# Patient Record
Sex: Female | Born: 1950 | Race: White | Hispanic: No | Smoking: Former smoker
Health system: Southern US, Community
[De-identification: ages and names within clinical notes are randomized; demographics above are authoritative.]

## PROBLEM LIST (undated history)

## (undated) DIAGNOSIS — E663 Overweight: Secondary | ICD-10-CM

## (undated) DIAGNOSIS — K3189 Other diseases of stomach and duodenum: Secondary | ICD-10-CM

## (undated) DIAGNOSIS — K219 Gastro-esophageal reflux disease without esophagitis: Secondary | ICD-10-CM

## (undated) DIAGNOSIS — I8390 Asymptomatic varicose veins of unspecified lower extremity: Secondary | ICD-10-CM

## (undated) DIAGNOSIS — R195 Other fecal abnormalities: Secondary | ICD-10-CM

## (undated) DIAGNOSIS — I219 Acute myocardial infarction, unspecified: Secondary | ICD-10-CM

## (undated) DIAGNOSIS — Z8742 Personal history of other diseases of the female genital tract: Secondary | ICD-10-CM

## (undated) DIAGNOSIS — T4145XA Adverse effect of unspecified anesthetic, initial encounter: Secondary | ICD-10-CM

## (undated) DIAGNOSIS — E785 Hyperlipidemia, unspecified: Secondary | ICD-10-CM

## (undated) DIAGNOSIS — IMO0002 Reserved for concepts with insufficient information to code with codable children: Secondary | ICD-10-CM

## (undated) DIAGNOSIS — K2970 Gastritis, unspecified, without bleeding: Secondary | ICD-10-CM

## (undated) DIAGNOSIS — M8580 Other specified disorders of bone density and structure, unspecified site: Secondary | ICD-10-CM

## (undated) DIAGNOSIS — T8859XA Other complications of anesthesia, initial encounter: Secondary | ICD-10-CM

## (undated) DIAGNOSIS — E079 Disorder of thyroid, unspecified: Secondary | ICD-10-CM

## (undated) DIAGNOSIS — H269 Unspecified cataract: Secondary | ICD-10-CM

## (undated) DIAGNOSIS — E119 Type 2 diabetes mellitus without complications: Secondary | ICD-10-CM

## (undated) DIAGNOSIS — E669 Obesity, unspecified: Secondary | ICD-10-CM

## (undated) DIAGNOSIS — M1990 Unspecified osteoarthritis, unspecified site: Secondary | ICD-10-CM

## (undated) DIAGNOSIS — D649 Anemia, unspecified: Secondary | ICD-10-CM

## (undated) DIAGNOSIS — I2510 Atherosclerotic heart disease of native coronary artery without angina pectoris: Secondary | ICD-10-CM

## (undated) HISTORY — DX: Personal history of other diseases of the female genital tract: Z87.42

## (undated) HISTORY — DX: Other specified disorders of bone density and structure, unspecified site: M85.80

## (undated) HISTORY — DX: Other diseases of stomach and duodenum: K31.89

## (undated) HISTORY — DX: Disorder of thyroid, unspecified: E07.9

## (undated) HISTORY — DX: Hyperlipidemia, unspecified: E78.5

## (undated) HISTORY — PX: UPPER GASTROINTESTINAL ENDOSCOPY: SHX188

## (undated) HISTORY — PX: ECTOPIC PREGNANCY SURGERY: SHX613

## (undated) HISTORY — DX: Overweight: E66.3

## (undated) HISTORY — PX: TONSILLECTOMY: SUR1361

## (undated) HISTORY — DX: Reserved for concepts with insufficient information to code with codable children: IMO0002

## (undated) HISTORY — PX: FIBULA FRACTURE SURGERY: SHX947

## (undated) HISTORY — DX: Gastritis, unspecified, without bleeding: K29.70

## (undated) HISTORY — PX: TUBAL LIGATION: SHX77

## (undated) HISTORY — PX: COLONOSCOPY: SHX174

## (undated) HISTORY — DX: Obesity, unspecified: E66.9

## (undated) HISTORY — PX: GANGLION CYST EXCISION: SHX1691

## (undated) HISTORY — PX: DILATION AND CURETTAGE OF UTERUS: SHX78

## (undated) HISTORY — DX: Asymptomatic varicose veins of unspecified lower extremity: I83.90

## (undated) HISTORY — DX: Acute myocardial infarction, unspecified: I21.9

## (undated) HISTORY — DX: Atherosclerotic heart disease of native coronary artery without angina pectoris: I25.10

## (undated) HISTORY — DX: Anemia, unspecified: D64.9

## (undated) HISTORY — DX: Unspecified osteoarthritis, unspecified site: M19.90

## (undated) HISTORY — DX: Gastro-esophageal reflux disease without esophagitis: K21.9

## (undated) HISTORY — DX: Type 2 diabetes mellitus without complications: E11.9

## (undated) HISTORY — DX: Other fecal abnormalities: R19.5

---
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
## 1998-06-13 ENCOUNTER — Other Ambulatory Visit: Admission: RE | Admit: 1998-06-13 | Discharge: 1998-06-13 | Payer: Self-pay | Admitting: Obstetrics & Gynecology

## 2001-06-24 ENCOUNTER — Encounter (HOSPITAL_COMMUNITY): Admission: RE | Admit: 2001-06-24 | Discharge: 2001-09-22 | Payer: Self-pay | Admitting: Cardiology

## 2001-09-23 ENCOUNTER — Encounter (HOSPITAL_COMMUNITY): Admission: RE | Admit: 2001-09-23 | Discharge: 2001-10-06 | Payer: Self-pay | Admitting: Cardiology

## 2001-10-07 ENCOUNTER — Encounter (HOSPITAL_COMMUNITY): Admission: RE | Admit: 2001-10-07 | Discharge: 2002-01-05 | Payer: Self-pay | Admitting: Cardiology

## 2002-02-05 HISTORY — PX: CLOSED REDUCTION PROXIMAL TIBIOFIBULAR JOINT DISLOCATON: SUR235

## 2002-03-11 ENCOUNTER — Inpatient Hospital Stay (HOSPITAL_COMMUNITY): Admission: EM | Admit: 2002-03-11 | Discharge: 2002-03-13 | Payer: Self-pay | Admitting: Emergency Medicine

## 2002-03-11 ENCOUNTER — Encounter: Payer: Self-pay | Admitting: Emergency Medicine

## 2002-03-11 ENCOUNTER — Encounter: Payer: Self-pay | Admitting: Cardiology

## 2002-03-11 ENCOUNTER — Encounter: Payer: Self-pay | Admitting: Orthopedic Surgery

## 2002-03-12 ENCOUNTER — Encounter: Payer: Self-pay | Admitting: Orthopedic Surgery

## 2003-04-03 ENCOUNTER — Emergency Department (HOSPITAL_COMMUNITY): Admission: EM | Admit: 2003-04-03 | Discharge: 2003-04-03 | Payer: Self-pay | Admitting: Emergency Medicine

## 2003-07-09 ENCOUNTER — Other Ambulatory Visit: Admission: RE | Admit: 2003-07-09 | Discharge: 2003-07-09 | Payer: Self-pay | Admitting: Family Medicine

## 2003-08-04 ENCOUNTER — Encounter: Admission: RE | Admit: 2003-08-04 | Discharge: 2003-11-02 | Payer: Self-pay | Admitting: Family Medicine

## 2004-09-15 ENCOUNTER — Encounter: Admission: RE | Admit: 2004-09-15 | Discharge: 2004-12-14 | Payer: Self-pay | Admitting: Family Medicine

## 2004-10-27 ENCOUNTER — Other Ambulatory Visit: Admission: RE | Admit: 2004-10-27 | Discharge: 2004-10-27 | Payer: Self-pay | Admitting: Family Medicine

## 2005-02-16 ENCOUNTER — Ambulatory Visit: Payer: Self-pay | Admitting: Cardiology

## 2005-02-28 ENCOUNTER — Encounter: Admission: RE | Admit: 2005-02-28 | Discharge: 2005-05-29 | Payer: Self-pay | Admitting: Family Medicine

## 2005-03-30 ENCOUNTER — Ambulatory Visit: Payer: Self-pay | Admitting: Cardiology

## 2005-04-20 ENCOUNTER — Ambulatory Visit: Payer: Self-pay

## 2005-06-28 ENCOUNTER — Encounter: Admission: RE | Admit: 2005-06-28 | Discharge: 2005-06-28 | Payer: Self-pay | Admitting: Family Medicine

## 2005-09-21 ENCOUNTER — Ambulatory Visit: Payer: Self-pay | Admitting: Cardiology

## 2005-09-28 ENCOUNTER — Encounter: Admission: RE | Admit: 2005-09-28 | Discharge: 2005-09-28 | Payer: Self-pay | Admitting: Family Medicine

## 2006-02-11 ENCOUNTER — Ambulatory Visit: Payer: Self-pay | Admitting: Cardiology

## 2007-02-21 ENCOUNTER — Ambulatory Visit: Payer: Self-pay | Admitting: Cardiology

## 2007-05-23 LAB — HM PAP SMEAR

## 2007-06-20 ENCOUNTER — Ambulatory Visit: Payer: Self-pay

## 2008-02-03 ENCOUNTER — Ambulatory Visit: Payer: Self-pay | Admitting: Cardiology

## 2008-02-03 LAB — CONVERTED CEMR LAB
ALT: 44 units/L — ABNORMAL HIGH (ref 0–35)
Albumin: 3.7 g/dL (ref 3.5–5.2)
BUN: 11 mg/dL (ref 6–23)
Basophils Absolute: 0 10*3/uL (ref 0.0–0.1)
Basophils Relative: 0.2 % (ref 0.0–3.0)
CO2: 30 meq/L (ref 19–32)
CRP, High Sensitivity: <1 — ABNORMAL LOW (ref 0.00–5.00)
Calcium: 9.6 mg/dL (ref 8.4–10.5)
Creatinine, Ser: 0.7 mg/dL (ref 0.4–1.2)
Eosinophils Absolute: 0.1 10*3/uL (ref 0.0–0.7)
Eosinophils Relative: 0.9 % (ref 0.0–5.0)
GFR calc non Af Amer: 92 mL/min
HCT: 39.4 % (ref 36.0–46.0)
Hemoglobin: 13.7 g/dL (ref 12.0–15.0)
MCHC: 34.9 g/dL (ref 30.0–36.0)
MCV: 93 fL (ref 78.0–100.0)
Neutro Abs: 3.8 10*3/uL (ref 1.4–7.7)
RBC: 4.23 M/uL (ref 3.87–5.11)
Total Bilirubin: 0.8 mg/dL (ref 0.3–1.2)
Total CK: 81 units/L (ref 7–177)

## 2008-04-14 DIAGNOSIS — E785 Hyperlipidemia, unspecified: Secondary | ICD-10-CM | POA: Insufficient documentation

## 2008-04-14 HISTORY — DX: Hyperlipidemia, unspecified: E78.5

## 2008-04-16 ENCOUNTER — Encounter: Payer: Self-pay | Admitting: Cardiology

## 2008-04-16 ENCOUNTER — Ambulatory Visit: Payer: Self-pay | Admitting: Cardiology

## 2008-04-30 ENCOUNTER — Ambulatory Visit: Payer: Self-pay | Admitting: Cardiology

## 2008-04-30 LAB — CONVERTED CEMR LAB
Albumin: 3.6 g/dL (ref 3.5–5.2)
Alkaline Phosphatase: 64 units/L (ref 39–117)
HDL: 35.5 mg/dL — ABNORMAL LOW (ref >39.00)
Total Protein: 6.6 g/dL (ref 6.0–8.3)
Triglycerides: 49 mg/dL (ref 0.0–149.0)
VLDL: 9.8 mg/dL (ref 0.0–40.0)

## 2008-07-23 ENCOUNTER — Encounter: Admission: RE | Admit: 2008-07-23 | Discharge: 2008-07-23 | Payer: Self-pay | Admitting: Family Medicine

## 2009-03-08 ENCOUNTER — Telehealth: Payer: Self-pay | Admitting: Cardiology

## 2009-03-08 ENCOUNTER — Encounter (INDEPENDENT_AMBULATORY_CARE_PROVIDER_SITE_OTHER): Payer: Self-pay | Admitting: *Deleted

## 2009-05-13 ENCOUNTER — Ambulatory Visit: Payer: Self-pay | Admitting: Cardiology

## 2009-05-13 DIAGNOSIS — I2510 Atherosclerotic heart disease of native coronary artery without angina pectoris: Secondary | ICD-10-CM

## 2009-05-13 DIAGNOSIS — E663 Overweight: Secondary | ICD-10-CM | POA: Insufficient documentation

## 2009-05-13 HISTORY — DX: Atherosclerotic heart disease of native coronary artery without angina pectoris: I25.10

## 2009-05-13 HISTORY — DX: Overweight: E66.3

## 2009-06-16 ENCOUNTER — Telehealth (INDEPENDENT_AMBULATORY_CARE_PROVIDER_SITE_OTHER): Payer: Self-pay

## 2009-06-17 ENCOUNTER — Encounter: Payer: Self-pay | Admitting: Cardiology

## 2009-06-20 ENCOUNTER — Ambulatory Visit: Payer: Self-pay

## 2009-06-20 ENCOUNTER — Encounter (HOSPITAL_COMMUNITY): Admission: RE | Admit: 2009-06-20 | Discharge: 2009-06-20 | Payer: Self-pay | Admitting: Cardiology

## 2009-06-20 ENCOUNTER — Ambulatory Visit: Payer: Self-pay | Admitting: Internal Medicine

## 2009-06-28 ENCOUNTER — Telehealth: Payer: Self-pay | Admitting: Cardiology

## 2009-12-09 ENCOUNTER — Ambulatory Visit: Payer: Self-pay | Admitting: Cardiology

## 2009-12-09 DIAGNOSIS — E119 Type 2 diabetes mellitus without complications: Secondary | ICD-10-CM

## 2009-12-09 HISTORY — DX: Type 2 diabetes mellitus without complications: E11.9

## 2009-12-12 ENCOUNTER — Telehealth: Payer: Self-pay | Admitting: Cardiology

## 2009-12-13 ENCOUNTER — Telehealth: Payer: Self-pay | Admitting: Cardiology

## 2009-12-15 ENCOUNTER — Telehealth: Payer: Self-pay | Admitting: Cardiology

## 2009-12-23 ENCOUNTER — Encounter (INDEPENDENT_AMBULATORY_CARE_PROVIDER_SITE_OTHER): Payer: Self-pay | Admitting: *Deleted

## 2010-01-18 ENCOUNTER — Telehealth: Payer: Self-pay | Admitting: Cardiology

## 2010-01-23 ENCOUNTER — Telehealth: Payer: Self-pay | Admitting: Internal Medicine

## 2010-02-05 DIAGNOSIS — K31A0 Gastric intestinal metaplasia, unspecified: Secondary | ICD-10-CM

## 2010-02-05 HISTORY — DX: Gastric intestinal metaplasia, unspecified: K31.A0

## 2010-02-23 ENCOUNTER — Encounter: Payer: Self-pay | Admitting: *Deleted

## 2010-02-24 DIAGNOSIS — D649 Anemia, unspecified: Secondary | ICD-10-CM | POA: Insufficient documentation

## 2010-02-24 DIAGNOSIS — K921 Melena: Secondary | ICD-10-CM | POA: Insufficient documentation

## 2010-02-24 DIAGNOSIS — Z8711 Personal history of peptic ulcer disease: Secondary | ICD-10-CM | POA: Insufficient documentation

## 2010-03-01 ENCOUNTER — Ambulatory Visit
Admission: RE | Admit: 2010-03-01 | Discharge: 2010-03-01 | Payer: Self-pay | Source: Home / Self Care | Attending: Internal Medicine | Admitting: Internal Medicine

## 2010-03-06 ENCOUNTER — Ambulatory Visit
Admission: RE | Admit: 2010-03-06 | Discharge: 2010-03-06 | Payer: Self-pay | Source: Home / Self Care | Attending: Internal Medicine | Admitting: Internal Medicine

## 2010-03-06 ENCOUNTER — Encounter: Payer: Self-pay | Admitting: Internal Medicine

## 2010-03-06 DIAGNOSIS — L709 Acne, unspecified: Secondary | ICD-10-CM | POA: Insufficient documentation

## 2010-03-06 DIAGNOSIS — L708 Other acne: Secondary | ICD-10-CM | POA: Insufficient documentation

## 2010-03-06 DIAGNOSIS — E663 Overweight: Secondary | ICD-10-CM

## 2010-03-06 DIAGNOSIS — K219 Gastro-esophageal reflux disease without esophagitis: Secondary | ICD-10-CM | POA: Insufficient documentation

## 2010-03-06 DIAGNOSIS — I2510 Atherosclerotic heart disease of native coronary artery without angina pectoris: Secondary | ICD-10-CM

## 2010-03-06 DIAGNOSIS — E119 Type 2 diabetes mellitus without complications: Secondary | ICD-10-CM

## 2010-03-06 DIAGNOSIS — E785 Hyperlipidemia, unspecified: Secondary | ICD-10-CM

## 2010-03-06 NOTE — Progress Notes (Signed)
  Subjective:    Patient ID: Morgan [name], female    DOB: [birth year], 60 y.o.   MRN: [id number] Pt comes in today to establish   .Marland Kitchen Last pcp was  Dr Larina Bras who recently left local practice and previously Dr Christ Kick. SHe is under the care of Dr Daleen Squibb for her CAD . She has diabetes well controlled  Elevated lipids.  No ongoing  cv symptoms but gets SBE prophylaxis .  And prn rx for adult acne.    Diabetes: She's been on medicine since 2005 based on the best with metformin and by 8. Has been able to lose weight and maintain control. I believe her last labs were in 6 months ago. She gets regular eye checks has no neuropathy but had some question of a numb toe on exam at one point. Coronary artery disease; MI at about age 40 drug alluding stent  up to date and no limitations or exercise-induced symptoms. Hyperlipidemia  on medications well controlled at goal without side effects lovaza is expensive and she was told she could take over-the-counter fish oil. Healthcare maintenance  she is unsure of her shots she hasn't had a Pap and a couple years has remote history of slightly abnormal cells. 1980 normal since then. HPI    Review of Systems  Constitutional: Negative.   HENT: Negative for hearing loss.   Eyes: Negative for visual disturbance.  Respiratory: Negative for cough, chest tightness, shortness of breath and wheezing.   Cardiovascular: Negative for chest pain, palpitations and leg swelling.  Gastrointestinal: Negative for abdominal pain.       Indigestion that she  Takes  tums qhs   To have endo soon.  Skin: Negative for rash.  Neurological: Negative for syncope and headaches.  Hematological: Negative for adenopathy. Does not bruise/bleed easily.       Objective:   Physical Exam  Constitutional: She is oriented to person, place, and time. She appears well-developed and well-nourished. No distress.  HENT:  Head: Normocephalic and atraumatic.  Left Ear: External ear normal.  Eyes:  Pupils are equal, round, and reactive to light.  Neck: Normal range of motion. Neck supple. Carotid bruit is not present. No mass and no thyromegaly present.  Cardiovascular: Normal rate, regular rhythm, normal heart sounds and intact distal pulses.  PMI is not displaced.  Exam reveals no gallop and no friction rub.   Pulmonary/Chest: Effort normal and breath sounds normal. She has no decreased breath sounds. She has no wheezes. She has no rhonchi. She has no rales.  Abdominal: Soft. Normal appearance and bowel sounds are normal. She exhibits no abdominal bruit and no mass. There is no splenomegaly or hepatomegaly. There is no tenderness.  Lymphadenopathy:    She has no cervical adenopathy.  Neurological: She is alert and oriented to person, place, and time. Coordination normal.  Skin: Skin is warm and dry.  Psychiatric: She has a normal mood and affect. Her behavior is normal.        Assessment & Plan:  Diabetes mellitus uncomplicated.2 Hyperlipidemia Coronary artery disease GE reflux disease Adult acne SBE prophylaxis   I'm unsure why she is on clindamycin before procedures and dental work. Family history of heart disease and diabetes.

## 2010-03-06 NOTE — Assessment & Plan Note (Signed)
Apparently well controlled on her current combination if she switches to over-the-counter fish oil she may need to take the equivalent of 8-12 a day GI side effects cautioned.

## 2010-03-06 NOTE — Assessment & Plan Note (Signed)
Apparently well-controlled on metformin and by age. There've been problems obtaining medication from her insurance and she has been paying out of pocket. Dr. wall and her previous doctors have tried to get this approved no avail. We'll write this prescription and see a prior authorization is helpful. She is apparently doing  well on this combination. She states she is up-to-date on her eye checks. We'll review the records she brought today to check other health maintenance issues.

## 2010-03-06 NOTE — Assessment & Plan Note (Signed)
Discussed symptoms Dr. Dickie La has apparently prescribed omeprazole but she hasn't had yet. She will be getting an endoscopy soon. Weight loss will help this problem.

## 2010-03-06 NOTE — Patient Instructions (Addendum)
Continue lifestyle intervention healthy eating and exercise .  Weight loss continued will help. Schedule for  CPX  With pap  and labs before  With this.   Include  Hg a1c and urine/ microalbumine /creatine  Ratio.   Take the omeprazole  As  Per  Dr Juanda Chance  .  Sometime tums causes rebound acid.  Get a mammogram . May need  A tdap   Pneumonia shot is recommended  for diabetics ad people with heart disease    Or at age 60. .      Will have to review the records she brought an updated her health care maintenance. We will order the Byetta and attempt to go through the process again and getting approval from her insurance. She is doing well and has lost a lot of weight it would be unfortunate adverse to her health and have to change from something that is working well.

## 2010-03-06 NOTE — Assessment & Plan Note (Signed)
Reviewed strategies.

## 2010-03-07 ENCOUNTER — Encounter: Payer: Self-pay | Admitting: Internal Medicine

## 2010-03-07 NOTE — Assessment & Plan Note (Signed)
Summary: WT 189/C R/S/WALL/414.01/UCH  PRC. REQ/SAF  Nuclear Med Background Indications for Stress Test: Evaluation for Ischemia, Stent Patency   History: Echo, Heart Catheterization, Myocardial Infarction, Myocardial Perfusion Study, Stents  History Comments: '03 IWMI>Cath>Stents- RCA x 3; '04 Echo: EF=55-65%; '09 MPS:no ischemia, EF=72%.   Symptoms Comments: No cardiac complaints.   Nuclear Pre-Procedure Cardiac Risk Factors: Family History - CAD, History of Smoking, Hypertension, Lipids, NIDDM, Obesity Caffeine/Decaff Intake: None NPO After: 7:00 PM Lungs: Clear.  O2 Sat 98% on RA IV 0.9% NS with Angio Cath: 22g     IV Site: (R) AC IV Started by: Burna Mortimer Deal RT-N Chest Size (in) 38     Cup Size D     Height (in): 66 Weight (lb): 186 BMI: 30.13  Nuclear Med Study 1 or 2 day study:  1 day     Stress Test Type:  Eugenie Birks Reading MD:  Dietrich Pates, MD     Referring MD:  Valera Castle, MD Resting Radionuclide:  Technetium 9m Tetrofosmin     Resting Radionuclide Dose:  10 mCi  Stress Radionuclide:  Technetium 84m Tetrofosmin     Stress Radionuclide Dose:  33 mCi   Stress Protocol   Lexiscan: 0.4 mg   Stress Test Technologist:  Rea [employer] CMA-N     Nuclear Technologist:  Domenic Polite CNMT  Rest Procedure  Myocardial perfusion imaging was performed at rest 45 minutes following the intravenous administration of Myoview Technetium 65m Tetrofosmin.  Stress Procedure  The patient received IV Lexiscan 0.4 mg over 15-seconds.  Myoview injected at 30-seconds.  There were no significant changes with lexiscan, other than a hypotensive response.  Quantitative spect images were obtained after a 45 minute delay.  QPS Raw Data Images:  Images were motion corrected.  Soft tissue (diaphragm, bowel activity) underlie heart. Stress Images:  Inferoseptal defect (base, mid).  Minimall apical thinning.    Ptjerwise normal perfusion. Rest Images:  Minimal improvement in the inferoseptal  region. Transient Ischemic Dilatation:  1.09  (Normal <1.22)  Lung/Heart Ratio:  .47  (Normal <0.45)  Quantitative Gated Spect Images QGS EDV:  97 ml QGS ESV:  27 ml QGS EF:  73 %   Overall Impression  Exercise Capacity: Lexiscan protocol. BP Response: Normal blood pressure response. Clinical Symptoms: Chest tightness. ECG Impression: No significant ST segment change suggestive of ischemia. Overall Impression Comments: Inferoseptal defect (base, mid) that improves minimally consistent with scar and very mild periinfarct ischemia.  LVEF 73% with normal wall thickening.  Appended Document: WT 189/C R/S/WALL/414.01/UCH  PRC. REQ/SAF Excellent results...no change in treatment.  Appended Document: WT 189/C R/S/WALL/414.01/UCH  PRC. REQ/SAF LMTCB./CY

## 2010-03-07 NOTE — Progress Notes (Signed)
Summary: pt wants to know if meds were approved.   Phone Note Call from Patient Call back at Home Phone [phone]   Caller: Patient Reason for Call: Talk to Nurse Summary of Call: pt wants to know if meds were approved. pt has not receive her meds. Initial call taken by: Roe Coombs,  December 15, 2009 9:14 AM  Follow-up for Phone Call        Pt aware byetta approval is pending per [employer].  She is very appreciative of our efforts. Mylo Red RN     Appended Document: pt wants to know if meds were approved. thanks for working on this so hard.  Reviewed Juanito Doom, MD

## 2010-03-07 NOTE — Progress Notes (Signed)
Summary: re pre auth    Phone Note Call from Patient   Caller: Patient [phone]  ok to leave msg Reason for Call: Talk to Nurse Summary of Call: byatta pre auth ? Initial call taken by: Glynda Jaeger,  December 12, 2009 1:45 PM  Follow-up for Phone Call        Form faxed to [employer].  Awaiting preauthorization. Pt is aware. Mylo Red RN

## 2010-03-07 NOTE — Letter (Signed)
Summary: New Patient letter  [facility]  [address] [place], [place] [address]   Phone: [phone]  Fax: [phone]       12/23/2009 MRN: [id number]  Morgan [name] [address] [place], [place]  [address]  Dear Morgan [name],  Welcome to the Gastroenterology Division at [facility].    You are scheduled to see Dr. Juanda Chance on 03/01/2010 at 9:15AM on the 3rd floor at [facility], [address]  We ask that you try to arrive at our office 15 minutes prior to your appointment time to allow for check-in.  We would like you to complete the enclosed self-administered evaluation form prior to your visit and bring it with you on the day of your appointment.  We will review it with you.  Also, please bring a complete list of all your medications or, if you prefer, bring the medication bottles and we will list them.  Please bring your insurance card so that we may make a copy of it.  If your insurance requires a referral to see a specialist, please bring your referral form from your primary care physician.  Co-payments are due at the time of your visit and may be paid by cash, check or credit card.     Your office visit will consist of a consult with your physician (includes a physical exam), any laboratory testing he/she may order, scheduling of any necessary diagnostic testing (e.g. x-ray, ultrasound, CT-scan), and scheduling of a procedure (e.g. Endoscopy, Colonoscopy) if required.  Please allow enough time on your schedule to allow for any/all of these possibilities.    If you cannot keep your appointment, please call [phone] to cancel or reschedule prior to your appointment date.  This allows [place] the opportunity to schedule an appointment for another patient in need of care.  If you do not cancel or reschedule by 5 p.m. the business day prior to your appointment date, you will be charged a $50.00 late cancellation/no-show fee.    Thank you for  choosing [facility] for your medical needs.  We appreciate the opportunity to care for you.  Please visit [place] at our website  to learn more about our practice.                     Sincerely,                                                             The Gastroenterology Division

## 2010-03-07 NOTE — Assessment & Plan Note (Signed)
Summary: rov  Medications Added VITAMIN D 1000 UNIT  TABS (CHOLECALCIFEROL) 2 by mouth daily MULTIVITAMINS   TABS (MULTIPLE VITAMIN) 1 by mouth daily      Allergies Added:    Primary Provider:  Ace Gins   History of Present Illness: Morgan [name] comes in today for followup of her coronary artery disease and risk factors. She continues to do remarkably well keep her weight off. She is exercising on a regular basis. She is having no angina or ischemic symptoms. Laboratory data from May for primary care reviewed. Lipids are at goal.  She's having a difficult time getting authorization for her Byetta, Lipitor, and Lovasa. We will try today to work with [employer] to accomplish this. They have made a big difference in her numbers. The Byetta also has help her lose significant weight.  The last stress test was in May of this year. EF was 73% with minimal inferior septal scar. Question of mild peri-infarct ischemia. Low risk study.  Clinical Reports Reviewed:  Nuclear Study:  06/20/2007:  Exercise capacity -  Adenosine with low level exercise  Blood Pressure -   Clinical Symptoms - No chest pain or dyspnea  ECG impression - No significant ST segment change suggestive or ischemia  Overall impression -  Probable normal perfusion and mild soft tissue attenuation (breast). No evidence of significant ischemia.  04/20/2005:  Exercise capacity -  Blood Pressure -  Clinical Symptoms -   ECG impression - No diagnostic ST chagnes to suggest ischemia by standard criteria  Overall impression -  normal stress nuclear study.   Current Medications (verified): 1)  Aspirin 81 Mg Tbec (Aspirin) .... Take One Tablet By Mouth Daily 2)  Zetia 10 Mg Tabs (Ezetimibe) .... Take One Tablet By Mouth Daily. 3)  Lipitor 40 Mg Tabs (Atorvastatin Calcium) .... Take One Tablet By Mouth Daily. 4)  Byetta 10 Mcg Pen 10 Mcg/0.59ml Soln (Exenatide) .... Once Daily 5)  Minocin 100 Mg Caps (Minocycline Hcl) ....  As Needed 6)  Metformin Hcl 500 Mg Tabs (Metformin Hcl) .... 2 Tabs Bid 7)  Lovaza 1 Gm Caps (Omega-3-Acid Ethyl Esters) .... 2 Tab Two Times A Day 8)  Vitamin D 1000 Unit  Tabs (Cholecalciferol) .... 2 By Mouth Daily 9)  Multivitamins   Tabs (Multiple Vitamin) .Marland Kitchen.. 1 By Mouth Daily  Allergies (verified): 1)  ! Pcn  Past History:  Past Medical History: Last updated: 05/13/2009 CAD, NATIVE VESSEL (ICD-414.01) HYPERLIPIDEMIA-MIXED (ICD-272.4) OBESITY-MORBID (>100') (ICD-278.01)  Past Surgical History: Last updated: 04/14/2008 D&C Displaced left fibula fracture (distal) with  lateral subluxation of talus -- 03/2002 C-section (2) (1) tube tied  Family History: Last updated: 04/14/2008 Family History of Alcoholism:  Family History of Cancer:  Family History of Coronary Artery Disease:  Family History of Diabetes:  Family History of Hypertension:   Social History: Last updated: 04/14/2008 Full Time Married  Tobacco Use - Former.  Alcohol Use - no Drug Use - no  Risk Factors: Smoking Status: quit (04/14/2008)  Review of Systems       negative history of present illness  Vital Signs:  Patient profile:   60 year old female Height:      66 inches Weight:      193 pounds BMI:     31.26 Pulse rate:   69 / minute Resp:     16 per minute BP sitting:   119 / 68  (right arm)  Vitals Entered By: Marrion Coy, CNA (December 09, 2009  9:43 AM)  Physical Exam  General:  obese.   Head:  normocephalic and atraumatic Eyes:  PERRLA/EOM intact; conjunctiva and lids normal. Neck:  Neck supple, no JVD. No masses, thyromegaly or abnormal cervical nodes. Lungs:  Clear bilaterally to auscultation and percussion. Heart:  regular rate and rhythm, normal S1-S2, no bruits Msk:  Back normal, normal gait. Muscle strength and tone normal. Pulses:  pulses normal in all 4 extremities Extremities:  No clubbing or cyanosis. Neurologic:  Alert and oriented x 3. Skin:  Intact without  lesions or rashes. Psych:  Normal affect.   Impression & Recommendations:  Problem # 1:  CAD, NATIVE VESSEL (ICD-414.01) Assessment Unchanged  Her updated medication list for this problem includes:    Aspirin 81 Mg Tbec (Aspirin) .Marland Kitchen... Take one tablet by mouth daily  Problem # 2:  HYPERLIPIDEMIA-MIXED (ICD-272.4) Assessment: Improved  Her updated medication list for this problem includes:    Zetia 10 Mg Tabs (Ezetimibe) .Marland Kitchen... Take one tablet by mouth daily.    Lipitor 40 Mg Tabs (Atorvastatin calcium) .Marland Kitchen... Take one tablet by mouth daily.    Lovaza 1 Gm Caps (Omega-3-acid ethyl esters) .Marland Kitchen... 2 tab two times a day  Problem # 3:  OVERWEIGHT/OBESITY (ICD-278.02) Assessment: Improved  Problem # 4:  DIABETES MELLITUS, TYPE II (ICD-250.00) Assessment: Improved  Her updated medication list for this problem includes:    Aspirin 81 Mg Tbec (Aspirin) .Marland Kitchen... Take one tablet by mouth daily    Byetta 10 Mcg Pen 10 Mcg/0.75ml Soln (Exenatide) ..... Once daily    Metformin Hcl 500 Mg Tabs (Metformin hcl) .Marland Kitchen... 2 tabs bid  Patient Instructions: 1)  Your physician recommends that you schedule a follow-up appointment in: May 2012 with Dr. Daleen Squibb 2)  Your physician recommends that you continue on your current medications as directed. Please refer to the Current Medication list given to you today.

## 2010-03-07 NOTE — Progress Notes (Signed)
Summary: Nuc. Pre-Procedure  Phone Note Outgoing Call Call back at [facility] Phone [phone]   Call placed by: Irean Hong, RN,  Jun 16, 2009 10:50 AM Summary of Call: Left message with information on Myoview Information Sheet (see scanned document for details).      Nuclear Med Background Indications for Stress Test: Evaluation for Ischemia   History: Echo, Heart Catheterization, Myocardial Infarction, Myocardial Perfusion Study, Stents  History Comments: '03 IWMI> Cath> Stents RCA x3. '04 Echo: EF=55-65%. 5/09 MPS: (-) ischemia, EF=72%.     Nuclear Pre-Procedure Cardiac Risk Factors: Family History - CAD, History of Smoking, Hypertension, Lipids, NIDDM, Obesity Height (in): 66

## 2010-03-07 NOTE — Miscellaneous (Signed)
Summary: update med  Clinical Lists Changes  Medications: Removed medication of FOLIC ACID   POWD (FOLIC ACID) once daily Added new medication of FOLIC ACID 1 MG TABS (FOLIC ACID) Take 1 tablet by mouth once a day

## 2010-03-07 NOTE — Assessment & Plan Note (Signed)
Summary: YEARLY/SL  Medications Added LOVAZA 1 GM CAPS (OMEGA-3-ACID ETHYL ESTERS) 2 tab two times a day FOLTX 2.5-25-2 MG TABS (FA-PYRIDOXINE-CYANCOBALAMIN) 1 tab once daily        Visit Morgan:  1 yr f/u Primary Provider:  Ace Gins  CC:  pt has lost 15 lb since last year due to being on Byetta for DM...edema/left ankle due to previous break..denies any cp or sob.  Morgan of Present Illness: Mrs. Morgan [name], Morgan [name], Morgan [name], Morgan [name], Morgan hypertension.  She has lost from 232 pounds now at 189. She says it is the Byetta.   She is now followed by Dr. Larina Bras. We receive labs which I reviewed with her today. Her total cholesterol 227, triglycerides 55, HDL is up to 52, LDL 64, thyroid profile normal, CBC normal, vitamin D level normal, blood sugar 100 the hemoglobin A1c is 6.0. LFTs are normal.  She's having no symptoms of angina or ischemia. She had her initial infarct back in 2004, she had no symptoms.  She denies orthopnea, PND or palpitations. She does not exercise on a regular basis but is very compliant with her medications.  Current Medications (verified): 1)  Aspirin 81 Mg Tbec (Aspirin) .... Take One Tablet By Mouth Daily 2)  Zetia 10 Mg Tabs (Ezetimibe) .... Take One Tablet By Mouth Daily. 3)  Lipitor 40 Mg Tabs (Atorvastatin Calcium) .... Take One Tablet By Mouth Daily. 4)  Byetta 10 Mcg Pen 10 Mcg/0.56ml Soln (Exenatide) .... Once Daily 5)  Minocin 100 Mg Caps (Minocycline Hcl) .... As Needed 6)  Metformin Hcl 500 Mg Tabs (Metformin Hcl) .... 2 Tabs Bid 7)  Lovaza 1 Gm Caps (Omega-3-Acid Ethyl Esters) .... 2 Tab Two Times A Day 8)  Vitamin D 1000 Unit  Tabs (Cholecalciferol) .Marland Kitchen.. 1 Tab Once Daily  Allergies: 1)  ! Pcn  Past Morgan:  Past Medical Morgan: Last updated: 05/13/2009 CAD, NATIVE VESSEL (ICD-414.01) [name]-MIXED (ICD-272.4) Morgan [name]-MORBID (>100')  (ICD-278.01)  Past Surgical Morgan: Last updated: 04/14/2008 D&C Displaced left fibula fracture (distal) with  lateral subluxation of talus -- 03/2002 C-section (2) (1) tube tied  Family Morgan: Last updated: 04/14/2008 Family Morgan of Alcoholism:  Family Morgan of Cancer:  Family Morgan of Coronary Artery [name]:  Family Morgan of [name]:  Family Morgan of Hypertension:   Social Morgan: Last updated: 04/14/2008 Full Time Married  Tobacco Use - Former.  Alcohol Use - no Drug Use - no  Risk Factors: Smoking Status: quit (04/14/2008)  Review of Systems       negative Morgan of present illness  Vital Signs:  Patient profile:   60 year old female Height:      66 inches Weight:      189 pounds BMI:     30.62 Pulse rate:   63 / minute Pulse rhythm:   irregular BP sitting:   118 / 60  (left arm) Cuff size:   large  Vitals Entered By: Danielle Rankin, CMA (May 13, 2009 9:33 AM)  Physical Exam  General:  Well developed, well nourished, in no acute distress.significant weight loss obvious Head:  normocephalic Morgan atraumatic Eyes:  PERRLA/EOM intact; conjunctiva Morgan lids normal. Mouth:  Teeth, gums Morgan palate normal. Oral mucosa normal. Neck:  Neck supple, no JVD. No masses, thyromegaly or abnormal cervical nodes. Chest Earla Charlie:  no deformities or breast masses noted Lungs:  Clear bilaterally to auscultation Morgan percussion. Heart:  Non-displaced PMI,  chest non-tender; regular rate Morgan rhythm, S1, S2 without murmurs, rubs or gallops. Carotid upstroke normal, no bruit. Normal abdominal aortic size, no bruits. Femorals normal pulses, no bruits. Pedals normal pulses. No edema, no varicosities. Abdomen:  Bowel sounds positive; abdomen soft Morgan non-tender without masses, organomegaly, or hernias noted. No hepatosplenomegaly. Msk:  Back normal, normal gait. Muscle strength Morgan tone normal. Pulses:  pulses normal in all 4 extremities Extremities:  No clubbing or  cyanosis. Neurologic:  Alert Morgan oriented x 3. Skin:  Intact without lesions or rashes. Psych:  Normal affect.   Problems:  Medical Problems Added: 1)  Dx of Overweight/Morgan [name]  (ICD-278.02)  EKG  Procedure date:  05/13/2009  Findings:      normal sinus rhythm, first degree AV block no change.  Impression & Recommendations:  Problem # 1:  CAD, NATIVE VESSEL (ICD-414.01)  She is doing remarkably well. We will obtain a stress Myoview since she had no symptoms prior to her MI. No change in treatment except stop folic acid. Her updated medication list for this problem includes:    Aspirin 81 Mg Tbec (Aspirin) .Marland Kitchen... Take one tablet by mouth daily  Orders: EKG w/ Interpretation (93000) Nuclear Stress Test (Nuc Stress Test)  Problem # 2:  [name]-MIXED (ICD-272.4) Assessment: Improved I reviewed her labs with her. She has excellent values. No change. Her updated medication list for this problem includes:    Zetia 10 Mg Tabs (Ezetimibe) .Marland Kitchen... Take one tablet by mouth daily.    Lipitor 40 Mg Tabs (Atorvastatin calcium) .Marland Kitchen... Take one tablet by mouth daily.    Lovaza 1 Gm Caps (Omega-3-acid ethyl esters) .Marland Kitchen... 2 tab two times a day  Problem # 3:  OVERWEIGHT/Morgan [name] (ICD-278.02) Assessment: Improved She has lost from 232-189. This is remarkable. Her numbers including her blood pressure Morgan hemoglobin A1c reflect this. Maintenance reinforced.  Problem # 4:  OVERWEIGHT/Morgan [name] (ICD-278.02)  Patient Instructions: 1)  Your physician recommends that you schedule a follow-up appointment in: YEAR WITH DR Everlee Quakenbush 2)  Your physician has recommended you make the following change in your medication: STOP FOLIC ACID 3)  Your physician has requested that you have an exercise stress myoview.  For further information please visit [email or web address]  Please follow instruction sheet, as given. Prescriptions: FOLTX 2.5-25-2 MG TABS (FA-PYRIDOXINE-CYANCOBALAMIN) 1 tab once daily  #9i0 x 3    Entered by:   Danielle Rankin, CMA   Authorized by:   Gaylord Shih, MD, [facility]   Signed by:   Danielle Rankin, CMA on 05/13/2009   Method used:   Faxed to ...       [facility] (mail-order)             , [place]         Ph:        Fax: [phone]   RxID:   [id number]

## 2010-03-07 NOTE — Progress Notes (Signed)
Summary: test results   Phone Note Call from Patient Call back at Home Phone [phone]   Caller: Patient Reason for Call: Talk to Nurse, Insurance Question Details for Reason: stress test. o.k. to leave message on voice mail Initial call taken by: Lorne Skeens,  Jun 28, 2009 9:05 AM  Follow-up for Phone Call        I left a detailed message on Dr Gordy Councilman voicemail in regards to her myoview results.   Follow-up by: Julieta Gutting, RN, BSN,  Jun 28, 2009 9:14 AM

## 2010-03-07 NOTE — Progress Notes (Signed)
Summary: refill   Phone Note Refill Request   Refills Requested: Medication #1:  ZETIA 10 MG TABS Take one tablet by mouth daily.   Supply Requested: 3 months  Medication #2:  LIPITOR 40 MG TABS Take one tablet by mouth daily.   Supply Requested: 3 months  Medication #3:  FOLIC ACID   POWD once daily   Supply Requested: 3 months  Medication #4:  LOVAZA 1 GM CAPS 2 tab qd   Supply Requested: 3 months [facility]   Method Requested: Fax to [facility] Initial call taken by: Migdalia Dk,  March 08, 2009 8:45 AM  Follow-up for Phone Call        Rx faxed to pharmacy Follow-up by: Vikki Ports,  March 08, 2009 8:57 AM    Prescriptions: LOVAZA 1 GM CAPS (OMEGA-3-ACID ETHYL ESTERS) 2 tab qd  #180 x 3   Entered by:   Vikki Ports   Authorized by:   Gaylord Shih, MD, [facility]   Signed by:   Vikki Ports on 03/08/2009   Method used:   Faxed to ...       [facility] (mail-order)             , [place]         Ph:        Fax: [phone]   RxID:   [id number] FOLIC ACID   POWD (FOLIC ACID) once daily  #90 x 3   Entered by:   Vikki Ports   Authorized by:   Gaylord Shih, MD, [facility]   Signed by:   Vikki Ports on 03/08/2009   Method used:   Faxed to ...       [facility] (mail-order)             , [place]         Ph:        Fax: [phone]   RxID:   [id number] LIPITOR 40 MG TABS (ATORVASTATIN CALCIUM) Take one tablet by mouth daily.  #90 x 3   Entered by:   Vikki Ports   Authorized by:   Gaylord Shih, MD, [facility]   Signed by:   Vikki Ports on 03/08/2009   Method used:   Faxed to ...       [facility] (mail-order)             , [place]         Ph:        Fax: [phone]   RxID:   [id number] ZETIA 10 MG TABS (EZETIMIBE) Take one tablet by mouth daily.  #90 x 3   Entered by:   Vikki Ports   Authorized by:   Gaylord Shih, MD, [facility]   Signed by:   Vikki Ports on 03/08/2009   Method used:   Faxed to ...       [facility] (mail-order)          , [place]         Ph:        Fax: [phone]   RxID:   [id number]

## 2010-03-07 NOTE — Progress Notes (Signed)
Summary: calling about meds   Phone Note Call from Patient Call back at Home Phone [phone]   Caller: Patient Summary of Call: pt calling regarding her medication Initial call taken by: Judie Grieve,  December 13, 2009 1:37 PM  Follow-up for Phone Call        pt calling back re problem wiht medication-pls call [phone] Glynda Jaeger  December 14, 2009 10:00 AM adv pt will try and track down info regarding her byatta. Claris Gladden, RN, BSN Follow-up by: Claris Gladden RN,  December 14, 2009 10:53 AM  Additional Follow-up for Phone Call Additional follow up Details #1::        spoke with [facility] health care regarding [facility] need medical records, A1c, and information about pt trying other DM meds. Left message for pt to call and I will provide info we have to [place].  Additional Follow-up by: Claris Gladden RN,  December 14, 2009 12:28 PM    Additional Follow-up for Phone Call Additional follow up Details #2::    12/14/09 1314 pt will call her Family Practice (Dr. Ace Gins) to contact us to discuss info needed. Per pt she has been on this med for 3 years and because of insurance change in July this has caused the problem in getting med fulfilled.   12/14/09 1327 spoke w/Angie at Dr. Hayden Rasmussen office and she will fax the info that is needed to get the authorization to [place] today.  Follow-up by: Claris Gladden RN,  December 14, 2009 1:30 PM  Additional Follow-up for Phone Call Additional follow up Details #3:: Details for Additional Follow-up Action Taken: LM on personal machine regarding refaxing forms and speaking with Tedd Sias again on Monday twice. Mylo Red RN

## 2010-03-09 NOTE — Assessment & Plan Note (Addendum)
Summary: BLOOD IN STOOL X 1 WEEK...AS.    History of Present Illness Visit Type: Initial Consult Primary GI MD: [name] MD Primary Provider: Ace Gins Requesting Provider: Ace Gins, MD Chief Complaint: blood in stool in Oct. 2011, GERD, constipation History of Present Illness:   This is a 5 white female with intermittent painless rectal bleeding described as pinkish, slimy, bloody stool for the past several months occurring intermittently without any abdominal pain. There is no family history of colon cancer. She tends to be constipated but does not take any laxatives. There is a history of a duodenal ulcer while she was in college. She has had chronic dyspepsia and indigestion and uses a lot of TUMS. She is followed by Dr. Daleen Squibb for coronary artery disease and has a normal ejection fraction of 73%. She is overweight.   GI Review of Systems    Reports acid reflux and  belching.      Denies abdominal pain, bloating, chest pain, dysphagia with liquids, dysphagia with solids, heartburn, loss of appetite, nausea, vomiting, vomiting blood, weight loss, and  weight gain.      Reports constipation, hemorrhoids, and  rectal bleeding.     Denies anal fissure, black tarry stools, change in bowel habit, diarrhea, diverticulosis, fecal incontinence, heme positive stool, irritable bowel syndrome, jaundice, light color stool, liver problems, and  rectal pain.    Current Medications (verified): 1)  Aspirin 81 Mg Tbec (Aspirin) .... Take One Tablet By Mouth Daily 2)  Zetia 10 Mg Tabs (Ezetimibe) .... Take One Tablet By Mouth Daily. 3)  Lipitor 40 Mg Tabs (Atorvastatin Calcium) .... Take One Tablet By Mouth Daily. 4)  Byetta 10 Mcg Pen 10 Mcg/0.59ml Soln (Exenatide) .... Once Daily 5)  Minocin 100 Mg Caps (Minocycline Hcl) .... As Needed 6)  Metformin Hcl 500 Mg Tabs (Metformin Hcl) .... 2 Tabs Bid 7)  Fish Oil 1000 Mg Caps (Omega-3 Fatty Acids) .... Take 2 Capsules By Mouth Two Times A  Day 8)  Vitamin D 1000 Unit  Tabs (Cholecalciferol) .... 2 By Mouth Daily 9)  Multivitamins   Tabs (Multiple Vitamin) .Marland Kitchen.. 1 By Mouth Daily 10)  Clindamycin Hcl 150 Mg Caps (Clindamycin Hcl) .... Taken Only For Dental Procedure  Allergies (verified): 1)  Pcn  Past History:  Past Medical History: Reviewed history from 05/13/2009 and no changes required. CAD, NATIVE VESSEL (ICD-414.01) HYPERLIPIDEMIA-MIXED (ICD-272.4) OBESITY-MORBID (>100') (ICD-278.01)  Past Surgical History: Reviewed history from 04/14/2008 and no changes required. D&C Displaced left fibula fracture (distal) with  lateral subluxation of talus -- 03/2002 C-section (2) (1) tube tied  Family History: Reviewed history from 02/24/2010 and no changes required. Family History of Alcoholism:  Family History of Cancer:  Family History of Coronary Artery Disease: Mother, Father, Grandparent Family History of Diabetes: Sibling, Grandparent Family History of Hypertension:   Social History: Reviewed history from 02/24/2010 and no changes required. Full Time Counseler Married  Tobacco Use - Former. -stopped over 15 years ago Alcohol Use - no Drug Use - no Patient does not get regular exercise.   Review of Systems  The patient denies allergy/sinus, anemia, anxiety-new, arthritis/joint pain, back pain, blood in urine, breast changes/lumps, change in vision, confusion, cough, coughing up blood, depression-new, fainting, fatigue, fever, headaches-new, hearing problems, heart murmur, heart rhythm changes, itching, menstrual pain, muscle pains/cramps, night sweats, nosebleeds, pregnancy symptoms, shortness of breath, skin rash, sleeping problems, sore throat, swelling of feet/legs, swollen lymph glands, thirst - excessive , urination - excessive , urination  changes/pain, urine leakage, vision changes, and voice change.         Pertinent positive and negative review of systems were noted in the above HPI. All other ROS was  otherwise negative.   Vital Signs:  Patient profile:   60 year old female Height:      64.5 inches Weight:      193.50 pounds BMI:     32.82 Pulse rate:   76 / minute Pulse rhythm:   regular BP sitting:   100 / 66  (left arm) Cuff size:   regular  Vitals Entered By: June McMurray CMA [facility] Dull) (March 01, 2010 9:22 AM)  Physical Exam  General:  Well developed, well nourished, no acute distress. Eyes:  PERRLA, no icterus. Mouth:  No deformity or lesions, dentition normal. Neck:  Supple; no masses or thyromegaly. Lungs:  Clear throughout to auscultation. Heart:  Regular rate and rhythm; no murmurs, rubs,  or bruits. Abdomen:  soft mildly obese abdomen, normoactive bowel sounds. Nontender. Liver edge at costal margin. Lower abdomen unremarkable. No scars. Rectal:  rectal and anoscopic exam reveals normal perianal area. Normal rectal sphincter and anal canal. No evidence of internal or external hemorrhoids. Small amount of Hemoccult negative stool in the ampulla. Extremities:  No clubbing, cyanosis, edema or deformities noted. Skin:  Intact without significant lesions or rashes. Psych:  Alert and cooperative. Normal mood and affect.   Impression & Recommendations:  Problem # 1:  BLOOD IN STOOL (ICD-578.1) Patient has intermittent painless hematochezia without obvious source of bleeding. I cannot reproduce any blood in her stool today and she has no visible hemorrhoids internally or externally. At her age of 17, she will need a colonoscopy to rule out neoplasm.  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 We need to rule out H. pylori. She would like to have the endoscopy at the time of colonoscopy to rule out pathology.  Patient Instructions: 1)  We have sent a prescription to [facility] for Prilosec 20 mg daily. 2)  You should be scheduled for evaluation of rectal bleeding and for screening with a  colonoscopy. 3)  You should also bee scheduled for an upper endoscopy for chronic dyspepsia. 4)  Copy sent to : Dr Fabian Sharp 5)  The medication list was reviewed and reconciled.  All changed / newly prescribed medications were explained.  A complete medication list was provided to the patient / caregiver. Prescriptions: OMEPRAZOLE 20 MG CPDR (OMEPRAZOLE) Take 1 tablet by mouth once a day  #90 x 0   Entered by:   Lamona Curl CMA (AAMA)   Authorized by:   Hart Carwin MD   Signed by:   Lamona Curl CMA (AAMA) on 03/01/2010   Method used:   Electronically to        [facility] (retail)             ,          Ph: [phone]       Fax: [phone]   RxID:   [id number]

## 2010-03-09 NOTE — Progress Notes (Signed)
Summary: work in [employer] from Patient Call back at [employer] [phone]   Caller: Patient Summary of Call: DR WALL recommended dr Fabian Sharp due to current md will close practice in jan 2012. Pt prefers female doc. Pt needs ov before march 2011 due to health issues Initial call taken by: Heron Sabins,  January 23, 2010 12:29 PM  Follow-up for Phone Call        Per Dr. Fabian Sharp- Okay to 03/06/10 at 1:45 and block the 2pm Follow-up by: Romualdo Bolk, CMA Duncan Dull),  February 01, 2010 5:19 PM  Additional Follow-up for Phone Call Additional follow up Details #1::         lmom/pt is aware of ov Additional Follow-up by: Heron Sabins,  February 02, 2010 9:12 AM

## 2010-03-09 NOTE — Progress Notes (Signed)
Summary: referral   Phone Note Call from Patient Call back at Home Phone [phone]   Caller: Patient Reason for Call: Talk to Nurse Summary of Call: pt states she needs a referral for pcp. pt wants a female pcp. Initial call taken by: Roe Coombs,  January 18, 2010 9:55 AM  Follow-up for Phone Call        Left message for pt (Dr Sherrine Maples) to call back. [place]--Dr Felicity Coyer [place]--Dr Panosh [place]--Dr Laury Axon, Dr Tobey Bride, RN, BSN  January 18, 2010 10:12 AM  I spoke with the pt and made her aware of some of our female physicians in Primary Care. The pt said that Dr Daleen Squibb had recommended a physician about a year ago and she wanted to know who he would recommend for her.  The pt does want a female PCP and she does not really care about location.  She would like a physician who will colaborate in her care not dictate. I will forward this message to Debbie RN to review with Dr Daleen Squibb.  Follow-up by: Julieta Gutting, RN, BSN,  January 18, 2010 12:14 PM  Additional Follow-up for Phone Call Additional follow up Details #1::        I recommend Dr Fabian Sharp. Additional Follow-up by: Gaylord Shih, MD, [facility],  January 19, 2010 2:51 PM     Appended Document: referral Information left on secure personal voicemail of Dr. Sherrine Maples. Mylo Red RN

## 2010-03-15 NOTE — Assessment & Plan Note (Signed)
Summary: to be est/okper doc/njr  see note in epic  Allergies: 1)  Pcn   Complete Medication List: 1)  Aspirin 81 Mg Tbec (Aspirin) .... Take one tablet by mouth daily 2)  Zetia 10 Mg Tabs (Ezetimibe) .... Take one tablet by mouth daily. 3)  Lipitor 40 Mg Tabs (Atorvastatin calcium) .... Take one tablet by mouth daily. 4)  Byetta 10 Mcg Pen 10 Mcg/0.65ml Soln (Exenatide) .... Two times a day 5)  Minocin 100 Mg Caps (Minocycline hcl) .... As needed 6)  Metformin Hcl 500 Mg Tabs (Metformin hcl) .... 2 tabs bid 7)  Fish Oil 1000 Mg Caps (Omega-3 fatty acids) .... Take 2 capsules by mouth two times a day 8)  Vitamin D 1000 Unit Tabs (Cholecalciferol) .... 2 by mouth daily 9)  Multivitamins Tabs (Multiple vitamin) .Marland Kitchen.. 1 by mouth daily 10)  Clindamycin Hcl 150 Mg Caps (Clindamycin hcl) .... Taken only for dental procedure take 4 11)  Omeprazole 20 Mg Cpdr (Omeprazole) .... Take 1 tablet by mouth once a day 12)  Onetouch Ultra Blue Strp (Glucose blood) .... Use 1 daily 13)  Onetouch Ultrasoft Lancets Misc (Lancets) .... Use 1 daily  Patient Instructions: 1)   see Epic note schedule checkup  with labs refill medications. Prescriptions: ONETOUCH ULTRASOFT LANCETS  MISC (LANCETS) use 1 daily  #90 x 3   Entered by:   Romualdo Bolk, CMA (AAMA)   Authorized by:   Madelin Headings MD   Signed by:   Romualdo Bolk, CMA (AAMA) on 03/06/2010   Method used:   Electronically to        [facility]* (retail)             ,          Ph: [phone]       Fax: [phone]   RxID:   [id number] ONETOUCH ULTRA BLUE  STRP (GLUCOSE BLOOD) use 1 daily  #90 x 3   Entered by:   Romualdo Bolk, CMA (AAMA)   Authorized by:   Madelin Headings MD   Signed by:   Romualdo Bolk, CMA (AAMA) on 03/06/2010   Method used:   Electronically to        [facility]* (retail)             ,          Ph: [phone]       Fax: [phone]   RxID:   [id number] BYETTA 10 MCG PEN 10  MCG/0.04ML SOLN (EXENATIDE) two times a day  #90 days x 3   Entered by:   Romualdo Bolk, CMA (AAMA)   Authorized by:   Madelin Headings MD   Signed by:   Romualdo Bolk, CMA (AAMA) on 03/06/2010   Method used:   Electronically to        [facility]* (retail)             ,          Ph: [phone]       Fax: [phone]   RxID:   [id number] CLINDAMYCIN HCL 150 MG CAPS (CLINDAMYCIN HCL) taken only for dental procedure take 4  #12 x 0   Entered and Authorized by:   Madelin Headings MD   Signed by:   Madelin Headings MD on 03/06/2010   Method used:   Electronically to        [facility]. [address] [phone]* (retail)  [address]       [place], [place]  [address]       Ph: [phone] or [phone]       Fax: [phone]   RxID:   [id number]  Subjective: Patient ID: Morgan [name], female DOB: 09/29/1950, 60 y.o. MRN: [id number]  Pt comes in today to establish .Marland Kitchen Last pcp was Dr Larina Bras who recently left local practice and previously Dr Christ Kick.  SHe is under the care of Dr Daleen Squibb for her CAD . She has diabetes well controlled Elevated lipids. No ongoing cv symptoms but gets SBE prophylaxis . And prn rx for adult acne. Diabetes: She's been on medicine since 2005 based on the best with metformin and by 8. Has been able to lose weight and maintain control. I believe her last labs were in 6 months ago. She gets regular eye checks has no neuropathy but had some question of a numb toe on exam at one point.  Coronary artery disease; MI at about age 89 drug alluding stent up to date and no limitations or exercise-induced symptoms.  Hyperlipidemia on medications well controlled at goal without side effects lovaza is expensive and she was told she could take over-the-counter fish oil.  Healthcare maintenance she is unsure of her shots she hasn't had a Pap and a couple years has remote history of slightly abnormal cells. 1980 normal since then.  HPI  Review of  Systems Constitutional: Negative. HENT: Negative for hearing loss. Eyes: Negative for visual disturbance. Respiratory: Negative for cough, chest tightness, shortness of breath and wheezing. Cardiovascular: Negative for chest pain, palpitations and leg swelling. Gastrointestinal: Negative for abdominal pain. Indigestion that she Takes tums qhs To have endo soon. Skin: Negative for rash. Neurological: Negative for syncope and headaches. Hematological: Negative for adenopathy. Does not bruise/bleed easily.   Objective: Physical Exam Constitutional: She is oriented to person, place, and time. She appears well-developed and well-nourished. No distress. HENT: Head: Normocephalic and atraumatic. Left Ear: External ear normal. Eyes: Pupils are equal, round, and reactive to light. Neck: Normal range of motion. Neck supple. Carotid bruit is not present. No mass and no thyromegaly present. Cardiovascular: Normal rate, regular rhythm, normal heart sounds and intact distal pulses. PMI is not displaced. Exam reveals no gallop and no friction rub. Pulmonary/Chest: Effort normal and breath sounds normal. She has no decreased breath sounds. She has no wheezes. She has no rhonchi. She has no rales. Abdominal: Soft. Normal appearance and bowel sounds are normal. She exhibits no abdominal bruit and no mass. There is no splenomegaly or hepatomegaly. There is no tenderness. Lymphadenopathy: She has no cervical adenopathy. Neurological: She is alert and oriented to person, place, and time. Coordination normal. Skin: Skin is warm and dry. Psychiatric: She has a normal mood and affect. Her behavior is normal.  Assessment & Plan: Diabetes mellitus uncomplicated.2  Hyperlipidemia  Coronary artery disease  GE reflux disease  Adult acne  SBE prophylaxis I'm unsure why she is on clindamycin before procedures and dental work.  Family history of heart disease and diabetes.   Orders Added: 1)  New Patient Level  IV [99204]   Subjective: Patient ID: Morgan [name], female DOB: 1950/11/12, 60 y.o. MRN: [id number]  Pt comes in today to establish .Marland Kitchen Last pcp was Dr Larina Bras who recently left local practice and previously Dr Christ Kick.  SHe is under the care of Dr Daleen Squibb for her CAD . She has diabetes well  controlled Elevated lipids. No ongoing cv symptoms but gets SBE prophylaxis . And prn rx for adult acne. Diabetes: She's been on medicine since 2005 based on the best with metformin and by 8. Has been able to lose weight and maintain control. I believe her last labs were in 6 months ago. She gets regular eye checks has no neuropathy but had some question of a numb toe on exam at one point.  Coronary artery disease; MI at about age 75 drug alluding stent up to date and no limitations or exercise-induced symptoms.  Hyperlipidemia on medications well controlled at goal without side effects lovaza is expensive and she was told she could take over-the-counter fish oil.  Healthcare maintenance she is unsure of her shots she hasn't had a Pap and a couple years has remote history of slightly abnormal cells. 1980 normal since then.  HPI  Review of Systems Constitutional: Negative. HENT: Negative for hearing loss. Eyes: Negative for visual disturbance. Respiratory: Negative for cough, chest tightness, shortness of breath and wheezing. Cardiovascular: Negative for chest pain, palpitations and leg swelling. Gastrointestinal: Negative for abdominal pain. Indigestion that she Takes tums qhs To have endo soon. Skin: Negative for rash. Neurological: Negative for syncope and headaches. Hematological: Negative for adenopathy. Does not bruise/bleed easily.   Objective: Physical Exam Constitutional: She is oriented to person, place, and time. She appears well-developed and well-nourished. No distress. HENT: Head: Normocephalic and atraumatic. Left Ear: External ear normal. Eyes: Pupils are equal, round, and reactive to  light. Neck: Normal range of motion. Neck supple. Carotid bruit is not present. No mass and no thyromegaly present. Cardiovascular: Normal rate, regular rhythm, normal heart sounds and intact distal pulses. PMI is not displaced. Exam reveals no gallop and no friction rub. Pulmonary/Chest: Effort normal and breath sounds normal. She has no decreased breath sounds. She has no wheezes. She has no rhonchi. She has no rales. Abdominal: Soft. Normal appearance and bowel sounds are normal. She exhibits no abdominal bruit and no mass. There is no splenomegaly or hepatomegaly. There is no tenderness. Lymphadenopathy: She has no cervical adenopathy. Neurological: She is alert and oriented to person, place, and time. Coordination normal. Skin: Skin is warm and dry. Psychiatric: She has a normal mood and affect. Her behavior is normal.  Assessment & Plan: Diabetes mellitus uncomplicated.2  Hyperlipidemia  Coronary artery disease  GE reflux disease  Adult acne  SBE prophylaxis I'm unsure why she is on clindamycin before procedures and dental work.  Family history of heart disease and diabetes. Subjective: Patient ID: Morgan [name], female DOB: 1950-07-01, 60 y.o. MRN: [id number]  Pt comes in today to establish .Marland Kitchen Last pcp was Dr Larina Bras who recently left local practice and previously Dr Christ Kick.  SHe is under the care of Dr Daleen Squibb for her CAD . She has diabetes well controlled Elevated lipids. No ongoing cv symptoms but gets SBE prophylaxis . And prn rx for adult acne. Diabetes: She's been on medicine since 2005 based on the best with metformin and by 8. Has been able to lose weight and maintain control. I believe her last labs were in 6 months ago. She gets regular eye checks has no neuropathy but had some question of a numb toe on exam at one point.  Coronary artery disease; MI at about age 83 drug alluding stent up to date and no limitations or exercise-induced symptoms.  Hyperlipidemia on medications well  controlled at goal without side effects lovaza is expensive and she was told  she could take over-the-counter fish oil.  Healthcare maintenance she is unsure of her shots she hasn't had a Pap and a couple years has remote history of slightly abnormal cells. 1980 normal since then.  HPI  Review of Systems Constitutional: Negative. HENT: Negative for hearing loss. Eyes: Negative for visual disturbance. Respiratory: Negative for cough, chest tightness, shortness of breath and wheezing. Cardiovascular: Negative for chest pain, palpitations and leg swelling. Gastrointestinal: Negative for abdominal pain. Indigestion that she Takes tums qhs To have endo soon. Skin: Negative for rash. Neurological: Negative for syncope and headaches. Hematological: Negative for adenopathy. Does not bruise/bleed easily.   Objective: Physical Exam Constitutional: She is oriented to person, place, and time. She appears well-developed and well-nourished. No distress. HENT: Head: Normocephalic and atraumatic. Left Ear: External ear normal. Eyes: Pupils are equal, round, and reactive to light. Neck: Normal range of motion. Neck supple. Carotid bruit is not present. No mass and no thyromegaly present. Cardiovascular: Normal rate, regular rhythm, normal heart sounds and intact distal pulses. PMI is not displaced. Exam reveals no gallop and no friction rub. Pulmonary/Chest: Effort normal and breath sounds normal. She has no decreased breath sounds. She has no wheezes. She has no rhonchi. She has no rales. Abdominal: Soft. Normal appearance and bowel sounds are normal. She exhibits no abdominal bruit and no mass. There is no splenomegaly or hepatomegaly. There is no tenderness. Lymphadenopathy: She has no cervical adenopathy. Neurological: She is alert and oriented to person, place, and time. Coordination normal. Skin: Skin is warm and dry. Psychiatric: She has a normal mood and affect. Her behavior is normal.  Assessment  & Plan: Diabetes mellitus uncomplicated.2  Hyperlipidemia  Coronary artery disease  GE reflux disease  Adult acne  SBE prophylaxis I'm unsure why she is on clindamycin before procedures and dental work.  Family history of heart disease and diabetes.

## 2010-03-21 ENCOUNTER — Telehealth: Payer: Self-pay | Admitting: Internal Medicine

## 2010-03-21 MED ORDER — EXENATIDE 10 MCG/0.04ML ~~LOC~~ SOPN: 10.0000 ug | PEN_INJECTOR | Freq: Two times a day (BID) | SUBCUTANEOUS | Status: DC

## 2010-03-21 NOTE — Telephone Encounter (Signed)
Please fax refill for Byetta 10 mcg to [facility] @ [phone].    was approved by [facility] from 03-17-2010 through 03-18-2011.

## 2010-03-22 ENCOUNTER — Other Ambulatory Visit: Payer: Self-pay | Admitting: Internal Medicine

## 2010-03-22 DIAGNOSIS — Z1231 Encounter for screening mammogram for malignant neoplasm of breast: Secondary | ICD-10-CM

## 2010-03-24 ENCOUNTER — Ambulatory Visit
Admission: RE | Admit: 2010-03-24 | Discharge: 2010-03-24 | Disposition: A | Discharge status: Home / Self Care | Payer: PRIVATE HEALTH INSURANCE | Source: Ambulatory Visit | Attending: Internal Medicine | Admitting: Internal Medicine

## 2010-03-24 DIAGNOSIS — Z1231 Encounter for screening mammogram for malignant neoplasm of breast: Secondary | ICD-10-CM

## 2010-03-30 ENCOUNTER — Other Ambulatory Visit: Payer: Self-pay | Admitting: Internal Medicine

## 2010-03-30 ENCOUNTER — Other Ambulatory Visit: Payer: PRIVATE HEALTH INSURANCE

## 2010-03-30 ENCOUNTER — Telehealth: Payer: Self-pay | Admitting: *Deleted

## 2010-03-30 ENCOUNTER — Encounter (INDEPENDENT_AMBULATORY_CARE_PROVIDER_SITE_OTHER): Payer: Self-pay | Admitting: *Deleted

## 2010-03-30 DIAGNOSIS — Z0000 Encounter for general adult medical examination without abnormal findings: Secondary | ICD-10-CM

## 2010-03-30 LAB — LIPID PANEL
Cholesterol: 131 mg/dL (ref 0–200)
HDL: 44.3 mg/dL (ref >39.00)
LDL Cholesterol: 76 mg/dL (ref 0–99)
Total CHOL/HDL Ratio: 3
Triglycerides: 53 mg/dL (ref 0.0–149.0)

## 2010-03-30 LAB — URINALYSIS, ROUTINE W REFLEX MICROSCOPIC
Nitrite: NEGATIVE
Specific Gravity, Urine: <=1.005 (ref 1.000–1.030)
Total Protein, Urine: NEGATIVE
Urine Glucose: NEGATIVE
Urobilinogen, UA: 0.2 (ref 0.0–1.0)

## 2010-03-30 LAB — CBC WITH DIFFERENTIAL/PLATELET
Basophils Absolute: 0 10*3/uL (ref 0.0–0.1)
Basophils Relative: 0.3 % (ref 0.0–3.0)
Eosinophils Absolute: 0 10*3/uL (ref 0.0–0.7)
Lymphocytes Relative: 13.8 % (ref 12.0–46.0)
MCHC: 33.9 g/dL (ref 30.0–36.0)
MCV: 94.2 fl (ref 78.0–100.0)
Monocytes Absolute: 0.6 10*3/uL (ref 0.1–1.0)
Neutrophils Relative %: 78.8 % — ABNORMAL HIGH (ref 43.0–77.0)
Platelets: 189 10*3/uL (ref 150.0–400.0)
RBC: 4.42 Mil/uL (ref 3.87–5.11)
RDW: 12.7 % (ref 11.5–14.6)

## 2010-03-30 LAB — MICROALBUMIN / CREATININE URINE RATIO: Creatinine,U: 36.9 mg/dL

## 2010-03-30 LAB — BASIC METABOLIC PANEL
BUN: 12 mg/dL (ref 6–23)
CO2: 30 mEq/L (ref 19–32)
Calcium: 9.4 mg/dL (ref 8.4–10.5)
Chloride: 102 mEq/L (ref 96–112)
Creatinine, Ser: 0.6 mg/dL (ref 0.4–1.2)
Glucose, Bld: 105 mg/dL — ABNORMAL HIGH (ref 70–99)

## 2010-03-30 LAB — HEPATIC FUNCTION PANEL
Alkaline Phosphatase: 77 U/L (ref 39–117)
Bilirubin, Direct: 0.2 mg/dL (ref 0.0–0.3)
Total Bilirubin: 0.8 mg/dL (ref 0.3–1.2)
Total Protein: 6.7 g/dL (ref 6.0–8.3)

## 2010-03-30 NOTE — Telephone Encounter (Signed)
Pharmacist needs clarification for byetta dosage instructions.

## 2010-03-30 NOTE — Telephone Encounter (Signed)
Called [facility] and gave them the directions.

## 2010-03-31 ENCOUNTER — Telehealth: Payer: Self-pay | Admitting: Internal Medicine

## 2010-03-31 MED ORDER — EXENATIDE 10 MCG/0.04ML ~~LOC~~ SOPN: PEN_INJECTOR | SUBCUTANEOUS | Status: DC

## 2010-03-31 NOTE — Telephone Encounter (Signed)
rx resent to pharmacy

## 2010-03-31 NOTE — Telephone Encounter (Signed)
[facility] pharmacy spoke to pt re: byetta dosage and instructions. Pt is upset because she says that she does not take the dosage of med that was sent in. [facility] pharmacy is req that nurse call back to clarify dosage again and instructions.

## 2010-04-03 ENCOUNTER — Encounter: Payer: Self-pay | Admitting: Internal Medicine

## 2010-04-13 NOTE — Letter (Signed)
Summary: Pre Visit Letter Revised  [facility]  [address] [place], [place] [address]   Phone: [phone]  Fax: [phone]        04/03/2010 MRN: [id number] Morgan [name] [address] [place], [place]  [address]  [place]             Procedure Date:  05/15/10   Welcome to the Gastroenterology Division at [facility].    You are scheduled to see a nurse for your pre-procedure visit on 04/28/10 at 1:30 pm on the 3rd floor at [facility], [address]  We ask that you try to arrive at our office 15 minutes prior to your appointment time to allow for check-in.  Please take a minute to review the attached form.  If you answer "Yes" to one or more of the questions on the first page, we ask that you call the person listed at your earliest opportunity.  If you answer "No" to all of the questions, please complete the rest of the form and bring it to your appointment.    Your nurse visit will consist of discussing your medical and surgical history, your immediate family medical history, and your medications.   If you are unable to list all of your medications on the form, please bring the medication bottles to your appointment and we will list them.  We will need to be aware of both prescribed and over the counter drugs.  We will need to know exact dosage information as well.    Please be prepared to read and sign documents such as consent forms, a financial agreement, and acknowledgement forms.  If necessary, and with your consent, a friend or relative is welcome to sit-in on the nurse visit with you.  Please bring your insurance card so that we may make a copy of it.  If your insurance requires a referral to see a specialist, please bring your referral form from your primary care physician.  No co-pay is required for this nurse visit.     If you cannot keep your appointment, please call [phone] to cancel or reschedule prior to your appointment date.   This allows [place] the opportunity to schedule an appointment for another patient in need of care.    Thank you for choosing  Gastroenterology for your medical needs.  We appreciate the opportunity to care for you.  Please visit [place] at our website  to learn more about our practice.  Sincerely, The Gastroenterology Division

## 2010-04-14 ENCOUNTER — Other Ambulatory Visit (HOSPITAL_COMMUNITY)
Admission: RE | Admit: 2010-04-14 | Discharge: 2010-04-14 | Disposition: A | Discharge status: Home / Self Care | Payer: PRIVATE HEALTH INSURANCE | Source: Ambulatory Visit | Attending: Internal Medicine | Admitting: Internal Medicine

## 2010-04-14 ENCOUNTER — Ambulatory Visit (INDEPENDENT_AMBULATORY_CARE_PROVIDER_SITE_OTHER): Payer: PRIVATE HEALTH INSURANCE | Admitting: Internal Medicine

## 2010-04-14 ENCOUNTER — Encounter: Payer: Self-pay | Admitting: Internal Medicine

## 2010-04-14 VITALS — BP 120/80 | HR 72 | Ht 65.5 in | Wt 194.0 lb

## 2010-04-14 DIAGNOSIS — Z124 Encounter for screening for malignant neoplasm of cervix: Secondary | ICD-10-CM

## 2010-04-14 DIAGNOSIS — E785 Hyperlipidemia, unspecified: Secondary | ICD-10-CM

## 2010-04-14 DIAGNOSIS — E119 Type 2 diabetes mellitus without complications: Secondary | ICD-10-CM

## 2010-04-14 DIAGNOSIS — E663 Overweight: Secondary | ICD-10-CM

## 2010-04-14 DIAGNOSIS — L709 Acne, unspecified: Secondary | ICD-10-CM

## 2010-04-14 DIAGNOSIS — Z0000 Encounter for general adult medical examination without abnormal findings: Secondary | ICD-10-CM

## 2010-04-14 DIAGNOSIS — I2510 Atherosclerotic heart disease of native coronary artery without angina pectoris: Secondary | ICD-10-CM

## 2010-04-14 DIAGNOSIS — N841 Polyp of cervix uteri: Secondary | ICD-10-CM

## 2010-04-14 DIAGNOSIS — Z01419 Encounter for gynecological examination (general) (routine) without abnormal findings: Secondary | ICD-10-CM

## 2010-04-14 DIAGNOSIS — K219 Gastro-esophageal reflux disease without esophagitis: Secondary | ICD-10-CM

## 2010-04-14 DIAGNOSIS — Z23 Encounter for immunization: Secondary | ICD-10-CM

## 2010-04-14 NOTE — Assessment & Plan Note (Signed)
Update and give pneumovax and twinrix  Today follow up on 3-4 months.

## 2010-04-14 NOTE — Patient Instructions (Addendum)
Will notify you  of labsPAP when available. We will do a gyne referral .  Plan recheck Hg a1c and rov in 3-4 months   Have  Pharmacy or you  contact us about med refills  You had pneumovax and Twinrix vaccine today.

## 2010-04-14 NOTE — Assessment & Plan Note (Signed)
On pap exam today no bleeding and normal exam  Will await pap and then do GYNE check.    Expectant management.

## 2010-04-14 NOTE — Progress Notes (Signed)
  Subjective:    Patient ID: Morgan [name], female    DOB: 12/06/1950, 60 y.o.   MRN: [id number]  HPI  patient comes in today for preventive visit. Since her last visit here in December she has had no major changes in her health. She has chronic medical diseases that appear to be stable. She is due for a Pap smear her last one was a number of years ago. Denies any new problems. CAD  Followed by Dr. Daleen Squibb no symptoms  Hyperlipidemia: taking Lipitor and now for Fish oil twice a day.  Zetia DM: gets regular eye checks might have an early cataract on the right is in much better control since she is back on the by 8 after prior authorization finally went through. She's been back on this medicine for almost 2 months. GERD: only is taking Prilosec as needed.    Review of Systems Neg vision hearing  Early cataract wears glasses, No cp sob bleeding lumps skin changes or ortho changes No neuro changes .   Sleep about 7 hours. No UTI symptoms although at the time of her labs she may have had some. She has some constipation no bleeding. Due for endo colon soon per dr Juanda Chance.    Objective:   Physical Exam Physical Exam: Vital signs reviewed NFA:OZHY is a well-developed well-nourished alert cooperative  white female who appears her stated age in no acute distress.  HEENT: normocephalic  traumatic , Eyes: PERRL EOM's full, conjunctiva clear, GlassesNares: paten,t no deformity discharge or tenderness., Ears: no deformity EAC's clear TMs with normal landmarks. Mouth: clear OP, no lesions, edema.  Moist mucous membranes. Dentition in adequate repair. NECK: supple without masses, thyromegaly or bruits. CHEST/PULM:  Clear to auscultation and percussion breath sounds equal no wheeze , rales or rhonchi. No chest wall deformities or tenderness. Breast: normal by inspection . No dimpling, discharge, masses, tenderness or discharge . CV: PMI is nondisplaced, S1 S2 no gallops, murmurs, rubs. Peripheral pulses are  full without delay.No JVD .  ABDOMEN: Bowel sounds normal nontender  No guard or rebound, no hepato splenomegal no CVA tenderness.  No hernia. Extremtities:  No clubbing cyanosis or edema, no acute joint swelling or redness no focal atrophy .Marland KitchenPOsitive for varicose veins  No ulcers on feet. NEURO:  Oriented x3, cranial nerves 3-12 appear to be intact, no obvious focal weakness,gait within normal limits no abnormal reflexes or asymmetrical SKIN: No acute rashes normal turgor, color, no bruising or petechiae. PSYCH: Oriented, good eye contact, no obvious depression anxiety, cognition and judgment appear normal. LN: no cervical axillary inguinal adenopathy Pelvic: NL ext GU, labia clear without lesions or rash . Vagina no lesions .Cervix:  Intact  With 3 mm polypoid  Lesion  soft  No masses   UTERUS: Neg CMT Adnexa:  clear no masses . PAP done  Rectal no masses stool negative hemoccult.  Labs reviewed  With patient       Assessment & Plan:  PV  Disc use of vaccine and update  GYNE exam DM controlled  And to follow  LIPIDS  Disc meds  And fish  Oil  Disc lovaza  Costly but has been studied. Crevicl polyp  Finding on exam today disc   Follow up.

## 2010-04-17 ENCOUNTER — Telehealth: Payer: Self-pay | Admitting: Internal Medicine

## 2010-04-17 NOTE — Telephone Encounter (Signed)
Pt was seen on 04-14-2010. Pt has uti now requesting med to be call into [facility] [phone]

## 2010-04-18 MED ORDER — NITROFURANTOIN MONOHYD MACRO 100 MG PO CAPS: 100.0000 mg | ORAL_CAPSULE | Freq: Two times a day (BID) | ORAL | Status: AC

## 2010-04-18 NOTE — Telephone Encounter (Signed)
Please  Document what her symptoms are. If having fever chills then needs to be seen and do  Urine culture If not then can Rx macrobid 100 bid for 7 days and if not better then OV.

## 2010-04-18 NOTE — Telephone Encounter (Addendum)
Left message to call back   Rx sent to pharmacy  

## 2010-04-19 NOTE — Telephone Encounter (Signed)
Pt called back saying that she has is not having a fever or chills. She is going to start the medication today.

## 2010-04-24 ENCOUNTER — Encounter: Payer: Self-pay | Admitting: *Deleted

## 2010-04-28 ENCOUNTER — Ambulatory Visit (AMBULATORY_SURGERY_CENTER): Payer: PRIVATE HEALTH INSURANCE | Admitting: *Deleted

## 2010-04-28 VITALS — Ht 65.5 in | Wt 191.9 lb

## 2010-04-28 DIAGNOSIS — K219 Gastro-esophageal reflux disease without esophagitis: Secondary | ICD-10-CM

## 2010-04-28 DIAGNOSIS — Z1211 Encounter for screening for malignant neoplasm of colon: Secondary | ICD-10-CM

## 2010-04-28 MED ORDER — MOVIPREP 100 G PO SOLR: 1.0000 | Freq: Once | ORAL | Status: AC

## 2010-05-08 ENCOUNTER — Other Ambulatory Visit: Payer: Self-pay | Admitting: Internal Medicine

## 2010-05-11 ENCOUNTER — Encounter: Payer: Self-pay | Admitting: Internal Medicine

## 2010-05-15 ENCOUNTER — Ambulatory Visit (AMBULATORY_SURGERY_CENTER): Payer: PRIVATE HEALTH INSURANCE | Admitting: Internal Medicine

## 2010-05-15 ENCOUNTER — Encounter: Payer: Self-pay | Admitting: Internal Medicine

## 2010-05-15 VITALS — BP 128/73 | HR 61 | Temp 98.0°F | Resp 20 | Ht 66.0 in | Wt 188.0 lb

## 2010-05-15 DIAGNOSIS — K277 Chronic peptic ulcer, site unspecified, without hemorrhage or perforation: Secondary | ICD-10-CM

## 2010-05-15 DIAGNOSIS — K2940 Chronic atrophic gastritis without bleeding: Secondary | ICD-10-CM

## 2010-05-15 DIAGNOSIS — K3189 Other diseases of stomach and duodenum: Secondary | ICD-10-CM

## 2010-05-15 DIAGNOSIS — R1013 Epigastric pain: Secondary | ICD-10-CM

## 2010-05-15 DIAGNOSIS — Z8711 Personal history of peptic ulcer disease: Secondary | ICD-10-CM

## 2010-05-15 DIAGNOSIS — K208 Other esophagitis without bleeding: Secondary | ICD-10-CM

## 2010-05-15 DIAGNOSIS — K2980 Duodenitis without bleeding: Secondary | ICD-10-CM

## 2010-05-15 DIAGNOSIS — K259 Gastric ulcer, unspecified as acute or chronic, without hemorrhage or perforation: Secondary | ICD-10-CM

## 2010-05-15 DIAGNOSIS — D371 Neoplasm of uncertain behavior of stomach: Secondary | ICD-10-CM

## 2010-05-15 DIAGNOSIS — D378 Neoplasm of uncertain behavior of other specified digestive organs: Secondary | ICD-10-CM

## 2010-05-15 DIAGNOSIS — K219 Gastro-esophageal reflux disease without esophagitis: Secondary | ICD-10-CM

## 2010-05-15 DIAGNOSIS — K273 Acute peptic ulcer, site unspecified, without hemorrhage or perforation: Secondary | ICD-10-CM

## 2010-05-15 DIAGNOSIS — K921 Melena: Secondary | ICD-10-CM

## 2010-05-15 DIAGNOSIS — Z1211 Encounter for screening for malignant neoplasm of colon: Secondary | ICD-10-CM

## 2010-05-15 DIAGNOSIS — K625 Hemorrhage of anus and rectum: Secondary | ICD-10-CM

## 2010-05-15 DIAGNOSIS — D375 Neoplasm of uncertain behavior of rectum: Secondary | ICD-10-CM

## 2010-05-15 LAB — GLUCOSE, CAPILLARY: Glucose-Capillary: 159 mg/dL — ABNORMAL HIGH (ref 70–99)

## 2010-05-15 MED ORDER — HYDROCORTISONE 2.5 % EX CREA: TOPICAL_CREAM | Freq: Two times a day (BID) | CUTANEOUS | Status: DC

## 2010-05-15 MED ORDER — SODIUM CHLORIDE 0.9 % IV SOLN: 500.0000 mL | INTRAVENOUS | Status: DC

## 2010-05-15 NOTE — Patient Instructions (Signed)
Please see green/blue papers for discharge instructions.

## 2010-05-16 ENCOUNTER — Telehealth: Payer: Self-pay | Admitting: *Deleted

## 2010-05-16 NOTE — Telephone Encounter (Signed)

## 2010-05-19 ENCOUNTER — Ambulatory Visit (INDEPENDENT_AMBULATORY_CARE_PROVIDER_SITE_OTHER): Payer: PRIVATE HEALTH INSURANCE | Admitting: Family Medicine

## 2010-05-19 ENCOUNTER — Other Ambulatory Visit: Payer: Self-pay | Admitting: Obstetrics and Gynecology

## 2010-05-19 ENCOUNTER — Ambulatory Visit: Payer: PRIVATE HEALTH INSURANCE | Admitting: Family Medicine

## 2010-05-19 DIAGNOSIS — Z0000 Encounter for general adult medical examination without abnormal findings: Secondary | ICD-10-CM

## 2010-05-19 DIAGNOSIS — Z23 Encounter for immunization: Secondary | ICD-10-CM

## 2010-05-22 ENCOUNTER — Encounter: Payer: Self-pay | Admitting: Internal Medicine

## 2010-05-22 ENCOUNTER — Telehealth: Payer: Self-pay | Admitting: *Deleted

## 2010-05-22 NOTE — Telephone Encounter (Signed)
Message copied by Jesse Fall on Mon May 22, 2010  2:47 PM ------      Message from: [name]      Created: Mon May 22, 2010  1:27 PM       Please schedule for repeat EGD in 6 weeks DB

## 2010-05-22 NOTE — Telephone Encounter (Signed)
Left a message for patient to call back. 

## 2010-05-23 ENCOUNTER — Telehealth: Payer: Self-pay | Admitting: *Deleted

## 2010-05-23 ENCOUNTER — Encounter: Payer: Self-pay | Admitting: *Deleted

## 2010-05-23 DIAGNOSIS — K219 Gastro-esophageal reflux disease without esophagitis: Secondary | ICD-10-CM

## 2010-05-23 NOTE — Telephone Encounter (Signed)
Message copied by Jesse Fall on Tue May 23, 2010  9:45 AM ------      Message from: [name]      Created: Mon May 22, 2010  1:27 PM       Please schedule for repeat EGD in 6 weeks DB

## 2010-05-23 NOTE — Telephone Encounter (Signed)
Spoke with patient to schedule her for a repeat EGD with biopsy. Schedule patient on 06/30/10 at 11:00 AM with 10:00 AM arrival. Pre visit on 06/23/10 at 10:00 AM. Patient would like for Dr. Juanda Chance to call her on her cell ([phone]) to discuss her biopsy results from 05/15/10.

## 2010-05-23 NOTE — Telephone Encounter (Signed)
Message copied by Jesse Fall on Tue May 23, 2010  2:03 PM ------      Message from: [place], [place]      Created: Mon May 22, 2010  1:27 PM       Please schedule for repeat EGD in 6 weeks DB

## 2010-05-23 NOTE — Telephone Encounter (Signed)
Patient returned our call and wants to have a message left at her number with results/appt. Called and left patient a message that we need to schedule her for repeat EGD in 6 weeks and read what her letter with results says.

## 2010-05-23 NOTE — Telephone Encounter (Signed)
Message copied by Jesse Fall on Tue May 23, 2010  2:15 PM ------      Message from: [place], [place]      Created: Mon May 22, 2010  1:27 PM       Please schedule for repeat EGD in 6 weeks DB

## 2010-05-23 NOTE — Telephone Encounter (Signed)
Patient left me a voice mail that she would like to schedule her EGD on 06-26-10. MD is not available on that day. Left message for patient.

## 2010-05-23 NOTE — Telephone Encounter (Signed)
Left message for patient to call.

## 2010-05-24 NOTE — Telephone Encounter (Signed)
I have spoken to the pt about " atypical cells" in gastric ulcer. No reason to worry but we need to follow it till the ulcer heals completely. She will keep her appointment for EGD

## 2010-06-20 NOTE — Assessment & Plan Note (Signed)
[facility]                            CARDIOLOGY OFFICE NOTE   NAME:[name], Morgan [name]                    MRN:          [id number]  DATE:04/16/2008                            DOB:          [birth year]    [name] comes in today for followup.  She has been doing remarkably  well.  She has lost an additional 15 pounds or so and is down to 204!  She says that is not her it is the Byetta.   She had some upper arm aching back in December.  She was concerned it  might have been the statin.  We checked laboratory data and it is  including a CK, which was normal.  Her SGPT was slightly increased at  44.  SGOT was normal.  The rest of her blood work was unremarkable  including a low C-reactive protein.  I have shared all that information  with her today.   She is currently having no angina or ischemic symptoms.   Her problem list, please see the note from February 21, 2007, or EMR.   She has lost her primary care physician.  She is very anxious about  this.  She asked for suggestions within [place].  I suggested Dr. Berniece Andreas if her new physician with [facility] does not work out.   Her meds are in the EMR.  There have been no significant changes except  she is no longer of course on Avandamet.   PHYSICAL EXAMINATION:  Today, blood pressure is 124/72, her weight is  204, her pulse is 62 and regular.  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  HEENT is normal.  Her face is much  thinner.  Neck is supple.  Carotid upstrokes were equal bilaterally  without bruits, no JVD.  Thyroid is not enlarged.  Trachea is midline.  Lungs were clear to auscultation and percussion.  Heart reveals a  regular rate and rhythm.  No gallop.  Abdominal exam is soft.  Good  bowel sounds.  No midline bruit.  Extremities without cyanosis,  clubbing, or edema.  Pulses are intact.  Neuro exam is intact.  Skin is  unremarkable.   ASSESSMENT AND PLAN:  Ms.  Morgan [name] is doing well.  I am really pleased with  her weight loss.  We will check her fasting lipids and LFTs.  In  addition, we have renewed all her medications since she is waiting to  establish with a new primary care physician.   We will plan on seeing her back in a year.  At that time, she will need  objective assessment of her coronary artery disease.     Thomas C. Daleen Squibb, MD, [facility]  Electronically Signed    TCW/MedQ  DD: 04/16/2008  DT: 04/16/2008  Job #: [id number]

## 2010-06-20 NOTE — Assessment & Plan Note (Signed)
[facility]                            CARDIOLOGY OFFICE NOTE   NAME:Morgan [name], Morgan [name]                    MRN:          [id number]  DATE:02/21/2007                            DOB:          [birth year]    Mrs. Morgan [name] returns today for further management of the following issues.  1. Coronary disease status post inferior wall in 1993.  She had 3 drug-      eluting stents to a totally occluded proximal right coronary artery      in [place].  It was felt she had a ruptured plaque.  She has done      remarkably well with no angina or ischemic symptoms.  Her last      stress Myoview was April 20, 2005, EF 65% normal wall motion no      ischemia.  2. Hyperlipidemia.  Being followed by Dr. Smith Mince.  Her HDL is still      in the mid 40s at 45, LDL 76, total cholesterol 914, triglycerides      normal.  She is on a combination of Zetia and Lipitor.  3. Glucose intolerance.  Her last hemoglobin A1c was 6.3%.  She is on      metformin and Byetta now, so I guess we could say she is diabetic.  4. Obesity.  She has loss from 232 to 219 and looks remarkably good.   MEDICATIONS:  1. Folic acid daily.  2. Aspirin 81 mg a day.  3. Metformin unknown dose b.i.d.  4. Byetta 10 units daily.  5. Zetia 10 mg a day.  6. Lipitor 40 mg a day.   She denies orthopnea, PND or peripheral edema.  She has had no tachy  palpitations, syncope or presyncope.   EXAM:  Blood pressure 138/79, her pulse is 79 and regular.  Her weight  is 219.  HEENT:  Normocephalic, atraumatic.  PERRL.  Extraocular movements  intact.  Sclera clear.  Face symmetry is normal.  Dentition  satisfactory.  She wears glasses.  Neck is supple.  Carotid upstrokes were equal bilaterally without  bruits, no JVD.  Thyroid is not enlarged.  Trachea is midline.  LUNGS:  Clear.  HEART:  Reveals a nondisplaced PMI.  She has normal S1-S2 without  murmur, rub or gallop.  ABDOMEN:  Soft, good bowel sounds.  No  midline bruit.  No hepatomegaly.  EXTREMITIES:  No cyanosis, clubbing, or edema.  Pulses are intact.  NEURO:  Exam is intact.   I am delighted with how Morgan [name] is doing.  I have really complemented her  on her weight loss.  She looks good.   Continue with current medications and will set her up for an adenosine  rest stress Myoview.  Assuming this is negative for ischemia, we will  see her back in a year.     Thomas C. Daleen Squibb, MD, [facility]  Electronically Signed    TCW/MedQ  DD: 02/21/2007  DT: 02/21/2007  Job #: [id number]   cc:   Talmadge Coventry, M.D.

## 2010-06-23 ENCOUNTER — Ambulatory Visit (AMBULATORY_SURGERY_CENTER): Payer: PRIVATE HEALTH INSURANCE | Admitting: *Deleted

## 2010-06-23 DIAGNOSIS — K251 Acute gastric ulcer with perforation: Secondary | ICD-10-CM

## 2010-06-23 NOTE — Discharge Summary (Signed)
NAME:  Morgan [name], Morgan [name]                       ACCOUNT NO.:  [id number]   MEDICAL RECORD NO.:  [id number]                   PATIENT TYPE:  INP   LOCATION:  5022                                 FACILITY:  [facility]   PHYSICIAN:  Claude Manges. Cleophas Dunker, M.D.            DATE OF BIRTH:  1950-08-29   DATE OF ADMISSION:  03/11/2002  DATE OF DISCHARGE:  03/13/2002                                 DISCHARGE SUMMARY   ADMISSION DIAGNOSES:  1. Left distal fibula fracture with talotibial subluxation.  2. Coronary artery disease with history of myocardial infarction.  3. Hypercholesterolemia.   DISCHARGE DIAGNOSES:  1. Left distal fibula fracture status post ORIF of fracture.  2. Coronary artery disease with history of myocardial infarction.  3. Hypercholesterolemia.   PROCEDURE:  On 03/12/02, the patient underwent an open reduction internal  fixation and repair of the deltoid ligament of her left ankle fracture and  lateral subluxation of the talus by Claude Manges. Cleophas Dunker, M.D., assisted by  Legrand Pitts. Duffy, P.A.   CURRENT MEDICAL PROBLEMS:  None.   CONSULTATIONS:  1. [facility] consult on 03/11/02.  2. Case management, physical therapy and [facility] consult     03/13/02.   HISTORY OF PRESENT ILLNESS:  This 60 year old female [place]  and she slipped on some ice and injured her left ankle.  She was brought to  the [facility] and was found to have a distal left fibula fracture with  talotibial subluxation.  She is being admitted for surgical fixation of the  fracture and cardiology clearance preoperatively.   HOSPITAL COURSE:  The patient was admitted initially on Dr. Sherene Sires  service, but with her cardiac history, it was felt she needed preop cardiac  clearance.  [place] [facility] was consulted and they did a 2D  echocardiogram which showed normal left ventricular systolic function and  trivial mitral regurgitation.  A Cardiolite was also done and she was  cleared for surgery on 03/12/02.  That evening, she underwent open reduction  internal fixation of that fracture and repair of the deltoid ligament which  she tolerated well.  On postop day #1, T-max was 100.1, vitals were stable.  Leg was neurovascularly intact.  She was switched to p.o. pain medications,  started on PT, and felt she was  doing well enough that she could be  discharged home that day.  She was discharged home on 03/13/02.   DISCHARGE INSTRUCTIONS:   DIET:  She can resume her regular prehospitalization diet.   MEDICATIONS:  She can resume her prehospitalization medications which  included:  1. Plavix 75 mg p.o. q.a.m.  2. Wellbutrin one tablet p.o. b.i.d.  3. Foltx one tablet p.o. q.a.m.  4. Aspirin one tablet p.o. daily.  5. Lipitor 40 mg one tablet p.o. q.p.m.  6. Minocin 100 mg one tablet p.o. p.r.n.  Additional medications include:  1. Lopressor 12.5 mg one tablet  p.o. b.i.d. #60 with no refill.  2. Vicodin 5/325 mg 1-2 p.o. q.4h. p.r.n. pain, #50 with no refill.   ACTIVITY:  She is to be out of bed and touchdown weightbearing on the left  leg with the use of walker and crutches.  She is to keep the left leg in  splint, elevated above her heart as much as possible.  She is to arrange for  home health physical therapy.   WOUND CARE:  She needs to keep her left leg splint clean and dry and cannot  take a shower.  She is to notify Dr. Cleophas Dunker of temperature greater than  or equal to 101.5 degrees, chills, pain unrelieved by pain medications,  decreased sensation in her foot or foul smelling drainage from the wound or  also if she has any abdominal bloating, discomfort or inability to urinate.   FOLLOWUP:  She needs to follow up with Dr. Cleophas Dunker in our office the week  after discharge and needs to call [phone] for that appointment.  She needs  to follow up with Dr. Daleen Squibb in his office in approximately one to two weeks.   LABORATORY DATA:  Echocardiogram done on  03/11/02 showed overall left  ventricular systolic function normal with left ventricular ejection fraction  estimated at 55-65%.  Noninvasive cardiology exercise stress test done on  03/12/02 showed normal sinus rhythm at 76 normal BP and heart rate response.  No ST changes.  Chest x-ray done on 03/11/02 showed a normal chest x-ray.  A  2D was taken of the left ankle on the fourth showed acute distal left fibula  shaft fracture with a lateral talotibial subluxation and findings compatible  with remote cortical avulsion fracture of the medial malleolus, posterior  malleolus and possibly medial talus.  Post reduction films taken that day  after reduction showed no significant change in position or alignment of the  fracture fragments related to an oblique fracture of the distal fibula.  There is a transverse fracture of the tip of the medial malleolus and  widening of the ankle morbus.   Myocardial perfusion scan done on 03/12/02 showed ejection fraction of 51%  with an end diastolic volume of 121 mils and end systolic volume of 50 mils.  This is probably artifactually low due to the abdominal artifact.  They  believe the ejection fraction is probably closer to 60%.  The study was  limited due to radiotracer uptake from the abdomen, and there were no  obvious perfusion defects.   On 2/4, white count 11.9.  On 2/6, hemoglobin 11.3, hematocrit 32.5.  All  other laboratory studies were within normal limits.     Legrand Pitts Duffy, P.A.                      Claude Manges. Cleophas Dunker, M.D.    KED/MEDQ  D:  04/28/2002  T:  04/29/2002  Job:  [id number]   cc:   Talmadge Coventry, M.D.  [address], [address]  [place]  [place] [address]  Fax: [phone]   Jesse Sans. Wall, M.D. [facility]

## 2010-06-23 NOTE — Op Note (Signed)
NAME:  Morgan [name], Morgan [name]                       ACCOUNT NO.:  [id number]   MEDICAL RECORD NO.:  [id number]                   PATIENT TYPE:  INP   LOCATION:  5022                                 FACILITY:  [facility]   PHYSICIAN:  Claude Manges. Cleophas Dunker, M.D.            DATE OF BIRTH:  [birth year]   DATE OF PROCEDURE:  03/12/2002  DATE OF DISCHARGE:                                 OPERATIVE REPORT   PREOPERATIVE DIAGNOSIS:  Displaced left fibula fracture (distal) with  lateral subluxation of talus.   POSTOPERATIVE DIAGNOSIS:  Displaced left fibula fracture (distal) with  lateral subluxation of talus.   PROCEDURE:  1. Open reduction, internal fixation of distal fibula with diastasis screw.  2. Repair of deltoid ligament.   SURGEON:  Claude Manges. Cleophas Dunker, M.D.   ASSISTANT:  Legrand Pitts. Duffy, P.A.   ANESTHESIA:  General orotracheal.   COMPLICATIONS:  None.   PROCEDURE IN DETAIL:  With the patient comfortable on the operating table  and under general orotracheal anesthesia, the left lower extremity was  placed in a thigh tourniquet.  The left lower extremity was then prepped  with Betadine scrub and then Duraprep from the tips of the toes to the  knees.  Sterile draping was performed.  With the extremity still elevated,  it was Esmarch exsanguinated with a proximal tourniquet to 350 mmHg.   A longitudinal incision was made along the distal fibula and via sharp  dissection carried down to subcutaneous tissue.  Via blunt dissection, the  soft tissue was elevated off the distal fibula.  There was gross un-clotted  blood both from the joint as well as from the fracture site.  Retractors  were then inserted.  The fracture was identified.  It was a long oblique  fracture beginning distally and anteriorly and extending proximally and  posteriorly.  Under direct visualization, the fracture was reduced and  maintained with bone clamps.   Image identification was then performed, and it appeared that  there was  still some widening of the ankle mortise medially with a small chip of bone  probably attached to the deltoid ligament.  I felt that it was probably  invaginated.  Accordingly, a second oblique incision was made over the  medial malleolus and via sharp dissection carried down to subcutaneous  tissue.  Via blunt dissection, the soft tissue was elevated off the distal  fibula.  There was complete avulsion of the deltoid ligament.  There was  invagination of a fragment of bone attached to the deltoid ligament into the  joint.  This was removed.  The medial gutter was further explored without  any evidence of any further material.  The posterior tibial tendon was  intact.  The joint was then irrigated.  One further piece of bone was  removed.  The deltoid ligament was then reapproximated with 0 Vicryl.  The  wound was irrigated with saline solution, and 2-0 Vicryl was  used for the  subcutaneous tissue.  The skin was closed with skin clips.   We had good reduction of the ankle mortise at that point and excellent  position of the distal fibula fracture.  A 7-holed titanium DePuy distal  fibula plate was applied.  It was bent distally to conform to the shape of  the fibula.  The two distal screw holes were drilled with a 2.9 drill bit  for the 3.5 screws, measured and filled with the self-tapping cancellous  screws with excellent position.  The third was positioned just above the  ankle joint; and because there was some opening with diastasis of the distal  tibia-fibula joint, a diastasis screw was inserted measuring 50 mm.  With  the ankle at maximum dorsiflexion, the diastasis screw was inserted with  excellent position.   The proximal three holes were appropriately drilled, measured and filled  with self-tapping cortical screws before insertion of the diastasis screw.  The bone clamps were removed.  We had excellent position of the fracture  without any change, i.e., anatomic.   The wound was then irrigated with  saline solution.  The fascia was closed with 0 Vicryl.  The subcutaneous  with 2-0 Vicryl.  Skin closed with skin clips.  Next, 0.25% Marcaine with  epinephrine was injected into the wound edges.  A sterile bulky dressing was  applied.  Tourniquet was deflated with immediate capillary refill to the  toes.  A posterior splint was applied.   The patient tolerated the procedure without complications.                                               Claude Manges. Cleophas Dunker, M.D.    PWW/MEDQ  D:  03/12/2002  T:  03/13/2002  Job:  [id number]

## 2010-06-23 NOTE — Consult Note (Signed)
NAME:  Morgan [name], Morgan [name]                       ACCOUNT NO.:  [id number]   MEDICAL RECORD NO.:  [id number]                   PATIENT TYPE:  EMS   LOCATION:  MINO                                 FACILITY:  [facility]   PHYSICIAN:  Charlton Haws, M.D. [facility]              DATE OF BIRTH:  12/20/1950   DATE OF CONSULTATION:  03/11/2002  DATE OF DISCHARGE:                                   CONSULTATION   REASON FOR CONSULTATION:  The patient is a pleasant 60 year old patient of  Dr. Juanito Doom; her primary care doctor is Dr. Smith Mince.  She was seen in the  ER after falling and fracturing her left ankle; apparently, this needs  surgery.   The patient has had a recent myocardial infarction in April 2003.  She was  traveling in [place] at the time and was having a myocardial infarction  and was taken to the catheterization laboratory emergently.  From what she  describes, she either had a complicated angioplasty and stent of the right  coronary artery or circumflex.  It sounds like there was significant  evidence for dissection and suboptimal result, since she had three  overlapping stents placed.   She has not had a follow-up stress test since this time.  Fortunately, she  is not smoking.   She has not had significant chest pain.   She is still taking Plavix.  She was supposed to follow up with Dr. Daleen Squibb and  have a stress test but apparently hurt her ankle back in October or November  as well and did not think she could have a stress test at that time.   She tells me that during her catheterization she did not have blockages in  her other arteries.  She has not had significant congestive heart failure  and I doubt that she has decreased LV function.   PAST MEDICAL HISTORY:  Otherwise remarkable for previous smoking, previous  ganglion cyst, and D&C.   ALLERGIES:  PENICILLIN.   MEDICATIONS:  1. Lipitor 40 daily.  2. Plavix 75 daily.  3. Minocin.  4. Wellbutrin.   PHYSICAL EXAMINATION:   GENERAL:  She has a cast on her left foot and ankle.  She looks well.  VITAL SIGNS:  She is afebrile, pulse 71, blood pressure 111/54, saturations  97%.  LUNGS:  Clear.  CARDIOVASCULAR:  Carotids are normal.  There is an S1, S2 with normal heart  sounds.  ABDOMEN:  Benign.  EXTREMITIES:  Lower extremities reveal intact pulses on the right.  Left  lower extremity is not examined due to the swelling and cast.   LABORATORY DATA:  EKG shows sinus rhythm with previous inferior wall MI.  Labs are pending.   IMPRESSION:  Unfortunately, I do not think it is prudent to clear the  patient for surgery immediately.  We need to try to get her records from  [facility] in [place], particularly since she  has a lot of metal in  her coronary artery with multiple stents.  I think it is prudent to do an  adenosine Cardiolite study in the morning.  If this is not high-risk she can  proceed to have ankle surgery later in the afternoon tomorrow.   We will continue her current medications.                                               Charlton Haws, M.D. [facility]    PN/MEDQ  D:  03/11/2002  T:  03/11/2002  Job:  [id number]

## 2010-06-23 NOTE — Assessment & Plan Note (Signed)
[facility]                            CARDIOLOGY OFFICE NOTE   NAME:Morgan [name], Morgan [name]                    MRN:          [id number]  DATE:02/11/2006                            DOB:          Apr 08, 1950    Morgan [name] returns today for further management of coronary artery  disease.   PROBLEM LIST:  1. Coronary artery disease status post inferior wall infarct 1993.      She had three drug-eluting stents placed to a totally-occluded      proximal right coronary artery.  Etiology is felt to be a ruptured      plaque.  She is having no angina or ischemia.  Her last stress      Myoview April 20, 2005, showed an EF of 65%, normal LV function,      normal wall motion, and no ischemia.  2. Hyperlipidemia.  On a combination of Lipitor and Zetia her lipids      are remarkably good.  Blood drawn September 21, 2005, showed a total      cholesterol of 126, triglycerides of 52, HDL of 44, LDL of 72, LFTs      were normal.  3. Obesity.   She has been unsuccessful in losing weight.  She was placed on Byetta 10  units a day by Dr. Smith Mince in hopes of decreasing her weight.  She  also wants to consider bariatric surgery, which I am not sure she meets  criteria for.  She knows a Dr. Marcos Eke over at [facility] that  has an excellent reputation.   Her medications are:  1. Folic acid.  2. Aspirin 81 mg a day.  3. Metformin unknown dose b.i.d.  4. Byetta 10 units a day.  5. Zetia 10 mg a day.  6. Lipitor 40 mg a day.   She denies any orthopnea, PND, peripheral edema, presyncope, syncope,  tachypalpitations.  She has had no melena, hematochezia, difficulty  swallowing, but has had some reflux symptoms.   EXAMINATION TODAY:  VITAL SIGNS:  Blood pressure is 125/79, her pulse is  67 in sinus rhythm.  She has a first-degree AV block which is stable.  HEENT:  Normocephalic, atraumatic, PERRLA, extraocular movements intact.  She wears glasses.  Facial symmetry is  normal.  Dentition satisfactory.  NECK:  Carotid upstrokes are equal bilaterally without bruits.  There is  no JVD.  Thyroid is not enlarged.  Neck is supple.  LUNGS:  Clear.  HEART:  Reveals a regular rate and rhythm.  No murmur, rub or gallop.  ABDOMEN:  Soft with good bowel sounds, no midline bruit.  There is no  hepatomegaly, there is no tenderness.  EXTREMITIES:  Without cyanosis, clubbing or edema.  Pulses are brisk.  MUSCULOSKELETAL:  Intact.  SKIN:  Shows no ecchymoses or other major lesions.   ASSESSMENT AND PLAN:  Morgan [name] is doing well at the present time.  I  have made no changes in her program.  I have encouraged her to try to  walk 3 hours a week per the JAMA study which showed significant  reduction in  morbidity and mortality from cardiovascular disease, even  patients who are overweight.  Will continue with her current medications  and her lipids are at goal.   If she is unsuccessful with her weight reduction on Byetta, she would  like me help her be referred for potential bariatric surgery.  I am  happy to do so.  I will see her back in a year.     Thomas C. Daleen Squibb, MD, [facility]  Electronically Signed    TCW/MedQ  DD: 02/11/2006  DT: 02/11/2006  Job #: [id number]   cc:   Talmadge Coventry, M.D.

## 2010-06-30 ENCOUNTER — Encounter: Payer: Self-pay | Admitting: Internal Medicine

## 2010-06-30 ENCOUNTER — Ambulatory Visit (AMBULATORY_SURGERY_CENTER): Payer: PRIVATE HEALTH INSURANCE | Admitting: Internal Medicine

## 2010-06-30 VITALS — BP 114/64 | HR 67 | Temp 98.2°F | Resp 18 | Ht 66.0 in | Wt 193.0 lb

## 2010-06-30 DIAGNOSIS — K449 Diaphragmatic hernia without obstruction or gangrene: Secondary | ICD-10-CM

## 2010-06-30 DIAGNOSIS — K277 Chronic peptic ulcer, site unspecified, without hemorrhage or perforation: Secondary | ICD-10-CM

## 2010-06-30 DIAGNOSIS — K921 Melena: Secondary | ICD-10-CM

## 2010-06-30 DIAGNOSIS — K259 Gastric ulcer, unspecified as acute or chronic, without hemorrhage or perforation: Secondary | ICD-10-CM

## 2010-06-30 DIAGNOSIS — K2940 Chronic atrophic gastritis without bleeding: Secondary | ICD-10-CM

## 2010-06-30 DIAGNOSIS — D378 Neoplasm of uncertain behavior of other specified digestive organs: Secondary | ICD-10-CM

## 2010-06-30 DIAGNOSIS — D375 Neoplasm of uncertain behavior of rectum: Secondary | ICD-10-CM

## 2010-06-30 DIAGNOSIS — D371 Neoplasm of uncertain behavior of stomach: Secondary | ICD-10-CM

## 2010-06-30 LAB — GLUCOSE, CAPILLARY: Glucose-Capillary: 113 mg/dL — ABNORMAL HIGH (ref 70–99)

## 2010-06-30 MED ORDER — SODIUM CHLORIDE 0.9 % IV SOLN: 500.0000 mL | INTRAVENOUS | Status: DC

## 2010-06-30 NOTE — Patient Instructions (Signed)
PLEASE FOLLOW DISCHARGE INSTRUCTIONS GIVEN TODAY. CONTINUE CURRENT MEDICATIONS. BIOPSIES TAKEN TODAY, YOU WILL RECEIVE RESULT LETTER IN YOUR MAIL IN ABOUT 1-2 WEEKS. CALL us WITH ANY QUESTIONS OR CONCERNS. WE WILL DO A FOLLOW UP CALL ON Tuesday MORNING.

## 2010-07-04 ENCOUNTER — Telehealth: Payer: Self-pay | Admitting: *Deleted

## 2010-07-04 NOTE — Telephone Encounter (Signed)

## 2010-07-08 ENCOUNTER — Encounter: Payer: Self-pay | Admitting: Internal Medicine

## 2010-07-14 ENCOUNTER — Other Ambulatory Visit: Payer: Self-pay | Admitting: Internal Medicine

## 2010-07-14 ENCOUNTER — Other Ambulatory Visit (INDEPENDENT_AMBULATORY_CARE_PROVIDER_SITE_OTHER): Payer: PRIVATE HEALTH INSURANCE

## 2010-07-14 DIAGNOSIS — E119 Type 2 diabetes mellitus without complications: Secondary | ICD-10-CM

## 2010-07-26 ENCOUNTER — Ambulatory Visit: Payer: PRIVATE HEALTH INSURANCE | Admitting: Internal Medicine

## 2010-07-28 ENCOUNTER — Ambulatory Visit: Payer: PRIVATE HEALTH INSURANCE | Admitting: Internal Medicine

## 2010-08-17 ENCOUNTER — Encounter: Payer: Self-pay | Admitting: Internal Medicine

## 2010-08-17 ENCOUNTER — Ambulatory Visit (INDEPENDENT_AMBULATORY_CARE_PROVIDER_SITE_OTHER): Payer: PRIVATE HEALTH INSURANCE | Admitting: Internal Medicine

## 2010-08-17 VITALS — BP 126/64 | HR 80 | Ht 66.0 in | Wt 194.0 lb

## 2010-08-17 DIAGNOSIS — Q409 Congenital malformation of upper alimentary tract, unspecified: Secondary | ICD-10-CM

## 2010-08-17 DIAGNOSIS — K257 Chronic gastric ulcer without hemorrhage or perforation: Secondary | ICD-10-CM

## 2010-08-17 DIAGNOSIS — Q403 Congenital malformation of stomach, unspecified: Secondary | ICD-10-CM

## 2010-08-17 NOTE — Patient Instructions (Addendum)
You have been scheduled for a CT scan of the abdomen and pelvis at [facility] ([address]---this is in the same building as [facility]).   You are scheduled on  09/08/10 at 9:00 am. You should arrive 15 minutes prior to your appointment time for registration. Please follow the written instructions below on the day of your exam:  1) Do not eat or drink anything after 5:00 am (4 hours prior to your test) 2) You have been given 2 bottles of oral contrast to drink. The solution may taste better if refrigerated, but do NOT add ice or any other liquid to this solution. Shake well before drinking.  Drink 1 bottle of contrast @ 7:00 am (2 hours prior to your exam)  Drink 1 bottle of contrast @ 8:00 am (1 hour prior to your exam)  You may take any medications as prescribed with a small amount of water except for the following: Metformin, Glucophage, Glucovance, Avandamet, Riomet, Fortamet, Actoplus Met, Janumet, Glumetza or Metaglip. The above medications must be held the day of the exam and 48 hours after the exam.  The purpose of you drinking the oral contrast is to aid in the visualization of your intestinal tract. The contrast solution may cause some diarrhea. Before your exam is started, you will be given a small amount of fluid to drink. Depending on your individual set of symptoms, you may also receive an intravenous injection of x-ray contrast/dye.  If you have any questions regarding your exam or if you need to reschedule, you may call the CT department at [phone] between the hours of 8:00 am and 5:00 pm, Monday-Friday.  ________________________________________________________________________ Your physician has requested that you go to the basement for the following lab work before leaving today: BUN, Creatinine Dr Fabian Sharp

## 2010-08-17 NOTE — Progress Notes (Signed)
Morgan [name] 1950/10/14 MRN [id number]        History of Present Illness:  This is a 60 year old, white female with the gastric/antral  ulcer found on upper endoscopy in April 2012. Biopsies showed glandular dysplasia associated with intestinal metaplasia and chronic inflammation. 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 At that point the gastric ulcer was almost healed. She is asymptomatic except for occasional dyspepsia. She denies weight loss rectal bleeding night sweats or abdominal pain. She has been on omeprazole 20 mg daily.. she had a normal colonoscopy in April 2012   Past Medical History  Diagnosis Date  . DIABETES MELLITUS, TYPE II 12/09/2009  . HYPERLIPIDEMIA-MIXED 04/14/2008  . OVERWEIGHT/OBESITY 05/13/2009  . CAD, NATIVE VESSEL 05/13/2009  . Occult blood in stools   . Anemia     as a child   . Hx of abnormal cervical Pap smear     cryo  but normal since then   . Ulcer     Duodenal ulcer by x-ray 40 years ago  . Osteopenia   . Obesity    Past Surgical History  Procedure Date  . Tubal ligation     One Tube  . Dilation and curettage of uterus   . Fibula fracture surgery   . Closed reduction proximal tibiofibular joint dislocaton 2004    left  . Cesarean section     x2  . Tonsillectomy   . Ganglion cyst excision   . Colonoscopy   . Upper gastrointestinal endoscopy     reports that she quit smoking about 16 years ago. She has never used smokeless tobacco. She reports that she does not drink alcohol or use illicit drugs. family history includes Alcohol abuse in her brother, father, and mother; Aneurysm in her father and paternal grandmother; Diabetes in her brothers and unspecified family member; Early death in her mother; Emphysema in her mother; Heart attack in her father and mother; Heart disease in her father and mother; Hypertension in her father; and Lung cancer in her  father. Allergies  Allergen Reactions  . Penicillins     REACTION: as a child        Review of Systems: Denies dysphagia, odynophagia or abdominal pain weight loss  The remainder of the 10  point ROS is negative except as outlined in H&P   Physical Exam: General appearance  Well developed in no distress Eyes- non icteric HEENT nontraumatic, normocephalic Mouth no lesions, tongue papillated, no cheilosis Neck supple without adenopathy, thyroid not enlarged, no carotid bruits, no JVD Lungs Clear to auscultation bilaterally Cor normal S1 normal S2, regular rhythm , no murmur,  quiet precordium Abdomen nontender soft normoactive bowel sounds Rectal: Not done Extremities no pedal edema Skin no lesions Neurological alert and oriented x3 Psychological normal mood and  affect  Assessment and Plan:  Small gastric antral ulcer, appears to be healing endoscopically. Biopsies are suggestive of intestinal metaplasia and a atypia. There is no high-grade dysplasia or carcinoma. It is not clear at this point whether this is a progression toward high-grade dysplasia or just a reflexion of chronic inflammation. I have discussed the findings with Dr. Colonel Bald, the pathologist. We will plan to repeat the upper endoscopies and biopsies in November 2012. In the meantime we will obtain CT scan of the abdomen and pelvis to look for enlargement lymph nodes in the mesentery or abnormal thickening of gastric wall which would not be appreciated on upper endoscopy.  She will remain on omeprazole 20 mg daily. She is comfortable with that decision. We will also obtain B12 levels because of the possibility of atrophic gastritis and B12 malabsorption   08/17/2010 [name]

## 2010-08-24 ENCOUNTER — Encounter: Payer: Self-pay | Admitting: Internal Medicine

## 2010-08-25 ENCOUNTER — Encounter: Payer: Self-pay | Admitting: Internal Medicine

## 2010-08-25 ENCOUNTER — Ambulatory Visit (INDEPENDENT_AMBULATORY_CARE_PROVIDER_SITE_OTHER): Payer: PRIVATE HEALTH INSURANCE | Admitting: Internal Medicine

## 2010-08-25 VITALS — BP 100/68 | HR 72 | Wt 192.0 lb

## 2010-08-25 DIAGNOSIS — K2940 Chronic atrophic gastritis without bleeding: Secondary | ICD-10-CM | POA: Insufficient documentation

## 2010-08-25 DIAGNOSIS — E663 Overweight: Secondary | ICD-10-CM

## 2010-08-25 DIAGNOSIS — Q403 Congenital malformation of stomach, unspecified: Secondary | ICD-10-CM | POA: Insufficient documentation

## 2010-08-25 DIAGNOSIS — Q409 Congenital malformation of upper alimentary tract, unspecified: Secondary | ICD-10-CM

## 2010-08-25 DIAGNOSIS — I2510 Atherosclerotic heart disease of native coronary artery without angina pectoris: Secondary | ICD-10-CM

## 2010-08-25 DIAGNOSIS — E119 Type 2 diabetes mellitus without complications: Secondary | ICD-10-CM

## 2010-08-25 LAB — BASIC METABOLIC PANEL
CO2: 29 mEq/L (ref 19–32)
Calcium: 9.6 mg/dL (ref 8.4–10.5)
Sodium: 137 mEq/L (ref 135–145)

## 2010-08-25 NOTE — Progress Notes (Signed)
  Subjective:    Patient ID: Morgan [name], female    DOB: [birth year], 60 y.o.   MRN: [id number]  HPI Patient comes in today for followup of multiple medical problems.  DM : No significant change no known side effects of medicines doing well him back on no unusual infections or numbness. Byetta and her metformin. GI colon:  good but egd showed ulcer and bx with atypia  FU shows atypia with healed ulcer   .    Need ct scan to r/o other issues. She is a bit nervous about this and asks information. Because of her atrophic gastritis she is to get a B12 level here. No abd pain. Nausea vomiting at this time.  Coronary artery disease: Felt to be stable no change in medication. Lipids: Has been on LOvaza  but some insurance issues to switch to over-the-counter fish oil. Unsure whether she should stay on this or not. Gets burping with either of these unknown what her triglycerides were at the beginning when placed on this by her previous PCP.  Cervical area felt to be tag and cauterized  Saw dr Vincente Poli  Review of Systems Negative chest pain shortness of breath vomiting significant weight change fever or chills.    Objective:   Physical Exam WDWN in nad   Labs reviewed  Record reviewed    Lab Results  Component Value Date   HGBA1C 6.5 07/14/2010    Counseled.   Assessment & Plan:  DM  Stable  Agree with med management and not aware of any linkage with gastric atypia.  LIPids  : disc risk benefit and for now no change    If tg are good. Data is conflicting  GI Atrophic gastritis     And atypica by hx.   Check b12 today  And bmp pre ct scan.  Obesity  Continue weight loss. Attempts   Total visit > 50% spent counseling and coordinating care

## 2010-08-25 NOTE — Patient Instructions (Signed)
Will notify you  of labs when available. Stay on the byetta    Dont see a  Reason to stop the byetta. For now stay on the fish oil  As tolerated.  No change in meds.   Plan follow depending on results .

## 2010-08-30 ENCOUNTER — Encounter: Payer: Self-pay | Admitting: *Deleted

## 2010-09-08 ENCOUNTER — Ambulatory Visit (INDEPENDENT_AMBULATORY_CARE_PROVIDER_SITE_OTHER)
Admission: RE | Admit: 2010-09-08 | Discharge: 2010-09-08 | Disposition: A | Discharge status: Home / Self Care | Payer: PRIVATE HEALTH INSURANCE | Source: Ambulatory Visit | Attending: Internal Medicine | Admitting: Internal Medicine

## 2010-09-08 DIAGNOSIS — Q409 Congenital malformation of upper alimentary tract, unspecified: Secondary | ICD-10-CM

## 2010-09-08 DIAGNOSIS — Q403 Congenital malformation of stomach, unspecified: Secondary | ICD-10-CM

## 2010-09-08 MED ORDER — IOHEXOL 300 MG/ML  SOLN
100.0000 mL | Freq: Once | INTRAMUSCULAR | Status: AC | PRN
  Administered 2010-09-08: 100 mL via INTRAVENOUS

## 2010-09-11 ENCOUNTER — Telehealth: Payer: Self-pay | Admitting: *Deleted

## 2010-09-11 NOTE — Telephone Encounter (Signed)
Patient notified of results and Dr. Brodie's recommendation. 

## 2010-09-11 NOTE — Telephone Encounter (Signed)
Message copied by Daphine Deutscher on Mon Sep 11, 2010 10:25 AM ------      Message from: Hart Carwin      Created: Fri Sep 08, 2010  5:03 PM      Regarding: CT scan of the abdomen       Rene Kocher, please call pt with normal CT scan results. No cancer. We have her for repeat EGD in November 2012 to look for the healing ulcer.

## 2010-09-26 ENCOUNTER — Other Ambulatory Visit: Payer: Self-pay | Admitting: Internal Medicine

## 2010-09-27 ENCOUNTER — Telehealth: Payer: Self-pay | Admitting: *Deleted

## 2010-09-27 MED ORDER — ATORVASTATIN CALCIUM 40 MG PO TABS: 40.0000 mg | ORAL_TABLET | Freq: Every day | ORAL | Status: DC

## 2010-09-27 MED ORDER — METFORMIN HCL ER 500 MG PO TB24: 1000.0000 mg | ORAL_TABLET | Freq: Two times a day (BID) | ORAL | Status: DC

## 2010-09-27 MED ORDER — EZETIMIBE 10 MG PO TABS: 10.0000 mg | ORAL_TABLET | Freq: Every day | ORAL | Status: DC

## 2010-09-27 NOTE — Telephone Encounter (Signed)
rx called in

## 2010-10-27 ENCOUNTER — Ambulatory Visit (INDEPENDENT_AMBULATORY_CARE_PROVIDER_SITE_OTHER): Payer: PRIVATE HEALTH INSURANCE | Admitting: Internal Medicine

## 2010-10-27 ENCOUNTER — Telehealth: Payer: Self-pay | Admitting: *Deleted

## 2010-10-27 DIAGNOSIS — Z23 Encounter for immunization: Secondary | ICD-10-CM

## 2010-10-27 MED ORDER — LIFESCAN UNISTIK II LANCETS MISC: Status: DC

## 2010-10-27 MED ORDER — INSULIN PEN NEEDLE 31G X 5 MM MISC: Status: DC

## 2010-10-27 NOTE — Telephone Encounter (Signed)
rx sent to pharmacy

## 2010-10-27 NOTE — Telephone Encounter (Signed)
Refill on lifescan lancets and pen needles.

## 2010-11-27 ENCOUNTER — Telehealth: Payer: Self-pay | Admitting: Internal Medicine

## 2010-11-27 DIAGNOSIS — E119 Type 2 diabetes mellitus without complications: Secondary | ICD-10-CM

## 2010-11-27 NOTE — Telephone Encounter (Signed)
Pt called and said that [facility] has a problem with pts req for One Touch Ultra test strips, req to resend script. Pt also needs a script for Econazole Nitrate Cream 1 % 30 grams - 3 tubes for 3 months to [facility]. This cream used to be prescribed by Dr Larina Bras.

## 2010-11-29 MED ORDER — ECONAZOLE NITRATE 1 % EX CREA: TOPICAL_CREAM | Freq: Every day | CUTANEOUS | Status: DC

## 2010-11-29 MED ORDER — GLUCOSE BLOOD VI STRP: ORAL_STRIP | Status: DC

## 2010-11-29 NOTE — Telephone Encounter (Signed)
Pls advise on cream.

## 2010-11-29 NOTE — Telephone Encounter (Signed)
rx sent in to pharmacy for cream.

## 2010-11-29 NOTE — Telephone Encounter (Signed)
Ok to do

## 2010-12-04 ENCOUNTER — Other Ambulatory Visit: Payer: Self-pay | Admitting: Internal Medicine

## 2010-12-06 ENCOUNTER — Other Ambulatory Visit: Payer: Self-pay | Admitting: *Deleted

## 2010-12-06 ENCOUNTER — Other Ambulatory Visit: Payer: Self-pay | Admitting: Internal Medicine

## 2010-12-06 ENCOUNTER — Encounter: Payer: Self-pay | Admitting: Internal Medicine

## 2010-12-06 NOTE — Telephone Encounter (Signed)
Econazole cream is not covered by pt's ins. Pt would like to have something that is similar called into pharmacy.

## 2010-12-06 NOTE — Telephone Encounter (Signed)
most of the antifungals are not prescription  lamisil, lotrimin and monistat. Are OTC and dont need a  Scrip.  Would try one of these first ( i assume its for tinea  Pedis )

## 2010-12-06 NOTE — Telephone Encounter (Signed)
See previous note

## 2010-12-06 NOTE — Telephone Encounter (Signed)
Left message on machine about this. 

## 2010-12-06 NOTE — Telephone Encounter (Signed)
Pt needs endoscopy, not office visit. Morgan [name] states she will call patient back.

## 2011-01-19 ENCOUNTER — Ambulatory Visit: Payer: PRIVATE HEALTH INSURANCE | Admitting: Internal Medicine

## 2011-01-25 ENCOUNTER — Telehealth: Payer: Self-pay | Admitting: *Deleted

## 2011-01-25 ENCOUNTER — Ambulatory Visit (AMBULATORY_SURGERY_CENTER): Payer: PRIVATE HEALTH INSURANCE | Admitting: *Deleted

## 2011-01-25 VITALS — Ht 66.0 in | Wt 195.7 lb

## 2011-01-25 DIAGNOSIS — K257 Chronic gastric ulcer without hemorrhage or perforation: Secondary | ICD-10-CM

## 2011-01-25 NOTE — Telephone Encounter (Signed)
Dr. Juanda Chance, This pt had her PV this morning; her procedure is scheduled for 02-02-11 at 3:00.  She is having a repeat EGD to check her ulcer.  I gave her the prep instructions at her visit.  She firmly stated, "I cannot go all day without eating.  I have done this four times in the last year and I don't remember not eating.  I don't think I'll be able to do it"  I offered to change her appointment date to a morning appointment, but pt states that she is unable to change.  She states, "Could you please ask Dr. Juanda Chance if it is at all possible to have some food very early that morning."  Please advise  Thank you, Baxter Hire

## 2011-01-25 NOTE — Telephone Encounter (Signed)
Yes, she can have full liquids till 10.00 am, solid food till 8.00am. NPO after 10,00 am.

## 2011-01-25 NOTE — Telephone Encounter (Signed)
LMOM re: Dr. Regino Schultze instructions

## 2011-01-25 NOTE — Progress Notes (Signed)
Pt's appointment at 3:00 p.m. On 02-02-11.  She states, "There is no way I can go without eating all day like that.  I would have never have scheduled my appointment then if I knew that." Pt offered another appt time, but she states she has to be seen on the 01-23-11 d/t her schedule/   Writer called pt back and asked if she could come on 02-01-11 at 8:00.  Pt states that she will check her schedule and see if that is possible.  Pt called back and stated she cannot make the 02-01-11 appt.  She states, "I absolutely cannot go all day without eating.  Can you please, please ask Dr. Juanda Chance if I can eat something very early that morning?  I don't know if I can to the procedure if I cannot eat."  Note sent to Dr. Juanda Chance, explaining situation.

## 2011-01-26 ENCOUNTER — Ambulatory Visit (INDEPENDENT_AMBULATORY_CARE_PROVIDER_SITE_OTHER): Payer: PRIVATE HEALTH INSURANCE | Admitting: Cardiology

## 2011-01-26 ENCOUNTER — Encounter: Payer: Self-pay | Admitting: Cardiology

## 2011-01-26 VITALS — BP 116/68 | HR 65 | Ht 66.0 in | Wt 196.0 lb

## 2011-01-26 DIAGNOSIS — I2510 Atherosclerotic heart disease of native coronary artery without angina pectoris: Secondary | ICD-10-CM

## 2011-01-26 DIAGNOSIS — E785 Hyperlipidemia, unspecified: Secondary | ICD-10-CM

## 2011-01-26 DIAGNOSIS — K219 Gastro-esophageal reflux disease without esophagitis: Secondary | ICD-10-CM

## 2011-01-26 DIAGNOSIS — E119 Type 2 diabetes mellitus without complications: Secondary | ICD-10-CM

## 2011-01-26 DIAGNOSIS — E663 Overweight: Secondary | ICD-10-CM

## 2011-01-26 MED ORDER — OMEPRAZOLE 20 MG PO CPDR: 20.0000 mg | DELAYED_RELEASE_CAPSULE | Freq: Every day | ORAL | Status: DC

## 2011-01-26 NOTE — Assessment & Plan Note (Signed)
Stable. No change in treatment. See me back in the office in one year

## 2011-01-26 NOTE — Patient Instructions (Signed)
Your physician recommends that you schedule a follow-up appointment in: 1 year  

## 2011-01-26 NOTE — Progress Notes (Signed)
HPI Morgan [name] comes in today for evaluation and management of her coronary disease and history of myocardial infarction. She's doing remarkably well and has kept her weight down. Her last hemoglobin A1c was well below 7%. Lipids are at goal and her HDL is increased to 44. There is having no angina or chest discomfort. In long hours and no health and is very tired.  Medications reviewed and she is compliant. Has repeat endoscopy for some atypical gastric cells next Friday. She is out of her omeprazole  Past Medical History  Diagnosis Date  . DIABETES MELLITUS, TYPE II 12/09/2009  . HYPERLIPIDEMIA-MIXED 04/14/2008  . OVERWEIGHT/OBESITY 05/13/2009  . CAD, NATIVE VESSEL 05/13/2009  . Occult blood in stools   . Anemia     as a child   . Hx of abnormal cervical Pap smear     cryo  but normal since then   . Ulcer     Duodenal ulcer by x-ray 40 years ago  . Osteopenia   . Obesity   . Myocardial infarction     2003    Current Outpatient Prescriptions  Medication Sig Dispense Refill  . aspirin 81 MG tablet Take 81 mg by mouth daily.        Marland Kitchen atorvastatin (LIPITOR) 40 MG tablet Take 1 tablet (40 mg total) by mouth daily.  90 tablet  3  . cholecalciferol (VITAMIN D) 1000 UNIT tablet Take 5,000 Units by mouth daily.       . clindamycin (CLEOCIN) 150 MG capsule Take by mouth. 4 tabs prior to dental work       . econazole nitrate 1 % cream Apply topically daily. Please give pt 90 day supply.  30 g  0  . exenatide (BYETTA 10 MCG PEN) 10 MCG/0.04ML SOLN Use 0.57ml twice a day as directed.  3 pen  3  . ezetimibe (ZETIA) 10 MG tablet Take 1 tablet (10 mg total) by mouth daily.  90 tablet  3  . fish oil-omega-3 fatty acids 1000 MG capsule Take 2 g by mouth daily.       Marland Kitchen glucose blood (ONE TOUCH ULTRA TEST) test strip Test once daily.  100 each  3  . Insulin Pen Needle (B-D UF III MINI PEN NEEDLES) 31G X 5 MM MISC Use as directed  300 each  3  . LifeScan Unistik II Lancets MISC Check bid  600 each  3  .  metFORMIN (GLUCOPHAGE-XR) 500 MG 24 hr tablet Take 2 tablets (1,000 mg total) by mouth 2 (two) times daily with a meal.  360 tablet  3  . minocycline (MINOCIN,DYNACIN) 100 MG capsule Take 100 mg by mouth. Take as needed       . MULTIPLE VITAMINS PO Take by mouth.        Marland Kitchen omeprazole (PRILOSEC) 20 MG capsule Take 1 capsule (20 mg total) by mouth daily.  10 capsule  0    Allergies  Allergen Reactions  . Penicillins     REACTION: as a child    Family History  Problem Relation Age of Onset  . Emphysema Mother     Smoker  . Heart attack Mother     due to head injury  . Alcohol abuse Mother   . Heart disease Mother   . Early death Mother   . Lung cancer Father   . Heart attack Father   . Hypertension Father   . Alcohol abuse Father   . Heart disease Father   .  Aneurysm Father   . Alcohol abuse Brother   . Diabetes Brother   . Diabetes Brother   . Aneurysm Paternal Grandmother   . Diabetes      grandfather  . Colon cancer Neg Hx   . Esophageal cancer Neg Hx   . Stomach cancer Neg Hx     History   Social History  . Marital Status: Married    Spouse Name: N/A    Number of Children: N/A  . Years of Education: phd    Occupational History  . Mental Health Counselor     [employer]   Social History Main Topics  . Smoking status: Former Smoker    Quit date: 02/05/1994  . Smokeless tobacco: Never Used  . Alcohol Use: No  . Drug Use: No  . Sexually Active: Not on file   Other Topics Concern  . 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 Self employed. PHD education fom [place]. No firearms NO reg exercise Pets- PoodlesSleep 7 hours     ROS ALL NEGATIVE EXCEPT THOSE NOTED IN HPI  PE  General Appearance: well developed, well nourished in no acute distress HEENT: symmetrical face, PERRLA, good dentition  Neck: no JVD, thyromegaly, or adenopathy, trachea midline Chest: symmetric  without deformity Cardiac: PMI non-displaced, RRR, normal S1, S2, no gallop or murmur Lung: clear to ausculation and percussion Vascular: all pulses full without bruits  Abdominal: nondistended, nontender, good bowel sounds, no HSM, no bruits Extremities: no cyanosis, clubbing or edema, no sign of DVT, no varicosities  Skin: normal color, no rashes Neuro: alert and oriented x 3, non-focal Pysch: normal affect  EKG Normal sinus rhythm, first degree AV block, minimal Q waves inferiorly. BMET    Component Value Date/Time   NA 137 08/25/2010 0925   K 4.8 08/25/2010 0925   CL 99 08/25/2010 0925   CO2 29 08/25/2010 0925   GLUCOSE 128* 08/25/2010 0925   BUN 10 08/25/2010 0925   CREATININE 0.7 08/25/2010 0925   CALCIUM 9.6 08/25/2010 0925   GFRNONAA 92 02/03/2008 1000   GFRAA 111 02/03/2008 1000    Lipid Panel     Component Value Date/Time   CHOL 131 03/30/2010 0850   TRIG 53.0 03/30/2010 0850   HDL 44.30 03/30/2010 0850   CHOLHDL 3 03/30/2010 0850   VLDL 10.6 03/30/2010 0850   LDLCALC 76 03/30/2010 0850    CBC    Component Value Date/Time   WBC 9.0 03/30/2010 0850   RBC 4.42 03/30/2010 0850   HGB 14.1 03/30/2010 0850   HCT 41.6 03/30/2010 0850   PLT 189.0 03/30/2010 0850   MCV 94.2 03/30/2010 0850   MCHC 33.9 03/30/2010 0850   RDW 12.7 03/30/2010 0850   LYMPHSABS 1.2 03/30/2010 0850   MONOABS 0.6 03/30/2010 0850   EOSABS 0.0 03/30/2010 0850   BASOSABS 0.0 03/30/2010 0850

## 2011-02-02 ENCOUNTER — Ambulatory Visit (AMBULATORY_SURGERY_CENTER): Payer: PRIVATE HEALTH INSURANCE | Admitting: Internal Medicine

## 2011-02-02 ENCOUNTER — Encounter: Payer: Self-pay | Admitting: Internal Medicine

## 2011-02-02 VITALS — BP 118/47 | HR 66 | Temp 97.1°F | Resp 18 | Ht 66.0 in | Wt 195.0 lb

## 2011-02-02 DIAGNOSIS — K257 Chronic gastric ulcer without hemorrhage or perforation: Secondary | ICD-10-CM

## 2011-02-02 DIAGNOSIS — K2940 Chronic atrophic gastritis without bleeding: Secondary | ICD-10-CM

## 2011-02-02 DIAGNOSIS — K219 Gastro-esophageal reflux disease without esophagitis: Secondary | ICD-10-CM

## 2011-02-02 DIAGNOSIS — K2960 Other gastritis without bleeding: Secondary | ICD-10-CM

## 2011-02-02 DIAGNOSIS — Q403 Congenital malformation of stomach, unspecified: Secondary | ICD-10-CM

## 2011-02-02 DIAGNOSIS — Q409 Congenital malformation of upper alimentary tract, unspecified: Secondary | ICD-10-CM

## 2011-02-02 LAB — GLUCOSE, CAPILLARY
Glucose-Capillary: 107 mg/dL — ABNORMAL HIGH (ref 70–99)
Glucose-Capillary: 92 mg/dL (ref 70–99)

## 2011-02-02 MED ORDER — SODIUM CHLORIDE 0.9 % IV SOLN: 500.0000 mL | INTRAVENOUS | Status: DC

## 2011-02-02 MED ORDER — OMEPRAZOLE 20 MG PO CPDR: 20.0000 mg | DELAYED_RELEASE_CAPSULE | Freq: Every day | ORAL | Status: DC

## 2011-02-02 NOTE — Patient Instructions (Signed)
Prilosec prescription sent for 3 months to [facility] with 4 refills. Please refer to blue and green discharge instruction sheets.

## 2011-02-02 NOTE — Op Note (Signed)
[facility] [address] [place], [place]  [address]  ENDOSCOPY PROCEDURE REPORT  PATIENT:  Morgan [name], Morgan [name]  MR#:  [id number] BIRTHDATE:  Jul 17, 1950, 60 yrs. old  GENDER:  female  ENDOSCOPIST:  Hedwig Morton. Juanda Chance, MD Referred by:  Neta Mends. Panosh, M.D.  PROCEDURE DATE:  02/02/2011 PROCEDURE:  EGD with biopsy, 43239 ASA CLASS:  Class II INDICATIONS:  cellular atypia in a gastric ulcer 05/4010,,also intestinal metaplasia, pt ran out of Prelosec,having some dyspepsia  MEDICATIONS:   These medications were titrated to patient response per physician's verbal order, Versed 4 mg, Fentanyl 25 mcg TOPICAL ANESTHETIC:  Cetacaine Spray  DESCRIPTION OF PROCEDURE:   After the risks benefits and alternatives of the procedure were thoroughly explained, informed consent was obtained.  The LB [id number]-H180 [id number] endoscope was introduced through the mouth and advanced to the second portion of the duodenum, without limitations.  The instrument was slowly withdrawn as the mucosa was fully examined. <<PROCEDUREIMAGES>>  Esophagitis was found in the distal esophagus (see image1 and image2). 5 mm long linear erosion at the g-e junction  Mild gastritis was found (see image3, image4, image5, and image11). coffee groung specks of old blood throughout the stomach, mixed with bile  A healed ulcer was noted pyloric channel slightly irregular mucosa of the pylorus (see image9, image8, image6, and image7). friable mucosa anmd slight exudate c/w residual ulcer, no sign of an infiltrating tumor    Retroflexed views revealed no abnormalities.    The scope was then withdrawn from the patient and the procedure completed.  COMPLICATIONS:  None  ENDOSCOPIC IMPRESSION: 1) Esophagitis in the distal esophagus 2) Mild gastritis 3) Ulcer, healed in the pyloric channel RECOMMENDATIONS: 1) Await biopsy results prilosec 20 mg po qd, do not run out of it  REPEAT EXAM:  In 0 year(s) for.  no recall if  biopsies OK  ______________________________ Hedwig Morton. Juanda Chance, MD  CC:  n. eSIGNED:   Hedwig Morton. Brodie at 02/02/2011 03:41 PM  Madelaine Etienne, [phone]

## 2011-02-05 ENCOUNTER — Telehealth: Payer: Self-pay | Admitting: *Deleted

## 2011-02-05 NOTE — Telephone Encounter (Signed)
No answer, voice mail message left for the patient.

## 2011-02-07 ENCOUNTER — Other Ambulatory Visit: Payer: Self-pay | Admitting: *Deleted

## 2011-02-07 LAB — HM MAMMOGRAPHY

## 2011-02-08 ENCOUNTER — Encounter: Payer: Self-pay | Admitting: Internal Medicine

## 2011-02-10 ENCOUNTER — Other Ambulatory Visit: Payer: Self-pay | Admitting: Internal Medicine

## 2011-03-21 ENCOUNTER — Encounter: Payer: Self-pay | Admitting: *Deleted

## 2011-03-28 ENCOUNTER — Ambulatory Visit (INDEPENDENT_AMBULATORY_CARE_PROVIDER_SITE_OTHER): Payer: PRIVATE HEALTH INSURANCE | Admitting: Internal Medicine

## 2011-03-28 ENCOUNTER — Encounter: Payer: Self-pay | Admitting: Internal Medicine

## 2011-03-28 VITALS — BP 130/70 | HR 64 | Ht 65.75 in | Wt 193.0 lb

## 2011-03-28 DIAGNOSIS — K257 Chronic gastric ulcer without hemorrhage or perforation: Secondary | ICD-10-CM

## 2011-03-28 NOTE — Patient Instructions (Addendum)
Your omeprazole was already sent to [facility] for #90 with 4 refills at your Endoscopy. You will be due for a recall colonoscopy in December 2013. We will send you a reminder in the mail when it gets closer to that time. We will recheck your B12 levels at the next appointment. CC: Dr Fabian Sharp

## 2011-03-28 NOTE — Progress Notes (Signed)
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 An endoscopy in April 2012 showed a gastric ulcer with cellular atypia and intestinal metaplasia. She remains asymptomatic on Prilosec 20 mg daily. There is a history of a duodenal ulcer when she was in college. A colonoscopy in April 2012 was normal. A CT scan of the abdomen in July 2012 showed no acute findings. She is due for a recall upper endoscopy in December 2013.   Past Medical History  Diagnosis Date  . DIABETES MELLITUS, TYPE II 12/09/2009  . HYPERLIPIDEMIA-MIXED 04/14/2008  . OVERWEIGHT/OBESITY 05/13/2009  . CAD, NATIVE VESSEL 05/13/2009  . Occult blood in stools   . Anemia     as a child   . Hx of abnormal cervical Pap smear     cryo  but normal since then   . Ulcer     Duodenal ulcer by x-ray 40 years ago  . Osteopenia   . Obesity   . Myocardial infarction     2003  . Gastritis   . Gastric ulcer   . Intestinal metaplasia of gastric mucosa 2012   Past Surgical History  Procedure Date  . Tubal ligation     One Tube  . Dilation and curettage of uterus   . Fibula fracture surgery   . Closed reduction proximal tibiofibular joint dislocaton 2004    left  . Cesarean section     x2  . Tonsillectomy   . Ganglion cyst excision   . Colonoscopy   . Upper gastrointestinal endoscopy   . Ectopic pregnancy surgery     reports that she quit smoking about 17 years ago. She has never used smokeless tobacco. She reports that she does not drink alcohol or use illicit drugs. family history includes Alcohol abuse in her brother, father, and mother; Aneurysm in her father and paternal grandmother; Diabetes in her brothers and unspecified family member; Early death in her mother; Emphysema in her mother; Heart attack in her father and mother; Heart  disease in her father and mother; Hypertension in her father; and Lung cancer in her father.  There is no history of Colon cancer, and Esophageal cancer, and Stomach cancer, . Allergies  Allergen Reactions  . Penicillins     REACTION: as a child        Review of Systems: Denies abdominal pain. Indigestion. Dysphagia or odynophagia  The remainder of the 10 point ROS is negative except as outlined in H&P   Physical Exam: General appearance  Well developed, in no distress. Eyes- non icteric. HEENT nontraumatic, normocephalic. Mouth no lesions, tongue papillated, no cheilosis. Neck supple without adenopathy, thyroid not enlarged, no carotid bruits, no JVD. Lungs Clear to auscultation bilaterally. Cor normal S1, normal S2, regular rhythm, no murmur,  quiet precordium. Abdomen: Soft, nontender abdomen with normal active bowel sounds. Liver edge 2 cm below right costal margin.  Rectal: Not done Extremities no pedal edema. Skin no lesions. Neurological alert and oriented x 3. Psychological normal mood and affect.  Assessment and Plan:   Problem #1 Healed gastric ulcer. Patient remains asymptomatic. The biopsies showeed intestinal metaplasia and goblet cells. This lesion has to be followed endoscopically and she is scheduled for a repeat endoscopy for December 2013. She will remain on Prilosec 20 mg a day. We will repeat biopsies at  the next endoscopy.  Problem #2 Colorectal screening. A recall colonoscopy will be due in April 2022.  03/28/2011 Morgan [name]

## 2011-04-13 ENCOUNTER — Ambulatory Visit (INDEPENDENT_AMBULATORY_CARE_PROVIDER_SITE_OTHER): Payer: PRIVATE HEALTH INSURANCE | Admitting: Internal Medicine

## 2011-04-13 ENCOUNTER — Other Ambulatory Visit: Payer: Self-pay | Admitting: Internal Medicine

## 2011-04-13 ENCOUNTER — Encounter: Payer: Self-pay | Admitting: Internal Medicine

## 2011-04-13 VITALS — BP 130/80 | HR 66 | Wt 193.0 lb

## 2011-04-13 DIAGNOSIS — I2510 Atherosclerotic heart disease of native coronary artery without angina pectoris: Secondary | ICD-10-CM

## 2011-04-13 DIAGNOSIS — E663 Overweight: Secondary | ICD-10-CM

## 2011-04-13 DIAGNOSIS — E119 Type 2 diabetes mellitus without complications: Secondary | ICD-10-CM

## 2011-04-13 DIAGNOSIS — E785 Hyperlipidemia, unspecified: Secondary | ICD-10-CM

## 2011-04-13 MED ORDER — EXENATIDE 10 MCG/0.04ML ~~LOC~~ SOPN: PEN_INJECTOR | SUBCUTANEOUS | Status: DC

## 2011-04-13 NOTE — Patient Instructions (Signed)
Plan for labs this month then plan follow up or 6 months . Get eye exam.  Will refill meds  Ask Dr  Daleen Squibb  About beta blocker.

## 2011-04-13 NOTE — Progress Notes (Signed)
Subjective:    Patient ID: Morgan [name], female    DOB: 1950/05/08, 61 y.o.   MRN: [id number]  HPI Patient comes in today for follow up of  multiple medical problems.  DM:  95 and 175   Up and down.   Some lows after dinner  About a few months  Shaky . And drinks OJ.  byetta bid  Good compliance  No unusual infections  Eye changes  Or numbness   Or  Due for eye check.  CAD doing well.  Got something from insurance about being on b blocker  GERD: omeprezole    controls problem Sleep "not enough" Varicose veins left more than right no ulcerations  Review of Systems ROS:  GEN/ HEENT: No fever, significant weight changes sweats headaches vision problems hearing changes, CV/ PULM; No chest pain shortness of breath cough, syncope,edema  change in exercise tolerance. GI /GU: No adominal pain, vomiting, change in bowel habits.No significant GU symptoms. SKIN/HEME: ,no acute skin rashes suspicious lesions or bleeding. No lymphadenopathy, nodules, masses.  NEURO/ PSYCH:  No neurologic signs such as weakness numbness. No depression anxiety. IMM/ Allergy: No unusual infections.  Allergy .   REST of 12 system review negative except as per HPI  Past history family history social history reviewed in the electronic medical record.  Outpatient Prescriptions Prior to Visit  Medication Sig Dispense Refill  . aspirin 81 MG tablet Take 81 mg by mouth daily.        Marland Kitchen atorvastatin (LIPITOR) 40 MG tablet Take 1 tablet (40 mg total) by mouth daily.  90 tablet  3  . cholecalciferol (VITAMIN D) 1000 UNIT tablet Take 5,000 Units by mouth daily.       . clindamycin (CLEOCIN) 150 MG capsule Take by mouth. 4 tabs prior to dental work       . ezetimibe (ZETIA) 10 MG tablet Take 1 tablet (10 mg total) by mouth daily.  90 tablet  3  . fish oil-omega-3 fatty acids 1000 MG capsule Take 2 g by mouth daily.       Marland Kitchen glucose blood (ONE TOUCH ULTRA TEST) test strip Test once daily.  100 each  3  . Insulin Pen Needle  (B-D UF III MINI PEN NEEDLES) 31G X 5 MM MISC Use as directed  300 each  3  . LifeScan Unistik II Lancets MISC Check bid  600 each  3  . metFORMIN (GLUCOPHAGE-XR) 500 MG 24 hr tablet Take 2 tablets (1,000 mg total) by mouth 2 (two) times daily with a meal.  360 tablet  3  . minocycline (MINOCIN,DYNACIN) 100 MG capsule Take 100 mg by mouth. Take as needed       . MULTIPLE VITAMINS PO Take by mouth.        Marland Kitchen omeprazole (PRILOSEC) 20 MG capsule Take 1 capsule (20 mg total) by mouth daily.  90 capsule  4  . exenatide (BYETTA 10 MCG PEN) 10 MCG/0.04ML SOLN Inject bid Pt needs to schedule a follow up before the next refill  3 mL  0   Facility-Administered Medications Prior to Visit  Medication Dose Route Frequency Provider Last Rate Last Dose  . 0.9 %  sodium chloride infusion  500 mL Intravenous Continuous Hart Carwin, MD            Objective:   Physical Exam Physical Exam: Vital signs reviewed WUJ:WJXB is a well-developed well-nourished alert cooperative  white female who appears her stated age in no  acute distress.  HEENT: normocephalic atraumatic , Eyes: PERRL EOM's full, conjunctiva clear, Nares: paten,t no deformity discharge or tenderness., Ears: no deformity EAC's clear TMs with normal landmarks. Mouth: clear OP, no lesions, edema.  Moist mucous membranes. Dentition in adequate repair. NECK: supple without masses, thyromegaly or bruits. CHEST/PULM:  Clear to auscultation and percussion breath sounds equal no wheeze , rales or rhonchi. No chest wall deformities or tenderness. CV: PMI is nondisplaced, S1 S2 no gallops, murmurs, rubs. Peripheral pulses are full without delay.No JVD .  ABDOMEN: Bowel sounds normal nontender  No guard or rebound, no hepato splenomegal no CVA tenderness.   Extremtities:  No clubbing cyanosis or edema, no acute joint swelling or redness no focal atrophy NEURO:  Oriented x3, cranial nerves 3-12 appear to be intact, no obvious focal weakness,gait within  normal limits no abnormal reflexes SKIN: No acute rashes normal turgor, color, no bruising or petechiae. Small poss keratosis right leg  PSYCH: Oriented, good eye contact, no obvious depression anxiety, cognition and judgment appear normal. LN: no cervicall adenopathy        Assessment & Plan:  DM due for labs     Get eye check  No obv complications .  No se of meds noted CAD stable  Per yearly check per Dr Daleen Squibb.  Varicose veins  Disc options  Vascular practices   LIPIDS  No se of current meds   GERD hx of atrophic gastritis  Stable on prilosec

## 2011-04-26 ENCOUNTER — Other Ambulatory Visit (INDEPENDENT_AMBULATORY_CARE_PROVIDER_SITE_OTHER): Payer: PRIVATE HEALTH INSURANCE

## 2011-04-26 DIAGNOSIS — E119 Type 2 diabetes mellitus without complications: Secondary | ICD-10-CM

## 2011-04-26 DIAGNOSIS — E785 Hyperlipidemia, unspecified: Secondary | ICD-10-CM

## 2011-04-26 LAB — CBC WITH DIFFERENTIAL/PLATELET
Basophils Absolute: 0 10*3/uL (ref 0.0–0.1)
HCT: 40.2 % (ref 36.0–46.0)
Hemoglobin: 13.5 g/dL (ref 12.0–15.0)
Lymphs Abs: 1.5 10*3/uL (ref 0.7–4.0)
MCHC: 33.6 g/dL (ref 30.0–36.0)
MCV: 92.8 fl (ref 78.0–100.0)
Monocytes Absolute: 0.3 10*3/uL (ref 0.1–1.0)
Monocytes Relative: 6.5 % (ref 3.0–12.0)
Neutro Abs: 3.1 10*3/uL (ref 1.4–7.7)
RDW: 13 % (ref 11.5–14.6)

## 2011-04-26 LAB — BASIC METABOLIC PANEL
Chloride: 96 mEq/L (ref 96–112)
Potassium: 4.1 mEq/L (ref 3.5–5.1)

## 2011-04-26 LAB — HEPATIC FUNCTION PANEL
ALT: 26 U/L (ref 0–35)
AST: 23 U/L (ref 0–37)
Bilirubin, Direct: 0.1 mg/dL (ref 0.0–0.3)
Total Bilirubin: 0.7 mg/dL (ref 0.3–1.2)

## 2011-04-26 LAB — URINALYSIS
Bilirubin Urine: NEGATIVE
Hgb urine dipstick: NEGATIVE
Total Protein, Urine: NEGATIVE
Urine Glucose: NEGATIVE

## 2011-04-26 LAB — LIPID PANEL
LDL Cholesterol: 63 mg/dL (ref 0–99)
VLDL: 11.2 mg/dL (ref 0.0–40.0)

## 2011-04-26 LAB — HEMOGLOBIN A1C: Hgb A1c MFr Bld: 6.3 % (ref 4.6–6.5)

## 2011-04-26 LAB — MICROALBUMIN / CREATININE URINE RATIO: Microalb, Ur: 0.2 mg/dL (ref 0.0–1.9)

## 2011-05-11 NOTE — Progress Notes (Signed)
Quick Note:  Left a message for pt to return call. ______ 

## 2011-05-11 NOTE — Progress Notes (Signed)
Quick Note:  Spoke with pt ______ 

## 2011-05-12 ENCOUNTER — Other Ambulatory Visit: Payer: Self-pay | Admitting: Internal Medicine

## 2011-05-16 ENCOUNTER — Other Ambulatory Visit: Payer: Self-pay

## 2011-05-16 MED ORDER — EXENATIDE 10 MCG/0.04ML ~~LOC~~ SOPN: PEN_INJECTOR | SUBCUTANEOUS | Status: DC

## 2011-05-16 NOTE — Telephone Encounter (Signed)
Rx sent to [facility]

## 2011-05-22 ENCOUNTER — Telehealth: Payer: Self-pay | Admitting: Internal Medicine

## 2011-05-22 NOTE — Telephone Encounter (Signed)
Patient called stating that she need a refill on her Byetta and it has to be approved through [facility] for [employer] insurance. Please assist. Please call patient when done. [phone].

## 2011-05-28 NOTE — Telephone Encounter (Signed)
Sent to [facility] on 05/16/11.

## 2011-05-30 ENCOUNTER — Telehealth: Payer: Self-pay | Admitting: Internal Medicine

## 2011-05-30 NOTE — Telephone Encounter (Signed)
Pt called to check on status of getting Byetta Pen. Byetta pen has to be approved through [facility] for [employer] insurance to [place]. Pt said that normally the nurse has to complete a special form, that should be in her chart, in order to insurance to approve the pen. Please assist. Pt has less than a month supply left so she is req this to be done asap for 3 month supply.Please call patient when done. [phone].

## 2011-05-30 NOTE — Telephone Encounter (Signed)
Spoke with [place] and a PA will be started.    Called to make pt aware.

## 2011-05-31 NOTE — Telephone Encounter (Signed)
Morgan [name] -  The Byetta has been approved.

## 2011-06-02 ENCOUNTER — Other Ambulatory Visit: Payer: Self-pay | Admitting: Internal Medicine

## 2011-06-04 NOTE — Telephone Encounter (Signed)
Left a detailed message to make pt aware.

## 2011-08-09 ENCOUNTER — Other Ambulatory Visit: Payer: Self-pay | Admitting: Internal Medicine

## 2011-08-22 ENCOUNTER — Encounter: Payer: Self-pay | Admitting: Internal Medicine

## 2011-10-13 ENCOUNTER — Other Ambulatory Visit: Payer: Self-pay | Admitting: Internal Medicine

## 2011-10-15 ENCOUNTER — Other Ambulatory Visit: Payer: Self-pay | Admitting: Internal Medicine

## 2011-10-16 ENCOUNTER — Other Ambulatory Visit: Payer: Self-pay | Admitting: Family Medicine

## 2011-10-18 ENCOUNTER — Other Ambulatory Visit: Payer: Self-pay | Admitting: Cardiology

## 2011-10-18 MED ORDER — ATORVASTATIN CALCIUM 40 MG PO TABS: 40.0000 mg | ORAL_TABLET | Freq: Every day | ORAL | Status: DC

## 2011-10-18 NOTE — Telephone Encounter (Signed)
Pt needs refill of lipitor 40mg  90 days no refills, will be getting new insurance in January will change pharm in Jan, [place] mail order

## 2011-10-19 ENCOUNTER — Ambulatory Visit: Payer: PRIVATE HEALTH INSURANCE | Admitting: Internal Medicine

## 2011-11-30 ENCOUNTER — Encounter: Payer: Self-pay | Admitting: Internal Medicine

## 2011-11-30 ENCOUNTER — Ambulatory Visit (INDEPENDENT_AMBULATORY_CARE_PROVIDER_SITE_OTHER): Payer: PRIVATE HEALTH INSURANCE | Admitting: Internal Medicine

## 2011-11-30 VITALS — BP 130/74 | HR 73 | Temp 98.4°F | Wt 195.0 lb

## 2011-11-30 DIAGNOSIS — E119 Type 2 diabetes mellitus without complications: Secondary | ICD-10-CM

## 2011-11-30 DIAGNOSIS — B353 Tinea pedis: Secondary | ICD-10-CM

## 2011-11-30 DIAGNOSIS — E785 Hyperlipidemia, unspecified: Secondary | ICD-10-CM

## 2011-11-30 DIAGNOSIS — I2510 Atherosclerotic heart disease of native coronary artery without angina pectoris: Secondary | ICD-10-CM

## 2011-11-30 LAB — LIPID PANEL
Cholesterol: 147 mg/dL (ref 0–200)
Triglycerides: 62 mg/dL (ref 0.0–149.0)
VLDL: 12.4 mg/dL (ref 0.0–40.0)

## 2011-11-30 LAB — BASIC METABOLIC PANEL
BUN: 10 mg/dL (ref 6–23)
Chloride: 100 mEq/L (ref 96–112)
Creatinine, Ser: 0.7 mg/dL (ref 0.4–1.2)
Glucose, Bld: 121 mg/dL — ABNORMAL HIGH (ref 70–99)
Potassium: 5.5 mEq/L — ABNORMAL HIGH (ref 3.5–5.1)

## 2011-11-30 MED ORDER — ECONAZOLE NITRATE 1 % EX CREA: TOPICAL_CREAM | Freq: Two times a day (BID) | CUTANEOUS | Status: DC

## 2011-11-30 NOTE — Progress Notes (Signed)
  Subjective:    Patient ID: Morgan [name], female    DOB: 1951-01-15, 61 y.o.   MRN: [id number]  HPI Patient comes in today for follow up of  multiple medical problems.  Since her last visit 6 months ago unfortunately her brother died of sudden cardiac death. She's dealing with this appropriately. But it has been stressful she has taken [place]'s wort occasionally but it is really making a difference. Diabetes blood sugar seems to be pretty steady running 130 140 and low 85  Rare low feeling usually after  Eating and taking her medication byetta and metformin.  Has recurrent foot dermatitis off and on over the years has used various over-the-counter products but he, sole topical has worked in the past. She uses it twice a day until calms down. Asks to recheck her thyroid test. Hyperlipidemia on Lipitor. Coronary artery disease noncritical on aspirin family history of heart disease. Review of Systems No changes in fever, chest pain shortness of breath change in bowel habits bleeding has numbness and 1 great toe but no ulcers or progression. Past history family history social history reviewed in the electronic medical record.    Objective:   Physical Exam  BP 130/74  Pulse 73  Temp 98.4 F (36.9 C) (Oral)  Wt 195 lb (88.451 kg)  SpO2 97% Well-developed well-nourished in no acute distress normocephalic eyes clear neck supple without bruit heard or masses chest he PA BS equal cardiac S1-S2 gallops murmurs abdomen soft without megaly guarding or rebound no bruits are heard peripheral pulses are present normal capillary refill examination of sole of left foot shows dermatitis and cracking in between the middle toes. No ulcers or unusual calluses otherwise. Mood appears stable good eye contact. Wt Readings from Last 3 Encounters:  11/30/11 195 lb (88.451 kg)  04/13/11 193 lb (87.544 kg)  03/28/11 193 lb (87.544 kg)    Lab Results  Component Value Date   WBC 5.0 04/26/2011   HGB 13.5  04/26/2011   HCT 40.2 04/26/2011   PLT 197.0 04/26/2011   GLUCOSE 127* 04/26/2011   CHOL 124 04/26/2011   TRIG 56.0 04/26/2011   HDL 49.40 04/26/2011   LDLCALC 63 04/26/2011   ALT 26 04/26/2011   AST 23 04/26/2011   NA 133* 04/26/2011   K 4.1 04/26/2011   CL 96 04/26/2011   CREATININE 0.6 04/26/2011   BUN 10 04/26/2011   CO2 27 04/26/2011   TSH 2.44 04/26/2011   HGBA1C 6.3 04/26/2011   MICROALBUR 0.2 04/26/2011       Assessment & Plan:  DM apparently good control few side effects of medicine continue at this time and check her A1c lipids tsh   Flu vaccine elsewhere FEET looks like recurrent athletes feet with peeling an cracking has used other  Medication  Recent loss of  Sib SCD    Hyperlipidemia  On meds no se Weight; Obesity  basically the same  Labs to day and fu 6 months cpx or if indicated by labs on review

## 2011-11-30 NOTE — Patient Instructions (Addendum)
Will notify you  of labs when available. Get a flu vaccine. For now avoid other supplements unless check out med interactions. Wellness visit with labs and a1c in 6 months or as needed. shingles vaccine if wishes.

## 2011-12-05 ENCOUNTER — Other Ambulatory Visit: Payer: Self-pay | Admitting: Family Medicine

## 2011-12-05 DIAGNOSIS — E875 Hyperkalemia: Secondary | ICD-10-CM

## 2011-12-17 ENCOUNTER — Other Ambulatory Visit (INDEPENDENT_AMBULATORY_CARE_PROVIDER_SITE_OTHER): Payer: PRIVATE HEALTH INSURANCE

## 2011-12-17 DIAGNOSIS — E875 Hyperkalemia: Secondary | ICD-10-CM

## 2011-12-17 LAB — BASIC METABOLIC PANEL
CO2: 25 mEq/L (ref 19–32)
Glucose, Bld: 115 mg/dL — ABNORMAL HIGH (ref 70–99)
Potassium: 4 mEq/L (ref 3.5–5.1)
Sodium: 136 mEq/L (ref 135–145)

## 2011-12-19 ENCOUNTER — Encounter: Payer: Self-pay | Admitting: Internal Medicine

## 2011-12-20 ENCOUNTER — Other Ambulatory Visit: Payer: Self-pay | Admitting: Family Medicine

## 2011-12-20 MED ORDER — INSULIN PEN NEEDLE 31G X 5 MM MISC: Status: DC

## 2011-12-28 ENCOUNTER — Other Ambulatory Visit: Payer: Self-pay | Admitting: Internal Medicine

## 2011-12-28 ENCOUNTER — Other Ambulatory Visit: Payer: Self-pay | Admitting: Dermatology

## 2011-12-28 DIAGNOSIS — E119 Type 2 diabetes mellitus without complications: Secondary | ICD-10-CM

## 2011-12-28 MED ORDER — GLUCOSE BLOOD VI STRP: ORAL_STRIP | Status: DC

## 2012-01-07 ENCOUNTER — Other Ambulatory Visit: Payer: Self-pay | Admitting: Family Medicine

## 2012-01-07 DIAGNOSIS — E119 Type 2 diabetes mellitus without complications: Secondary | ICD-10-CM

## 2012-01-17 ENCOUNTER — Encounter: Payer: Self-pay | Admitting: Cardiology

## 2012-01-17 ENCOUNTER — Ambulatory Visit (INDEPENDENT_AMBULATORY_CARE_PROVIDER_SITE_OTHER): Payer: PRIVATE HEALTH INSURANCE | Admitting: Cardiology

## 2012-01-17 VITALS — BP 122/80 | HR 68 | Ht 66.0 in | Wt 197.0 lb

## 2012-01-17 DIAGNOSIS — I440 Atrioventricular block, first degree: Secondary | ICD-10-CM | POA: Insufficient documentation

## 2012-01-17 DIAGNOSIS — I2510 Atherosclerotic heart disease of native coronary artery without angina pectoris: Secondary | ICD-10-CM

## 2012-01-17 NOTE — Patient Instructions (Addendum)
Your physician recommends that you continue on your current medications as directed. Please refer to the Current Medication list given to you today.  Your physician wants you to follow-up in: 1 year with Dr. Daleen Squibb. You will receive a reminder letter in the mail two months in advance. If you don't receive a letter, please call our office to schedule the follow-up appointment.  Continue to exercise regularly

## 2012-01-17 NOTE — Assessment & Plan Note (Signed)
No change. Asymptomatic. Repeat EKG in a year.

## 2012-01-17 NOTE — Assessment & Plan Note (Signed)
Stable. Continue secondary preventative therapy. I've encouraged her to exercise 3 hours a week.

## 2012-01-17 NOTE — Progress Notes (Signed)
HPI Morgan [name] returns today for evaluation and management of her history of coronary artery disease and myocardial infarction. She had PCI at that time the circumflex. She's had good recovery of LV function. This happened about 10 years ago.  Her weight is up about 8 pounds from last year. She is not exercising because of being too busy. She denies any angina or ischemic symptoms. She denies orthopnea, PND or edema. Her last hemoglobin A1c was 6.7% and her lipids were at goal. Her LDL has increased some since last year. This probably reflects her weight.  Past Medical History  Diagnosis Date  . DIABETES MELLITUS, TYPE II 12/09/2009  . HYPERLIPIDEMIA-MIXED 04/14/2008  . OVERWEIGHT/OBESITY 05/13/2009  . CAD, NATIVE VESSEL 05/13/2009  . Occult blood in stools   . Anemia     as a child   . Hx of abnormal cervical Pap smear     cryo  but normal since then   . Ulcer     Duodenal ulcer by x-ray 40 years ago  . Osteopenia   . Obesity   . Myocardial infarction     2003  . Gastritis   . Gastric ulcer   . Intestinal metaplasia of gastric mucosa 2012    Current Outpatient Prescriptions  Medication Sig Dispense Refill  . aspirin 81 MG tablet Take 81 mg by mouth daily.        Marland Kitchen atorvastatin (LIPITOR) 40 MG tablet Take 1 tablet (40 mg total) by mouth daily.  90 tablet  1  . cholecalciferol (VITAMIN D) 1000 UNIT tablet Take 5,000 Units by mouth daily.       . clindamycin (CLEOCIN) 150 MG capsule Take by mouth. 4 tabs prior to dental work       . econazole nitrate 1 % cream Apply topically 2 (two) times daily. To affected area  30 g  2  . exenatide (BYETTA 10 MCG PEN) 10 MCG/0.04ML SOLN Inject bid  9 mL  3  . fish oil-omega-3 fatty acids 1000 MG capsule Take 2 g by mouth daily.       Marland Kitchen glucose blood (ONE TOUCH ULTRA TEST) test strip Test once daily.  100 each  11  . Insulin Pen Needle (B-D UF III MINI PEN NEEDLES) 31G X 5 MM MISC Use as directed  300 each  3  . Lancets (ONETOUCH ULTRASOFT)  lancets USE 1 DAILY  100 each  2  . metFORMIN (GLUCOPHAGE-XR) 500 MG 24 hr tablet TAKE 2 TABLETS TWICE A DAY WITH A MEAL  360 tablet  5  . minocycline (MINOCIN,DYNACIN) 100 MG capsule Take 100 mg by mouth. Take as needed       . MULTIPLE VITAMINS PO Take by mouth.        Marland Kitchen omeprazole (PRILOSEC) 20 MG capsule Take 1 capsule (20 mg total) by mouth daily.  90 capsule  4  . ZETIA 10 MG tablet TAKE 1 TABLET DAILY  90 tablet  1    Allergies  Allergen Reactions  . Penicillins     REACTION: as a child    Family History  Problem Relation Age of Onset  . Emphysema Mother     Smoker  . Heart attack Mother     due to head injury  . Alcohol abuse Mother   . Heart disease Mother   . Early death Mother   . Lung cancer Father   . Heart attack Father   . Hypertension Father   . Alcohol abuse  Father   . Heart disease Father   . Aneurysm Father   . Alcohol abuse Brother   . Diabetes Brother   . Diabetes Brother   . Aneurysm Paternal Grandmother   . Diabetes      grandfather  . Colon cancer Neg Hx   . Esophageal cancer Neg Hx   . Stomach cancer Neg Hx   . Sudden death Brother     scd    History   Social History  . Marital Status: Married    Spouse Name: N/A    Number of Children: 2  . Years of Education: phd    Occupational History  . 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 Smoking status: Former Smoker    Quit date: 02/05/1994  . Smokeless tobacco: Never Used  . Alcohol Use: No  . Drug Use: No  . Sexually Active: Not on file   Other Topics Concern  . 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 Self employed. PHD education fom [place]. No firearms NO reg exercise Pets- Poodles 2 Sleep 7 hours     ROS ALL NEGATIVE EXCEPT THOSE NOTED IN HPI  PE  General Appearance: well developed, well nourished  in no acute distress, overweight HEENT: symmetrical face, PERRLA, good dentition  Neck: no JVD, thyromegaly, or adenopathy, trachea midline Chest: symmetric without deformity Cardiac: PMI non-displaced, RRR, normal S1, S2, no gallop or murmur Lung: clear to ausculation and percussion Vascular: all pulses full without bruits  Abdominal: nondistended, nontender, good bowel sounds, no HSM, no bruits Extremities: no cyanosis, clubbing or edema, no sign of DVT, no varicosities  Skin: normal color, no rashes Neuro: alert and oriented x 3, non-focal Pysch: normal affect  EKG Normal sinus rhythm first-degree AV block, no acute changes. BMET    Component Value Date/Time   NA 136 12/17/2011 0801   K 4.0 12/17/2011 0801   CL 103 12/17/2011 0801   CO2 25 12/17/2011 0801   GLUCOSE 115* 12/17/2011 0801   BUN 12 12/17/2011 0801   CREATININE 0.6 12/17/2011 0801   CALCIUM 8.9 12/17/2011 0801   GFRNONAA 92 02/03/2008 1000   GFRAA 111 02/03/2008 1000    Lipid Panel     Component Value Date/Time   CHOL 147 11/30/2011 0856   TRIG 62.0 11/30/2011 0856   HDL 46.10 11/30/2011 0856   CHOLHDL 3 11/30/2011 0856   VLDL 12.4 11/30/2011 0856   LDLCALC 89 11/30/2011 0856    CBC    Component Value Date/Time   WBC 5.0 04/26/2011 0752   RBC 4.33 04/26/2011 0752   HGB 13.5 04/26/2011 0752   HCT 40.2 04/26/2011 0752   PLT 197.0 04/26/2011 0752   MCV 92.8 04/26/2011 0752   MCHC 33.6 04/26/2011 0752   RDW 13.0 04/26/2011 0752   LYMPHSABS 1.5 04/26/2011 0752   MONOABS 0.3 04/26/2011 0752   EOSABS 0.0 04/26/2011 0752   BASOSABS 0.0 04/26/2011 0752

## 2012-03-04 ENCOUNTER — Other Ambulatory Visit: Payer: Self-pay | Admitting: Internal Medicine

## 2012-03-05 ENCOUNTER — Encounter: Payer: Self-pay | Admitting: Internal Medicine

## 2012-03-05 NOTE — Progress Notes (Signed)
I have spoken to Dr Juanda Chance regarding patient's recall for endoscopy. Initially, after endoscopy biopsies came back from 02/02/11 endoscopy, a letter was sent to patient for 3-5 year recall. However, at her office visit 03/28/11, the office note indicated that she should be due for recall endoscopy in 01/2012. This was written in error and after Dr Regino Schultze more recent review, patient should actually be due for recall endoscopy 01/2014. Recall will be updated to read 01/2014.

## 2012-03-22 ENCOUNTER — Other Ambulatory Visit: Payer: Self-pay

## 2012-05-12 ENCOUNTER — Other Ambulatory Visit: Payer: Self-pay | Admitting: *Deleted

## 2012-05-12 MED ORDER — ATORVASTATIN CALCIUM 40 MG PO TABS: 40.0000 mg | ORAL_TABLET | Freq: Every day | ORAL | Status: DC

## 2012-05-24 ENCOUNTER — Other Ambulatory Visit: Payer: Self-pay | Admitting: Internal Medicine

## 2012-05-27 ENCOUNTER — Telehealth: Payer: Self-pay | Admitting: Family Medicine

## 2012-05-27 NOTE — Telephone Encounter (Signed)
This patient should have returned this month for a physical with [facility].  I do not see where one is scheduled.  Please contact the patient and make 30 minute appt for CPE.  I have spoke to her.  Will refill her Byetta.  Thanks!!

## 2012-05-27 NOTE — Telephone Encounter (Signed)
I have not spoke to her.

## 2012-05-27 NOTE — Telephone Encounter (Signed)
lmom for pt to call back

## 2012-05-27 NOTE — Telephone Encounter (Signed)
Pt is sch for cpx on 5-16

## 2012-06-20 ENCOUNTER — Ambulatory Visit (INDEPENDENT_AMBULATORY_CARE_PROVIDER_SITE_OTHER): Payer: BC Managed Care – PPO | Admitting: Internal Medicine

## 2012-06-20 ENCOUNTER — Encounter: Payer: Self-pay | Admitting: Internal Medicine

## 2012-06-20 VITALS — BP 136/70 | HR 59 | Temp 98.3°F | Ht 65.5 in | Wt 193.0 lb

## 2012-06-20 DIAGNOSIS — Z79899 Other long term (current) drug therapy: Secondary | ICD-10-CM | POA: Insufficient documentation

## 2012-06-20 DIAGNOSIS — E785 Hyperlipidemia, unspecified: Secondary | ICD-10-CM

## 2012-06-20 DIAGNOSIS — Z0000 Encounter for general adult medical examination without abnormal findings: Secondary | ICD-10-CM

## 2012-06-20 DIAGNOSIS — I2510 Atherosclerotic heart disease of native coronary artery without angina pectoris: Secondary | ICD-10-CM

## 2012-06-20 DIAGNOSIS — E119 Type 2 diabetes mellitus without complications: Secondary | ICD-10-CM

## 2012-06-20 LAB — CBC WITH DIFFERENTIAL/PLATELET
Eosinophils Absolute: 0 10*3/uL (ref 0.0–0.7)
Eosinophils Relative: 0.4 % (ref 0.0–5.0)
Lymphocytes Relative: 28.5 % (ref 12.0–46.0)
MCHC: 34.7 g/dL (ref 30.0–36.0)
MCV: 88.7 fl (ref 78.0–100.0)
Monocytes Absolute: 0.3 10*3/uL (ref 0.1–1.0)
Neutrophils Relative %: 64.4 % (ref 43.0–77.0)
Platelets: 193 10*3/uL (ref 150.0–400.0)
WBC: 5.3 10*3/uL (ref 4.5–10.5)

## 2012-06-20 LAB — HEPATIC FUNCTION PANEL
ALT: 25 U/L (ref 0–35)
Albumin: 4.1 g/dL (ref 3.5–5.2)
Alkaline Phosphatase: 69 U/L (ref 39–117)
Bilirubin, Direct: 0.1 mg/dL (ref 0.0–0.3)
Total Protein: 6.9 g/dL (ref 6.0–8.3)

## 2012-06-20 LAB — BASIC METABOLIC PANEL
BUN: 11 mg/dL (ref 6–23)
Calcium: 9.7 mg/dL (ref 8.4–10.5)
Chloride: 101 mEq/L (ref 96–112)
Creatinine, Ser: 0.7 mg/dL (ref 0.4–1.2)

## 2012-06-20 LAB — TSH: TSH: 2.31 u[IU]/mL (ref 0.35–5.50)

## 2012-06-20 LAB — MICROALBUMIN / CREATININE URINE RATIO
Creatinine,U: 35.6 mg/dL
Microalb Creat Ratio: 1.7 mg/g (ref 0.0–30.0)

## 2012-06-20 LAB — LIPID PANEL
Total CHOL/HDL Ratio: 3
Triglycerides: 66 mg/dL (ref 0.0–149.0)

## 2012-06-20 MED ORDER — GLUCOSE BLOOD VI STRP: ORAL_STRIP | Status: DC

## 2012-06-20 NOTE — Progress Notes (Signed)
Chief Complaint  Patient presents with  . Annual Exam    HPI: Patient comes in today for Preventive Health Care visit  Since her last visit her health has been generally well however Husband died 2 weeks ago  and is still a bit reeling from that although he is back to work. That is been helpful.  Sugars up some 150 or so   170  This am 124  Some   On maximum dose metformin Byetta  ocass  Forgetting . But usually takes on time. ocass low bg after dinner.  Every 3 - 4 months.  Or less. Asks if she could have more strips to be able to check her readings twice a day on some days  Hands and feet cold     Numbness big toes. When this happens otherwise no neuropathy symptoms Sleep  About the same 6-7 hours .  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 No current active chest pain shortness of breath syncope. ROS:  GEN/ HEENT: No fever, significant weight changes sweats headaches vision problems hearing changes, CV/ PULM; No chest pain shortness of breath cough, syncope,edema  change in exercise tolerance. GI /GU: No adominal pain, vomiting, change in bowel habits. No blood in the stool. No significant GU symptoms. SKIN/HEME: ,no acute skin rashes suspicious lesions or bleeding. No lymphadenopathy, nodules, masses.  NEURO/ PSYCH:  No neurologic signs such as weakness numbness. No depression anxiety. IMM/ Allergy: No unusual infections.  Allergy .   REST of 12 system review negative except as per HPI   Past Medical History  Diagnosis Date  . DIABETES MELLITUS, TYPE II 12/09/2009  . HYPERLIPIDEMIA-MIXED 04/14/2008  . OVERWEIGHT/OBESITY 05/13/2009  . CAD, NATIVE VESSEL 05/13/2009  . Occult blood in stools   . Anemia     as a child   . Hx of abnormal cervical Pap smear     cryo  but normal since then   . Ulcer     Duodenal ulcer by x-ray 40 years ago  . Osteopenia   . Obesity   . Myocardial infarction      2003  . Gastritis   . Gastric ulcer   . Intestinal metaplasia of gastric mucosa 2012     Past Surgical History  Procedure Laterality Date  . Tubal ligation      One Tube  . Dilation and curettage of uterus    . Fibula fracture surgery    . 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 Cesarean section      x2  . Tonsillectomy    . Ganglion cyst excision    . Colonoscopy    . Upper gastrointestinal endoscopy    . Ectopic pregnancy surgery      Family History  Problem Relation Age of Onset  . Emphysema Mother     Smoker  . Heart attack Mother     due to head injury  . Alcohol abuse Mother   . Heart disease Mother   . Early death Mother   . Lung cancer Father   . Heart attack Father   . Hypertension Father   . Alcohol abuse Father   . Heart disease Father   . Aneurysm Father   . Alcohol abuse Brother   . Diabetes Brother   . Diabetes Brother   . Aneurysm Paternal Grandmother   . Diabetes  grandfather  . Colon cancer Neg Hx   . Esophageal cancer Neg Hx   . Stomach cancer Neg Hx   . Sudden death Brother     scd    History   Social History  . Marital Status: Married    Spouse Name: N/A    Number of Children: 2  . Years of Education: phd    Occupational History  . 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 Smoking status: Former Smoker    Quit date: 02/05/1994  . Smokeless tobacco: Never Used  . Alcohol Use: No  . Drug Use: No  . Sexually Active: None   Other Topics Concern  . 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    No firearms    NO reg exercise    Pets- Poodles 2    Sleep 7 hours     Outpatient Encounter Prescriptions as of 06/20/2012  Medication  Sig Dispense Refill  . aspirin 81 MG tablet Take 81 mg by mouth daily.        Marland Kitchen atorvastatin (LIPITOR) 40 MG tablet Take 1 tablet (40 mg total) by mouth daily.  90 tablet  3  . BYETTA 10 MCG PEN 10 MCG/0.04ML SOLN INJECT UNDER THE SKIN TWICE DAILY  2.4 mL  1  . clindamycin (CLEOCIN) 150 MG capsule Take by mouth. 4 tabs prior to dental work       . econazole nitrate 1 % cream Apply topically 2 (two) times daily. To affected area  30 g  2  . glucose blood (ONE TOUCH ULTRA TEST) test strip Test twice daily.  100 each  11  . Insulin Pen Needle (B-D UF III MINI PEN NEEDLES) 31G X 5 MM MISC Use as directed  300 each  3  . Lancets (ONETOUCH ULTRASOFT) lancets USE 1 DAILY  100 each  2  . metFORMIN (GLUCOPHAGE-XR) 500 MG 24 hr tablet TAKE 2 TABLETS TWICE A DAY WITH A MEAL  360 tablet  5  . minocycline (MINOCIN,DYNACIN) 100 MG capsule Take 100 mg by mouth. Take as needed       . omeprazole (PRILOSEC) 20 MG capsule TAKE 1 CAPSULE BY MOUTH EVERY DAY  90 capsule  1  . ZETIA 10 MG tablet TAKE 1 TABLET DAILY  90 tablet  1  . [DISCONTINUED] glucose blood (ONE TOUCH ULTRA TEST) test strip Test once daily.  100 each  11  . cholecalciferol (VITAMIN D) 1000 UNIT tablet Take 5,000 Units by mouth daily.       . fish oil-omega-3 fatty acids 1000 MG capsule Take 2 g by mouth daily.       . MULTIPLE VITAMINS PO Take by mouth.        . [DISCONTINUED] omeprazole (PRILOSEC) 20 MG capsule Take 1 capsule (20 mg total) by mouth daily.  90 capsule  4   No facility-administered encounter medications on file as of 06/20/2012.    EXAM:  BP 136/70  Pulse 59  Temp(Src) 98.3 F (36.8 C) (Oral)  Ht 5' 5.5" (1.664 m)  Wt 193 lb (87.544 kg)  BMI 31.62 kg/m2  SpO2 97%  Body mass index is 31.62 kg/(m^2).  Physical Exam: Vital signs reviewed ZOX:WRUE is a well-developed well-nourished alert cooperative   female who appears her stated age in no acute distress.  HEENT: normocephalic atraumatic , Eyes: PERRL EOM's full,  conjunctiva clear, Nares: paten,t no deformity discharge or tenderness., Ears: no deformity EAC's clear TMs with normal landmarks. Mouth: clear OP, no lesions, edema.  Moist mucous membranes. Small hemangioma type papule on the tip of the time she states been there a while Dentition in adequate repair. NECK: supple without masses, thyromegaly no JVD CHEST/PULM:  Clear to auscultation and percussion breath sounds equal no wheeze , rales or rhonchi. No chest wall deformities or tenderness. Breast: normal by inspection . No dimpling, discharge, masses, tenderness or discharge .  CV: PMI is nondisplaced, S1 S2 no gallops, murmurs, rubs. Peripheral pulses are present without delay.No JVD .  ABDOMEN: Bowel sounds normal nontender  No guard or rebound, no hepato splenomegal no CVA tenderness.  No hernia. Extremtities:  No clubbing cyanosis or edema, no acute joint swelling or redness no focal atrophy pulses are palpable NEURO:  Oriented x3, cranial nerves 3-12 appear to be intact, no obvious focal weakness,gait within normal limits no abnormal reflexes or asymmetrical SKIN: No acute rashes normal turgor, color, no bruising or petechiae. PSYCH: Oriented, good eye contact, no obvious depression anxiety, cognition and judgment appear normal. LN: no cervical axillary inguinal adenopathy Diabetes foot exam done Lab Results  Component Value Date   WBC 5.3 06/20/2012   HGB 13.5 06/20/2012   HCT 38.8 06/20/2012   PLT 193.0 06/20/2012   GLUCOSE 124* 06/20/2012   CHOL 146 06/20/2012   TRIG 66.0 06/20/2012   HDL 45.00 06/20/2012   LDLCALC 88 06/20/2012   ALT 25 06/20/2012   AST 20 06/20/2012   NA 137 06/20/2012   K 4.4 06/20/2012   CL 101 06/20/2012   CREATININE 0.7 06/20/2012   BUN 11 06/20/2012   CO2 28 06/20/2012   TSH 2.31 06/20/2012   HGBA1C 6.9* 06/20/2012   MICROALBUR 0.6 06/20/2012   Wt Readings from Last 3 Encounters:  06/20/12 193 lb (87.544 kg)  01/17/12 197 lb (89.359 kg)  11/30/11 195 lb (88.451 kg)     ASSESSMENT AND PLAN:  Discussed the following assessment and plan:  Visit for preventive health examination - reviewed hcp declined zostavax at this time.  - Plan: HM MAMMOGRAPHY, glucose blood (ONE TOUCH ULTRA TEST) test strip, Basic metabolic panel, CBC with Differential, Hemoglobin A1c, Hepatic function panel, Lipid panel, TSH, Microalbumin / creatinine urine ratio, Hepatitis C antibody  DIABETES MELLITUS, TYPE II - Plan: HM MAMMOGRAPHY, glucose blood (ONE TOUCH ULTRA TEST) test strip, Basic metabolic panel, CBC with Differential, Hemoglobin A1c, Hepatic function panel, Lipid panel, TSH, Microalbumin / creatinine urine ratio, Hepatitis C antibody  HYPERLIPIDEMIA-MIXED - Plan: HM MAMMOGRAPHY, glucose blood (ONE TOUCH ULTRA TEST) test strip, Basic metabolic panel, CBC with Differential, Hemoglobin A1c, Hepatic function panel, Lipid panel, TSH, Microalbumin / creatinine urine ratio, Hepatitis C antibody  CAD, NATIVE VESSEL - Plan: HM MAMMOGRAPHY, glucose blood (ONE TOUCH ULTRA TEST) test strip, Basic metabolic panel, CBC with Differential, Hemoglobin A1c, Hepatic function panel, Lipid panel, TSH, Microalbumin / creatinine urine ratio, Hepatitis C antibody Ok to incr  bg monitoring to Strip    Increase to  Bid  .   Ok to refill meds in the interim while transitioning to  Another cardiologist  Patient Care Team: Madelin Headings, MD as PCP - General (Internal Medicine) Gaylord Shih, MD (Cardiology) Hart Carwin, MD (Gastroenterology) Amy Y [place], MD as Consulting Physician (Dermatology) Jeani Hawking, MD as Attending Physician (Obstetrics and Gynecology) Valeria Batman, MD (Orthopedic Surgery) Patient Instructions  Continue lifestyle intervention healthy eating and exercise . I agree checking  bs twice a days some days.  Will notify you  of labs when available. Consider shingles vaccine  At some point.  Get your eye exam and mammogram when due.  Preventive Care for Adults,  Female A healthy lifestyle and preventive care can promote health and wellness. Preventive health guidelines for women include the following key practices.  A routine yearly physical is a good way to check with your caregiver about your health and preventive screening. It is a chance to share any concerns and updates on your health, and to receive a thorough exam.  Visit your dentist for a routine exam and preventive care every 6 months. Brush your teeth twice a day and floss once a day. Good oral hygiene prevents tooth decay and gum disease.  The frequency of eye exams is based on your age, health, family medical history, use of contact lenses, and other factors. Follow your caregiver's recommendations for frequency of eye exams.  Eat a healthy diet. Foods like vegetables, fruits, whole grains, low-fat dairy products, and lean protein foods contain the nutrients you need without too many calories. Decrease your intake of foods high in solid fats, added sugars, and salt. Eat the right amount of calories for you.Get information about a proper diet from your caregiver, if necessary.  Regular physical exercise is one of the most important things you can do for your health. Most adults should get at least 150 minutes of moderate-intensity exercise (any activity that increases your heart rate and causes you to sweat) each week. In addition, most adults need muscle-strengthening exercises on 2 or more days a week.  Maintain a healthy weight. The body mass index (BMI) is a screening tool to identify possible weight problems. It provides an estimate of body fat based on height and weight. Your caregiver can help determine your BMI, and can help you achieve or maintain a healthy weight.For adults 20 years and older:  A BMI below 18.5 is considered underweight.  A BMI of 18.5 to 24.9 is normal.  A BMI of 25 to 29.9 is considered overweight.  A BMI of 30 and above is considered obese.  Maintain normal  blood lipids and cholesterol levels by exercising and minimizing your intake of saturated fat. Eat a balanced diet with plenty of fruit and vegetables. Blood tests for lipids and cholesterol should begin at age 30 and be repeated every 5 years. If your lipid or cholesterol levels are high, you are over 50, or you are at high risk for heart disease, you may need your cholesterol levels checked more frequently.Ongoing high lipid and cholesterol levels should be treated with medicines if diet and exercise are not effective.  If you smoke, find out from your caregiver how to quit. If you do not use tobacco, do not start.  If you are pregnant, do not drink alcohol. If you are breastfeeding, be very cautious about drinking alcohol. If you are not pregnant and choose to drink alcohol, do not exceed 1 drink per day. One drink is considered to be 12 ounces (355 mL) of beer, 5 ounces (148 mL) of wine, or 1.5 ounces (44  mL) of liquor.  Avoid use of street drugs. Do not share needles with anyone. Ask for help if you need support or instructions about stopping the use of drugs.  High blood pressure causes heart disease and increases the risk of stroke. Your blood pressure should be checked at least every 1 to 2 years. Ongoing high blood pressure should be treated with medicines if weight loss and exercise are not effective.  If you are 62 to 61 years old, ask your caregiver if you should take aspirin to prevent strokes.  Diabetes screening involves taking a blood sample to check your fasting blood sugar level. This should be done once every 3 years, after age 38, if you are within normal weight and without risk factors for diabetes. Testing should be considered at a younger age or be carried out more frequently if you are overweight and have at least 1 risk factor for diabetes.  Breast cancer screening is essential preventive care for women. You should practice "breast self-awareness." This means understanding the  normal appearance and feel of your breasts and may include breast self-examination. Any changes detected, no matter how small, should be reported to a caregiver. Women in their 41s and 30s should have a clinical breast exam (CBE) by a caregiver as part of a regular health exam every 1 to 3 years. After age 84, women should have a CBE every year. Starting at age 66, women should consider having a mammography (breast X-ray test) every year. Women who have a family history of breast cancer should talk to their caregiver about genetic screening. Women at a high risk of breast cancer should talk to their caregivers about having magnetic resonance imaging (MRI) and a mammography every year.  The Pap test is a screening test for cervical cancer. A Pap test can show cell changes on the cervix that might become cervical cancer if left untreated. A Pap test is a procedure in which cells are obtained and examined from the lower end of the uterus (cervix).  Women should have a Pap test starting at age 64.  Between ages 48 and 28, Pap tests should be repeated every 2 years.  Beginning at age 62, you should have a Pap test every 3 years as long as the past 3 Pap tests have been normal.  Some women have medical problems that increase the chance of getting cervical cancer. Talk to your caregiver about these problems. It is especially important to talk to your caregiver if a new problem develops soon after your last Pap test. In these cases, your caregiver may recommend more frequent screening and Pap tests.  The above recommendations are the same for women who have or have not gotten the vaccine for human papillomavirus (HPV).  If you had a hysterectomy for a problem that was not cancer or a condition that could lead to cancer, then you no longer need Pap tests. Even if you no longer need a Pap test, a regular exam is a good idea to make sure no other problems are starting.  If you are between ages 54 and 51, and  you have had normal Pap tests going back 10 years, you no longer need Pap tests. Even if you no longer need a Pap test, a regular exam is a good idea to make sure no other problems are starting.  If you have had past treatment for cervical cancer or a condition that could lead to cancer, you need Pap tests and screening for  cancer for at least 20 years after your treatment.  If Pap tests have been discontinued, risk factors (such as a new sexual partner) need to be reassessed to determine if screening should be resumed.  The HPV test is an additional test that may be used for cervical cancer screening. The HPV test looks for the virus that can cause the cell changes on the cervix. The cells collected during the Pap test can be tested for HPV. The HPV test could be used to screen women aged 2 years and older, and should be used in women of any age who have unclear Pap test results. After the age of 81, women should have HPV testing at the same frequency as a Pap test.  Colorectal cancer can be detected and often prevented. Most routine colorectal cancer screening begins at the age of 41 and continues through age 99. However, your caregiver may recommend screening at an earlier age if you have risk factors for colon cancer. On a yearly basis, your caregiver may provide home test kits to check for hidden blood in the stool. Use of a small camera at the end of a tube, to directly examine the colon (sigmoidoscopy or colonoscopy), can detect the earliest forms of colorectal cancer. Talk to your caregiver about this at age 57, when routine screening begins. Direct examination of the colon should be repeated every 5 to 10 years through age 60, unless early forms of pre-cancerous polyps or small growths are found.  Hepatitis C blood testing is recommended for all people born from [birth year] through 1965 and any individual with known risks for hepatitis C.  Practice safe sex. Use condoms and avoid high-risk sexual  practices to reduce the spread of sexually transmitted infections (STIs). STIs include gonorrhea, chlamydia, syphilis, trichomonas, herpes, HPV, and human immunodeficiency virus (HIV). Herpes, HIV, and HPV are viral illnesses that have no cure. They can result in disability, cancer, and death. Sexually active women aged 16 and younger should be checked for chlamydia. Older women with new or multiple partners should also be tested for chlamydia. Testing for other STIs is recommended if you are sexually active and at increased risk.  Osteoporosis is a disease in which the bones lose minerals and strength with aging. This can result in serious bone fractures. The risk of osteoporosis can be identified using a bone density scan. Women ages 4 and over and women at risk for fractures or osteoporosis should discuss screening with their caregivers. Ask your caregiver whether you should take a calcium supplement or vitamin D to reduce the rate of osteoporosis.  Menopause can be associated with physical symptoms and risks. Hormone replacement therapy is available to decrease symptoms and risks. You should talk to your caregiver about whether hormone replacement therapy is right for you.  Use sunscreen with sun protection factor (SPF) of 30 or more. Apply sunscreen liberally and repeatedly throughout the day. You should seek shade when your shadow is shorter than you. Protect yourself by wearing long sleeves, pants, a wide-brimmed hat, and sunglasses year round, whenever you are outdoors.  Once a month, do a whole body skin exam, using a mirror to look at the skin on your back. Notify your caregiver of new moles, moles that have irregular borders, moles that are larger than a pencil eraser, or moles that have changed in shape or color.  Stay current with required immunizations.  Influenza. You need a dose every fall (or winter). The composition of the flu vaccine changes  each year, so being vaccinated once is not  enough.  Pneumococcal polysaccharide. You need 1 to 2 doses if you smoke cigarettes or if you have certain chronic medical conditions. You need 1 dose at age 52 (or older) if you have never been vaccinated.  Tetanus, diphtheria, pertussis (Tdap, Td). Get 1 dose of Tdap vaccine if you are younger than age 7, are over 46 and have contact with an infant, are a Research scientist (physical sciences), are pregnant, or simply want to be protected from whooping cough. After that, you need a Td booster dose every 10 years. Consult your caregiver if you have not had at least 3 tetanus and diphtheria-containing shots sometime in your life or have a deep or dirty wound.  HPV. You need this vaccine if you are a woman age 54 or younger. The vaccine is given in 3 doses over 6 months.  Measles, mumps, rubella (MMR). You need at least 1 dose of MMR if you were born in 1957 or later. You may also need a second dose.  Meningococcal. If you are age 54 to 91 and a first-year college student living in a residence hall, or have one of several medical conditions, you need to get vaccinated against meningococcal disease. You may also need additional booster doses.  Zoster (shingles). If you are age 60 or older, you should get this vaccine.  Varicella (chickenpox). If you have never had chickenpox or you were vaccinated but received only 1 dose, talk to your caregiver to find out if you need this vaccine.  Hepatitis A. You need this vaccine if you have a specific risk factor for hepatitis A virus infection or you simply wish to be protected from this disease. The vaccine is usually given as 2 doses, 6 to 18 months apart.  Hepatitis B. You need this vaccine if you have a specific risk factor for hepatitis B virus infection or you simply wish to be protected from this disease. The vaccine is given in 3 doses, usually over 6 months. Preventive Services / Frequency Ages 7 to 45  Blood pressure check.** / Every 1 to 2 years.  Lipid and  cholesterol check.** / Every 5 years beginning at age 32.  Clinical breast exam.** / Every 3 years for women in their 3s and 30s.  Pap test.** / Every 2 years from ages 30 through 79. Every 3 years starting at age 72 through age 11 or 60 with a history of 3 consecutive normal Pap tests.  HPV screening.** / Every 3 years from ages 62 through ages 60 to 88 with a history of 3 consecutive normal Pap tests.  Hepatitis C blood test.** / For any individual with known risks for hepatitis C.  Skin self-exam. / Monthly.  Influenza immunization.** / Every year.  Pneumococcal polysaccharide immunization.** / 1 to 2 doses if you smoke cigarettes or if you have certain chronic medical conditions.  Tetanus, diphtheria, pertussis (Tdap, Td) immunization. / A one-time dose of Tdap vaccine. After that, you need a Td booster dose every 10 years.  HPV immunization. / 3 doses over 6 months, if you are 87 and younger.  Measles, mumps, rubella (MMR) immunization. / You need at least 1 dose of MMR if you were born in 1957 or later. You may also need a second dose.  Meningococcal immunization. / 1 dose if you are age 48 to 31 and a first-year college student living in a residence hall, or have one of several medical conditions, you need  to get vaccinated against meningococcal disease. You may also need additional booster doses.  Varicella immunization.** / Consult your caregiver.  Hepatitis A immunization.** / Consult your caregiver. 2 doses, 6 to 18 months apart.  Hepatitis B immunization.** / Consult your caregiver. 3 doses usually over 6 months. Ages 59 to 66  Blood pressure check.** / Every 1 to 2 years.  Lipid and cholesterol check.** / Every 5 years beginning at age 40.  Clinical breast exam.** / Every year after age 62.  Mammogram.** / Every year beginning at age 53 and continuing for as long as you are in good health. Consult with your caregiver.  Pap test.** / Every 3 years starting at age  30 through age 51 or 20 with a history of 3 consecutive normal Pap tests.  HPV screening.** / Every 3 years from ages 54 through ages 35 to 58 with a history of 3 consecutive normal Pap tests.  Fecal occult blood test (FOBT) of stool. / Every year beginning at age 40 and continuing until age 18. You may not need to do this test if you get a colonoscopy every 10 years.  Flexible sigmoidoscopy or colonoscopy.** / Every 5 years for a flexible sigmoidoscopy or every 10 years for a colonoscopy beginning at age 51 and continuing until age 10.  Hepatitis C blood test.** / For all people born from [birth year] through 1965 and any individual with known risks for hepatitis C.  Skin self-exam. / Monthly.  Influenza immunization.** / Every year.  Pneumococcal polysaccharide immunization.** / 1 to 2 doses if you smoke cigarettes or if you have certain chronic medical conditions.  Tetanus, diphtheria, pertussis (Tdap, Td) immunization.** / A one-time dose of Tdap vaccine. After that, you need a Td booster dose every 10 years.  Measles, mumps, rubella (MMR) immunization. / You need at least 1 dose of MMR if you were born in 1957 or later. You may also need a second dose.  Varicella immunization.** / Consult your caregiver.  Meningococcal immunization.** / Consult your caregiver.  Hepatitis A immunization.** / Consult your caregiver. 2 doses, 6 to 18 months apart.  Hepatitis B immunization.** / Consult your caregiver. 3 doses, usually over 6 months. Ages 75 and over  Blood pressure check.** / Every 1 to 2 years.  Lipid and cholesterol check.** / Every 5 years beginning at age 42.  Clinical breast exam.** / Every year after age 11.  Mammogram.** / Every year beginning at age 48 and continuing for as long as you are in good health. Consult with your caregiver.  Pap test.** / Every 3 years starting at age 28 through age 29 or 78 with a 3 consecutive normal Pap tests. Testing can be stopped between 65 and  70 with 3 consecutive normal Pap tests and no abnormal Pap or HPV tests in the past 10 years.  HPV screening.** / Every 3 years from ages 15 through ages 47 or 89 with a history of 3 consecutive normal Pap tests. Testing can be stopped between 65 and 70 with 3 consecutive normal Pap tests and no abnormal Pap or HPV tests in the past 10 years.  Fecal occult blood test (FOBT) of stool. / Every year beginning at age 54 and continuing until age 5. You may not need to do this test if you get a colonoscopy every 10 years.  Flexible sigmoidoscopy or colonoscopy.** / Every 5 years for a flexible sigmoidoscopy or every 10 years for a colonoscopy beginning at age 70 and  continuing until age 67.  Hepatitis C blood test.** / For all people born from [birth year] through 1964 and any individual with known risks for hepatitis C.  Osteoporosis screening.** / A one-time screening for women ages 32 and over and women at risk for fractures or osteoporosis.  Skin self-exam. / Monthly.  Influenza immunization.** / Every year.  Pneumococcal polysaccharide immunization.** / 1 dose at age 24 (or older) if you have never been vaccinated.  Tetanus, diphtheria, pertussis (Tdap, Td) immunization. / A one-time dose of Tdap vaccine if you are over 65 and have contact with an infant, are a Research scientist (physical sciences), or simply want to be protected from whooping cough. After that, you need a Td booster dose every 10 years.  Varicella immunization.** / Consult your caregiver.  Meningococcal immunization.** / Consult your caregiver.  Hepatitis A immunization.** / Consult your caregiver. 2 doses, 6 to 18 months apart.  Hepatitis B immunization.** / Check with your caregiver. 3 doses, usually over 6 months. ** Family history and personal history of risk and conditions may change your caregiver's recommendations. Document Released: 03/20/2001 Document Revised: 04/16/2011 Document Reviewed: 06/19/2010 [facility] Patient Information 2013  [place], [place].     Neta Mends. Panosh M.D.

## 2012-06-20 NOTE — Patient Instructions (Signed)
Continue lifestyle intervention healthy eating and exercise . I agree checking  bs twice a days some days.  Will notify you  of labs when available. Consider shingles vaccine  At some point.  Get your eye exam and mammogram when due.  Preventive Care for Adults, Female A healthy lifestyle and preventive care can promote health and wellness. Preventive health guidelines for women include the following key practices.  A routine yearly physical is a good way to check with your caregiver about your health and preventive screening. It is a chance to share any concerns and updates on your health, and to receive a thorough exam.  Visit your dentist for a routine exam and preventive care every 6 months. Brush your teeth twice a day and floss once a day. Good oral hygiene prevents tooth decay and gum disease.  The frequency of eye exams is based on your age, health, family medical history, use of contact lenses, and other factors. Follow your caregiver's recommendations for frequency of eye exams.  Eat a healthy diet. Foods like vegetables, fruits, whole grains, low-fat dairy products, and lean protein foods contain the nutrients you need without too many calories. Decrease your intake of foods high in solid fats, added sugars, and salt. Eat the right amount of calories for you.Get information about a proper diet from your caregiver, if necessary.  Regular physical exercise is one of the most important things you can do for your health. Most adults should get at least 150 minutes of moderate-intensity exercise (any activity that increases your heart rate and causes you to sweat) each week. In addition, most adults need muscle-strengthening exercises on 2 or more days a week.  Maintain a healthy weight. The body mass index (BMI) is a screening tool to identify possible weight problems. It provides an estimate of body fat based on height and weight. Your caregiver can help determine your BMI, and can help  you achieve or maintain a healthy weight.For adults 20 years and older:  A BMI below 18.5 is considered underweight.  A BMI of 18.5 to 24.9 is normal.  A BMI of 25 to 29.9 is considered overweight.  A BMI of 30 and above is considered obese.  Maintain normal blood lipids and cholesterol levels by exercising and minimizing your intake of saturated fat. Eat a balanced diet with plenty of fruit and vegetables. Blood tests for lipids and cholesterol should begin at age 89 and be repeated every 5 years. If your lipid or cholesterol levels are high, you are over 50, or you are at high risk for heart disease, you may need your cholesterol levels checked more frequently.Ongoing high lipid and cholesterol levels should be treated with medicines if diet and exercise are not effective.  If you smoke, find out from your caregiver how to quit. If you do not use tobacco, do not start.  If you are pregnant, do not drink alcohol. If you are breastfeeding, be very cautious about drinking alcohol. If you are not pregnant and choose to drink alcohol, do not exceed 1 drink per day. One drink is considered to be 12 ounces (355 mL) of beer, 5 ounces (148 mL) of wine, or 1.5 ounces (44 mL) of liquor.  Avoid use of street drugs. Do not share needles with anyone. Ask for help if you need support or instructions about stopping the use of drugs.  High blood pressure causes heart disease and increases the risk of stroke. Your blood pressure should be checked at least  every 1 to 2 years. Ongoing high blood pressure should be treated with medicines if weight loss and exercise are not effective.  If you are 17 to 61 years old, ask your caregiver if you should take aspirin to prevent strokes.  Diabetes screening involves taking a blood sample to check your fasting blood sugar level. This should be done once every 3 years, after age 42, if you are within normal weight and without risk factors for diabetes. Testing should be  considered at a younger age or be carried out more frequently if you are overweight and have at least 1 risk factor for diabetes.  Breast cancer screening is essential preventive care for women. You should practice "breast self-awareness." This means understanding the normal appearance and feel of your breasts and may include breast self-examination. Any changes detected, no matter how small, should be reported to a caregiver. Women in their 29s and 30s should have a clinical breast exam (CBE) by a caregiver as part of a regular health exam every 1 to 3 years. After age 44, women should have a CBE every year. Starting at age 70, women should consider having a mammography (breast X-ray test) every year. Women who have a family history of breast cancer should talk to their caregiver about genetic screening. Women at a high risk of breast cancer should talk to their caregivers about having magnetic resonance imaging (MRI) and a mammography every year.  The Pap test is a screening test for cervical cancer. A Pap test can show cell changes on the cervix that might become cervical cancer if left untreated. A Pap test is a procedure in which cells are obtained and examined from the lower end of the uterus (cervix).  Women should have a Pap test starting at age 95.  Between ages 25 and 73, Pap tests should be repeated every 2 years.  Beginning at age 52, you should have a Pap test every 3 years as long as the past 3 Pap tests have been normal.  Some women have medical problems that increase the chance of getting cervical cancer. Talk to your caregiver about these problems. It is especially important to talk to your caregiver if a new problem develops soon after your last Pap test. In these cases, your caregiver may recommend more frequent screening and Pap tests.  The above recommendations are the same for women who have or have not gotten the vaccine for human papillomavirus (HPV).  If you had a  hysterectomy for a problem that was not cancer or a condition that could lead to cancer, then you no longer need Pap tests. Even if you no longer need a Pap test, a regular exam is a good idea to make sure no other problems are starting.  If you are between ages 35 and 59, and you have had normal Pap tests going back 10 years, you no longer need Pap tests. Even if you no longer need a Pap test, a regular exam is a good idea to make sure no other problems are starting.  If you have had past treatment for cervical cancer or a condition that could lead to cancer, you need Pap tests and screening for cancer for at least 20 years after your treatment.  If Pap tests have been discontinued, risk factors (such as a new sexual partner) need to be reassessed to determine if screening should be resumed.  The HPV test is an additional test that may be used for cervical cancer screening.  The HPV test looks for the virus that can cause the cell changes on the cervix. The cells collected during the Pap test can be tested for HPV. The HPV test could be used to screen women aged 23 years and older, and should be used in women of any age who have unclear Pap test results. After the age of 7, women should have HPV testing at the same frequency as a Pap test.  Colorectal cancer can be detected and often prevented. Most routine colorectal cancer screening begins at the age of 75 and continues through age 77. However, your caregiver may recommend screening at an earlier age if you have risk factors for colon cancer. On a yearly basis, your caregiver may provide home test kits to check for hidden blood in the stool. Use of a small camera at the end of a tube, to directly examine the colon (sigmoidoscopy or colonoscopy), can detect the earliest forms of colorectal cancer. Talk to your caregiver about this at age 30, when routine screening begins. Direct examination of the colon should be repeated every 5 to 10 years through age  64, unless early forms of pre-cancerous polyps or small growths are found.  Hepatitis C blood testing is recommended for all people born from [birth year] through 1965 and any individual with known risks for hepatitis C.  Practice safe sex. Use condoms and avoid high-risk sexual practices to reduce the spread of sexually transmitted infections (STIs). STIs include gonorrhea, chlamydia, syphilis, trichomonas, herpes, HPV, and human immunodeficiency virus (HIV). Herpes, HIV, and HPV are viral illnesses that have no cure. They can result in disability, cancer, and death. Sexually active women aged 31 and younger should be checked for chlamydia. Older women with new or multiple partners should also be tested for chlamydia. Testing for other STIs is recommended if you are sexually active and at increased risk.  Osteoporosis is a disease in which the bones lose minerals and strength with aging. This can result in serious bone fractures. The risk of osteoporosis can be identified using a bone density scan. Women ages 19 and over and women at risk for fractures or osteoporosis should discuss screening with their caregivers. Ask your caregiver whether you should take a calcium supplement or vitamin D to reduce the rate of osteoporosis.  Menopause can be associated with physical symptoms and risks. Hormone replacement therapy is available to decrease symptoms and risks. You should talk to your caregiver about whether hormone replacement therapy is right for you.  Use sunscreen with sun protection factor (SPF) of 30 or more. Apply sunscreen liberally and repeatedly throughout the day. You should seek shade when your shadow is shorter than you. Protect yourself by wearing long sleeves, pants, a wide-brimmed hat, and sunglasses year round, whenever you are outdoors.  Once a month, do a whole body skin exam, using a mirror to look at the skin on your back. Notify your caregiver of new moles, moles that have irregular borders,  moles that are larger than a pencil eraser, or moles that have changed in shape or color.  Stay current with required immunizations.  Influenza. You need a dose every fall (or winter). The composition of the flu vaccine changes each year, so being vaccinated once is not enough.  Pneumococcal polysaccharide. You need 1 to 2 doses if you smoke cigarettes or if you have certain chronic medical conditions. You need 1 dose at age 2 (or older) if you have never been vaccinated.  Tetanus, diphtheria, pertussis (Tdap,  Td). Get 1 dose of Tdap vaccine if you are younger than age 9, are over 38 and have contact with an infant, are a Research scientist (physical sciences), are pregnant, or simply want to be protected from whooping cough. After that, you need a Td booster dose every 10 years. Consult your caregiver if you have not had at least 3 tetanus and diphtheria-containing shots sometime in your life or have a deep or dirty wound.  HPV. You need this vaccine if you are a woman age 64 or younger. The vaccine is given in 3 doses over 6 months.  Measles, mumps, rubella (MMR). You need at least 1 dose of MMR if you were born in 1957 or later. You may also need a second dose.  Meningococcal. If you are age 42 to 54 and a first-year college student living in a residence hall, or have one of several medical conditions, you need to get vaccinated against meningococcal disease. You may also need additional booster doses.  Zoster (shingles). If you are age 30 or older, you should get this vaccine.  Varicella (chickenpox). If you have never had chickenpox or you were vaccinated but received only 1 dose, talk to your caregiver to find out if you need this vaccine.  Hepatitis A. You need this vaccine if you have a specific risk factor for hepatitis A virus infection or you simply wish to be protected from this disease. The vaccine is usually given as 2 doses, 6 to 18 months apart.  Hepatitis B. You need this vaccine if you have a  specific risk factor for hepatitis B virus infection or you simply wish to be protected from this disease. The vaccine is given in 3 doses, usually over 6 months. Preventive Services / Frequency Ages 27 to 40  Blood pressure check.** / Every 1 to 2 years.  Lipid and cholesterol check.** / Every 5 years beginning at age 38.  Clinical breast exam.** / Every 3 years for women in their 23s and 30s.  Pap test.** / Every 2 years from ages 59 through 71. Every 3 years starting at age 54 through age 25 or 43 with a history of 3 consecutive normal Pap tests.  HPV screening.** / Every 3 years from ages 29 through ages 81 to 43 with a history of 3 consecutive normal Pap tests.  Hepatitis C blood test.** / For any individual with known risks for hepatitis C.  Skin self-exam. / Monthly.  Influenza immunization.** / Every year.  Pneumococcal polysaccharide immunization.** / 1 to 2 doses if you smoke cigarettes or if you have certain chronic medical conditions.  Tetanus, diphtheria, pertussis (Tdap, Td) immunization. / A one-time dose of Tdap vaccine. After that, you need a Td booster dose every 10 years.  HPV immunization. / 3 doses over 6 months, if you are 63 and younger.  Measles, mumps, rubella (MMR) immunization. / You need at least 1 dose of MMR if you were born in 1957 or later. You may also need a second dose.  Meningococcal immunization. / 1 dose if you are age 68 to 38 and a first-year college student living in a residence hall, or have one of several medical conditions, you need to get vaccinated against meningococcal disease. You may also need additional booster doses.  Varicella immunization.** / Consult your caregiver.  Hepatitis A immunization.** / Consult your caregiver. 2 doses, 6 to 18 months apart.  Hepatitis B immunization.** / Consult your caregiver. 3 doses usually over 6 months. Ages 79  to 64  Blood pressure check.** / Every 1 to 2 years.  Lipid and cholesterol  check.** / Every 5 years beginning at age 75.  Clinical breast exam.** / Every year after age 10.  Mammogram.** / Every year beginning at age 4 and continuing for as long as you are in good health. Consult with your caregiver.  Pap test.** / Every 3 years starting at age 31 through age 64 or 70 with a history of 3 consecutive normal Pap tests.  HPV screening.** / Every 3 years from ages 48 through ages 91 to 67 with a history of 3 consecutive normal Pap tests.  Fecal occult blood test (FOBT) of stool. / Every year beginning at age 97 and continuing until age 69. You may not need to do this test if you get a colonoscopy every 10 years.  Flexible sigmoidoscopy or colonoscopy.** / Every 5 years for a flexible sigmoidoscopy or every 10 years for a colonoscopy beginning at age 6 and continuing until age 10.  Hepatitis C blood test.** / For all people born from [birth year] through 1964 and any individual with known risks for hepatitis C.  Skin self-exam. / Monthly.  Influenza immunization.** / Every year.  Pneumococcal polysaccharide immunization.** / 1 to 2 doses if you smoke cigarettes or if you have certain chronic medical conditions.  Tetanus, diphtheria, pertussis (Tdap, Td) immunization.** / A one-time dose of Tdap vaccine. After that, you need a Td booster dose every 10 years.  Measles, mumps, rubella (MMR) immunization. / You need at least 1 dose of MMR if you were born in 1957 or later. You may also need a second dose.  Varicella immunization.** / Consult your caregiver.  Meningococcal immunization.** / Consult your caregiver.  Hepatitis A immunization.** / Consult your caregiver. 2 doses, 6 to 18 months apart.  Hepatitis B immunization.** / Consult your caregiver. 3 doses, usually over 6 months. Ages 69 and over  Blood pressure check.** / Every 1 to 2 years.  Lipid and cholesterol check.** / Every 5 years beginning at age 66.  Clinical breast exam.** / Every year after age  60.  Mammogram.** / Every year beginning at age 88 and continuing for as long as you are in good health. Consult with your caregiver.  Pap test.** / Every 3 years starting at age 45 through age 17 or 35 with a 3 consecutive normal Pap tests. Testing can be stopped between 65 and 70 with 3 consecutive normal Pap tests and no abnormal Pap or HPV tests in the past 10 years.  HPV screening.** / Every 3 years from ages 28 through ages 46 or 38 with a history of 3 consecutive normal Pap tests. Testing can be stopped between 65 and 70 with 3 consecutive normal Pap tests and no abnormal Pap or HPV tests in the past 10 years.  Fecal occult blood test (FOBT) of stool. / Every year beginning at age 83 and continuing until age 40. You may not need to do this test if you get a colonoscopy every 10 years.  Flexible sigmoidoscopy or colonoscopy.** / Every 5 years for a flexible sigmoidoscopy or every 10 years for a colonoscopy beginning at age 39 and continuing until age 73.  Hepatitis C blood test.** / For all people born from [birth year] through 1964 and any individual with known risks for hepatitis C.  Osteoporosis screening.** / A one-time screening for women ages 49 and over and women at risk for fractures or osteoporosis.  Skin self-exam. /  Monthly.  Influenza immunization.** / Every year.  Pneumococcal polysaccharide immunization.** / 1 dose at age 38 (or older) if you have never been vaccinated.  Tetanus, diphtheria, pertussis (Tdap, Td) immunization. / A one-time dose of Tdap vaccine if you are over 65 and have contact with an infant, are a Research scientist (physical sciences), or simply want to be protected from whooping cough. After that, you need a Td booster dose every 10 years.  Varicella immunization.** / Consult your caregiver.  Meningococcal immunization.** / Consult your caregiver.  Hepatitis A immunization.** / Consult your caregiver. 2 doses, 6 to 18 months apart.  Hepatitis B immunization.** / Check with  your caregiver. 3 doses, usually over 6 months. ** Family history and personal history of risk and conditions may change your caregiver's recommendations. Document Released: 03/20/2001 Document Revised: 04/16/2011 Document Reviewed: 06/19/2010 [facility] Patient Information 2013 [place], [place].

## 2012-06-21 LAB — HEPATITIS C ANTIBODY: HCV Ab: NEGATIVE

## 2012-06-27 NOTE — Progress Notes (Signed)
Quick Note:  Left a message for pt to return call. ______ 

## 2012-07-02 NOTE — Progress Notes (Signed)
Quick Note:  Left a message for return call. ______ 

## 2012-07-27 ENCOUNTER — Other Ambulatory Visit: Payer: Self-pay | Admitting: Internal Medicine

## 2012-07-31 ENCOUNTER — Telehealth: Payer: Self-pay | Admitting: Internal Medicine

## 2012-07-31 NOTE — Telephone Encounter (Signed)
Pharm requested refill for pt's BYETTA 10 MCG PEN 10 MCG/0.04ML  Pharm: [facility], [address]

## 2012-08-01 MED ORDER — EXENATIDE 10 MCG/0.04ML ~~LOC~~ SOPN: PEN_INJECTOR | SUBCUTANEOUS | Status: DC

## 2012-08-14 ENCOUNTER — Other Ambulatory Visit: Payer: Self-pay

## 2012-09-29 ENCOUNTER — Other Ambulatory Visit: Payer: Self-pay | Admitting: Internal Medicine

## 2012-11-03 ENCOUNTER — Other Ambulatory Visit: Payer: Self-pay | Admitting: Internal Medicine

## 2012-11-05 ENCOUNTER — Telehealth: Payer: Self-pay | Admitting: Internal Medicine

## 2012-11-06 MED ORDER — OMEPRAZOLE 20 MG PO CPDR: DELAYED_RELEASE_CAPSULE | ORAL | Status: DC

## 2012-11-06 NOTE — Addendum Note (Signed)
Addended by: Richardson Chiquito on: 11/06/2012 08:55 AM   Modules accepted: Orders

## 2012-11-06 NOTE — Telephone Encounter (Signed)
Patient actually does not need an appointment at this time. This was an error on my part. I will advise patient of this and go ahead and send several refills of omeprazole to her pharmacy.

## 2012-11-06 NOTE — Telephone Encounter (Signed)
rx sent. Patient must keep appointment for further refills.

## 2012-12-11 ENCOUNTER — Other Ambulatory Visit: Payer: Self-pay

## 2012-12-22 ENCOUNTER — Other Ambulatory Visit: Payer: Self-pay | Admitting: Internal Medicine

## 2012-12-30 ENCOUNTER — Ambulatory Visit: Payer: BC Managed Care – PPO | Admitting: Internal Medicine

## 2012-12-30 ENCOUNTER — Telehealth: Payer: Self-pay | Admitting: Internal Medicine

## 2012-12-30 NOTE — Telephone Encounter (Signed)
Pt needs fup on 12/22. Pt would like to go to [facility] for a a1c a week prior and then see dr Fabian Sharp on Mon 10/22. She is off work that day. 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 Is it ok to schedule? Those appts are on hold

## 2012-12-31 ENCOUNTER — Other Ambulatory Visit: Payer: Self-pay | Admitting: Family Medicine

## 2012-12-31 DIAGNOSIS — E119 Type 2 diabetes mellitus without complications: Secondary | ICD-10-CM

## 2012-12-31 NOTE — Telephone Encounter (Signed)
Ok to do this 

## 2012-12-31 NOTE — Telephone Encounter (Signed)
Done

## 2012-12-31 NOTE — Telephone Encounter (Signed)
appt made and can you put the a1c order in for [facility]?

## 2013-01-16 ENCOUNTER — Encounter: Payer: BC Managed Care – PPO | Admitting: Cardiology

## 2013-01-23 ENCOUNTER — Other Ambulatory Visit (INDEPENDENT_AMBULATORY_CARE_PROVIDER_SITE_OTHER): Payer: BC Managed Care – PPO

## 2013-01-23 DIAGNOSIS — E119 Type 2 diabetes mellitus without complications: Secondary | ICD-10-CM

## 2013-01-26 ENCOUNTER — Encounter: Payer: Self-pay | Admitting: Internal Medicine

## 2013-01-26 ENCOUNTER — Ambulatory Visit (INDEPENDENT_AMBULATORY_CARE_PROVIDER_SITE_OTHER): Payer: BC Managed Care – PPO | Admitting: Internal Medicine

## 2013-01-26 VITALS — BP 140/80 | HR 70 | Temp 99.0°F | Wt 196.0 lb

## 2013-01-26 DIAGNOSIS — E119 Type 2 diabetes mellitus without complications: Secondary | ICD-10-CM

## 2013-01-26 DIAGNOSIS — R030 Elevated blood-pressure reading, without diagnosis of hypertension: Secondary | ICD-10-CM

## 2013-01-26 MED ORDER — ONETOUCH ULTRASOFT LANCETS MISC: Status: DC

## 2013-01-26 NOTE — Progress Notes (Signed)
Chief Complaint  Patient presents with  . Follow-up    HPI: FU DM   multiple medical issues.  Since last visit has not exercised much could do better on the dietary front but is been a rough time as her husband passed away 6 months ago.   No low blood sugarsNo lows   Monitoring needs new lancets  VisionNeuro: No new reported symptoms.  Declines flu shot at this time like to wait on second pneumonia vaccine a possible.  Thinks that her blood pressures supple little bit because of stress and situational..   Has an appointment with her new cardiologist within the next month. ROS: See pertinent positives and negatives per HPI. No current chest pain shortness of breath new symptoms.  Past Medical History  Diagnosis Date  . DIABETES MELLITUS, TYPE II 12/09/2009  . HYPERLIPIDEMIA-MIXED 04/14/2008  . OVERWEIGHT/OBESITY 05/13/2009  . CAD, NATIVE VESSEL 05/13/2009  . Occult blood in stools   . Anemia     as a child   . Hx of abnormal cervical Pap smear     cryo  but normal since then   . Ulcer     Duodenal ulcer by x-ray 40 years ago  . Osteopenia   . Obesity   . Myocardial infarction     2003  . Gastritis   . Gastric ulcer   . Intestinal metaplasia of gastric mucosa 2012    Family History  Problem Relation Age of Onset  . Emphysema Mother     Smoker  . Heart attack Mother     due to head injury  . Alcohol abuse Mother   . Heart disease Mother   . Early death Mother   . Lung cancer Father   . Heart attack Father   . Hypertension Father   . Alcohol abuse Father   . Heart disease Father   . Aneurysm Father   . Alcohol abuse Brother   . Diabetes Brother   . Diabetes Brother   . Aneurysm Paternal Grandmother   . Diabetes      grandfather  . Colon cancer Neg Hx   . Esophageal cancer Neg Hx   . Stomach cancer Neg Hx   . Sudden death Brother     scd    History   Social History  . Marital Status: Married    Spouse Name: N/A    Number of Children: 2  . Years of  Education: phd    Occupational History  . 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 Smoking status: Former Smoker    Quit date: 02/05/1994  . Smokeless tobacco: Never Used  . Alcohol Use: No  . Drug Use: No  . Sexual Activity: None   Other Topics Concern  . 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    No firearms    NO reg exercise    Pets- Poodles 2    Sleep 7 hours     Outpatient Encounter Prescriptions as of 01/26/2013  Medication Sig  . aspirin 81 MG tablet Take 81 mg  by mouth daily.    Marland Kitchen atorvastatin (LIPITOR) 40 MG tablet Take 1 tablet (40 mg total) by mouth daily.  . B-D UF III MINI PEN NEEDLES 31G X 5 MM MISC USE AS DIRECTED  . BYETTA 10 MCG PEN 10 MCG/0.04ML SOPN injection INJECT UNDER THE SKIN TWICE DAILY  . clindamycin (CLEOCIN) 150 MG capsule Take by mouth. 4 tabs prior to dental work   . glucose blood (ONE TOUCH ULTRA TEST) test strip Test twice daily.  . Lancets (ONETOUCH ULTRASOFT) lancets USE 1 DAILY  . metFORMIN (GLUCOPHAGE-XR) 500 MG 24 hr tablet TAKE 2 TABLETS BY MOUTH TWICE DAILY WITH MEALS  . omeprazole (PRILOSEC) 20 MG capsule TAKE 1 CAPSULE BY MOUTH EVERY DAY  . ZETIA 10 MG tablet TAKE 1 TABLET BY MOUTH EVERY DAY  . [DISCONTINUED] Lancets (ONETOUCH ULTRASOFT) lancets USE 1 DAILY  . [DISCONTINUED] cholecalciferol (VITAMIN D) 1000 UNIT tablet Take 5,000 Units by mouth daily.   . [DISCONTINUED] econazole nitrate 1 % cream Apply topically 2 (two) times daily. To affected area  . [DISCONTINUED] fish oil-omega-3 fatty acids 1000 MG capsule Take 2 g by mouth daily.   . [DISCONTINUED] minocycline (MINOCIN,DYNACIN) 100 MG capsule Take 100 mg by mouth. Take as needed   . [DISCONTINUED] MULTIPLE  VITAMINS PO Take by mouth.      EXAM:  BP 140/80  Pulse 70  Temp(Src) 99 F (37.2 C) (Oral)  Wt 196 lb (88.905 kg)  SpO2 98%  Body mass index is 32.11 kg/(m^2).  GENERAL: vitals reviewed and listed above, alert, oriented, appears well hydrated and in no acute distress Blood pressure repeated both arms 140/86 right 142/70 left. HEENT: atraumatic, conjunctiva  clear, no obvious abnormalities on inspection of external nose and ears  MS: moves all extremities without noticeable focal  abnormality  PSYCH: pleasant and cooperative,  Lab Results  Component Value Date   WBC 5.3 06/20/2012   HGB 13.5 06/20/2012   HCT 38.8 06/20/2012   PLT 193.0 06/20/2012   GLUCOSE 124* 06/20/2012   CHOL 146 06/20/2012   TRIG 66.0 06/20/2012   HDL 45.00 06/20/2012   LDLCALC 88 06/20/2012   ALT 25 06/20/2012   AST 20 06/20/2012   NA 137 06/20/2012   K 4.4 06/20/2012   CL 101 06/20/2012   CREATININE 0.7 06/20/2012   BUN 11 06/20/2012   CO2 28 06/20/2012   TSH 2.31 06/20/2012   HGBA1C 7.2* 01/23/2013   MICROALBUR 0.6 06/20/2012    ASSESSMENT AND PLAN:  Discussed the following assessment and plan:  DIABETES MELLITUS, TYPE II - recheck inabout 4 months   Elevated blood pressure reading - confirm monitor  lsi treat if elevated  Slight deterioration of A1 C. patient to wear would like to try intensify lifestyle exercise etc. We did review some of the other option out on medications that don't cause weight gain.  flu vaccine declined today Consider Prevnar in the future. Would have her check blood pressure in an appropriate setting and document more data points. Briefly discussed ACE inhibitors as an option if she does have hypertension established.  She prefers to not add new medications unless a compelling indication. Counseled.  -Patient advised to return or notify health care team  if symptoms worsen or persist or new concerns arise.      Patient Instructions  Intensify lifestyle interventions. To  control blood sugars  And also.  Make sure  Blood pressure readings are below 140/90 . Hg a1c in April and then  ROV .    How to Take Your Blood Pressure  These instructions are only for electronic home blood pressure machines. You will need:   An automatic or semi-automatic blood pressure machine.  Fresh batteries for the blood pressure machine. HOW DO I USE THESE TOOLS TO CHECK MY BLOOD PRESSURE?   There are 2 numbers that make up your blood pressure. For example: 120/80.  The first number (120 in our example) is called the "systolic pressure." It is a measure of the pressure in your blood vessels when your heart is pumping blood.  The second number (80 in our example) is called the "diastolic pressure." It is a measure of the pressure in your blood vessels when your heart is resting between beats.  Before you buy a home blood pressure machine, check the size of your arm so you can buy the right size cuff. Here is how to check the size of your arm:  Use a tape measure that shows both inches and centimeters.  Wrap the tape measure around the middle upper part of your arm. You may need someone to help you measure right.  Write down your arm measurement in both inches and centimeters.  To measure your blood pressure right, it is important to have the right size cuff.  If your arm is up to 13 inches (37 to 34 centimeters), get an adult cuff size.  If your arm is 13 to 17 inches (35 to 44 centimeters), get a large adult cuff size.  If your arm is 17 to 20 inches (45 to 52 centimeters), get an adult thigh cuff.  Try to rest or relax for at least 30 minutes before you check your blood pressure.  Do not smoke.  Do not have any drinks with caffeine, such as:  Pop.  Coffee.  Tea.  Check your blood pressure in a quiet room.  Sit down and stretch out your arm on a table. Keep your arm at about the level of your heart. Let your arm relax. GETTING BLOOD PRESSURE READINGS  Make  sure you remove any tight-fighting clothing from your arm. Wrap the cuff around your upper arm. Wrap it just above the bend, and above where you felt the pulse. You should be able to slip a finger between the cuff and your arm. If you cannot slip a finger in the cuff, it is too tight and should be removed and rewrapped.  Some units requires you to manually pump up the arm cuff.  Automatic units inflate the cuff when you press a button.  Cuff deflation is automatic in both models.  After the cuff is inflated, the unit measures your blood pressure and pulse. The readings are displayed on a monitor. Hold still and breathe normally while the cuff is inflated.  Getting a reading takes less than a minute.  Some models store readings in a memory. Some provide a printout of readings.  Get readings at different times of the day. You should wait at least 5 minutes between readings. Take readings with you to your next doctor's visit. Document Released: 01/05/2008 Document Revised: 04/16/2011 Document Reviewed: 01/05/2008 [facility] Patient Information 2014 [place], [place].    Neta Mends. Panosh M.D.   Pre visit review using our clinic review tool, if applicable. No additional management support is needed unless otherwise documented below in the visit note.

## 2013-01-26 NOTE — Patient Instructions (Addendum)
Intensify lifestyle interventions. To control blood sugars  And also.  Make sure  Blood pressure readings are below 140/90 . Hg a1c in April and then ROV .    How to Take Your Blood Pressure  These instructions are only for electronic home blood pressure machines. You will need:   An automatic or semi-automatic blood pressure machine.  Fresh batteries for the blood pressure machine. HOW DO I USE THESE TOOLS TO CHECK MY BLOOD PRESSURE?   There are 2 numbers that make up your blood pressure. For example: 120/80.  The first number (120 in our example) is called the "systolic pressure." It is a measure of the pressure in your blood vessels when your heart is pumping blood.  The second number (80 in our example) is called the "diastolic pressure." It is a measure of the pressure in your blood vessels when your heart is resting between beats.  Before you buy a home blood pressure machine, check the size of your arm so you can buy the right size cuff. Here is how to check the size of your arm:  Use a tape measure that shows both inches and centimeters.  Wrap the tape measure around the middle upper part of your arm. You may need someone to help you measure right.  Write down your arm measurement in both inches and centimeters.  To measure your blood pressure right, it is important to have the right size cuff.  If your arm is up to 13 inches (37 to 34 centimeters), get an adult cuff size.  If your arm is 13 to 17 inches (35 to 44 centimeters), get a large adult cuff size.  If your arm is 17 to 20 inches (45 to 52 centimeters), get an adult thigh cuff.  Try to rest or relax for at least 30 minutes before you check your blood pressure.  Do not smoke.  Do not have any drinks with caffeine, such as:  Pop.  Coffee.  Tea.  Check your blood pressure in a quiet room.  Sit down and stretch out your arm on a table. Keep your arm at about the level of your heart. Let your arm  relax. GETTING BLOOD PRESSURE READINGS  Make sure you remove any tight-fighting clothing from your arm. Wrap the cuff around your upper arm. Wrap it just above the bend, and above where you felt the pulse. You should be able to slip a finger between the cuff and your arm. If you cannot slip a finger in the cuff, it is too tight and should be removed and rewrapped.  Some units requires you to manually pump up the arm cuff.  Automatic units inflate the cuff when you press a button.  Cuff deflation is automatic in both models.  After the cuff is inflated, the unit measures your blood pressure and pulse. The readings are displayed on a monitor. Hold still and breathe normally while the cuff is inflated.  Getting a reading takes less than a minute.  Some models store readings in a memory. Some provide a printout of readings.  Get readings at different times of the day. You should wait at least 5 minutes between readings. Take readings with you to your next doctor's visit. Document Released: 01/05/2008 Document Revised: 04/16/2011 Document Reviewed: 01/05/2008 [facility] Patient Information 2014 [place], [place].

## 2013-01-31 ENCOUNTER — Other Ambulatory Visit: Payer: Self-pay | Admitting: Internal Medicine

## 2013-02-20 ENCOUNTER — Other Ambulatory Visit: Payer: Self-pay | Admitting: *Deleted

## 2013-02-20 ENCOUNTER — Encounter: Payer: Self-pay | Admitting: Cardiology

## 2013-02-20 ENCOUNTER — Ambulatory Visit (INDEPENDENT_AMBULATORY_CARE_PROVIDER_SITE_OTHER): Payer: BC Managed Care – PPO | Admitting: Cardiology

## 2013-02-20 VITALS — BP 134/78 | HR 66 | Ht 66.0 in | Wt 190.0 lb

## 2013-02-20 DIAGNOSIS — I2510 Atherosclerotic heart disease of native coronary artery without angina pectoris: Secondary | ICD-10-CM

## 2013-02-20 MED ORDER — ATORVASTATIN CALCIUM 80 MG PO TABS: 80.0000 mg | ORAL_TABLET | Freq: Every day | ORAL | Status: DC

## 2013-02-20 NOTE — Patient Instructions (Signed)
Your physician wants you to follow-up in: ONE YEAR WITH DR CRENSHAW You will receive a reminder letter in the mail two months in advance. If you don't receive a letter, please call our office to schedule the follow-up appointment.   INCREASE LIPITOR TO 80 MG ONCE DAILY  Your physician recommends that you return for lab work in: 4 WEEKS= DO NOT EAT PRIOR TO LAB WORK 

## 2013-02-20 NOTE — Assessment & Plan Note (Signed)
Change Lipitor to 80 mg daily. Check lipids and liver in 6 weeks. 

## 2013-02-20 NOTE — Progress Notes (Signed)
HPI: FU CAD. Previous pt of Dr Verl Blalock. Previous PCI of her RCA following myocardial infarction in 2003. Echocardiogram in 2004 showed normal LV function. Nuclear study in May of 2011 showed an ejection fraction of 73%. 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 Patient last seen in December of 2013. Since then, the patient denies any dyspnea on exertion, orthopnea, PND, pedal edema, palpitations, syncope or chest pain.    Current Outpatient Prescriptions  Medication Sig Dispense Refill  . aspirin 81 MG tablet Take 81 mg by mouth daily.        Marland Kitchen atorvastatin (LIPITOR) 40 MG tablet Take 1 tablet (40 mg total) by mouth daily.  90 tablet  3  . B-D UF III MINI PEN NEEDLES 31G X 5 MM MISC USE AS DIRECTED  300 each  3  . BYETTA 10 MCG PEN 10 MCG/0.04ML SOPN injection INJECT 10MCG UNDER THE SKIN TWICE DAILY  2.4 mL  5  . clindamycin (CLEOCIN) 150 MG capsule Take by mouth. 4 tabs prior to dental work       . glucose blood (ONE TOUCH ULTRA TEST) test strip Test twice daily.  100 each  11  . Lancets (ONETOUCH ULTRASOFT) lancets USE 1 DAILY  100 each  11  . metFORMIN (GLUCOPHAGE-XR) 500 MG 24 hr tablet TAKE 2 TABLETS BY MOUTH TWICE DAILY WITH MEALS  360 tablet  1  . Multiple Vitamins-Minerals (MULTIVITAMIN PO) Take by mouth daily.      Marland Kitchen omeprazole (PRILOSEC) 20 MG capsule TAKE 1 CAPSULE BY MOUTH EVERY DAY  90 capsule  2  . ZETIA 10 MG tablet TAKE 1 TABLET BY MOUTH EVERY DAY  90 tablet  2   No current facility-administered medications for this visit.     Past Medical History  Diagnosis Date  . DIABETES MELLITUS, TYPE II 12/09/2009  . HYPERLIPIDEMIA-MIXED 04/14/2008  . OVERWEIGHT/OBESITY 05/13/2009  . CAD, NATIVE VESSEL 05/13/2009  . Occult blood in stools   . Anemia     as a child   . Hx of abnormal cervical Pap smear     cryo  but normal since then   . Ulcer     Duodenal ulcer by x-ray 40 years ago  . Osteopenia   . Obesity   . Myocardial infarction     2003  .  Gastritis   . Gastric ulcer   . Intestinal metaplasia of gastric mucosa 2012    Past Surgical History  Procedure Laterality Date  . Tubal ligation      One Tube  . Dilation and curettage of uterus    . Fibula fracture surgery    . 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 Cesarean section      x2  . Tonsillectomy    . Ganglion cyst excision    . Colonoscopy    . Upper gastrointestinal endoscopy    . Ectopic pregnancy surgery      History   Social History  . Marital Status: Married    Spouse Name: N/A    Number of Children: 2  . Years of Education: phd    Occupational History  . 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 Smoking status: Former Smoker    Quit date: 02/05/1994  . Smokeless tobacco: Never Used  . Alcohol Use: No  . Drug Use: No  .  Sexual Activity: Not on file   Other Topics Concern  . 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    No firearms    NO reg exercise    Pets- Poodles 2    Sleep 7 hours     ROS: no fevers or chills, productive cough, hemoptysis, dysphasia, odynophagia, melena, hematochezia, dysuria, hematuria, rash, seizure activity, orthopnea, PND, pedal edema, claudication. Remaining systems are negative.  Physical Exam: Well-developed well-nourished in no acute distress.  Skin is warm and dry.  HEENT is normal.  Neck is supple.  Chest is clear to auscultation with normal expansion.  Cardiovascular exam is regular rate and rhythm.  Abdominal exam nontender or distended. No masses palpated. Extremities show no edema. neuro grossly intact  ECG sinus rhythm at a rate of 66. First degree AV block. Prior inferior infarct.

## 2013-02-20 NOTE — Assessment & Plan Note (Signed)
Continue aspirin and statin. 

## 2013-02-23 ENCOUNTER — Encounter: Payer: Self-pay | Admitting: Cardiology

## 2013-03-20 ENCOUNTER — Other Ambulatory Visit (INDEPENDENT_AMBULATORY_CARE_PROVIDER_SITE_OTHER): Payer: BC Managed Care – PPO

## 2013-03-20 ENCOUNTER — Encounter: Payer: Self-pay | Admitting: Internal Medicine

## 2013-03-20 DIAGNOSIS — I2510 Atherosclerotic heart disease of native coronary artery without angina pectoris: Secondary | ICD-10-CM

## 2013-03-20 LAB — LIPID PANEL
Cholesterol: 132 mg/dL (ref 0–200)
HDL: 42.6 mg/dL (ref >39.00)
LDL Cholesterol: 77 mg/dL (ref 0–99)
Total CHOL/HDL Ratio: 3
Triglycerides: 64 mg/dL (ref 0.0–149.0)
VLDL: 12.8 mg/dL (ref 0.0–40.0)

## 2013-03-20 LAB — HEPATIC FUNCTION PANEL
ALT: 27 U/L (ref 0–35)
AST: 24 U/L (ref 0–37)
Albumin: 4 g/dL (ref 3.5–5.2)
Alkaline Phosphatase: 66 U/L (ref 39–117)
Bilirubin, Direct: 0.1 mg/dL (ref 0.0–0.3)
Total Bilirubin: 0.8 mg/dL (ref 0.3–1.2)
Total Protein: 6.7 g/dL (ref 6.0–8.3)

## 2013-03-23 ENCOUNTER — Other Ambulatory Visit: Payer: Self-pay | Admitting: Family Medicine

## 2013-03-23 ENCOUNTER — Encounter: Payer: Self-pay | Admitting: *Deleted

## 2013-03-23 MED ORDER — ONETOUCH DELICA LANCETS 33G MISC: Status: DC

## 2013-04-06 ENCOUNTER — Other Ambulatory Visit: Payer: Self-pay | Admitting: Internal Medicine

## 2013-05-15 ENCOUNTER — Other Ambulatory Visit: Payer: BC Managed Care – PPO

## 2013-05-15 ENCOUNTER — Other Ambulatory Visit (INDEPENDENT_AMBULATORY_CARE_PROVIDER_SITE_OTHER): Payer: BC Managed Care – PPO

## 2013-05-15 DIAGNOSIS — IMO0001 Reserved for inherently not codable concepts without codable children: Secondary | ICD-10-CM

## 2013-05-15 DIAGNOSIS — E1165 Type 2 diabetes mellitus with hyperglycemia: Principal | ICD-10-CM

## 2013-05-15 LAB — HEMOGLOBIN A1C: HEMOGLOBIN A1C: 7.3 % — AB (ref 4.6–6.5)

## 2013-05-22 ENCOUNTER — Ambulatory Visit: Payer: BC Managed Care – PPO | Admitting: Internal Medicine

## 2013-05-23 ENCOUNTER — Other Ambulatory Visit: Payer: Self-pay | Admitting: Internal Medicine

## 2013-05-29 ENCOUNTER — Ambulatory Visit (INDEPENDENT_AMBULATORY_CARE_PROVIDER_SITE_OTHER): Payer: BC Managed Care – PPO | Admitting: Internal Medicine

## 2013-05-29 ENCOUNTER — Encounter: Payer: Self-pay | Admitting: Internal Medicine

## 2013-05-29 VITALS — BP 118/76 | HR 67 | Temp 98.7°F | Wt 192.0 lb

## 2013-05-29 DIAGNOSIS — IMO0001 Reserved for inherently not codable concepts without codable children: Secondary | ICD-10-CM

## 2013-05-29 DIAGNOSIS — E1165 Type 2 diabetes mellitus with hyperglycemia: Principal | ICD-10-CM

## 2013-05-29 DIAGNOSIS — E785 Hyperlipidemia, unspecified: Secondary | ICD-10-CM

## 2013-05-29 DIAGNOSIS — I2510 Atherosclerotic heart disease of native coronary artery without angina pectoris: Secondary | ICD-10-CM

## 2013-05-29 MED ORDER — CANAGLIFLOZIN 100 MG PO TABS: 1.0000 | ORAL_TABLET | Freq: Every day | ORAL | Status: DC

## 2013-05-29 NOTE — Progress Notes (Signed)
Chief Complaint  Patient presents with  . Follow-up    4 month f/u  . Diabetes    HPI: Fu dm and ht : etc  BP has been better   DM: sugars taking meds  No lows   Could be better with lsi  No vision change or change in vascular status  CAD LIPIDS incr to 80dipitor per Dr.  Stanford Breed Snores ? If has sleep apnea. ? fam hx. For osa but father snored  Sleeps "like a dead person" no choking gasping wakening s ROS: See pertinent positives and negatives per HPI. Had left face pain saw dentist was rx for sinsusits and poss got better gets left ache at times when chews   Past Medical History  Diagnosis Date  . DIABETES MELLITUS, TYPE II 12/09/2009  . HYPERLIPIDEMIA-MIXED 04/14/2008  . OVERWEIGHT/OBESITY 05/13/2009  . CAD, NATIVE VESSEL 05/13/2009  . Occult blood in stools   . Anemia     as a child   . Hx of abnormal cervical Pap smear     cryo  but normal since then   . Ulcer     Duodenal ulcer by x-ray 40 years ago  . Osteopenia   . Obesity   . Myocardial infarction     2003  . Gastritis   . Gastric ulcer   . Intestinal metaplasia of gastric mucosa 2012    Family History  Problem Relation Age of Onset  . Emphysema Mother     Smoker  . Heart attack Mother     due to head injury  . Alcohol abuse Mother   . Heart disease Mother   . Early death Mother   . Lung cancer Father   . Heart attack Father   . Hypertension Father   . Alcohol abuse Father   . Heart disease Father   . Aneurysm Father   . Alcohol abuse Brother   . Diabetes Brother   . Diabetes Brother   . Aneurysm Paternal Grandmother   . Diabetes      grandfather  . Colon cancer Neg Hx   . Esophageal cancer Neg Hx   . Stomach cancer Neg Hx   . Sudden death Brother     scd    History   Social History  . Marital Status: Married    Spouse Name: N/A    Number of Children: 2  . Years of Education: phd    Occupational History  . 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 Smoking status: Former Smoker    Quit date: 02/05/1994  . Smokeless tobacco: Never Used  . Alcohol Use: No  . Drug Use: No  . Sexual Activity: None   Other Topics Concern  . 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    No firearms    NO reg exercise    Pets- Poodles 2    Sleep 7 hours     Outpatient Encounter Prescriptions as of 05/29/2013  Medication Sig  . aspirin 81 MG tablet Take 81 mg by mouth daily.    Marland Kitchen  atorvastatin (LIPITOR) 80 MG tablet Take 1 tablet (80 mg total) by mouth daily.  . B-D UF III MINI PEN NEEDLES 31G X 5 MM MISC USE AS DIRECTED  . BYETTA 10 MCG PEN 10 MCG/0.04ML SOPN injection INJECT 10 MCG UNDER THE SKIN TWICE DAILY  . clindamycin (CLEOCIN) 150 MG capsule Take by mouth. 4 tabs prior to dental work   . glucose blood (ONE TOUCH ULTRA TEST) test strip Test twice daily.  . Lancets (ONETOUCH ULTRASOFT) lancets USE 1 DAILY  . metFORMIN (GLUCOPHAGE-XR) 500 MG 24 hr tablet TAKE 2 TABLETS BY MOUTH TWICE DAILY WITH MEALS  . Multiple Vitamins-Minerals (MULTIVITAMIN PO) Take by mouth daily.  Marland Kitchen omeprazole (PRILOSEC) 20 MG capsule TAKE 1 CAPSULE BY MOUTH EVERY DAY  . ONETOUCH DELICA LANCETS 99B MISC Use as directed  . ZETIA 10 MG tablet TAKE 1 TABLET BY MOUTH EVERY DAY  . Canagliflozin (INVOKANA) 100 MG TABS Take 1 tablet (100 mg total) by mouth daily.    EXAM:  BP 118/76  Pulse 67  Temp(Src) 98.7 F (37.1 C) (Oral)  Wt 192 lb (87.091 kg)  SpO2 98%  Body mass index is 31 kg/(m^2).  GENERAL: vitals reviewed and listed above, alert, oriented, appears well hydrated and in no acute distress HEENT: atraumatic, conjunctiva  clear, no obvious abnormalities on inspection of external nose and earsC MS: moves all extremities without noticeable  focal  abnormality PSYCH: pleasant and cooperative, no obvious depression or anxiety Lab Results  Component Value Date   WBC 5.3 06/20/2012   HGB 13.5 06/20/2012   HCT 38.8 06/20/2012   PLT 193.0 06/20/2012   GLUCOSE 124* 06/20/2012   CHOL 132 03/20/2013   TRIG 64.0 03/20/2013   HDL 42.60 03/20/2013   LDLCALC 77 03/20/2013   ALT 27 03/20/2013   AST 24 03/20/2013   NA 137 06/20/2012   K 4.4 06/20/2012   CL 101 06/20/2012   CREATININE 0.7 06/20/2012   BUN 11 06/20/2012   CO2 28 06/20/2012   TSH 2.31 06/20/2012   HGBA1C 7.3* 05/15/2013   MICROALBUR 0.6 06/20/2012    ASSESSMENT AND PLAN:  Discussed the following assessment and plan:  Type II or unspecified type diabetes mellitus without mention of complication, uncontrolled - still not at optimum goal  disc risk benefit of other med classes she wants to avoid weigh tgaining meds   Coronary atherosclerosis of native coronary artery  Other and unspecified hyperlipidemia BP Readings from Last 3 Encounters:  05/29/13 118/76  02/20/13 134/78  01/26/13 140/80  disc ddp4 and flozin sglt2 classes  As add ons.  Samples and rx given but she should also look  Into se of meds / Total visit 1mins > 50% spent counseling and coordinating care   -Patient advised to return or notify health care team  if symptoms worsen ,persist or new concerns arise.  Patient Instructions  Diabetes is stable but control goal could be better.   considier  Invokanna. 100 mg per day   . There is a higher dose of 300 mg also.  Intensify lifestyle interventions. As discussed. Let us know if you wish to get sleep consult.  ROV with hg a1c pre visit in another 4 months.   Standley Brooking. Masaru Chamberlin M.D.

## 2013-05-29 NOTE — Patient Instructions (Addendum)
Diabetes is stable but control goal could be better.   considier  Invokanna. 100 mg per day   . There is a higher dose of 300 mg also.  Intensify lifestyle interventions. As discussed. Let us know if you wish to get sleep consult.  ROV with hg a1c pre visit in another 4 months.

## 2013-05-29 NOTE — Progress Notes (Signed)
Pre visit review using our clinic review tool, if applicable. No additional management support is needed unless otherwise documented below in the visit note. 

## 2013-06-19 ENCOUNTER — Telehealth: Payer: Self-pay | Admitting: Internal Medicine

## 2013-06-19 NOTE — Telephone Encounter (Signed)
Misty will ask md can she prescribe abx without pt going to Saturday clinic

## 2013-06-19 NOTE — Telephone Encounter (Signed)
Left a message for the pt to return my call.  

## 2013-06-19 NOTE — Telephone Encounter (Signed)
Tried once again before leaving the office for the weekend.

## 2013-06-19 NOTE — Telephone Encounter (Signed)
Pt was put on invokana and one of side effect is UTI. Pt has uti and would like abx call into [facility]

## 2013-06-19 NOTE — Telephone Encounter (Signed)
lmom for pt to call back

## 2013-06-19 NOTE — Telephone Encounter (Signed)
Please schedule pt on [facility] schedule.  Thanks!

## 2013-06-22 NOTE — Telephone Encounter (Signed)
Noted  We can disc  At her visits sometimes can do self tx if meets criteria.  Original message was lacking clinical data and hx  enough to prescribe over the phone  unfortunate phone tag.

## 2013-06-22 NOTE — Telephone Encounter (Signed)
Patient was seen at [facility] on Sunday.  Was dx with a UTI.  Given Macrobid. Continues to take Invokana.  Patient notified to call the office if not getting better after completing the antibiotics.

## 2013-07-03 ENCOUNTER — Other Ambulatory Visit: Payer: Self-pay | Admitting: *Deleted

## 2013-07-03 DIAGNOSIS — I83893 Varicose veins of bilateral lower extremities with other complications: Secondary | ICD-10-CM

## 2013-07-06 ENCOUNTER — Other Ambulatory Visit: Payer: Self-pay | Admitting: Internal Medicine

## 2013-07-07 NOTE — Telephone Encounter (Signed)
Sent to the pharmacy by e-scribe. 

## 2013-08-06 ENCOUNTER — Other Ambulatory Visit: Payer: Self-pay | Admitting: Internal Medicine

## 2013-08-10 ENCOUNTER — Other Ambulatory Visit: Payer: Self-pay | Admitting: Internal Medicine

## 2013-08-13 ENCOUNTER — Other Ambulatory Visit: Payer: Self-pay | Admitting: Internal Medicine

## 2013-08-28 ENCOUNTER — Encounter: Payer: Self-pay | Admitting: Surgery

## 2013-08-31 ENCOUNTER — Ambulatory Visit (INDEPENDENT_AMBULATORY_CARE_PROVIDER_SITE_OTHER): Payer: BC Managed Care – PPO | Admitting: Surgery

## 2013-08-31 ENCOUNTER — Ambulatory Visit (HOSPITAL_COMMUNITY)
Admission: RE | Admit: 2013-08-31 | Discharge: 2013-08-31 | Disposition: A | Discharge status: Home / Self Care | Payer: BC Managed Care – PPO | Source: Ambulatory Visit | Attending: Surgery | Admitting: Surgery

## 2013-08-31 ENCOUNTER — Encounter: Payer: Self-pay | Admitting: Surgery

## 2013-08-31 VITALS — BP 115/55 | HR 64 | Ht 66.0 in | Wt 184.0 lb

## 2013-08-31 DIAGNOSIS — R0989 Other specified symptoms and signs involving the circulatory and respiratory systems: Secondary | ICD-10-CM

## 2013-08-31 DIAGNOSIS — Z8249 Family history of ischemic heart disease and other diseases of the circulatory system: Secondary | ICD-10-CM

## 2013-08-31 DIAGNOSIS — I83893 Varicose veins of bilateral lower extremities with other complications: Secondary | ICD-10-CM

## 2013-08-31 NOTE — Addendum Note (Signed)
Addended by: Mena Goes on: 08/31/2013 04:27 PM   Modules accepted: Orders

## 2013-08-31 NOTE — Progress Notes (Signed)
Patient name: Morgan [name] MRN: [id number] DOB: 1950-03-11 Sex: female   Referred by: Dr. Martinique  Reason for referral:  Chief Complaint  Patient presents with  . New Evaluation    bilateral vv's     HISTORY OF PRESENT ILLNESS: This is a very pleasant 63 year old female who is referred today for evaluation of varicose veins.  The patient states that she has had problems with her veins from a long time.  She complains that date 8.  Particularly the right leg.  This is worse after prolonged standing.  She also complains of left leg swelling particularly around her ankle.  She denies nonhealing wounds.  The patient is a diabetic.  Her most recent hemoglobin A1c is 7.4.  She had a heart attack at age 32.  Her hypercholesterolemia is managed with a statin.  She is a former smoker.  The patient has a family history of abdominal aortic aneurysm.  Past Medical History  Diagnosis Date  . DIABETES MELLITUS, TYPE II 12/09/2009  . HYPERLIPIDEMIA-MIXED 04/14/2008  . OVERWEIGHT/OBESITY 05/13/2009  . CAD, NATIVE VESSEL 05/13/2009  . Occult blood in stools   . Anemia     as a child   . Hx of abnormal cervical Pap smear     cryo  but normal since then   . Ulcer     Duodenal ulcer by x-ray 40 years ago  . Osteopenia   . Obesity   . Myocardial infarction     2003  . Gastritis   . Gastric ulcer   . Intestinal metaplasia of gastric mucosa 2012    Past Surgical History  Procedure Laterality Date  . Tubal ligation      One Tube  . Dilation and curettage of uterus    . Fibula fracture surgery    . 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 Cesarean section      x2  . Tonsillectomy    . Ganglion cyst excision    . Colonoscopy    . Upper gastrointestinal endoscopy    . Ectopic pregnancy surgery      History   Social History  . Marital Status: Married    Spouse Name: N/A    Number of Children: 2  . Years of Education: phd    Occupational History    . 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 Smoking status: Former Smoker    Quit date: 02/05/1994  . Smokeless tobacco: Never Used  . Alcohol Use: No  . Drug Use: No  . Sexual Activity: Not on file   Other Topics Concern  . 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    No firearms    NO reg exercise    Pets- Poodles 2    Sleep 7 hours     Family History  Problem Relation Age of Onset  . Emphysema Mother     Smoker  . Heart attack Mother     due to head injury  . Alcohol  abuse Mother   . Heart disease Mother   . Early death Mother   . Varicose Veins Mother   . AAA (abdominal aortic aneurysm) Mother   . Lung cancer Father   . Heart attack Father   . Hypertension Father   . Alcohol abuse Father   . Heart disease Father     before age 27  . Aneurysm Father   . Hyperlipidemia Father   . Alcohol abuse Brother   . Diabetes Brother   . Heart disease Brother   . Heart attack Brother   . Diabetes Brother   . Aneurysm Paternal Grandmother   . Diabetes      grandfather  . Colon cancer Neg Hx   . Esophageal cancer Neg Hx   . Stomach cancer Neg Hx   . Sudden death Brother     scd    Allergies as of 08/31/2013 - Review Complete 08/31/2013  Allergen Reaction Noted  . Penicillins  02/24/2010    Current Outpatient Prescriptions on File Prior to Visit  Medication Sig Dispense Refill  . aspirin 81 MG tablet Take 81 mg by mouth daily.        Marland Kitchen atorvastatin (LIPITOR) 80 MG tablet Take 1 tablet (80 mg total) by mouth daily.  90 tablet  3  . B-D UF III MINI PEN NEEDLES 31G X 5 MM MISC USE AS DIRECTED  300 each  3  . BYETTA 10 MCG PEN 10 MCG/0.04ML SOPN injection INJECT 10 UNITS SUBCUTANEOUS TWICE DAILY  2.4  mL  3  . Canagliflozin (INVOKANA) 100 MG TABS Take 1 tablet (100 mg total) by mouth daily.  30 tablet  5  . clindamycin (CLEOCIN) 150 MG capsule Take by mouth. 4 tabs prior to dental work       . glucose blood (ONE TOUCH ULTRA TEST) test strip Test twice daily.  100 each  11  . Lancets (ONETOUCH ULTRASOFT) lancets USE 1 DAILY  100 each  11  . metFORMIN (GLUCOPHAGE-XR) 500 MG 24 hr tablet TAKE 2 TABLETS BY MOUTH TWICE DAILY WITH MEALS  360 tablet  1  . Multiple Vitamins-Minerals (MULTIVITAMIN PO) Take by mouth daily.      Marland Kitchen omeprazole (PRILOSEC) 20 MG capsule TAKE 1 CAPSULE BY MOUTH EVERY DAY  90 capsule  0  . ONETOUCH DELICA LANCETS 42H MISC Use as directed  100 each  10  . ZETIA 10 MG tablet TAKE 1 TABLET BY MOUTH EVERY DAY  90 tablet  1   No current facility-administered medications on file prior to visit.     REVIEW OF SYSTEMS: Cardiovascular: No chest pain, chest pressure, palpitations, orthopnea, or dyspnea on exertion. No claudication or rest pain,  positive for leg swelling and verrucous vein Pulmonary: No productive cough, asthma or wheezing. Neurologic: No weakness, paresthesias, aphasia, or amaurosis. No dizziness. Hematologic: No bleeding problems or clotting disorders. Musculoskeletal: No joint pain or joint swelling. Gastrointestinal: No blood in stool or hematemesis Genitourinary: No dysuria or hematuria. Psychiatric:: No history of major depression. Integumentary: No rashes or ulcers. Constitutional: No fever or chills.  PHYSICAL EXAMINATION: General: The patient appears their stated age.  Vital signs are BP 115/55  Pulse 64  Ht 5\' 6"  (1.676 m)  Wt 184 lb (83.462 kg)  BMI 29.71 kg/m2  SpO2 100% HEENT:  No gross abnormalities Pulmonary: Respirations are non-labored Abdomen: Soft and non-tender, prominent aortic pulse Musculoskeletal: There are no major deformities.   Neurologic: No focal weakness or paresthesias are detected,  Skin: There are no ulcer or rashes  noted. Psychiatric: The patient has normal affect. Cardiovascular: There is a regular rate and rhythm without significant murmur appreciated.  Faintly palpable pedal pulses prominent varicosity of the posterior side of the right leg leading into the anterior medial lower leg.  Multiple reticular veins also present.  There is also a reticular vein complex and the left distal medial thigh.  Diagnostic Studies: Venous ultrasound images were performed and reviewed by myself.  She has reflux within the right saphenofemoral junction as well as the lower draining on the right.  There are also 2 areas of reflux in the short saphenous vein on the right.  Maximum diameter is 0.81 cm of the saphenofemoral junction.  On the left, there is reflux at the saphenofemoral junction with maximum diameter of 0.81 cm.  There is reflux throughout the left short saphenous vein was diameter measurements of 0.37 cm.  She has deep vein reflux bilaterally.   Assessment:  Venous insufficiency Plan: The patient has a very prominent varicosity on her right posterior leg which extends around the medial side to the lower leg which is very bothersome to her.  This appears to be fed by a refluxing segment of the right great saphenous vein.  She also has swelling in the left leg and reflux within the left short saphenous vein.  The patient has been encouraged to keep her legs elevated when possible.  I have also given her a prescription for 20-30 mm compression girdle type compression stockings.  The patient will followup in 3 months to discuss her treatment options, should she not get benefit from a compression stockings.  With the patient's family history of abdominal aortic aneurysmal disease and prominent aortic pulse on examination.  I will get an ultrasound of her abdomen she returns.     Eldridge Abrahams, M.D. [facility] Office: [phone] Pager:  [phone]

## 2013-09-25 ENCOUNTER — Encounter: Payer: Self-pay | Admitting: Internal Medicine

## 2013-09-25 ENCOUNTER — Ambulatory Visit (INDEPENDENT_AMBULATORY_CARE_PROVIDER_SITE_OTHER): Payer: BC Managed Care – PPO | Admitting: Internal Medicine

## 2013-09-25 VITALS — BP 120/80 | Temp 97.6°F | Ht 66.0 in | Wt 186.0 lb

## 2013-09-25 DIAGNOSIS — R300 Dysuria: Secondary | ICD-10-CM

## 2013-09-25 DIAGNOSIS — N390 Urinary tract infection, site not specified: Secondary | ICD-10-CM

## 2013-09-25 DIAGNOSIS — E119 Type 2 diabetes mellitus without complications: Secondary | ICD-10-CM

## 2013-09-25 LAB — POCT URINALYSIS DIP (MANUAL ENTRY)
BILIRUBIN UA: NEGATIVE
BILIRUBIN UA: NEGATIVE
Glucose, UA: 250
Nitrite, UA: NEGATIVE
Protein Ur, POC: NEGATIVE
Spec Grav, UA: <=1.005
Urobilinogen, UA: 0.2
pH, UA: 5.5

## 2013-09-25 LAB — BASIC METABOLIC PANEL
BUN: 10 mg/dL (ref 6–23)
CALCIUM: 9 mg/dL (ref 8.4–10.5)
CO2: 27 mEq/L (ref 19–32)
Chloride: 98 mEq/L (ref 96–112)
Creatinine, Ser: 0.7 mg/dL (ref 0.4–1.2)
GFR: 98.01 mL/min (ref >60.00)
Glucose, Bld: 124 mg/dL — ABNORMAL HIGH (ref 70–99)
Potassium: 4.2 mEq/L (ref 3.5–5.1)
Sodium: 133 mEq/L — ABNORMAL LOW (ref 135–145)

## 2013-09-25 LAB — HEMOGLOBIN A1C: Hgb A1c MFr Bld: 7 % — ABNORMAL HIGH (ref 4.6–6.5)

## 2013-09-25 MED ORDER — CIPROFLOXACIN HCL 500 MG PO TABS: 500.0000 mg | ORAL_TABLET | Freq: Two times a day (BID) | ORAL | Status: DC

## 2013-09-25 NOTE — Progress Notes (Signed)
Pre visit review using our clinic review tool, if applicable. No additional management support is needed unless otherwise documented below in the visit note.  Chief Complaint  Patient presents with  . Follow-up    HPI: Morgan [name] Follow ing dm Since last visit  Has seen dr Trula Slade for varicose vein issues.    DM: bg is better    Getting   Readings   Less than 140 most days.  120. Or less.  No low   uti  Sx  Ongoing.  2 rounds of cipro  X 2  cipro should work. Seen urgent care had culture not available to this provider  Now back.last rx in June.  Cloudy   Urine and  Dysuria on low fluids  For last days ROS: See pertinent positives and negatives per HPI.no cp sob fevers   Feels better about losing weight Declines flu vaccine skeptical about safety  Has tinea pedis lotrimin not curing it. Plans going to podiatry  Past Medical History  Diagnosis Date  . DIABETES MELLITUS, TYPE II 12/09/2009  . HYPERLIPIDEMIA-MIXED 04/14/2008  . OVERWEIGHT/OBESITY 05/13/2009  . CAD, NATIVE VESSEL 05/13/2009  . Occult blood in stools   . Anemia     as a child   . Hx of abnormal cervical Pap smear     cryo  but normal since then   . Ulcer     Duodenal ulcer by x-ray 40 years ago  . Osteopenia   . Obesity   . Myocardial infarction     2003  . Gastritis   . Gastric ulcer   . Intestinal metaplasia of gastric mucosa 2012    Family History  Problem Relation Age of Onset  . Emphysema Mother     Smoker  . Heart attack Mother     due to head injury  . Alcohol abuse Mother   . Heart disease Mother   . Early death Mother   . Varicose Veins Mother   . AAA (abdominal aortic aneurysm) Mother   . Lung cancer Father   . Heart attack Father   . Hypertension Father   . Alcohol abuse Father   . Heart disease Father     before age 40  . Aneurysm Father   . Hyperlipidemia Father   . Alcohol abuse Brother   . Diabetes Brother   . Heart disease Brother   . Heart attack Brother   . Diabetes  Brother   . Aneurysm Paternal Grandmother   . Diabetes      grandfather  . Colon cancer Neg Hx   . Esophageal cancer Neg Hx   . Stomach cancer Neg Hx   . Sudden death Brother     scd    History   Social History  . Marital Status: Married    Spouse Name: N/A    Number of Children: 2  . Years of Education: phd    Occupational History  . 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 Smoking status: Former Smoker    Quit date: 02/05/1994  . Smokeless tobacco: Never Used  . Alcohol Use: No  . Drug Use: No  . Sexual Activity: None   Other Topics Concern  . 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    No firearms    NO reg exercise    Pets- Poodles 2    Sleep 7 hours     Outpatient Encounter Prescriptions as of 09/25/2013  Medication Sig  . aspirin 81 MG tablet Take 81 mg by mouth daily.    Marland Kitchen atorvastatin (LIPITOR) 80 MG tablet Take 1 tablet (80 mg total) by mouth daily.  . B-D UF III MINI PEN NEEDLES 31G X 5 MM MISC USE AS DIRECTED  . BYETTA 10 MCG PEN 10 MCG/0.04ML SOPN injection INJECT 10 UNITS SUBCUTANEOUS TWICE DAILY  . Canagliflozin (INVOKANA) 100 MG TABS Take 1 tablet (100 mg total) by mouth daily.  . clindamycin (CLEOCIN) 150 MG capsule Take by mouth. 4 tabs prior to dental work   . glucose blood (ONE TOUCH ULTRA TEST) test strip Test twice daily.  . Lancets (ONETOUCH ULTRASOFT) lancets USE 1 DAILY  . metFORMIN (GLUCOPHAGE-XR) 500 MG 24 hr tablet TAKE 2 TABLETS BY MOUTH TWICE DAILY WITH MEALS  . Multiple Vitamins-Minerals (MULTIVITAMIN PO) Take by mouth daily.  Marland Kitchen omeprazole (PRILOSEC) 20 MG capsule TAKE 1 CAPSULE BY MOUTH EVERY DAY  . ONETOUCH DELICA LANCETS 62B MISC Use as directed  . ZETIA 10 MG tablet  TAKE 1 TABLET BY MOUTH EVERY DAY  . ciprofloxacin (CIPRO) 500 MG tablet Take 1 tablet (500 mg total) by mouth 2 (two) times daily.    EXAM:  BP 120/80  Temp(Src) 97.6 F (36.4 C) (Oral)  Ht 5\' 6"  (1.676 m)  Wt 186 lb (84.369 kg)  BMI 30.04 kg/m2  Body mass index is 30.04 kg/(m^2).  GENERAL: vitals reviewed and listed above, alert, oriented, appears well hydrated and in no acute distress cap refill  MS: moves all extremities without noticeable focal  abnormality PSYCH: pleasant and cooperative, no obvious depression or anxiety feet  Painted no obv deformity    ASSESSMENT AND PLAN:  Discussed the following assessment and plan:  Type II or unspecified type diabetes mellitus without mention of complication, not stated as uncontrolled - reporeted better  labs today some se of invokanna waned about reports of dka stay hydrated  - Plan: Hemoglobin A1c, POCT urinalysis dipstick, Urine culture, Basic metabolic panel  Dysuria - cloudy urine rx for uti since on invokanna  coirpo x 2 at [facility]  appanerly had cx done  - Plan: Hemoglobin A1c, POCT urinalysis dipstick, Urine culture, Basic metabolic panel  Recurrent UTI - by report x 2 since on invokanna   cluody urine  no pyelo. Would not rx bacteriuria just symptpomatic  Although seems to have some sx on further questioning.  [place] cranberry zinc prep from [place]  that helped a lot  Try Lamisil for her feet can see podiatry if needed  Declined flu vaccine   Concern about government intervention  problems   Wt Readings from Last 3 Encounters:  09/25/13 186 lb (84.369 kg)  08/31/13 184 lb (83.462 kg)  05/29/13 192 lb (87.091 kg)    -Patient advised to return or notify health care team  if symptoms worsen ,persist or new concerns arise.  Patient Instructions  Will notify you  of labs when available. Ok to do cranberry  Pills.  Continue healthy lifestyle.  Stay hydrated .  Consider  Antibiotic if needed.     Standley Brooking. Willisha Sligar  M.D.  Lab Results  Component Value Date   WBC 5.3 06/20/2012   HGB 13.5 06/20/2012   HCT 38.8 06/20/2012   PLT 193.0 06/20/2012   GLUCOSE 124* 09/25/2013   CHOL 132 03/20/2013   TRIG 64.0 03/20/2013   HDL 42.60 03/20/2013   LDLCALC 77 03/20/2013   ALT 27 03/20/2013   AST 24 03/20/2013   NA 133* 09/25/2013   K 4.2 09/25/2013   CL 98 09/25/2013   CREATININE 0.7 09/25/2013   BUN 10 09/25/2013   CO2 27 09/25/2013   TSH 2.31 06/20/2012   HGBA1C 7.0* 09/25/2013   MICROALBUR 0.6 06/20/2012

## 2013-09-25 NOTE — Patient Instructions (Signed)
Will notify you  of labs when available. Ok to do cranberry  Pills.  Continue healthy lifestyle.  Stay hydrated .  Consider  Antibiotic if needed.

## 2013-09-27 LAB — HM DIABETES EYE EXAM

## 2013-09-28 LAB — URINE CULTURE: Colony Count: >=100000

## 2013-09-29 ENCOUNTER — Encounter: Payer: Self-pay | Admitting: Internal Medicine

## 2013-10-01 ENCOUNTER — Telehealth: Payer: Self-pay | Admitting: Internal Medicine

## 2013-10-01 NOTE — Telephone Encounter (Signed)
Pt returned your call. Pt states if its about her labs results, she has read everything on my chart.  Pt aware of uti.and is taking rx. Unless you have any other new news, pt does not need a cb.

## 2013-10-02 ENCOUNTER — Other Ambulatory Visit: Payer: Self-pay | Admitting: Family Medicine

## 2013-10-02 DIAGNOSIS — E119 Type 2 diabetes mellitus without complications: Secondary | ICD-10-CM

## 2013-10-02 DIAGNOSIS — R739 Hyperglycemia, unspecified: Secondary | ICD-10-CM

## 2013-10-02 NOTE — Telephone Encounter (Signed)
lmovm to cb and sch appts

## 2013-10-02 NOTE — Telephone Encounter (Signed)
hgba1c and bmp in 4-6 months and OV  Please make both appointments.  I have placed the lab orders. Thanks!

## 2013-10-05 ENCOUNTER — Other Ambulatory Visit: Payer: Self-pay | Admitting: Internal Medicine

## 2013-10-06 ENCOUNTER — Other Ambulatory Visit: Payer: Self-pay | Admitting: Internal Medicine

## 2013-10-06 NOTE — Telephone Encounter (Signed)
Sent to the pharmacy by e-scribe. 

## 2013-10-06 NOTE — Telephone Encounter (Signed)
lmovm ot cb and resc.

## 2013-10-19 ENCOUNTER — Encounter: Payer: Self-pay | Admitting: Internal Medicine

## 2013-11-07 ENCOUNTER — Other Ambulatory Visit: Payer: Self-pay | Admitting: Internal Medicine

## 2013-11-09 ENCOUNTER — Other Ambulatory Visit: Payer: Self-pay | Admitting: Internal Medicine

## 2013-11-09 NOTE — Telephone Encounter (Signed)
Patient has upcoming appt in Feb.  Will fill until then.   Sent by e-scribe.

## 2013-11-09 NOTE — Telephone Encounter (Signed)
Sent to the pharmacy by e-scribe. 

## 2013-11-17 ENCOUNTER — Ambulatory Visit (INDEPENDENT_AMBULATORY_CARE_PROVIDER_SITE_OTHER): Payer: BC Managed Care – PPO | Admitting: Internal Medicine

## 2013-11-17 ENCOUNTER — Encounter: Payer: Self-pay | Admitting: Internal Medicine

## 2013-11-17 VITALS — BP 146/80 | Temp 98.1°F | Ht 66.0 in | Wt 183.0 lb

## 2013-11-17 DIAGNOSIS — N6459 Other signs and symptoms in breast: Secondary | ICD-10-CM | POA: Insufficient documentation

## 2013-11-17 DIAGNOSIS — N649 Disorder of breast, unspecified: Secondary | ICD-10-CM

## 2013-11-17 DIAGNOSIS — N6452 Nipple discharge: Secondary | ICD-10-CM

## 2013-11-17 NOTE — Progress Notes (Signed)
Pre visit review using our clinic review tool, if applicable. No additional management support is needed unless otherwise documented below in the visit note.   Chief Complaint  Patient presents with  . Left Breast/Nipple Sensitivity/Discomfort    Needs to discuss getting the flu vaccine.    HPI: Patient Morgan [name]  comes in today for SDA for  new problem evaluation. Onset sensitivity  Left breast oct 10th 3 days ago left  And went away?  Then noted awareness not really pain  And checked   For dc expressed  Pressed very hard  and   worried and scared.  Feels like  infection with not pain but sensitivity and  awareness . Clear . No blood  No hx of same no masses noted  Due for mammo  Has somewhat dense breasts  . Neg sig fam hx breast disease ROS: See pertinent positives and negatives per HPI.no fever adenopathy no estrogens new med  Past Medical History  Diagnosis Date  . DIABETES MELLITUS, TYPE II 12/09/2009  . HYPERLIPIDEMIA-MIXED 04/14/2008  . OVERWEIGHT/OBESITY 05/13/2009  . CAD, NATIVE VESSEL 05/13/2009  . Occult blood in stools   . Anemia     as a child   . Hx of abnormal cervical Pap smear     cryo  but normal since then   . Ulcer     Duodenal ulcer by x-ray 40 years ago  . Osteopenia   . Obesity   . Myocardial infarction     2003  . Gastritis   . Gastric ulcer   . Intestinal metaplasia of gastric mucosa 2012    Family History  Problem Relation Age of Onset  . Emphysema Mother     Smoker  . Heart attack Mother     due to head injury  . Alcohol abuse Mother   . Heart disease Mother   . Early death Mother   . Varicose Veins Mother   . AAA (abdominal aortic aneurysm) Mother   . Lung cancer Father   . Heart attack Father   . Hypertension Father   . Alcohol abuse Father   . Heart disease Father     before age 58  . Aneurysm Father   . Hyperlipidemia Father   . Alcohol abuse Brother   . Diabetes Brother   . Heart disease Brother   . Heart attack Brother    . Diabetes Brother   . Aneurysm Paternal Grandmother   . Diabetes      grandfather  . Colon cancer Neg Hx   . Esophageal cancer Neg Hx   . Stomach cancer Neg Hx   . Sudden death Brother     scd    History   Social History  . Marital Status: Married    Spouse Name: N/A    Number of Children: 2  . Years of Education: phd    Occupational History  . 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 Smoking status: Former Smoker    Quit date: 02/05/1994  . Smokeless tobacco: Never Used  . Alcohol Use: No  . Drug Use: No  . Sexual Activity: Not on file   Other Topics Concern  . 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    No firearms    NO reg exercise    Pets- Poodles 2    Sleep 7 hours     Outpatient Encounter Prescriptions as of 11/17/2013  Medication Sig  . aspirin 81 MG tablet Take 81 mg by mouth daily.    Marland Kitchen atorvastatin (LIPITOR) 80 MG tablet Take 1 tablet (80 mg total) by mouth daily.  . B-D UF III MINI PEN NEEDLES 31G X 5 MM MISC USE AS DIRECTED  . BYETTA 10 MCG PEN 10 MCG/0.04ML SOPN injection INJECT 10 UNITS SUBCUTANEOUSLY TWICE DAILY  . Canagliflozin (INVOKANA) 100 MG TABS Take 1 tablet (100 mg total) by mouth daily.  . clindamycin (CLEOCIN) 150 MG capsule Take by mouth. 4 tabs prior to dental work   . Lancets (ONETOUCH ULTRASOFT) lancets USE 1 DAILY  . metFORMIN (GLUCOPHAGE-XR) 500 MG 24 hr tablet TAKE 2 TABLETS BY MOUTH TWICE DAILY WITH MEALS  . Multiple Vitamins-Minerals (MULTIVITAMIN PO) Take by mouth daily.  Marland Kitchen omeprazole (PRILOSEC) 20 MG capsule TAKE 1 CAPSULE BY MOUTH EVERY DAY  . ONE TOUCH ULTRA TEST test strip USE AS DIRECTED TO TEST TWICE DAILY  . ONETOUCH DELICA LANCETS 06C MISC Use as  directed  . ZETIA 10 MG tablet TAKE 1 TABLET BY MOUTH EVERY DAY  . [DISCONTINUED] ciprofloxacin (CIPRO) 500 MG tablet Take 1 tablet (500 mg total) by mouth 2 (two) times daily.    EXAM:  BP 146/80  Temp(Src) 98.1 F (36.7 C) (Oral)  Ht 5\' 6"  (1.676 m)  Wt 183 lb (83.008 kg)  BMI 29.55 kg/m2  Body mass index is 29.55 kg/(m^2).  GENERAL: vitals reviewed and listed above, alert, oriented, appears well hydrated and in no acute distress HEENT: atraumatic, conjunctiva  clear, no obvious abnormalities on inspection of external nose and ears NECK: no obvious masses on inspection palpation  Breast: normal by inspection . No dimpling,  masses, tenderness  Lumpiness  Laterally  Left nipple nl   With moderate pressure and millin tiny amount of milky substance no blood  Axilla no nodules noted.  MS: moves all extremities without noticeable focal  abnormality PSYCH: pleasant and cooperative,   ASSESSMENT AND PLAN:  Discussed the following assessment and plan:  Breast discharge left - pt disc ondeep squeeze on exam deep pressure pinpoint milky laterally. no masses noted. no spontaneous no blood - Plan: MM Digital Diagnostic Bilat, US BREAST LTD UNI LEFT INC AXILLA  Breast symptom left - left sensitivity plan dgmammo and [place] - Plan: MM Digital Diagnostic Bilat, US BREAST LTD UNI LEFT INC AXILLA If continuing problem consider getting surgery to examine. -Patient advised to return or notify health care team  if symptoms worsen ,persist or new concerns arise.  Patient Instructions  We will order diagnostic mammogram   At the breast center and ultrasound    Sometimes medication can do this   If continuing sx   And negative imaging studies contact us  And we may do a surgery consult for exam.       Standley Brooking. Panosh M.D.

## 2013-11-17 NOTE — Patient Instructions (Signed)
We will order diagnostic mammogram   At the breast center and ultrasound    Sometimes medication can do this   If continuing sx   And negative imaging studies contact us  And we may do a surgery consult for exam.

## 2013-11-19 ENCOUNTER — Other Ambulatory Visit: Payer: Self-pay | Admitting: Internal Medicine

## 2013-11-20 ENCOUNTER — Other Ambulatory Visit: Payer: Self-pay

## 2013-11-24 ENCOUNTER — Other Ambulatory Visit: Payer: Self-pay | Admitting: Internal Medicine

## 2013-11-24 DIAGNOSIS — N6452 Nipple discharge: Secondary | ICD-10-CM

## 2013-11-24 DIAGNOSIS — N6459 Other signs and symptoms in breast: Secondary | ICD-10-CM

## 2013-12-02 ENCOUNTER — Other Ambulatory Visit: Payer: Self-pay | Admitting: Internal Medicine

## 2013-12-03 NOTE — Telephone Encounter (Signed)
When should the pt return?

## 2013-12-03 NOTE — Telephone Encounter (Signed)
Ok to refill  For 6 months  ROV jan or therabouts  Bmp and a1c pre visit

## 2013-12-04 NOTE — Telephone Encounter (Signed)
Sent to the pharmacy by e-scribe.  Pt has upcoming appts in Feb 2016.

## 2013-12-07 ENCOUNTER — Encounter: Payer: Self-pay | Admitting: Internal Medicine

## 2013-12-11 ENCOUNTER — Ambulatory Visit
Admission: RE | Admit: 2013-12-11 | Discharge: 2013-12-11 | Disposition: A | Discharge status: Home / Self Care | Payer: BC Managed Care – PPO | Source: Ambulatory Visit | Attending: Internal Medicine | Admitting: Internal Medicine

## 2013-12-11 ENCOUNTER — Ambulatory Visit (INDEPENDENT_AMBULATORY_CARE_PROVIDER_SITE_OTHER): Payer: BC Managed Care – PPO | Admitting: Internal Medicine

## 2013-12-11 ENCOUNTER — Encounter: Payer: Self-pay | Admitting: Internal Medicine

## 2013-12-11 VITALS — BP 118/68 | HR 68 | Ht 65.25 in | Wt 181.5 lb

## 2013-12-11 DIAGNOSIS — N6452 Nipple discharge: Secondary | ICD-10-CM

## 2013-12-11 DIAGNOSIS — N6459 Other signs and symptoms in breast: Secondary | ICD-10-CM

## 2013-12-11 DIAGNOSIS — K259 Gastric ulcer, unspecified as acute or chronic, without hemorrhage or perforation: Secondary | ICD-10-CM

## 2013-12-11 MED ORDER — OMEPRAZOLE 20 MG PO CPDR: DELAYED_RELEASE_CAPSULE | ORAL | Status: DC

## 2013-12-11 NOTE — Progress Notes (Signed)
Morgan [name] 1950/09/24 [id number]  Note: This dictation was prepared with Dragon digital system. Any transcriptional errors that result from this procedure are unintentional.   History of Present Illness: This is a 63 year old white female with history of gastric ulcer and gastric atypia and metaplasia on antral biopsies. She is due for repeat upper endoscopy which has already been scheduled for  December 2015   but she wanted to discuss it today .She denies any abdominal pain ,nausea, vomiting, weight loss. In April 2012 she was found to have a gastric ulcer which showed several atypia and intestinal metaplasia. 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 CT scan of the abdomen in August 2012 revealed  no acute findings. She is on Prilosec 20 mg daily and denies heartburn dysphagia bloating or change in bowel habits    Past Medical History  Diagnosis Date  . DIABETES MELLITUS, TYPE II 12/09/2009  . HYPERLIPIDEMIA-MIXED 04/14/2008  . OVERWEIGHT/OBESITY 05/13/2009  . CAD, NATIVE VESSEL 05/13/2009  . Occult blood in stools   . Anemia     as a child   . Hx of abnormal cervical Pap smear     cryo  but normal since then   . Ulcer     Duodenal ulcer by x-ray 40 years ago  . Osteopenia   . Obesity   . Myocardial infarction     2003  . Gastritis   . Gastric ulcer   . Intestinal metaplasia of gastric mucosa 2012    Past Surgical History  Procedure Laterality Date  . Tubal ligation      One Tube  . Dilation and curettage of uterus    . Fibula fracture surgery    . 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 Cesarean section      x2  . Tonsillectomy    . Ganglion cyst excision    . Colonoscopy    . Upper gastrointestinal endoscopy    . Ectopic pregnancy surgery      Allergies  Allergen Reactions  . Penicillins     REACTION:  as a child    Family history and social history have been reviewed.  Review of Systems: denies constipation diarrhea  The remainder of the 10 point ROS is negative except as outlined in the H&P  Physical Exam: General Appearance Well developed, in no distress Eyes  Non icteric  HEENT  Non traumatic, normocephalic  Mouth No lesion, tongue papillated, no cheilosis Neck Supple without adenopathy, thyroid not enlarged, no carotid bruits, no JVD Lungs Clear to auscultation bilaterally COR Normal S1, normal S2, regular rhythm, no murmur, quiet precordium Abdomen soft abdomen with normoactive bowel sounds. Liver edge at costal margin. No distention Rectal not done Extremities  No pedal edema Skin No lesions Neurological Alert and oriented x 3 Psychological Normal mood and affect  Assessment and Plan:   63 year old white female with history of gastric ulcer with the intestinal metaplasia , increased  risk for gastric cancer. She will have a recall upper endoscopy next month. She has already been scheduled for it. In the meantime we will refill omeprazole 20 mg daily  Colorectal screening. She is up-to-date on colonoscopy    Delfin Edis 12/11/2013

## 2013-12-11 NOTE — Patient Instructions (Addendum)
We have sent the following medications to your pharmacy for you to pick up at your convenience: Omeprazole  You have been scheduled for an endoscopy. Please follow written instructions given to you at your visit today. If you use inhalers (even only as needed), please bring them with you on the day of your procedure. Your physician has requested that you go to www.[email or web address] and enter the access code given to you at your visit today. This web site gives a general overview about your procedure. However, you should still follow specific instructions given to you by our office regarding your preparation for the procedure.  CC Dr Regis Bill

## 2013-12-12 ENCOUNTER — Encounter: Payer: Self-pay | Admitting: Internal Medicine

## 2013-12-21 ENCOUNTER — Encounter: Payer: Self-pay | Admitting: Vascular Surgery

## 2013-12-22 ENCOUNTER — Ambulatory Visit (HOSPITAL_COMMUNITY)
Admission: RE | Admit: 2013-12-22 | Discharge: 2013-12-22 | Disposition: A | Discharge status: Home / Self Care | Payer: BC Managed Care – PPO | Source: Ambulatory Visit | Attending: Vascular Surgery | Admitting: Vascular Surgery

## 2013-12-22 ENCOUNTER — Encounter: Payer: Self-pay | Admitting: Vascular Surgery

## 2013-12-22 ENCOUNTER — Encounter: Payer: Self-pay | Admitting: Internal Medicine

## 2013-12-22 ENCOUNTER — Ambulatory Visit (INDEPENDENT_AMBULATORY_CARE_PROVIDER_SITE_OTHER): Payer: BC Managed Care – PPO | Admitting: Vascular Surgery

## 2013-12-22 VITALS — BP 132/67 | HR 68 | Resp 16 | Ht 65.5 in | Wt 178.0 lb

## 2013-12-22 DIAGNOSIS — R0989 Other specified symptoms and signs involving the circulatory and respiratory systems: Secondary | ICD-10-CM | POA: Diagnosis present

## 2013-12-22 DIAGNOSIS — Z8249 Family history of ischemic heart disease and other diseases of the circulatory system: Secondary | ICD-10-CM | POA: Diagnosis not present

## 2013-12-22 DIAGNOSIS — I83891 Varicose veins of right lower extremities with other complications: Secondary | ICD-10-CM

## 2013-12-22 NOTE — Progress Notes (Signed)
Subjective:     Patient ID: Morgan [name], female   DOB: 21-Aug-1950, 63 y.o.   MRN: [id number]  HPI this 63 year old female  Returns for continued follow-up regarding her painful varicosities right worse than left. She also had a pulsatile aorta noted by Dr. Trula Slade 3 months ago and had a duplex scan performed today. Duplex scan was reviewed by me and reveals no evidence of abdominal aortic aneurysm. She continues to have painful varicosities. Her symptoms consist of aching burning and throbbing discomfort in that are not relieved by elastic compression stockings-long-leg 20-30 millimeter gradient. She is unable to elevate her legs at work and ibuprofen has been unsuccessful. This is beginning to affect her daily living and her ability to work.she has no history of DVT or thrombophlebitis stasis ulcers or bleeding. She does have swelling as the day progresses however.  Past Medical History  Diagnosis Date  . DIABETES MELLITUS, TYPE II 12/09/2009  . HYPERLIPIDEMIA-MIXED 04/14/2008  . OVERWEIGHT/OBESITY 05/13/2009  . CAD, NATIVE VESSEL 05/13/2009  . Occult blood in stools   . Anemia     as a child   . Hx of abnormal cervical Pap smear     cryo  but normal since then   . Ulcer     Duodenal ulcer by x-ray 40 years ago  . Osteopenia   . Obesity   . Myocardial infarction     2003  . Gastritis   . Gastric ulcer   . Intestinal metaplasia of gastric mucosa 2012    History  Substance Use Topics  . Smoking status: Former Smoker    Quit date: 02/05/1994  . Smokeless tobacco: Never Used  . Alcohol Use: No    Family History  Problem Relation Age of Onset  . Emphysema Mother     Smoker  . Heart attack Mother     due to head injury  . Alcohol abuse Mother   . Heart disease Mother   . Early death Mother   . Varicose Veins Mother   . AAA (abdominal aortic aneurysm) Mother   . Lung cancer Father   . Heart attack Father   . Hypertension Father   . Alcohol abuse Father   . Heart disease  Father     before age 2  . Aneurysm Father   . Hyperlipidemia Father   . Alcohol abuse Brother   . Diabetes Brother   . Heart disease Brother   . Heart attack Brother   . Diabetes Brother   . Aneurysm Paternal Grandmother   . Diabetes      grandfather  . Colon cancer Neg Hx   . Esophageal cancer Neg Hx   . Stomach cancer Neg Hx   . Sudden death Brother     scd    Allergies  Allergen Reactions  . Penicillins     REACTION: as a child    Current outpatient prescriptions: aspirin 81 MG tablet, Take 81 mg by mouth daily.  , Disp: , Rfl: ;  atorvastatin (LIPITOR) 80 MG tablet, Take 1 tablet (80 mg total) by mouth daily., Disp: 90 tablet, Rfl: 3;  B-D UF III MINI PEN NEEDLES 31G X 5 MM MISC, USE AS DIRECTED, Disp: 300 each, Rfl: 3;  BYETTA 10 MCG PEN 10 MCG/0.04ML SOPN injection, INJECT 10 UNITS SUBCUTANEOUSLY TWICE DAILY, Disp: 2.4 mL, Rfl: 1 clindamycin (CLEOCIN) 150 MG capsule, Take by mouth. 4 tabs prior to dental work , Disp: , Rfl: ;  INVOKANA 100 MG TABS  tablet, TAKE 1 TABLET BY MOUTH EVERY DAY, Disp: 30 tablet, Rfl: 5;  metFORMIN (GLUCOPHAGE-XR) 500 MG 24 hr tablet, TAKE 2 TABLETS BY MOUTH TWICE DAILY WITH MEALS, Disp: 360 tablet, Rfl: 1;  Multiple Vitamins-Minerals (MULTIVITAMIN PO), Take by mouth daily., Disp: , Rfl:  omeprazole (PRILOSEC) 20 MG capsule, TAKE ONE CAPSULE BY MOUTH EVERY DAY., Disp: 90 capsule, Rfl: 3;  ONE TOUCH ULTRA TEST test strip, USE AS DIRECTED TO TEST TWICE DAILY, Disp: 100 each, Rfl: 6;  ONETOUCH DELICA LANCETS 32N MISC, Use as directed, Disp: 100 each, Rfl: 10;  ZETIA 10 MG tablet, TAKE 1 TABLET BY MOUTH EVERY DAY, Disp: 90 tablet, Rfl: 1  BP 132/67 mmHg  Pulse 68  Resp 16  Ht 5' 5.5" (1.664 m)  Wt 178 lb (80.74 kg)  BMI 29.16 kg/m2  Body mass index is 29.16 kg/(m^2).           Review of Systems Denies chest [name], dyspnea on exertion, PND, orthopnea, hemoptysis.     Objective:   Physical Exam BP 132/67 mmHg  Pulse 68  Resp 16  Ht 5'  5.5" (1.664 m)  Wt 178 lb (80.74 kg)  BMI 29.16 kg/m2  Gen. Well-developed well-nourished female no apparent stress alert and oriented 3 Lungs no rhonchi or wheezing Right leg with 3+ femoral and dorsalis pedis pulse palpable. 1+ chronic edema. Large bulging varicosities beginning in the upper to mid third of the anterior thigh on the right extending medially down to the knee also into the right knee reveal and lateral calf with extensive network posteriorly in the thigh area as well. No hyperpigmentation or ulceration is noted.  I reviewed the duplex scan of the right leg which was performed in July 2015 and this reveals gross reflux in the right great saphenous vein supplying these painful bulging varicosities with no DVT and some deep vein reflux is noted.     Assessment:     Painful varicosities right leg which are resistant to conservative measures including long-leg elastic compression stockings, ibuprofen, and elevation. These are affecting her daily living and her ability to work. No evidence of abdominal aortic aneurysm on duplex scanning     Plan:     Patient needs #1 laser ablation right great saphenous vein plus greater than 20 stab phlebectomy of painful varicosities followed by 2 courses of sclerotherapy for the smaller painful varicosities in the distal right leg. Will proceed with recertification to perform this in the near future

## 2013-12-27 ENCOUNTER — Encounter: Payer: Self-pay | Admitting: Internal Medicine

## 2013-12-29 ENCOUNTER — Other Ambulatory Visit: Payer: Self-pay | Admitting: Family Medicine

## 2013-12-29 ENCOUNTER — Other Ambulatory Visit: Payer: Self-pay | Admitting: *Deleted

## 2013-12-29 DIAGNOSIS — I83891 Varicose veins of right lower extremities with other complications: Secondary | ICD-10-CM

## 2013-12-29 MED ORDER — SULFAMETHOXAZOLE-TRIMETHOPRIM 800-160 MG PO TABS: 1.0000 | ORAL_TABLET | Freq: Two times a day (BID) | ORAL | Status: DC

## 2014-01-08 ENCOUNTER — Encounter: Payer: Self-pay | Admitting: Internal Medicine

## 2014-01-08 ENCOUNTER — Ambulatory Visit (AMBULATORY_SURGERY_CENTER): Payer: BC Managed Care – PPO | Admitting: Internal Medicine

## 2014-01-08 VITALS — BP 127/66 | HR 61 | Temp 97.7°F | Resp 14 | Ht 65.25 in | Wt 181.0 lb

## 2014-01-08 DIAGNOSIS — K259 Gastric ulcer, unspecified as acute or chronic, without hemorrhage or perforation: Secondary | ICD-10-CM

## 2014-01-08 DIAGNOSIS — K208 Other esophagitis: Secondary | ICD-10-CM

## 2014-01-08 DIAGNOSIS — K2970 Gastritis, unspecified, without bleeding: Secondary | ICD-10-CM

## 2014-01-08 DIAGNOSIS — K2990 Gastroduodenitis, unspecified, without bleeding: Secondary | ICD-10-CM

## 2014-01-08 DIAGNOSIS — K2950 Unspecified chronic gastritis without bleeding: Secondary | ICD-10-CM

## 2014-01-08 LAB — GLUCOSE, CAPILLARY
Glucose-Capillary: 132 mg/dL — ABNORMAL HIGH (ref 70–99)
Glucose-Capillary: 131 mg/dL — ABNORMAL HIGH (ref 70–99)

## 2014-01-08 MED ORDER — SODIUM CHLORIDE 0.9 % IV SOLN: 500.0000 mL | INTRAVENOUS | Status: DC

## 2014-01-08 NOTE — Progress Notes (Signed)
Called to room to assist during endoscopic procedure.  Patient ID and intended procedure confirmed with present staff. Received instructions for my participation in the procedure from the performing physician.  

## 2014-01-08 NOTE — Progress Notes (Signed)
A/ox3 pleased with MAC, report to Celia RN 

## 2014-01-08 NOTE — Patient Instructions (Signed)
Discharge instructions given. Biopsies taken. Resume previous medications. YOU HAD AN ENDOSCOPIC PROCEDURE TODAY AT THE [facility]: Refer to the procedure report that was given to you for any specific questions about what was found during the examination.  If the procedure report does not answer your questions, please call your gastroenterologist to clarify.  If you requested that your care partner not be given the details of your procedure findings, then the procedure report has been included in a sealed envelope for you to review at your convenience later.  YOU SHOULD EXPECT: Some feelings of bloating in the abdomen. Passage of more gas than usual.  Walking can help get rid of the air that was put into your GI tract during the procedure and reduce the bloating. If you had a lower endoscopy (such as a colonoscopy or flexible sigmoidoscopy) you may notice spotting of blood in your stool or on the toilet paper. If you underwent a bowel prep for your procedure, then you may not have a normal bowel movement for a few days.  DIET: Your first meal following the procedure should be a light meal and then it is ok to progress to your normal diet.  A half-sandwich or bowl of soup is an example of a good first meal.  Heavy or fried foods are harder to digest and may make you feel nauseous or bloated.  Likewise meals heavy in dairy and vegetables can cause extra gas to form and this can also increase the bloating.  Drink plenty of fluids but you should avoid alcoholic beverages for 24 hours.  ACTIVITY: Your care partner should take you home directly after the procedure.  You should plan to take it easy, moving slowly for the rest of the day.  You can resume normal activity the day after the procedure however you should NOT DRIVE or use heavy machinery for 24 hours (because of the sedation medicines used during the test).    SYMPTOMS TO REPORT IMMEDIATELY: A gastroenterologist can be reached at any  hour.  During normal business hours, 8:30 AM to 5:00 PM Monday through Friday, call [phone].  After hours and on weekends, please call the GI answering service at [phone] who will take a message and have the physician on call contact you.   Following upper endoscopy (EGD)  Vomiting of blood or coffee ground material  New chest pain or pain under the shoulder blades  Painful or persistently difficult swallowing  New shortness of breath  Fever of 100F or higher  Black, tarry-looking stools  FOLLOW UP: If any biopsies were taken you will be contacted by phone or by letter within the next 1-3 weeks.  Call your gastroenterologist if you have not heard about the biopsies in 3 weeks.  Our staff will call the home number listed on your records the next business day following your procedure to check on you and address any questions or concerns that you may have at that time regarding the information given to you following your procedure. This is a courtesy call and so if there is no answer at the home number and we have not heard from you through the emergency physician on call, we will assume that you have returned to your regular daily activities without incident.  SIGNATURES/CONFIDENTIALITY: You and/or your care partner have signed paperwork which will be entered into your electronic medical record.  These signatures attest to the fact that that the information above on your After Visit Summary has   has been reviewed and is understood.  Full responsibility of the confidentiality of this discharge information lies with you and/or your care-partner. 

## 2014-01-11 ENCOUNTER — Telehealth: Payer: Self-pay

## 2014-01-11 NOTE — Telephone Encounter (Signed)
  Follow up Call-  Call back number 01/08/2014  Post procedure Call Back phone  # [phone]  Permission to leave phone message Yes     Patient questions:  Do you have a fever, pain , or abdominal swelling? No. Pain Score  0 *  Have you tolerated food without any problems? Yes.    Have you been able to return to your normal activities? Yes.    Do you have any questions about your discharge instructions: Diet   No. Medications  No. Follow up visit  No.  Do you have questions or concerns about your Care? No.  Actions: * If pain score is 4 or above: No action needed, pain <4.

## 2014-01-12 ENCOUNTER — Encounter: Payer: Self-pay | Admitting: Internal Medicine

## 2014-01-13 ENCOUNTER — Other Ambulatory Visit: Payer: Self-pay | Admitting: Internal Medicine

## 2014-01-13 NOTE — Telephone Encounter (Signed)
Sent to the pharmacy by e-scribe.  Pt has future follow up scheduled for Feb.

## 2014-01-13 NOTE — Op Note (Signed)
[facility]  [address] [place], [id number]   ENDOSCOPY PROCEDURE REPORT  PATIENT: Morgan, [name]  MR#: [id number] BIRTHDATE: July 25, 1950 , 55  yrs. old GENDER: female ENDOSCOPIST: Lafayette Dragon, MD REFERRED BY:  Burnis Medin, M.D. PROCEDURE DATE:  01/08/2014 PROCEDURE:  EGD w/ biopsy ASA CLASS:     Class II INDICATIONS:  EGD 05/2010 gastric ulcer with atypia, EGD 01/2011 healed Gastric ulser- biopsies showed metaplasia, this is a follow up EGD. MEDICATIONS: Monitored anesthesia care and Propofol 120 mg IV TOPICAL ANESTHETIC: none  DESCRIPTION OF PROCEDURE: After the risks benefits and alternatives of the procedure were thoroughly explained, informed consent was obtained.  The LB [id number] endoscope was introduced through the mouth and advanced to the second portion of the duodenum , Without limitations.  The instrument was slowly withdrawn as the mucosa was fully examined.      ESOPHAGUS: The mucosa of the esophagus appeared normal. Z-line was irregular.  Multiple biopsies were performed using cold forceps. Sample obtained to rule out Barrett's esophagus.  Retroflexed views revealed no abnormalities.     The scope was then withdrawn from the patient and the procedure completed. STOMACH: mucosa was  mildly erythematous in gastric antrum- biopsies obtained to r/o H.pylori,  DUODENUM:: normal duodenal bulb ans descending duodenum.  COMPLICATIONS: There were no immediate complications.  ENDOSCOPIC IMPRESSION: The mucosa of the esophagus appeared normal; multiple biopsies were performed Irregular z-line Bx's Minimal antral gastritis -Bx's  RECOMMENDATIONS: 1.  Await pathology results 2.  Continue PPI  REPEAT EXAM: for EGD pending biopsy results.  eSigned:  Lafayette Dragon, MD 01/11/2014 4:56 PM    CC: Photodocumentation not available today.

## 2014-01-22 ENCOUNTER — Encounter: Payer: Self-pay | Admitting: Vascular Surgery

## 2014-01-25 ENCOUNTER — Encounter: Payer: Self-pay | Admitting: Vascular Surgery

## 2014-01-25 ENCOUNTER — Ambulatory Visit (INDEPENDENT_AMBULATORY_CARE_PROVIDER_SITE_OTHER): Payer: BC Managed Care – PPO | Admitting: Vascular Surgery

## 2014-01-25 VITALS — BP 129/87 | HR 82 | Resp 16 | Ht 66.0 in | Wt 178.0 lb

## 2014-01-25 DIAGNOSIS — I83891 Varicose veins of right lower extremities with other complications: Secondary | ICD-10-CM

## 2014-01-25 DIAGNOSIS — I83892 Varicose veins of left lower extremities with other complications: Secondary | ICD-10-CM

## 2014-01-25 NOTE — Progress Notes (Deleted)
Subjective:     Patient ID: Morgan [name], female   DOB: May 27, 1950, 63 y.o.   MRN: [id number]  HPI this 63 year old female returns 1 week post laser ablation left great saphenous vein for gross reflux with painful  varicosities and swelling. She has had less discomfort on the left leg and she had on the right. She has a had a moderate amount bruising in the thigh area. She's had no distal edema noted. She has worn her elastic compression stockings and taken ibuprofen as instructed..  Past Medical History  Diagnosis Date  . DIABETES MELLITUS, TYPE II 12/09/2009  . HYPERLIPIDEMIA-MIXED 04/14/2008  . OVERWEIGHT/OBESITY 05/13/2009  . CAD, NATIVE VESSEL 05/13/2009  . Occult blood in stools   . Anemia     as a child   . Hx of abnormal cervical Pap smear     cryo  but normal since then   . Ulcer     Duodenal ulcer by x-ray 40 years ago  . Osteopenia   . Obesity   . Myocardial infarction     2003  . Gastritis   . Gastric ulcer   . Intestinal metaplasia of gastric mucosa 2012  . GERD (gastroesophageal reflux disease)     History  Substance Use Topics  . Smoking status: Former Smoker    Quit date: 02/05/1994  . Smokeless tobacco: Never Used  . Alcohol Use: No    Family History  Problem Relation Age of Onset  . Emphysema Mother     Smoker  . Heart attack Mother     due to head injury  . Alcohol abuse Mother   . Heart disease Mother   . Early death Mother   . Varicose Veins Mother   . AAA (abdominal aortic aneurysm) Mother   . Lung cancer Father   . Heart attack Father   . Hypertension Father   . Alcohol abuse Father   . Heart disease Father     before age 19  . Aneurysm Father   . Hyperlipidemia Father   . Alcohol abuse Brother   . Diabetes Brother   . Heart disease Brother   . Heart attack Brother   . Diabetes Brother   . Aneurysm Paternal Grandmother   . Diabetes      grandfather  . Colon cancer Neg Hx   . Esophageal cancer Neg Hx   . Stomach cancer Neg Hx   .  Sudden death Brother     scd    Allergies  Allergen Reactions  . Penicillins     REACTION: as a child    Current outpatient prescriptions: aspirin 81 MG tablet, Take 81 mg by mouth daily.  , Disp: , Rfl: ;  atorvastatin (LIPITOR) 80 MG tablet, Take 1 tablet (80 mg total) by mouth daily., Disp: 90 tablet, Rfl: 3;  B-D UF III MINI PEN NEEDLES 31G X 5 MM MISC, USE AS DIRECTED, Disp: 300 each, Rfl: 3;  BYETTA 10 MCG PEN 10 MCG/0.04ML SOPN injection, INJECT 10 UNITS SUBCUTANEOUSLY TWICE DAILY, Disp: 2.4 mL, Rfl: 5 clindamycin (CLEOCIN) 150 MG capsule, Take by mouth. 4 tabs prior to dental work , Disp: , Rfl: ;  INVOKANA 100 MG TABS tablet, TAKE 1 TABLET BY MOUTH EVERY DAY, Disp: 30 tablet, Rfl: 5;  LORazepam (ATIVAN) 1 MG tablet, , Disp: , Rfl: 0;  metFORMIN (GLUCOPHAGE-XR) 500 MG 24 hr tablet, TAKE 2 TABLETS BY MOUTH TWICE DAILY WITH MEALS, Disp: 360 tablet, Rfl: 1;  Multiple Vitamins-Minerals (MULTIVITAMIN  PO), Take by mouth daily., Disp: , Rfl:  omeprazole (PRILOSEC) 20 MG capsule, TAKE ONE CAPSULE BY MOUTH EVERY DAY., Disp: 90 capsule, Rfl: 3;  ONE TOUCH ULTRA TEST test strip, USE AS DIRECTED TO TEST TWICE DAILY, Disp: 100 each, Rfl: 6;  ONETOUCH DELICA LANCETS 46T MISC, Use as directed, Disp: 100 each, Rfl: 10;  ZETIA 10 MG tablet, TAKE 1 TABLET BY MOUTH EVERY DAY, Disp: 90 tablet, Rfl: 1  There were no vitals taken for this visit.  There is no weight on file to calculate BMI.         Review of Systems denies chest [name], dyspnea on exertion, hemoptysis, PND, orthopnea, claudication.     Objective:   Physical Exam There were no vitals taken for this visit.  Gen. well-developed well-nourished female in no apparent distress alert and oriented 3 Lungs no rhonchi or wheezing Left leg with moderate ecchymosis in mid to distal thigh over great saphenous vein. Mild tenderness to palpation. 1+ distal edema. 3 posterior cells pedis pulse palpable.  Today I ordered a venous duplex exam the  left leg which I reviewed and interpreted. There is no DVT. The left great saphenous vein is closed from the distal thigh to near the saphenofemoral junction.     Assessment:     Successful well-tolerated laser ablation bilateral great saphenous veins with multiple stab phlebectomy on the right    Plan:     Return to see me on when necessary basis

## 2014-01-25 NOTE — Addendum Note (Signed)
Addended by: Tinnie Gens D on: 01/25/2014 04:36 PM   Modules accepted: Level of Service

## 2014-01-25 NOTE — Progress Notes (Signed)
Subjective:     Patient ID: Morgan [name], female   DOB: September 12, 1950, 63 y.o.   MRN: [id number]  HPI this 63 year old female had laser ablation of the right great saphenous vein plus approximately 35 stab phlebectomy of painful varicosities all performed under local tumescent anesthesia. She tolerated the procedure well. A total of 800 J of energy was utilized.   Review of Systems     Objective:   Physical Exam BP 129/87 mmHg  Pulse 82  Resp 16  Ht 5\' 6"  (1.676 m)  Wt 178 lb (80.74 kg)  BMI 28.74 kg/m2       Assessment:     Well-tolerated laser ablation right great saphenous vein plus greater than 20 stab phlebectomy of painful varicosities performed under local tumescent anesthesia    Plan:     Return in one week for venous duplex exam to confirm closure right great saphenous vein from mid thigh to near the saphenofemoral junction

## 2014-01-25 NOTE — Progress Notes (Signed)
     Laser Ablation Procedure    Date: 01/25/2014   Morgan [name] DOB:Aug 31, 1950  Consent signed: Yes    Surgeon:  Dr.  Kellie Simmering  Procedure: Laser Ablation: right Greater Saphenous Vein  3:00-4:50 pm  BP 129/87 mmHg  Pulse 82  Resp 16  Ht 5\' 6"  (1.676 m)  Wt 178 lb (80.74 kg)  BMI 28.74 kg/m2  Tumescent Anesthesia: 500 cc 0.9% NaCl with 50 cc Lidocaine HCL with 1% Epi and 15 cc 8.4% NaHCO3  Local Anesthesia: 16 cc Lidocaine HCL and NaHCO3 (ratio 2:1)  Pulsed mode: 15 watts, 527ms delay, 1.0 duration Total energy: 853.27, total pulses: 57, total time: :57    Stab Phlebectomy: >20 Sites: Thigh and Calf  Patient tolerated procedure well  Notes:   Description of Procedure:  After marking the course of the secondary varicosities, the patient was placed on the operating table in the supine position, and the right leg was prepped and draped in sterile fashion.   Local anesthetic was administered and under ultrasound guidance the saphenous vein was accessed with a micro needle and guide wire; then the mirco puncture sheath was placed.  A guide wire was inserted saphenofemoral junction , followed by a 5 french sheath.  The position of the sheath and then the laser fiber below the junction was confirmed using the ultrasound.  Tumescent anesthesia was administered along the course of the saphenous vein using ultrasound guidance. The patient was placed in Trendelenburg position and protective laser glasses were placed on patient and staff, and the laser was fired at 15 watts continuous mode advancing 1-33mm/second for a total of 853.27 joules.   For stab phlebectomies, local anesthetic was administered at the previously marked varicosities, and tumescent anesthesia was administered around the vessels.  Greater than 20 stab wounds were made using the tip of an 11 blade. And using the vein hook, the phlebectomies were performed using a hemostat to avulse the varicosities.  Adequate  hemostasis was achieved.     Steri strips were applied to the stab wounds and ABD pads and thigh high compression stockings were applied.  Ace wrap bandages were applied over the phlebectomy sites and at the top of the saphenofemoral junction. Blood loss was less than 15 cc.  The patient ambulated out of the operating room having tolerated the procedure well.

## 2014-01-26 ENCOUNTER — Telehealth: Payer: Self-pay | Admitting: *Deleted

## 2014-01-26 NOTE — Telephone Encounter (Signed)
Pt doing well. No problems. No bleeding. Following all instructions.

## 2014-01-28 ENCOUNTER — Encounter: Payer: Self-pay | Admitting: Vascular Surgery

## 2014-01-30 ENCOUNTER — Other Ambulatory Visit: Payer: Self-pay | Admitting: Internal Medicine

## 2014-02-01 ENCOUNTER — Ambulatory Visit (INDEPENDENT_AMBULATORY_CARE_PROVIDER_SITE_OTHER): Payer: BC Managed Care – PPO | Admitting: Vascular Surgery

## 2014-02-01 ENCOUNTER — Encounter (HOSPITAL_COMMUNITY): Payer: BC Managed Care – PPO

## 2014-02-01 ENCOUNTER — Encounter: Payer: Self-pay | Admitting: Vascular Surgery

## 2014-02-01 ENCOUNTER — Ambulatory Visit: Payer: BC Managed Care – PPO | Admitting: Vascular Surgery

## 2014-02-01 ENCOUNTER — Ambulatory Visit (HOSPITAL_COMMUNITY)
Admission: RE | Admit: 2014-02-01 | Discharge: 2014-02-01 | Disposition: A | Discharge status: Home / Self Care | Payer: BC Managed Care – PPO | Source: Ambulatory Visit | Attending: Vascular Surgery | Admitting: Vascular Surgery

## 2014-02-01 VITALS — BP 145/66 | HR 62 | Resp 16 | Ht 66.0 in | Wt 180.0 lb

## 2014-02-01 DIAGNOSIS — I83891 Varicose veins of right lower extremities with other complications: Secondary | ICD-10-CM | POA: Insufficient documentation

## 2014-02-01 NOTE — Telephone Encounter (Signed)
Sent to the pharmacy by e-scribe. 

## 2014-02-01 NOTE — Progress Notes (Signed)
Subjective:     Patient ID: Morgan [name], female   DOB: 11-12-1950, 63 y.o.   MRN: [id number]  HPI this 63 year old female had laser ablation right great saphenous vein plus greater than 20 stab phlebectomy of painful varicosities 1 week ago. She returns for follow-up. She has had some mild discomfort in the proximal thigh near the inguinal crease where the ablation was performed. She has had no distal edema. She's had no [name] in her stab phlebectomy sites. She has no other complaints. She denies chest [name] dyspnea on exertion or hemoptysis.     Review of Systems     Objective:   Physical Exam BP 145/66 mmHg  Pulse 62  Resp 16  Ht 5\' 6"  (1.676 m)  Wt 180 lb (81.647 kg)  BMI 29.07 kg/m2  Gen. well-developed well-nourished female in no apparent distress alert and oriented 3 Right leg with moderate ecchymosis in mid to proximal thigh anteriorly. No hematoma palpable. Stab phlebectomy sites all healing satisfactorily with no evidence of infection. 2+ to Salas pedis pulse palpable.  Today I ordered a venous duplex exam of the right leg which I reviewed and interpreted. There is no DVT. There is total closure of the great saphenous vein up to about 2 cm from the saphenofemoral junction.     Assessment:     Successful laser ablation right great saphenous vein plus multiple stab phlebectomy of painful varicosities    Plan:     Patient to return for 2 courses of sclerotherapy which will conclude her treatment regimen

## 2014-02-23 NOTE — Progress Notes (Signed)
HPI: FU CAD. Previous PCI of her RCA following myocardial infarction in 2003. Echocardiogram in 2004 showed normal LV function. Nuclear study in May of 2011 showed an ejection fraction of 73%. 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 Abd ultrasound 11/15 showed no aneurysm. Since last seen, the patient denies any dyspnea on exertion, orthopnea, PND, pedal edema, palpitations, syncope or chest pain.  Current Outpatient Prescriptions  Medication Sig Dispense Refill  . aspirin 81 MG tablet Take 81 mg by mouth daily.      Marland Kitchen atorvastatin (LIPITOR) 80 MG tablet Take 1 tablet (80 mg total) by mouth daily. 90 tablet 3  . B-D UF III MINI PEN NEEDLES 31G X 5 MM MISC USE AS DIRECTED 300 each 1  . BYETTA 10 MCG PEN 10 MCG/0.04ML SOPN injection INJECT 10 UNITS SUBCUTANEOUSLY TWICE DAILY 2.4 mL 5  . clindamycin (CLEOCIN) 150 MG capsule Take by mouth. 4 tabs prior to dental work     . INVOKANA 100 MG TABS tablet TAKE 1 TABLET BY MOUTH EVERY DAY 30 tablet 5  . LORazepam (ATIVAN) 1 MG tablet   0  . metFORMIN (GLUCOPHAGE-XR) 500 MG 24 hr tablet TAKE 2 TABLETS BY MOUTH TWICE DAILY WITH MEALS 360 tablet 1  . Multiple Vitamins-Minerals (MULTIVITAMIN PO) Take by mouth daily.    Marland Kitchen omeprazole (PRILOSEC) 20 MG capsule TAKE ONE CAPSULE BY MOUTH EVERY DAY. 90 capsule 3  . ONE TOUCH ULTRA TEST test strip USE AS DIRECTED TO TEST TWICE DAILY 100 each 6  . ONETOUCH DELICA LANCETS 89Q MISC Use as directed 100 each 10  . ZETIA 10 MG tablet TAKE 1 TABLET BY MOUTH EVERY DAY 90 tablet 1   No current facility-administered medications for this visit.     Past Medical History  Diagnosis Date  . DIABETES MELLITUS, TYPE II 12/09/2009  . HYPERLIPIDEMIA-MIXED 04/14/2008  . OVERWEIGHT/OBESITY 05/13/2009  . CAD, NATIVE VESSEL 05/13/2009  . Occult blood in stools   . Anemia     as a child   . Hx of abnormal cervical Pap smear     cryo  but normal since then   . Ulcer     Duodenal ulcer by x-ray  40 years ago  . Osteopenia   . Obesity   . Myocardial infarction     2003  . Gastritis   . Gastric ulcer   . Intestinal metaplasia of gastric mucosa 2012  . GERD (gastroesophageal reflux disease)     Past Surgical History  Procedure Laterality Date  . Tubal ligation      One Tube  . Dilation and curettage of uterus    . Fibula fracture surgery    . 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 Cesarean section      x2  . Tonsillectomy    . Ganglion cyst excision    . Colonoscopy    . Upper gastrointestinal endoscopy    . Ectopic pregnancy surgery      History   Social History  . Marital Status: Married    Spouse Name: N/A    Number of Children: 2  . Years of Education: phd    Occupational History  . 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 Smoking status: Former Smoker    Quit date: 02/05/1994  . Smokeless tobacco: Never Used  . Alcohol Use:  No  . Drug Use: No  . Sexual Activity: Not on file   Other Topics Concern  . 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 PHD education fom [employer].    No firearms    NO reg exercise    Pets- Poodles 2    Sleep 7 hours     ROS: no fevers or chills, productive cough, hemoptysis, dysphasia, odynophagia, melena, hematochezia, dysuria, hematuria, rash, seizure activity, orthopnea, PND, pedal edema, claudication. Remaining systems are negative.  Physical Exam: Well-developed well-nourished in no acute distress.  Skin is warm and dry.  HEENT is normal.  Neck is supple.  Chest is clear to auscultation with normal expansion.  Cardiovascular exam is regular rate and rhythm.  Abdominal exam nontender or distended. No masses palpated. Extremities show no  edema. neuro grossly intact  ECG     This encounter was created in error - please disregard.

## 2014-02-24 ENCOUNTER — Telehealth: Payer: Self-pay | Admitting: Internal Medicine

## 2014-02-24 MED ORDER — LIRAGLUTIDE 18 MG/3ML ~~LOC~~ SOPN: 1.2000 mg | PEN_INJECTOR | Freq: Every day | SUBCUTANEOUS | Status: DC

## 2014-02-24 NOTE — Telephone Encounter (Signed)
Pt came by the office today.  While here I notified her that a prescription for Victoza has been sent to the pharmacy.  Sent in 1 pen.  She will call back for more if tolerating.

## 2014-02-24 NOTE — Telephone Encounter (Signed)
PA for Byetta was denied.  Patient must try and fail Victoza first.

## 2014-02-26 ENCOUNTER — Encounter: Payer: BC Managed Care – PPO | Admitting: Cardiology

## 2014-03-09 ENCOUNTER — Encounter: Payer: Self-pay | Admitting: Vascular Surgery

## 2014-03-10 ENCOUNTER — Ambulatory Visit (INDEPENDENT_AMBULATORY_CARE_PROVIDER_SITE_OTHER): Payer: BLUE CROSS/BLUE SHIELD | Admitting: *Deleted

## 2014-03-10 DIAGNOSIS — I83891 Varicose veins of right lower extremities with other complications: Secondary | ICD-10-CM

## 2014-03-10 DIAGNOSIS — I83899 Varicose veins of unspecified lower extremities with other complications: Secondary | ICD-10-CM

## 2014-03-10 NOTE — Progress Notes (Signed)
X=.3% Sotradecol administered with a 27g butterfly.  Patient received a total of 18cc. Treated all ares of concern. She was very nervous but it went well. Will follow prn. Photos: No.  Compression stockings applied: Yes.

## 2014-03-11 ENCOUNTER — Encounter: Payer: Self-pay | Admitting: Vascular Surgery

## 2014-03-19 ENCOUNTER — Other Ambulatory Visit (INDEPENDENT_AMBULATORY_CARE_PROVIDER_SITE_OTHER): Payer: BLUE CROSS/BLUE SHIELD

## 2014-03-19 DIAGNOSIS — R7309 Other abnormal glucose: Secondary | ICD-10-CM

## 2014-03-19 DIAGNOSIS — R739 Hyperglycemia, unspecified: Secondary | ICD-10-CM

## 2014-03-19 DIAGNOSIS — E119 Type 2 diabetes mellitus without complications: Secondary | ICD-10-CM | POA: Diagnosis not present

## 2014-03-19 LAB — HEMOGLOBIN A1C: Hgb A1c MFr Bld: 7.2 % — ABNORMAL HIGH (ref 4.6–6.5)

## 2014-03-19 LAB — BASIC METABOLIC PANEL
BUN: 10 mg/dL (ref 6–23)
CALCIUM: 9.6 mg/dL (ref 8.4–10.5)
CO2: 27 mEq/L (ref 19–32)
Chloride: 101 mEq/L (ref 96–112)
Creatinine, Ser: 0.72 mg/dL (ref 0.40–1.20)
GFR: 86.96 mL/min (ref >60.00)
Glucose, Bld: 116 mg/dL — ABNORMAL HIGH (ref 70–99)
Potassium: 4.6 mEq/L (ref 3.5–5.1)
Sodium: 138 mEq/L (ref 135–145)

## 2014-03-23 ENCOUNTER — Other Ambulatory Visit: Payer: Self-pay | Admitting: Internal Medicine

## 2014-03-24 ENCOUNTER — Telehealth: Payer: Self-pay | Admitting: Internal Medicine

## 2014-03-24 MED ORDER — LIRAGLUTIDE 18 MG/3ML ~~LOC~~ SOPN: 1.2000 mg | PEN_INJECTOR | Freq: Every day | SUBCUTANEOUS | Status: DC

## 2014-03-24 NOTE — Telephone Encounter (Signed)
Need directions for Victoza

## 2014-03-24 NOTE — Telephone Encounter (Signed)
Sent to the pharmacy by e-scribe.  Pt has upcoming appt on 03/26/14

## 2014-03-24 NOTE — Telephone Encounter (Signed)
Ok to refill the liraglutide  ( It is on the medication list  med module  Refill for 6 monhts  1.2  dont know  why the  Med list did not uptdate ( if need to do ticket to IT?)  S(e phone notes about her insurance denial of byetta  On 1 70 )

## 2014-03-24 NOTE — Telephone Encounter (Signed)
Pharm called to refill pt's rx Victoza  2 pens/ 18 ml per 45ml inj pen 30 day  (do not see on pt's med list, but pharm states dr Regis Bill filled this on Feb 24, 2014)

## 2014-03-24 NOTE — Telephone Encounter (Signed)
Sent to the pharmacy by e-scribe. 

## 2014-03-26 ENCOUNTER — Ambulatory Visit (INDEPENDENT_AMBULATORY_CARE_PROVIDER_SITE_OTHER): Payer: BLUE CROSS/BLUE SHIELD | Admitting: Internal Medicine

## 2014-03-26 ENCOUNTER — Encounter: Payer: Self-pay | Admitting: Internal Medicine

## 2014-03-26 VITALS — BP 132/70 | Temp 97.6°F | Ht 66.0 in | Wt 184.6 lb

## 2014-03-26 DIAGNOSIS — R030 Elevated blood-pressure reading, without diagnosis of hypertension: Secondary | ICD-10-CM

## 2014-03-26 DIAGNOSIS — E119 Type 2 diabetes mellitus without complications: Secondary | ICD-10-CM

## 2014-03-26 DIAGNOSIS — I2510 Atherosclerotic heart disease of native coronary artery without angina pectoris: Secondary | ICD-10-CM

## 2014-03-26 DIAGNOSIS — Z79899 Other long term (current) drug therapy: Secondary | ICD-10-CM

## 2014-03-26 NOTE — Patient Instructions (Signed)
Continue the victoza .Marland Kitchen  And glad feel better .  Plan : Wellness visit  friday am 8 -815 ok cpx labs ahead with cpx and hga1c and microalbumin ratio urine   Diabetes and Exercise Exercising regularly is important. It is not just about losing weight. It has many health benefits, such as:  Improving your overall fitness, flexibility, and endurance.  Increasing your bone density.  Helping with weight control.  Decreasing your body fat.  Increasing your muscle strength.  Reducing stress and tension.  Improving your overall health. People with diabetes who exercise gain additional benefits because exercise:  Reduces appetite.  Improves the body's use of blood sugar (glucose).  Helps lower or control blood glucose.  Decreases blood pressure.  Helps control blood lipids (such as cholesterol and triglycerides).  Improves the body's use of the hormone insulin by:  Increasing the body's insulin sensitivity.  Reducing the body's insulin needs.  Decreases the risk for heart disease because exercising:  Lowers cholesterol and triglycerides levels.  Increases the levels of good cholesterol (such as high-density lipoproteins [HDL]) in the body.  Lowers blood glucose levels. YOUR ACTIVITY PLAN  Choose an activity that you enjoy and set realistic goals. Your health care provider or diabetes educator can help you make an activity plan that works for you. Exercise regularly as directed by your health care provider. This includes:  Performing resistance training twice a week such as push-ups, sit-ups, lifting weights, or using resistance bands.  Performing 150 minutes of cardio exercises each week such as walking, running, or playing sports.  Staying active and spending no more than 90 minutes at one time being inactive. Even short bursts of exercise are good for you. Three 10-minute sessions spread throughout the day are just as beneficial as a single 30-minute session. Some  exercise ideas include:  Taking the dog for a walk.  Taking the stairs instead of the elevator.  Dancing to your favorite song.  Doing an exercise video.  Doing your favorite exercise with a friend. RECOMMENDATIONS FOR EXERCISING WITH TYPE 1 OR TYPE 2 DIABETES   Check your blood glucose before exercising. If blood glucose levels are greater than 240 mg/dL, check for urine ketones. Do not exercise if ketones are present.  Avoid injecting insulin into areas of the body that are going to be exercised. For example, avoid injecting insulin into:  The arms when playing tennis.  The legs when jogging.  Keep a record of:  Food intake before and after you exercise.  Expected peak times of insulin action.  Blood glucose levels before and after you exercise.  The type and amount of exercise you have done.  Review your records with your health care provider. Your health care provider will help you to develop guidelines for adjusting food intake and insulin amounts before and after exercising.  If you take insulin or oral hypoglycemic agents, watch for signs and symptoms of hypoglycemia. They include:  Dizziness.  Shaking.  Sweating.  Chills.  Confusion.  Drink plenty of water while you exercise to prevent dehydration or heat stroke. Body water is lost during exercise and must be replaced.  Talk to your health care provider before starting an exercise program to make sure it is safe for you. Remember, almost any type of activity is better than none. Document Released: 04/14/2003 Document Revised: 06/08/2013 Document Reviewed: 07/01/2012 [facility] Patient Information 2015 [place], [place]. This information is not intended to replace advice given to you by your health care provider.  Make sure you discuss any questions you have with your health care provider.

## 2014-03-26 NOTE — Progress Notes (Signed)
Chief Complaint  Patient presents with  . Follow-up    HPI: Morgan [name]  comes in today for follow up of  multiple medical problems.  But mostly DM after  Transition  victoza  From byetta  and now on  1.2   Readings are" so much better  " About 3 weeks  Around 100 to 130 fasting  Previous 170 and above.     No lows .  Feels so much better since changing and uti rx with bactrim No sig se  Does get dry mouth ROS: See pertinent positives and negatives per HPI.  Had vein rx vv Dr Kellie Simmering doing well .   No cp sob sees dr Stanford Breed  Soon and gyne ( overdue) hasnt taken up exercise as planned yet   Has plans  No neuropathy sx vision change   Past Medical History  Diagnosis Date  . DIABETES MELLITUS, TYPE II 12/09/2009  . HYPERLIPIDEMIA-MIXED 04/14/2008  . OVERWEIGHT/OBESITY 05/13/2009  . CAD, NATIVE VESSEL 05/13/2009  . Occult blood in stools   . Anemia     as a child   . Hx of abnormal cervical Pap smear     cryo  but normal since then   . Ulcer     Duodenal ulcer by x-ray 40 years ago  . Osteopenia   . Obesity   . Myocardial infarction     2003  . Gastritis   . Gastric ulcer   . Intestinal metaplasia of gastric mucosa 2012  . GERD (gastroesophageal reflux disease)   . Varicose veins     under rx lawson 2016    Family History  Problem Relation Age of Onset  . Emphysema Mother     Smoker  . Heart attack Mother     due to head injury  . Alcohol abuse Mother   . Heart disease Mother   . Early death Mother   . Varicose Veins Mother   . AAA (abdominal aortic aneurysm) Mother   . Lung cancer Father   . Heart attack Father   . Hypertension Father   . Alcohol abuse Father   . Heart disease Father     before age 43  . Aneurysm Father   . Hyperlipidemia Father   . Alcohol abuse Brother   . Diabetes Brother   . Heart disease Brother   . Heart attack Brother   . Diabetes Brother   . Aneurysm Paternal Grandmother   . Diabetes      grandfather  . Colon cancer Neg Hx     . Esophageal cancer Neg Hx   . Stomach cancer Neg Hx   . Sudden death Brother     scd    History   Social History  . Marital Status: Married    Spouse Name: N/A  . Number of Children: 2  . Years of Education: phd    Occupational History  . 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 Smoking status: Former Smoker    Quit date: 02/05/1994  . Smokeless tobacco: Never Used  . Alcohol Use: No  . Drug Use: No  . Sexual Activity: Not on file   Other Topics Concern  . 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    No firearms    NO reg exercise    Pets- Poodles 2    Sleep 7 hours     Outpatient Encounter Prescriptions as of 03/26/2014  Medication Sig  . aspirin 81 MG tablet Take 81 mg by mouth daily.    Marland Kitchen atorvastatin (LIPITOR) 80 MG tablet Take 1 tablet (80 mg total) by mouth daily.  . B-D UF III MINI PEN NEEDLES 31G X 5 MM MISC USE AS DIRECTED  . clindamycin (CLEOCIN) 150 MG capsule Take by mouth. 4 tabs prior to dental work   . INVOKANA 100 MG TABS tablet TAKE 1 TABLET BY MOUTH EVERY DAY  . Liraglutide 18 MG/3ML SOPN Inject 0.2 mLs (1.2 mg total) into the skin daily.  Marland Kitchen LORazepam (ATIVAN) 1 MG tablet   . metFORMIN (GLUCOPHAGE-XR) 500 MG 24 hr tablet TAKE 2 TABLETS BY MOUTH TWICE DAILY WITH MEALS.  . Multiple Vitamins-Minerals (MULTIVITAMIN PO) Take by mouth daily.  Marland Kitchen omeprazole (PRILOSEC) 20 MG capsule TAKE ONE CAPSULE BY MOUTH EVERY DAY.  . ONE TOUCH ULTRA TEST test strip USE AS DIRECTED TO TEST TWICE DAILY  . ONETOUCH DELICA LANCETS 16X MISC Use as directed  . ZETIA 10 MG tablet TAKE 1 TABLET BY MOUTH EVERY DAY  . [DISCONTINUED] BYETTA 10 MCG PEN 10 MCG/0.04ML SOPN injection INJECT 10 UNITS SUBCUTANEOUSLY TWICE  DAILY    EXAM:  BP 132/70 mmHg  Temp(Src) 97.6 F (36.4 C) (Oral)  Ht 5\' 6"  (1.676 m)  Wt 184 lb 9.6 oz (83.734 kg)  BMI 29.81 kg/m2  Body mass index is 29.81 kg/(m^2).  GENERAL: vitals reviewed and listed above, alert, oriented, appears well hydrated and in no acute distress HEENT: atraumatic, conjunctiva  clear, no obvious abnormalities on inspection of external nose and ears NECK: no obvious masses on inspection palpation  LUNGS: clear to auscultation bilaterally, no wheezes, rales or rhonchi, good air movement Abdomen:  Sof,t normal bowel sounds without hepatosplenomegaly, no guarding rebound or masses no CVA tenderness CV: HRRR, no clubbing cyanosis or  peripheral edema nl cap refill   VV no ulcers  Diabetes foot exam : performed  Nl inspection pulses  Monofilament bilaterally MS: moves all extremities without noticeable focal  abnormality PSYCH: pleasant and cooperative, no obvious depression or anxiety Lab Results  Component Value Date   WBC 5.3 06/20/2012   HGB 13.5 06/20/2012   HCT 38.8 06/20/2012   PLT 193.0 06/20/2012   GLUCOSE 116* 03/19/2014   CHOL 132 03/20/2013   TRIG 64.0 03/20/2013   HDL 42.60 03/20/2013   LDLCALC 77 03/20/2013   ALT 27 03/20/2013   AST 24 03/20/2013   NA 138 03/19/2014   K 4.6 03/19/2014   CL 101 03/19/2014   CREATININE 0.72 03/19/2014   BUN 10 03/19/2014   CO2 27 03/19/2014   TSH 2.31 06/20/2012   HGBA1C 7.2* 03/19/2014   MICROALBUR 0.6 06/20/2012   Wt Readings from Last 3 Encounters:  03/26/14 184 lb 9.6 oz (83.734 kg)  02/01/14 180 lb (81.647 kg)  01/25/14 178 lb (80.74 kg)   BP Readings from Last 3 Encounters:  03/26/14 132/70  02/01/14 145/66  01/25/14 129/87     ASSESSMENT AND PLAN:  Discussed the following assessment and plan:  Diabetes mellitus without complication - transition  to victoza bg much  better  not yet reflected in a1c clincally better plan 6 months wellness and labs   Medication management bp now  normal  Reports at goal at home . Keep appt card and gyne  welleness visit with full labs a1c and urin microalbumin in about 6 months or as needed  -Patient advised to return or notify health care team  if symptoms worsen ,persist or new concerns arise.  Patient Instructions  Continue the victoza .Marland Kitchen  And glad feel better .  Plan : Wellness visit  friday am 8 -815 ok cpx labs ahead with cpx and hga1c and microalbumin ratio urine   Diabetes and Exercise Exercising regularly is important. It is not just about losing weight. It has many health benefits, such as:  Improving your overall fitness, flexibility, and endurance.  Increasing your bone density.  Helping with weight control.  Decreasing your body fat.  Increasing your muscle strength.  Reducing stress and tension.  Improving your overall health. People with diabetes who exercise gain additional benefits because exercise:  Reduces appetite.  Improves the body's use of blood sugar (glucose).  Helps lower or control blood glucose.  Decreases blood pressure.  Helps control blood lipids (such as cholesterol and triglycerides).  Improves the body's use of the hormone insulin by:  Increasing the body's insulin sensitivity.  Reducing the body's insulin needs.  Decreases the risk for heart disease because exercising:  Lowers cholesterol and triglycerides levels.  Increases the levels of good cholesterol (such as high-density lipoproteins [HDL]) in the body.  Lowers blood glucose levels. YOUR ACTIVITY PLAN  Choose an activity that you enjoy and set realistic goals. Your health care provider or diabetes educator can help you make an activity plan that works for you. Exercise regularly as directed by your health care provider. This includes:  Performing resistance training twice a week such as push-ups, sit-ups, lifting weights, or using resistance bands.  Performing 150 minutes of cardio exercises each week such as  walking, running, or playing sports.  Staying active and spending no more than 90 minutes at one time being inactive. Even short bursts of exercise are good for you. Three 10-minute sessions spread throughout the day are just as beneficial as a single 30-minute session. Some exercise ideas include:  Taking the dog for a walk.  Taking the stairs instead of the elevator.  Dancing to your favorite song.  Doing an exercise video.  Doing your favorite exercise with a friend. RECOMMENDATIONS FOR EXERCISING WITH TYPE 1 OR TYPE 2 DIABETES   Check your blood glucose before exercising. If blood glucose levels are greater than 240 mg/dL, check for urine ketones. Do not exercise if ketones are present.  Avoid injecting insulin into areas of the body that are going to be exercised. For example, avoid injecting insulin into:  The arms when playing tennis.  The legs when jogging.  Keep a record of:  Food intake before and after you exercise.  Expected peak times of insulin action.  Blood glucose levels before and after you exercise.  The type and amount of exercise you have done.  Review your records with your health care provider. Your health care provider will help you to develop guidelines for adjusting food intake and insulin amounts before and after exercising.  If you take insulin or oral hypoglycemic agents, watch for signs and symptoms of hypoglycemia. They include:  Dizziness.  Shaking.  Sweating.  Chills.  Confusion.  Drink plenty of water while you exercise to prevent  dehydration or heat stroke. Body water is lost during exercise and must be replaced.  Talk to your health care provider before starting an exercise program to make sure it is safe for you. Remember, almost any type of activity is better than none. Document Released: 04/14/2003 Document Revised: 06/08/2013 Document Reviewed: 07/01/2012 [facility] Patient Information 2015 [place], [place]. This information is  not intended to replace advice given to you by your health care provider. Make sure you discuss any questions you have with your health care provider.      Standley Brooking. Panosh M.D.

## 2014-03-26 NOTE — Progress Notes (Signed)
Pre visit review using our clinic review tool, if applicable. No additional management support is needed unless otherwise documented below in the visit note. 

## 2014-04-02 ENCOUNTER — Other Ambulatory Visit: Payer: Self-pay | Admitting: Cardiology

## 2014-04-20 ENCOUNTER — Encounter: Payer: Self-pay | Admitting: *Deleted

## 2014-04-21 ENCOUNTER — Ambulatory Visit (INDEPENDENT_AMBULATORY_CARE_PROVIDER_SITE_OTHER): Payer: BLUE CROSS/BLUE SHIELD | Admitting: *Deleted

## 2014-04-21 DIAGNOSIS — I83893 Varicose veins of bilateral lower extremities with other complications: Secondary | ICD-10-CM

## 2014-04-21 DIAGNOSIS — I83899 Varicose veins of unspecified lower extremities with other complications: Secondary | ICD-10-CM

## 2014-04-21 NOTE — Progress Notes (Signed)
X=.3% Sotradecol administered with a 27g butterfly.  Patient received a total of 12cc.  Treated any remaining spiders and reticulars of concern. Most areas from previous tx are resolving slowly. It will take time but I anticipate good results. Easy access. Tol well. This is her last ins covered sclero tx.  Photos: No.  Compression stockings applied: Yes.

## 2014-04-22 ENCOUNTER — Encounter: Payer: Self-pay | Admitting: *Deleted

## 2014-04-28 ENCOUNTER — Other Ambulatory Visit: Payer: Self-pay | Admitting: Internal Medicine

## 2014-04-28 ENCOUNTER — Ambulatory Visit: Payer: Self-pay | Admitting: *Deleted

## 2014-04-28 NOTE — Telephone Encounter (Signed)
Sent to the pharmacy by e-scribe. 

## 2014-05-17 ENCOUNTER — Other Ambulatory Visit: Payer: Self-pay | Admitting: Family Medicine

## 2014-05-17 ENCOUNTER — Other Ambulatory Visit (INDEPENDENT_AMBULATORY_CARE_PROVIDER_SITE_OTHER): Payer: BLUE CROSS/BLUE SHIELD

## 2014-05-17 ENCOUNTER — Encounter: Payer: Self-pay | Admitting: Internal Medicine

## 2014-05-17 DIAGNOSIS — M549 Dorsalgia, unspecified: Secondary | ICD-10-CM

## 2014-05-17 DIAGNOSIS — R300 Dysuria: Secondary | ICD-10-CM | POA: Diagnosis not present

## 2014-05-17 LAB — POCT URINALYSIS DIPSTICK
Bilirubin, UA: NEGATIVE
KETONES UA: NEGATIVE
Nitrite, UA: NEGATIVE
PH UA: 5
PROTEIN UA: NEGATIVE
UROBILINOGEN UA: 0.2

## 2014-05-17 MED ORDER — NITROFURANTOIN MONOHYD MACRO 100 MG PO CAPS: 100.0000 mg | ORAL_CAPSULE | Freq: Two times a day (BID) | ORAL | Status: DC

## 2014-05-20 ENCOUNTER — Other Ambulatory Visit: Payer: Self-pay | Admitting: Family Medicine

## 2014-05-20 LAB — URINE CULTURE

## 2014-05-20 MED ORDER — SULFAMETHOXAZOLE-TRIMETHOPRIM 800-160 MG PO TABS: 1.0000 | ORAL_TABLET | Freq: Two times a day (BID) | ORAL | Status: DC

## 2014-05-28 ENCOUNTER — Ambulatory Visit: Payer: Self-pay | Admitting: Cardiology

## 2014-06-18 ENCOUNTER — Other Ambulatory Visit: Payer: Self-pay | Admitting: Internal Medicine

## 2014-06-29 ENCOUNTER — Other Ambulatory Visit: Payer: Self-pay | Admitting: Cardiology

## 2014-06-30 NOTE — Telephone Encounter (Signed)
Rx(s) sent to pharmacy electronically.  

## 2014-07-03 ENCOUNTER — Other Ambulatory Visit: Payer: Self-pay | Admitting: Internal Medicine

## 2014-07-06 NOTE — Telephone Encounter (Signed)
Sent to the pharmacy by e-scribe for 3 months. Pt has upcoming appt in August.

## 2014-07-19 ENCOUNTER — Telehealth: Payer: Self-pay | Admitting: Internal Medicine

## 2014-07-19 NOTE — Telephone Encounter (Signed)
Pt request refill of the following:   VICTOZA    Phamacy:  [facility] [address]

## 2014-07-20 MED ORDER — LIRAGLUTIDE 18 MG/3ML ~~LOC~~ SOPN: 1.2000 mg | PEN_INJECTOR | Freq: Every day | SUBCUTANEOUS | Status: DC

## 2014-07-20 NOTE — Telephone Encounter (Signed)
Sent to the pharmacy by e-scribe. 

## 2014-07-30 ENCOUNTER — Other Ambulatory Visit: Payer: Self-pay | Admitting: Obstetrics and Gynecology

## 2014-08-02 LAB — CYTOLOGY - PAP

## 2014-09-03 NOTE — Progress Notes (Signed)
HPI: FU CAD. Previous PCI of her RCA following myocardial infarction in 2003. Echocardiogram in 2004 showed normal LV function. Nuclear study in May of 2011 showed an ejection fraction of 73%. 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 Abd ultrasound 11/15 showed no aneurysm. Seen last seen, the patient has dyspnea with more extreme activities but not with routine activities. It is relieved with rest. It is not associated with chest pain. There is no orthopnea, PND or pedal edema. There is no syncope or palpitations. There is no exertional chest pain.   Current Outpatient Prescriptions  Medication Sig Dispense Refill  . aspirin 81 MG tablet Take 81 mg by mouth daily.      Marland Kitchen atorvastatin (LIPITOR) 80 MG tablet Take 1 tablet (80 mg total) by mouth daily. 90 tablet 1  . B-D UF III MINI PEN NEEDLES 31G X 5 MM MISC USE AS DIRECTED 300 each 1  . clindamycin (CLEOCIN) 150 MG capsule Take by mouth. 4 tabs prior to dental work     . INVOKANA 100 MG TABS tablet TAKE 1 TABLET BY MOUTH EVERY DAY 30 tablet 2  . Liraglutide 18 MG/3ML SOPN Inject 0.2 mLs (1.2 mg total) into the skin daily. 3 mL 3  . LORazepam (ATIVAN) 1 MG tablet   0  . metFORMIN (GLUCOPHAGE-XR) 500 MG 24 hr tablet TAKE 2 TABLETS BY MOUTH TWICE DAILY WITH MEALS 360 tablet 1  . Multiple Vitamins-Minerals (MULTIVITAMIN PO) Take by mouth daily.    Marland Kitchen omeprazole (PRILOSEC) 20 MG capsule TAKE ONE CAPSULE BY MOUTH EVERY DAY. 90 capsule 3  . ONE TOUCH ULTRA TEST test strip USE AS DIRECTED TO TEST TWICE DAILY 100 each 6  . ONETOUCH DELICA LANCETS 08Q MISC Use as directed 100 each 10  . ZETIA 10 MG tablet TAKE 1 TABLET BY MOUTH EVERY DAY 90 tablet 1   No current facility-administered medications for this visit.     Past Medical History  Diagnosis Date  . DIABETES MELLITUS, TYPE II 12/09/2009  . HYPERLIPIDEMIA-MIXED 04/14/2008  . OVERWEIGHT/OBESITY 05/13/2009  . CAD, NATIVE VESSEL 05/13/2009  . Occult blood in stools    . Anemia     as a child   . Hx of abnormal cervical Pap smear     cryo  but normal since then   . Ulcer     Duodenal ulcer by x-ray 40 years ago  . Osteopenia   . Obesity   . Myocardial infarction     2003  . Gastritis   . Gastric ulcer   . Intestinal metaplasia of gastric mucosa 2012  . GERD (gastroesophageal reflux disease)   . Varicose veins     under rx lawson 2016    Past Surgical History  Procedure Laterality Date  . Tubal ligation      One Tube  . Dilation and curettage of uterus    . Fibula fracture surgery    . 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 Cesarean section      x2  . Tonsillectomy    . Ganglion cyst excision    . Colonoscopy    . Upper gastrointestinal endoscopy    . Ectopic pregnancy surgery      History   Social History  . Marital Status: Married    Spouse Name: N/A  . Number of Children: 2  . Years of Education: phd    Occupational History  .  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 Smoking status: Former Smoker    Quit date: 02/05/1994  . Smokeless tobacco: Never Used  . Alcohol Use: No  . Drug Use: No  . Sexual Activity: Not on file   Other Topics Concern  . 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 PHD education fom [place].    No firearms    NO reg exercise    Pets- Poodles 2    Sleep 7 hours     ROS: no fevers or chills, productive cough, hemoptysis, dysphasia, odynophagia, melena, hematochezia, dysuria, hematuria, rash, seizure activity, orthopnea, PND, pedal edema, claudication. Remaining systems are negative.  Physical Exam: Well-developed well-nourished in no acute distress.  Skin is warm and dry.  HEENT is normal.  Neck is supple.  Chest  is clear to auscultation with normal expansion.  Cardiovascular exam is regular rate and rhythm.  Abdominal exam nontender or distended. No masses palpated. Extremities show no edema. neuro grossly intact  ECG sinus rhythm with first-degree AV block. Normal axis. No ST changes.

## 2014-09-06 ENCOUNTER — Encounter: Payer: Self-pay | Admitting: Cardiology

## 2014-09-06 ENCOUNTER — Encounter: Payer: Self-pay | Admitting: *Deleted

## 2014-09-06 ENCOUNTER — Ambulatory Visit (INDEPENDENT_AMBULATORY_CARE_PROVIDER_SITE_OTHER): Payer: BLUE CROSS/BLUE SHIELD | Admitting: Cardiology

## 2014-09-06 VITALS — BP 118/76 | HR 68 | Ht 66.0 in | Wt 192.1 lb

## 2014-09-06 DIAGNOSIS — I2510 Atherosclerotic heart disease of native coronary artery without angina pectoris: Secondary | ICD-10-CM | POA: Diagnosis not present

## 2014-09-06 NOTE — Patient Instructions (Signed)
Your physician wants you to follow-up in: [place] will receive a reminder letter in the mail two months in advance. If you don't receive a letter, please call our office to schedule the follow-up appointment.   Your physician has requested that you have en exercise stress myoview. For further information please visit [email or web address]. Please follow instruction sheet, as given.   Your physician recommends that you HAVE LAB WORK SAME DAY AS STRESS TEST

## 2014-09-06 NOTE — Assessment & Plan Note (Signed)
Continue aspirin and statin. Schedule nuclear study for risk stratification. 

## 2014-09-06 NOTE — Assessment & Plan Note (Signed)
Continue statin. Check lipids and liver. 

## 2014-09-06 NOTE — Assessment & Plan Note (Signed)
We discussed weight loss. She is planning on implementing an exercise program.

## 2014-09-24 ENCOUNTER — Ambulatory Visit (INDEPENDENT_AMBULATORY_CARE_PROVIDER_SITE_OTHER): Payer: BLUE CROSS/BLUE SHIELD | Admitting: Internal Medicine

## 2014-09-24 ENCOUNTER — Encounter: Payer: Self-pay | Admitting: Internal Medicine

## 2014-09-24 VITALS — BP 128/74 | Temp 98.5°F | Ht 65.0 in | Wt 189.0 lb

## 2014-09-24 DIAGNOSIS — Z79899 Other long term (current) drug therapy: Secondary | ICD-10-CM | POA: Diagnosis not present

## 2014-09-24 DIAGNOSIS — E785 Hyperlipidemia, unspecified: Secondary | ICD-10-CM | POA: Diagnosis not present

## 2014-09-24 DIAGNOSIS — Z23 Encounter for immunization: Secondary | ICD-10-CM | POA: Diagnosis not present

## 2014-09-24 DIAGNOSIS — E119 Type 2 diabetes mellitus without complications: Secondary | ICD-10-CM | POA: Diagnosis not present

## 2014-09-24 DIAGNOSIS — I2510 Atherosclerotic heart disease of native coronary artery without angina pectoris: Secondary | ICD-10-CM | POA: Diagnosis not present

## 2014-09-24 LAB — CBC WITH DIFFERENTIAL/PLATELET
Basophils Absolute: 0 10*3/uL (ref 0.0–0.1)
Basophils Relative: 0.4 % (ref 0.0–3.0)
EOS ABS: 0 10*3/uL (ref 0.0–0.7)
Eosinophils Relative: 0.6 % (ref 0.0–5.0)
HCT: 40.3 % (ref 36.0–46.0)
Hemoglobin: 13.3 g/dL (ref 12.0–15.0)
LYMPHS ABS: 1.9 10*3/uL (ref 0.7–4.0)
Lymphocytes Relative: 30 % (ref 12.0–46.0)
MCHC: 32.8 g/dL (ref 30.0–36.0)
MCV: 83.7 fl (ref 78.0–100.0)
MONO ABS: 0.4 10*3/uL (ref 0.1–1.0)
Monocytes Relative: 6.2 % (ref 3.0–12.0)
NEUTROS ABS: 4 10*3/uL (ref 1.4–7.7)
NEUTROS PCT: 62.8 % (ref 43.0–77.0)
PLATELETS: 251 10*3/uL (ref 150.0–400.0)
RBC: 4.82 Mil/uL (ref 3.87–5.11)
RDW: 15.7 % — ABNORMAL HIGH (ref 11.5–15.5)
WBC: 6.4 10*3/uL (ref 4.0–10.5)

## 2014-09-24 LAB — LIPID PANEL
CHOLESTEROL: 141 mg/dL (ref 0–200)
HDL: 38.4 mg/dL — ABNORMAL LOW (ref >39.00)
LDL CALC: 81 mg/dL (ref 0–99)
NonHDL: 102.51
TRIGLYCERIDES: 109 mg/dL (ref 0.0–149.0)
Total CHOL/HDL Ratio: 4
VLDL: 21.8 mg/dL (ref 0.0–40.0)

## 2014-09-24 LAB — BASIC METABOLIC PANEL
BUN: 11 mg/dL (ref 6–23)
CALCIUM: 9.8 mg/dL (ref 8.4–10.5)
CO2: 28 meq/L (ref 19–32)
CREATININE: 0.71 mg/dL (ref 0.40–1.20)
Chloride: 100 mEq/L (ref 96–112)
GFR: 88.23 mL/min (ref >60.00)
GLUCOSE: 153 mg/dL — AB (ref 70–99)
Potassium: 4.8 mEq/L (ref 3.5–5.1)
Sodium: 138 mEq/L (ref 135–145)

## 2014-09-24 LAB — HEPATIC FUNCTION PANEL
ALBUMIN: 4.5 g/dL (ref 3.5–5.2)
ALK PHOS: 75 U/L (ref 39–117)
ALT: 23 U/L (ref 0–35)
AST: 17 U/L (ref 0–37)
BILIRUBIN DIRECT: 0.1 mg/dL (ref 0.0–0.3)
BILIRUBIN TOTAL: 0.7 mg/dL (ref 0.2–1.2)
Total Protein: 6.9 g/dL (ref 6.0–8.3)

## 2014-09-24 LAB — MICROALBUMIN / CREATININE URINE RATIO
Creatinine,U: 56.5 mg/dL
MICROALB/CREAT RATIO: 3 mg/g (ref 0.0–30.0)
Microalb, Ur: 1.7 mg/dL (ref 0.0–1.9)

## 2014-09-24 LAB — HEMOGLOBIN A1C: Hgb A1c MFr Bld: 7.5 % — ABNORMAL HIGH (ref 4.6–6.5)

## 2014-09-24 LAB — TSH: TSH: 3.42 u[IU]/mL (ref 0.35–4.50)

## 2014-09-24 NOTE — Progress Notes (Signed)
Pre visit review using our clinic review tool, if applicable. No additional management support is needed unless otherwise documented below in the visit note.  Chief Complaint  Patient presents with  . Follow-up    weight gain  . Diabetes    HPI: Patient  Morgan [name]  64 y.o. comes in today for   Chronic disease management    BG fasting  90- 160  120 - 1   BG  Tend to be am    Feels that  victoza doesn't help her weight and has been gaining weight.  Pretzels she defines as a weakness and tends to eat plenty of these. BP good control  To have stress test. The cousin of her history of heart attack and coronary disease. She has no symptoms chest pain shortness breasts exercise intolerance. Denies any progressive numbness vision changes.  Health Maintenance  Topic Date Due  . HIV Screening  04[birth year]/1966  . ZOSTAVAX  04/03/2011  . URINE MICROALBUMIN  06/20/2013  . INFLUENZA VACCINE  09/06/2014  . HEMOGLOBIN A1C  09/17/2014  . OPHTHALMOLOGY EXAM  09/28/2014  . FOOT EXAM  03/27/2015  . MAMMOGRAM  12/12/2015  . COLONOSCOPY  05/14/2020  . TETANUS/TDAP  09/23/2024  . PNEUMOCOCCAL POLYSACCHARIDE VACCINE  Completed  . Hepatitis C Screening  Completed   Health Maintenance Review LIFESTYLE:  Exercise:  some Tobacco/ETS:no Alcohol: per day  Sugar beverages:no Drug use: no  ROS: she has a history of UTI but the last one treated with Bactrim Septra did very well no recurrence so far. GEN/ HEENT: No fever, significant weight changes sweats headaches vision problems hearing changes, CV/ PULM; No chest pain shortness of breath cough, syncope,edema  change in exercise tolerance. GI /GU: No adominal pain, vomiting, change in bowel habits. No blood in the stool. No significant GU symptoms. SKIN/HEME: ,no acute skin rashes suspicious lesions or bleeding. No lymphadenopathy, nodules, masses.  NEURO/ PSYCH:  No neurologic signs such as weakness numbness. No depression anxiety. IMM/ Allergy:  No unusual infections.  Allergy .   REST of 12 system review negative except as per HPI   Past Medical History  Diagnosis Date  . DIABETES MELLITUS, TYPE II 12/09/2009  . HYPERLIPIDEMIA-MIXED 04/14/2008  . OVERWEIGHT/OBESITY 05/13/2009  . CAD, NATIVE VESSEL 05/13/2009  . Occult blood in stools   . Anemia     as a child   . Hx of abnormal cervical Pap smear     cryo  but normal since then   . Ulcer     Duodenal ulcer by x-ray 40 years ago  . Osteopenia   . Obesity   . Myocardial infarction     2003  . Gastritis   . Gastric ulcer   . Intestinal metaplasia of gastric mucosa 2012  . GERD (gastroesophageal reflux disease)   . Varicose veins     under rx lawson 2016    Past Surgical History  Procedure Laterality Date  . Tubal ligation      One Tube  . Dilation and curettage of uterus    . Fibula fracture surgery    . 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 Cesarean section      x2  . Tonsillectomy    . Ganglion cyst excision    . Colonoscopy    . Upper gastrointestinal endoscopy    . Ectopic pregnancy surgery      Family History  Problem Relation Age of Onset  .  Emphysema Mother     Smoker  . Heart attack Mother     due to head injury  . Alcohol abuse Mother   . Heart disease Mother   . Early death Mother   . Varicose Veins Mother   . AAA (abdominal aortic aneurysm) Mother   . Lung cancer Father   . Heart attack Father   . Hypertension Father   . Alcohol abuse Father   . Heart disease Father     before age 57  . Aneurysm Father   . Hyperlipidemia Father   . Alcohol abuse Brother   . Diabetes Brother   . Heart disease Brother   . Heart attack Brother   . Diabetes Brother   . Aneurysm Paternal Grandmother   . Diabetes      grandfather  . Colon cancer Neg Hx   . Esophageal cancer Neg Hx   . Stomach cancer Neg Hx   . Sudden death Brother     scd    Social History   Social History  . Marital Status: Married    Spouse  Name: N/A  . Number of Children: 2  . Years of Education: phd    Occupational History  . 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 Smoking status: Former Smoker    Quit date: 02/05/1994  . Smokeless tobacco: Never Used  . Alcohol Use: No  . Drug Use: No  . Sexual Activity: Not Asked   Other Topics Concern  . 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    No firearms    NO reg exercise    Pets- Poodles 2    Sleep 7 hours     Outpatient Prescriptions Prior to Visit  Medication Sig Dispense Refill  . aspirin 81 MG tablet Take 81 mg by mouth daily.      Marland Kitchen atorvastatin (LIPITOR) 80 MG tablet Take 1 tablet (80 mg total) by mouth daily. 90 tablet 1  . B-D UF III MINI PEN NEEDLES 31G X 5 MM MISC USE AS DIRECTED 300 each 1  . clindamycin (CLEOCIN) 150 MG capsule Take by mouth. 4 tabs prior to dental work     . INVOKANA 100 MG TABS tablet TAKE 1 TABLET BY MOUTH EVERY DAY 30 tablet 2  . Liraglutide 18 MG/3ML SOPN Inject 0.2 mLs (1.2 mg total) into the skin daily. 3 mL 3  . LORazepam (ATIVAN) 1 MG tablet   0  . metFORMIN (GLUCOPHAGE-XR) 500 MG 24 hr tablet TAKE 2 TABLETS BY MOUTH TWICE DAILY WITH MEALS 360 tablet 1  . Multiple Vitamins-Minerals (MULTIVITAMIN PO) Take by mouth daily.    Marland Kitchen omeprazole (PRILOSEC) 20 MG capsule TAKE ONE CAPSULE BY MOUTH EVERY DAY. 90 capsule 3  . ONE TOUCH ULTRA TEST test strip USE AS DIRECTED TO TEST TWICE DAILY 100 each 6  . ONETOUCH DELICA LANCETS 63Z MISC Use as directed 100 each 10  . ZETIA 10 MG tablet TAKE 1 TABLET BY MOUTH EVERY DAY 90 tablet 1   No facility-administered medications prior to visit.  EXAM:  BP 128/74 mmHg  Temp(Src) 98.5 F (36.9 C) (Oral)  Ht 5\' 5"  (1.651 m)  Wt  189 lb (85.73 kg)  BMI 31.45 kg/m2  Body mass index is 31.45 kg/(m^2).  Physical Exam: Vital signs reviewed QVZ:DGLO is a well-developed well-nourished alert cooperative    who appearsr stated age in no acute distress.  HEENT: normocephalic atraumatic , Eyes: PERRL EOM's full, conjunctiva clear, Nares: paten,t no deformity discharge or tenderness., Ears: no deformity EAC's clear TMs with normal landmarks. Mouth: clear OP, no lesions, edema.  Moist mucous membranes. Dentition in adequate repair. NECK: supple without masses, thyromegaly or bruits. CHEST/PULM:  Clear to auscultation and percussion breath sounds equal no wheeze , rales or rhonchi. No chest wall deformities or tenderness. CV: PMI is nondisplaced, S1 S2 no gallops, murmurs, rubs. Peripheral pulses are full without delay.No JVD .  ABDOMEN: Bowel sounds normal nontender  No guard or rebound, no hepato splenomegal no CVA tenderness.  No hernia. Extremtities:  No clubbing cyanosis or edema, no acute joint swelling or redness no focal atrophy NEURO:  Oriented x3, cranial nerves 3-12 appear to be intact, no obvious focal weakness,gait within normal limits no abnormal reflexes or asymmetrical SKIN: No acute rashes normal turgor, color, no bruising or petechiae. PSYCH: Oriented, good eye contact, no obvious depression anxiety, cognition and judgment appear normal. LN: no cervical axillary inguinal adenopathy  Lab Results  Component Value Date   WBC 6.4 09/24/2014   HGB 13.3 09/24/2014   HCT 40.3 09/24/2014   PLT 251.0 09/24/2014   GLUCOSE 153* 09/24/2014   CHOL 141 09/24/2014   TRIG 109.0 09/24/2014   HDL 38.40* 09/24/2014   LDLCALC 81 09/24/2014   ALT 23 09/24/2014   AST 17 09/24/2014   NA 138 09/24/2014   K 4.8 09/24/2014   CL 100 09/24/2014   CREATININE 0.71 09/24/2014   BUN 11 09/24/2014   CO2 28 09/24/2014   TSH 3.42 09/24/2014   HGBA1C 7.5* 09/24/2014   MICROALBUR 1.7 09/24/2014   Plan : Wellness visit friday am 8  -11 ok cpx labs ahead with cpx and hga1c and micrdoalbumin ratio urine Diabetes Health Maintenance Due  Topic Date Due  . URINE MICROALBUMIN  06/20/2013  . HEMOGLOBIN A1C  09/17/2014  . OPHTHALMOLOGY EXAM  09/28/2014  . FOOT EXAM  03/27/2015    ASSESSMENT AND PLAN:  Discussed the following assessment and plan:  Diabetes mellitus without complication - Plan: Hemoglobin A1c, Lipid panel, TSH, Basic metabolic panel, CBC with Differential/Platelet, Hepatic function panel, Microalbumin / creatinine urine ratio  Medication management - Plan: Hemoglobin A1c, Lipid panel, TSH, Basic metabolic panel, CBC with Differential/Platelet, Hepatic function panel, Microalbumin / creatinine urine ratio  Atherosclerosis of native coronary artery of native heart without angina pectoris - Plan: Hemoglobin A1c, Lipid panel, TSH, Basic metabolic panel, CBC with Differential/Platelet, Hepatic function panel, Microalbumin / creatinine urine ratio  Hyperlipidemia - Plan: Hemoglobin A1c, Lipid panel, TSH, Basic metabolic panel, CBC with Differential/Platelet, Hepatic function panel, Microalbumin / creatinine urine ratio  Need for Tdap vaccination - Plan: Tdap vaccine greater than or equal to 7yo IM We discussed weight gain possibilities medication management and the likelihood that by 80 was helpful but big toes a would not cause weight gain. Certainly some dietary changes are in order to pay attention to these. She will proceed with her cardiovascular assessment as planned. Laboratory studies today to include her lipids and A1c. Also urine microalbumin. We will plan for a wellness check in  6-7 months if things are well otherwise. Total visit 51mins > 50% spent counseling and coordinating care as indicated in above note and in instructions to patient .    Patient Care Team: Burnis Medin, MD as PCP - General (Internal Medicine) Renella Cunas, MD (Cardiology) Lafayette Dragon, MD (Gastroenterology) Amy Martinique, MD as  Consulting Physician (Dermatology) Dian Queen, MD as Attending Physician (Obstetrics and Gynecology) Garald Balding, MD (Orthopedic Surgery) Patient Instructions  No change in meds at this time Will notify you  of labs when available. Work on  Stopping pretzels  They have a high glycemic index .  And may make you hungry.   tdap today  cpx in 6 months or as needed with hga1c  Can do at visit or pre visit     Wanda K. Panosh M.D.

## 2014-09-24 NOTE — Patient Instructions (Signed)
No change in meds at this time Will notify you  of labs when available. Work on  Stopping pretzels  They have a high glycemic index .  And may make you hungry.   tdap today  cpx in 6 months or as needed with hga1c  Can do at visit or pre visit

## 2014-10-07 ENCOUNTER — Other Ambulatory Visit: Payer: Self-pay | Admitting: Internal Medicine

## 2014-10-07 NOTE — Telephone Encounter (Signed)
Sent to the pharmacy by e-scribe. 

## 2014-10-27 ENCOUNTER — Telehealth (HOSPITAL_COMMUNITY): Payer: Self-pay

## 2014-10-27 NOTE — Telephone Encounter (Signed)
Encounter complete. 

## 2014-10-28 ENCOUNTER — Telehealth (HOSPITAL_COMMUNITY): Payer: Self-pay | Admitting: Radiology

## 2014-10-28 NOTE — Telephone Encounter (Signed)
Encounter complete. 

## 2014-10-29 ENCOUNTER — Ambulatory Visit (HOSPITAL_COMMUNITY)
Admission: RE | Admit: 2014-10-29 | Discharge: 2014-10-29 | Disposition: A | Discharge status: Home / Self Care | Payer: BLUE CROSS/BLUE SHIELD | Source: Ambulatory Visit | Attending: Cardiology | Admitting: Cardiology

## 2014-10-29 DIAGNOSIS — E663 Overweight: Secondary | ICD-10-CM | POA: Diagnosis not present

## 2014-10-29 DIAGNOSIS — Z6831 Body mass index (BMI) 31.0-31.9, adult: Secondary | ICD-10-CM | POA: Diagnosis not present

## 2014-10-29 DIAGNOSIS — E119 Type 2 diabetes mellitus without complications: Secondary | ICD-10-CM | POA: Diagnosis not present

## 2014-10-29 DIAGNOSIS — I2510 Atherosclerotic heart disease of native coronary artery without angina pectoris: Secondary | ICD-10-CM

## 2014-10-29 DIAGNOSIS — Z87891 Personal history of nicotine dependence: Secondary | ICD-10-CM | POA: Diagnosis not present

## 2014-10-29 DIAGNOSIS — R9439 Abnormal result of other cardiovascular function study: Secondary | ICD-10-CM | POA: Diagnosis not present

## 2014-10-29 LAB — MYOCARDIAL PERFUSION IMAGING
CHL CUP NUCLEAR SSS: 9
CHL CUP STRESS STAGE 1 DBP: 79 mmHg
CHL CUP STRESS STAGE 1 GRADE: 0 %
CHL CUP STRESS STAGE 1 SBP: 141 mmHg
CHL CUP STRESS STAGE 1 SPEED: 0 mph
CHL CUP STRESS STAGE 2 GRADE: 0 %
CHL CUP STRESS STAGE 2 HR: 65 {beats}/min
CHL CUP STRESS STAGE 2 SPEED: 0 mph
CHL CUP STRESS STAGE 3 DBP: 66 mmHg
CHL CUP STRESS STAGE 3 HR: 88 {beats}/min
CHL CUP STRESS STAGE 3 SBP: 145 mmHg
CHL CUP STRESS STAGE 4 DBP: 70 mmHg
CHL CUP STRESS STAGE 4 SBP: 134 mmHg
CHL CUP STRESS STAGE 4 SPEED: 0 mph
CSEPPBP: 145 mmHg
CSEPPHR: 88 {beats}/min
CSEPPMHR: 56 %
Estimated workload: 1 METS
LV dias vol: 95 mL
LVSYSVOL: 41 mL
NUC STRESS TID: 1.12
Rest HR: 79 {beats}/min
SDS: 1
SRS: 8
Stage 1 HR: 64 {beats}/min
Stage 3 Grade: 0 %
Stage 3 Speed: 0 mph
Stage 4 Grade: 0 %
Stage 4 HR: 71 {beats}/min

## 2014-10-29 MED ORDER — REGADENOSON 0.4 MG/5ML IV SOLN
0.4000 mg | Freq: Once | INTRAVENOUS | Status: AC
  Administered 2014-10-29: 0.4 mg via INTRAVENOUS

## 2014-10-29 MED ORDER — TECHNETIUM TC 99M SESTAMIBI GENERIC - CARDIOLITE
30.4000 | Freq: Once | INTRAVENOUS | Status: AC | PRN
  Administered 2014-10-29: 30.4 via INTRAVENOUS

## 2014-10-29 MED ORDER — TECHNETIUM TC 99M SESTAMIBI GENERIC - CARDIOLITE
10.2000 | Freq: Once | INTRAVENOUS | Status: AC | PRN
  Administered 2014-10-29: 10.2 via INTRAVENOUS

## 2014-11-06 ENCOUNTER — Other Ambulatory Visit: Payer: Self-pay | Admitting: Internal Medicine

## 2014-11-10 ENCOUNTER — Other Ambulatory Visit: Payer: Self-pay | Admitting: Family Medicine

## 2014-11-10 ENCOUNTER — Telehealth: Payer: Self-pay | Admitting: Internal Medicine

## 2014-11-10 DIAGNOSIS — E138 Other specified diabetes mellitus with unspecified complications: Secondary | ICD-10-CM

## 2014-11-10 MED ORDER — EZETIMIBE 10 MG PO TABS: 10.0000 mg | ORAL_TABLET | Freq: Every day | ORAL | Status: DC

## 2014-11-10 NOTE — Telephone Encounter (Signed)
Pt request refill of the following   :ZETIA 10 MG tablet   Phamacy: [facility]

## 2014-11-10 NOTE — Telephone Encounter (Signed)
Pt due for cpx in Feb.  Recently had lab work.  Do you want to repeat all labs? Zetia sent to the pharmacy.  Please advise.  Thanks!

## 2014-11-10 NOTE — Telephone Encounter (Signed)
Please help the pt to make cpx in Feb 2017.  I have placed lab order.  Thanks!

## 2014-11-10 NOTE — Telephone Encounter (Signed)
Only need to repeat hga1c  Ok to refill  For 11 months

## 2014-11-10 NOTE — Telephone Encounter (Signed)
Left message on patient cell to callback and schedule an appt

## 2014-11-10 NOTE — Telephone Encounter (Signed)
Pt has been scheduled.  °

## 2014-11-25 ENCOUNTER — Other Ambulatory Visit: Payer: Self-pay | Admitting: Internal Medicine

## 2014-11-25 NOTE — Telephone Encounter (Signed)
Sent to the pharmacy by e-scribe. 

## 2014-12-09 ENCOUNTER — Other Ambulatory Visit: Payer: Self-pay | Admitting: Internal Medicine

## 2014-12-09 NOTE — Telephone Encounter (Signed)
Sent to the pharmacy by e-scribe. 

## 2014-12-26 ENCOUNTER — Encounter: Payer: Self-pay | Admitting: Internal Medicine

## 2014-12-26 ENCOUNTER — Other Ambulatory Visit: Payer: Self-pay | Admitting: Internal Medicine

## 2014-12-27 MED ORDER — GLUCOSE BLOOD VI STRP: ORAL_STRIP | Status: DC

## 2014-12-27 MED ORDER — LANCETS 30G MISC: Status: DC

## 2014-12-28 ENCOUNTER — Other Ambulatory Visit: Payer: Self-pay | Admitting: Cardiology

## 2015-01-12 ENCOUNTER — Telehealth: Payer: Self-pay | Admitting: Internal Medicine

## 2015-01-12 MED ORDER — LANCETS ULTRA THIN 30G MISC: Status: DC

## 2015-01-12 MED ORDER — OMEPRAZOLE 20 MG PO CPDR: DELAYED_RELEASE_CAPSULE | ORAL | Status: DC

## 2015-01-12 MED ORDER — GLUCOSE BLOOD VI STRP: ORAL_STRIP | Status: DC

## 2015-01-12 NOTE — Telephone Encounter (Signed)
Sent to the pharmacy by e-scribe. 

## 2015-01-12 NOTE — Telephone Encounter (Signed)
Contour next ez blood monitor meter   Pt call to say she need lancets and test strips  Pt said Dr Olevia Perches rx her the following med  omeprazole (PRILOSEC) 20 MG capsule pt said Dr Olevia Perches is retiring and is asking if Dr Regis Bill will rx the med and she will need a 90 day supply       Phamracy [facility] [address]

## 2015-01-12 NOTE — Telephone Encounter (Signed)
Ok to refill x 6 months   Assess at next visit

## 2015-02-22 ENCOUNTER — Other Ambulatory Visit: Payer: Self-pay | Admitting: Internal Medicine

## 2015-02-22 NOTE — Telephone Encounter (Signed)
Sent to the pharmacy by e-scribe. 

## 2015-03-06 ENCOUNTER — Other Ambulatory Visit: Payer: Self-pay | Admitting: Internal Medicine

## 2015-03-07 NOTE — Telephone Encounter (Signed)
Sent to the pharmacy by e-scribe.  Pt has upcoming appt on 04/29/15

## 2015-03-22 ENCOUNTER — Telehealth: Payer: Self-pay | Admitting: Internal Medicine

## 2015-03-22 MED ORDER — LIRAGLUTIDE 18 MG/3ML ~~LOC~~ SOPN: PEN_INJECTOR | SUBCUTANEOUS | Status: DC

## 2015-03-22 NOTE — Telephone Encounter (Signed)
Sent to the pharmacy by e-scribe. 

## 2015-03-22 NOTE — Telephone Encounter (Signed)
Pt needs a refill on victoza  Pens #2 per month w/refills  [facility] [address]

## 2015-03-31 ENCOUNTER — Other Ambulatory Visit: Payer: Self-pay | Admitting: Internal Medicine

## 2015-03-31 NOTE — Telephone Encounter (Signed)
Sent to the pharmacy by e-scribe for 2 months.  Pt has upcoming cpx on 04/29/15.

## 2015-04-22 ENCOUNTER — Other Ambulatory Visit (INDEPENDENT_AMBULATORY_CARE_PROVIDER_SITE_OTHER): Payer: BLUE CROSS/BLUE SHIELD

## 2015-04-22 DIAGNOSIS — E138 Other specified diabetes mellitus with unspecified complications: Secondary | ICD-10-CM

## 2015-04-22 LAB — HEMOGLOBIN A1C: Hgb A1c MFr Bld: 8 % — ABNORMAL HIGH (ref 4.6–6.5)

## 2015-04-28 NOTE — Patient Instructions (Addendum)
a1c is gettng higher  Goal closer to 7  We can add or change med  Check bg bid for 3 weeks and send in readings  Can increase  invokanna .   To 300 mg per day .  byetta  Byduron( weekly)  victoza   Trulicity( weekly)  .  Are the glp one meds   Plan hg a 1c in  4 months    Health Maintenance, Female Adopting a healthy lifestyle and getting preventive care can go a long way to promote health and wellness. Talk with your health care provider about what schedule of regular examinations is right for you. This is a good chance for you to check in with your provider about disease prevention and staying healthy. In between checkups, there are plenty of things you can do on your own. Experts have done a lot of research about which lifestyle changes and preventive measures are most likely to keep you healthy. Ask your health care provider for more information. WEIGHT AND DIET  Eat a healthy diet  Be sure to include plenty of vegetables, fruits, low-fat dairy products, and lean protein.  Do not eat a lot of foods high in solid fats, added sugars, or salt.  Get regular exercise. This is one of the most important things you can do for your health.  Most adults should exercise for at least 150 minutes each week. The exercise should increase your heart rate and make you sweat (moderate-intensity exercise).  Most adults should also do strengthening exercises at least twice a week. This is in addition to the moderate-intensity exercise.  Maintain a healthy weight  Body mass index (BMI) is a measurement that can be used to identify possible weight problems. It estimates body fat based on height and weight. Your health care provider can help determine your BMI and help you achieve or maintain a healthy weight.  For females 62 years of age and older:   A BMI below 18.5 is considered underweight.  A BMI of 18.5 to 24.9 is normal.  A BMI of 25 to 29.9 is considered overweight.  A BMI of 30 and above is  considered obese.  Watch levels of cholesterol and blood lipids  You should start having your blood tested for lipids and cholesterol at 64 years of age, then have this test every 5 years.  You may need to have your cholesterol levels checked more often if:  Your lipid or cholesterol levels are high.  You are older than 64 years of age.  You are at high risk for heart disease.  CANCER SCREENING   Lung Cancer  Lung cancer screening is recommended for adults 23-103 years old who are at high risk for lung cancer because of a history of smoking.  A yearly low-dose CT scan of the lungs is recommended for people who:  Currently smoke.  Have quit within the past 15 years.  Have at least a 30-pack-year history of smoking. A pack year is smoking an average of one pack of cigarettes a day for 1 year.  Yearly screening should continue until it has been 15 years since you quit.  Yearly screening should stop if you develop a health problem that would prevent you from having lung cancer treatment.  Breast Cancer  Practice breast self-awareness. This means understanding how your breasts normally appear and feel.  It also means doing regular breast self-exams. Let your health care provider know about any changes, no matter how small.  If  you are in your 20s or 30s, you should have a clinical breast exam (CBE) by a health care provider every 1-3 years as part of a regular health exam.  If you are 79 or older, have a CBE every year. Also consider having a breast X-ray (mammogram) every year.  If you have a family history of breast cancer, talk to your health care provider about genetic screening.  If you are at high risk for breast cancer, talk to your health care provider about having an MRI and a mammogram every year.  Breast cancer gene (BRCA) assessment is recommended for women who have family members with BRCA-related cancers. BRCA-related cancers  include:  Breast.  Ovarian.  Tubal.  Peritoneal cancers.  Results of the assessment will determine the need for genetic counseling and BRCA1 and BRCA2 testing. Cervical Cancer Your health care provider may recommend that you be screened regularly for cancer of the pelvic organs (ovaries, uterus, and vagina). This screening involves a pelvic examination, including checking for microscopic changes to the surface of your cervix (Pap test). You may be encouraged to have this screening done every 3 years, beginning at age 22.  For women ages 69-65, health care providers may recommend pelvic exams and Pap testing every 3 years, or they may recommend the Pap and pelvic exam, combined with testing for human papilloma virus (HPV), every 5 years. Some types of HPV increase your risk of cervical cancer. Testing for HPV may also be done on women of any age with unclear Pap test results.  Other health care providers may not recommend any screening for nonpregnant women who are considered low risk for pelvic cancer and who do not have symptoms. Ask your health care provider if a screening pelvic exam is right for you.  If you have had past treatment for cervical cancer or a condition that could lead to cancer, you need Pap tests and screening for cancer for at least 20 years after your treatment. If Pap tests have been discontinued, your risk factors (such as having a new sexual partner) need to be reassessed to determine if screening should resume. Some women have medical problems that increase the chance of getting cervical cancer. In these cases, your health care provider may recommend more frequent screening and Pap tests. Colorectal Cancer  This type of cancer can be detected and often prevented.  Routine colorectal cancer screening usually begins at 64 years of age and continues through 64 years of age.  Your health care provider may recommend screening at an earlier age if you have risk factors for  colon cancer.  Your health care provider may also recommend using home test kits to check for hidden blood in the stool.  A small camera at the end of a tube can be used to examine your colon directly (sigmoidoscopy or colonoscopy). This is done to check for the earliest forms of colorectal cancer.  Routine screening usually begins at age 16.  Direct examination of the colon should be repeated every 5-10 years through 64 years of age. However, you may need to be screened more often if early forms of precancerous polyps or small growths are found. Skin Cancer  Check your skin from head to toe regularly.  Tell your health care provider about any new moles or changes in moles, especially if there is a change in a mole's shape or color.  Also tell your health care provider if you have a mole that is larger than the size  of a pencil eraser.  Always use sunscreen. Apply sunscreen liberally and repeatedly throughout the day.  Protect yourself by wearing long sleeves, pants, a wide-brimmed hat, and sunglasses whenever you are outside. HEART DISEASE, DIABETES, AND HIGH BLOOD PRESSURE   High blood pressure causes heart disease and increases the risk of stroke. High blood pressure is more likely to develop in:  People who have blood pressure in the high end of the normal range (130-139/85-89 mm Hg).  People who are overweight or obese.  People who are African American.  If you are 36-15 years of age, have your blood pressure checked every 3-5 years. If you are 58 years of age or older, have your blood pressure checked every year. You should have your blood pressure measured twice--once when you are at a hospital or clinic, and once when you are not at a hospital or clinic. Record the average of the two measurements. To check your blood pressure when you are not at a hospital or clinic, you can use:  An automated blood pressure machine at a pharmacy.  A home blood pressure monitor.  If you  are between 61 years and 74 years old, ask your health care provider if you should take aspirin to prevent strokes.  Have regular diabetes screenings. This involves taking a blood sample to check your fasting blood sugar level.  If you are at a normal weight and have a low risk for diabetes, have this test once every three years after 64 years of age.  If you are overweight and have a high risk for diabetes, consider being tested at a younger age or more often. PREVENTING INFECTION  Hepatitis B  If you have a higher risk for hepatitis B, you should be screened for this virus. You are considered at high risk for hepatitis B if:  You were born in a country where hepatitis B is common. Ask your health care provider which countries are considered high risk.  Your parents were born in a high-risk country, and you have not been immunized against hepatitis B (hepatitis B vaccine).  You have HIV or AIDS.  You use needles to inject street drugs.  You live with someone who has hepatitis B.  You have had sex with someone who has hepatitis B.  You get hemodialysis treatment.  You take certain medicines for conditions, including cancer, organ transplantation, and autoimmune conditions. Hepatitis C  Blood testing is recommended for:  Everyone born from [birth year] through 1964.  Anyone with known risk factors for hepatitis C. Sexually transmitted infections (STIs)  You should be screened for sexually transmitted infections (STIs) including gonorrhea and chlamydia if:  You are sexually active and are younger than 64 years of age.  You are older than 64 years of age and your health care provider tells you that you are at risk for this type of infection.  Your sexual activity has changed since you were last screened and you are at an increased risk for chlamydia or gonorrhea. Ask your health care provider if you are at risk.  If you do not have HIV, but are at risk, it may be recommended that you  take a prescription medicine daily to prevent HIV infection. This is called pre-exposure prophylaxis (PrEP). You are considered at risk if:  You are sexually active and do not regularly use condoms or know the HIV status of your partner(s).  You take drugs by injection.  You are sexually active with a partner who has  HIV. Talk with your health care provider about whether you are at high risk of being infected with HIV. If you choose to begin PrEP, you should first be tested for HIV. You should then be tested every 3 months for as long as you are taking PrEP.  PREGNANCY   If you are premenopausal and you may become pregnant, ask your health care provider about preconception counseling.  If you may become pregnant, take 400 to 800 micrograms (mcg) of folic acid every day.  If you want to prevent pregnancy, talk to your health care provider about birth control (contraception). OSTEOPOROSIS AND MENOPAUSE   Osteoporosis is a disease in which the bones lose minerals and strength with aging. This can result in serious bone fractures. Your risk for osteoporosis can be identified using a bone density scan.  If you are 34 years of age or older, or if you are at risk for osteoporosis and fractures, ask your health care provider if you should be screened.  Ask your health care provider whether you should take a calcium or vitamin D supplement to lower your risk for osteoporosis.  Menopause may have certain physical symptoms and risks.  Hormone replacement therapy may reduce some of these symptoms and risks. Talk to your health care provider about whether hormone replacement therapy is right for you.  HOME CARE INSTRUCTIONS   Schedule regular health, dental, and eye exams.  Stay current with your immunizations.   Do not use any tobacco products including cigarettes, chewing tobacco, or electronic cigarettes.  If you are pregnant, do not drink alcohol.  If you are breastfeeding, limit how  much and how often you drink alcohol.  Limit alcohol intake to no more than 1 drink per day for nonpregnant women. One drink equals 12 ounces of beer, 5 ounces of wine, or 1 ounces of hard liquor.  Do not use street drugs.  Do not share needles.  Ask your health care provider for help if you need support or information about quitting drugs.  Tell your health care provider if you often feel depressed.  Tell your health care provider if you have ever been abused or do not feel safe at home.   This information is not intended to replace advice given to you by your health care provider. Make sure you discuss any questions you have with your health care provider.   Document Released: 08/07/2010 Document Revised: 02/12/2014 Document Reviewed: 12/24/2012 [employer]vier Interactive Patient Education [employer].

## 2015-04-28 NOTE — Progress Notes (Signed)
Pre visit review using our clinic review tool, if applicable. No additional management support is needed unless otherwise documented below in the visit note.  Chief Complaint  Patient presents with  . Annual Exam    med managment  . Diabetes    HPI: Patient  Morgan [name]  65 y.o. comes in today for Preventive Health Care visit  .Marland Kitchen And Chronic disease management  DM sugars not as good    Thinks havving to change  glp1 med for insurance reasons  is the issue.part retired   Sleep better working on it  No dm sx lows.  byetta was better  And  victoza  1.2  Not as good .   On 100 of invokanna   yearaly check cv no sx events  Lipids  On meds no change   Health Maintenance  Topic Date Due  . ZOSTAVAX  04/03/2011  . INFLUENZA VACCINE  09/06/2014  . OPHTHALMOLOGY EXAM  09/28/2014  . HIV Screening  04/28/2016 (Originally 04/02/1965)  . URINE MICROALBUMIN  09/24/2015  . HEMOGLOBIN A1C  10/23/2015  . MAMMOGRAM  12/12/2015  . FOOT EXAM  04/28/2016  . PAP SMEAR  07/29/2017  . COLONOSCOPY  05/14/2020  . TETANUS/TDAP  09/23/2024  . Hepatitis C Screening  Completed   Health Maintenance Review LIFESTYLE:  Exercise:  Some not much recently Tobacco/ETS:n Alcohol: no Sugar beverages:nSleep:better Drug use: no  ROS:  GEN/ HEENT: No fever, significant weight changes sweats headaches vision problems hearing changes, CV/ PULM; No chest pain shortness of breath cough, syncope,edema  change in exercise tolerance. GI /GU: No adominal pain, vomiting, change in bowel habits. No blood in the stool. No significant GU symptoms. SKIN/HEME: ,no acute skin rashes suspicious lesions or bleeding. No lymphadenopathy, nodules, masses.  NEURO/ PSYCH:  No neurologic signs such as weakness numbness. No depression anxiety. IMM/ Allergy: No unusual infections.  Allergy .   REST of 12 system review negative except as per HPI   Past Medical History  Diagnosis Date  . DIABETES MELLITUS, TYPE II  12/09/2009  . HYPERLIPIDEMIA-MIXED 04/14/2008  . OVERWEIGHT/OBESITY 05/13/2009  . CAD, NATIVE VESSEL 05/13/2009  . Occult blood in stools   . Anemia     as a child   . Hx of abnormal cervical Pap smear     cryo  but normal since then   . Ulcer     Duodenal ulcer by x-ray 40 years ago  . Osteopenia   . Obesity   . Myocardial infarction ([place])     2003  . Gastritis   . Gastric ulcer   . Intestinal metaplasia of gastric mucosa 2012  . GERD (gastroesophageal reflux disease)   . Varicose veins     under rx [facility] 2016    Past Surgical History  Procedure Laterality Date  . Tubal ligation      One Tube  . Dilation and curettage of uterus    . Fibula fracture surgery    . 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 Cesarean section      x2  . Tonsillectomy    . Ganglion cyst excision    . Colonoscopy    . Upper gastrointestinal endoscopy    . Ectopic pregnancy surgery      Family History  Problem Relation Age of Onset  . Emphysema Mother     Smoker  . Heart attack Mother     due to head injury  .  Alcohol abuse Mother   . Heart disease Mother   . Early death Mother   . Varicose Veins Mother   . AAA (abdominal aortic aneurysm) Mother   . Lung cancer Father   . Heart attack Father   . Hypertension Father   . Alcohol abuse Father   . Heart disease Father     before age 35  . Aneurysm Father   . Hyperlipidemia Father   . Alcohol abuse Brother   . Diabetes Brother   . Heart disease Brother   . Heart attack Brother   . Diabetes Brother   . Aneurysm Paternal Grandmother   . Diabetes      grandfather  . Colon cancer Neg Hx   . Esophageal cancer Neg Hx   . Stomach cancer Neg Hx   . Sudden death Brother     scd    Social History   Social History  . Marital Status: Married    Spouse Name: N/A  . Number of Children: 2  . Years of Education: phd    Occupational History  . 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 COUNSELOR     Social History Main Topics  . Smoking status: Former Smoker    Quit date: 02/05/1994  . Smokeless tobacco: Never Used  . Alcohol Use: No  . Drug Use: No  . Sexual Activity: Not Asked   Other Topics Concern  . 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    No firearms    NO reg exercise    Pets- Poodles 2    Sleep 7 hours     Outpatient Prescriptions Prior to Visit  Medication Sig Dispense Refill  . aspirin 81 MG tablet Take 81 mg by mouth daily.      Marland Kitchen atorvastatin (LIPITOR) 80 MG tablet TAKE 1 TABLET(80 MG) BY MOUTH DAILY 90 tablet 2  . B-D UF III MINI PEN NEEDLES 31G X 5 MM MISC USE AS DIRECTED. 300 each 2  . clindamycin (CLEOCIN) 150 MG capsule Take by mouth. 4 tabs prior to dental work     . ezetimibe (ZETIA) 10 MG tablet Take 1 tablet (10 mg total) by mouth daily. 90 tablet 1  . glucose blood (BAYER CONTOUR NEXT TEST) test strip Use as instructed 100 each 12  . LANCETS ULTRA THIN 30G MISC Use as instructed (Patient taking differently: Use as instructed for Contour EZ) 100 each 12  . Liraglutide (VICTOZA) 18 MG/3ML SOPN INJECT 1.2 MG INTO THE SKIN DAILY 2 pen 1  . LORazepam (ATIVAN) 1 MG tablet   0  . metFORMIN (GLUCOPHAGE-XR) 500 MG 24 hr tablet TAKE 2 TABLETS BY MOUTH TWICE DAILY WITH MEALS 360 tablet 0  . Multiple Vitamins-Minerals (MULTIVITAMIN PO) Take by mouth daily.    Marland Kitchen omeprazole (PRILOSEC) 20 MG capsule TAKE ONE CAPSULE BY MOUTH EVERY DAY. 90 capsule 1  . INVOKANA 100 MG TABS tablet TAKE 1 TABLET BY MOUTH EVERY DAY 30 tablet 1  . Lancets 30G MISC Use as instructed 100 each 12  . glucose blood (BAYER CONTOUR NEXT TEST) test strip Use as instructed  100 each 12   No facility-administered medications prior to visit.     EXAM:  BP 136/82 mmHg  Temp(Src)  97.8 F (36.6 C) (Oral)  Ht '5\' 5"'  (1.651 m)  Wt 188 lb 8 oz (85.503 kg)  BMI 31.37 kg/m2  Body mass index is 31.37 kg/(m^2).  Physical Exam: Vital signs reviewed PNT:IRWE is a well-developed well-nourished alert cooperative    who appearsr stated age in no acute distress.  HEENT: normocephalic atraumatic , Eyes: PERRL EOM's full, conjunctiva clear, Nares: paten,t no deformity discharge or tenderness., Ears: no deformity EAC's clear TMs with normal landmarks. Mouth: clear OP, no lesions, edema.  Moist mucous membranes. Dentition in adequate repair. NECK: supple without masses, thyromegaly or bruits. CHEST/PULM:  Clear to auscultation and percussion breath sounds equal no wheeze , rales or rhonchi. No chest wall deformities or tenderness. CV: PMI is nondisplaced, S1 S2 no gallops, murmurs, rubs. Peripheral pulses are full without delay.No JVD .  Breast: normal by inspection . No dimpling, discharge, masses, tenderness or discharge . ABDOMEN: Bowel sounds normal nontender  No guard or rebound, no hepato splenomegal no CVA tenderness.  No hernia. Extremtities:  No clubbing cyanosis or edema, no acute joint swelling or redness no focal atrophy NEURO:  Oriented x3, cranial nerves 3-12 appear to be intact, no obvious focal weakness,gait within normal limits no abnormal reflexes or asymmetrical SKIN: No acute rashes normal turgor, color, no bruising or petechiae. PSYCH: Oriented, good eye contact, no obvious depression anxiety, cognition and judgment appear normal. LN: no cervical axillary inguinal adenopathy Diabetic Foot Exam - Simple   Simple Foot Form  Diabetic Foot exam was performed with the following findings:  Yes 04/29/2015  9:24 AM  Visual Inspection  No deformities, no ulcerations, no other skin breakdown bilaterally:  Yes  Sensation Testing  Intact to touch and monofilament testing bilaterally:  Yes  Pulse Check  Posterior Tibialis and Dorsalis pulse intact bilaterally:  Yes    Comments       Lab Results  Component Value Date   WBC 6.4 09/24/2014   HGB 13.3 09/24/2014   HCT 40.3 09/24/2014   PLT 251.0 09/24/2014   GLUCOSE 153* 09/24/2014   CHOL 141 09/24/2014   TRIG 109.0 09/24/2014   HDL 38.40* 09/24/2014   LDLCALC 81 09/24/2014   ALT 23 09/24/2014   AST 17 09/24/2014   NA 138 09/24/2014   K 4.8 09/24/2014   CL 100 09/24/2014   CREATININE 0.71 09/24/2014   BUN 11 09/24/2014   CO2 28 09/24/2014   TSH 3.42 09/24/2014   HGBA1C 8.0* 04/22/2015   MICROALBUR 1.7 09/24/2014   BP Readings from Last 3 Encounters:  04/29/15 136/82  09/24/14 128/74  09/06/14 118/76   Wt Readings from Last 3 Encounters:  04/29/15 188 lb 8 oz (85.503 kg)  10/29/14 192 lb (87.091 kg)  09/24/14 189 lb (85.73 kg)     ASSESSMENT AND PLAN:  Discussed the following assessment and plan:  Visit for preventive health examination  Medication management  Diabetes mellitus without complication ([place]) - deterioration  see plan text inc invokannarecheck 4 month  Atherosclerosis of native coronary artery of native heart without angina pectoris  Hyperlipidemia - no change med  Patient Care Team: Burnis Medin, MD as PCP - General (Internal Medicine) Amy Martinique, MD as Consulting Physician (Dermatology) Dian Queen, MD as Attending Physician (Obstetrics and Gynecology) Garald Balding, MD (Orthopedic Surgery) Patient Instructions  a1c is gettng higher  Goal closer to  7  We can add or change med  Check bg bid for 3 weeks and send in readings  Can increase  invokanna .   To 300 mg per day .  byetta  Byduron( weekly)  victoza   Trulicity( weekly)  .  Are the glp one meds   Plan hg a 1c in  4 months    Health Maintenance, Female Adopting a healthy lifestyle and getting preventive care can go a long way to promote health and wellness. Talk with your health care provider about what schedule of regular examinations is right for you. This is a good chance for you to  check in with your provider about disease prevention and staying healthy. In between checkups, there are plenty of things you can do on your own. Experts have done a lot of research about which lifestyle changes and preventive measures are most likely to keep you healthy. Ask your health care provider for more information. WEIGHT AND DIET  Eat a healthy diet  Be sure to include plenty of vegetables, fruits, low-fat dairy products, and lean protein.  Do not eat a lot of foods high in solid fats, added sugars, or salt.  Get regular exercise. This is one of the most important things you can do for your health.  Most adults should exercise for at least 150 minutes each week. The exercise should increase your heart rate and make you sweat (moderate-intensity exercise).  Most adults should also do strengthening exercises at least twice a week. This is in addition to the moderate-intensity exercise.  Maintain a healthy weight  Body mass index (BMI) is a measurement that can be used to identify possible weight problems. It estimates body fat based on height and weight. Your health care provider can help determine your BMI and help you achieve or maintain a healthy weight.  For females 92 years of age and older:   A BMI below 18.5 is considered underweight.  A BMI of 18.5 to 24.9 is normal.  A BMI of 25 to 29.9 is considered overweight.  A BMI of 30 and above is considered obese.  Watch levels of cholesterol and blood lipids  You should start having your blood tested for lipids and cholesterol at 64 years of age, then have this test every 5 years.  You may need to have your cholesterol levels checked more often if:  Your lipid or cholesterol levels are high.  You are older than 64 years of age.  You are at high risk for heart disease.  CANCER SCREENING   Lung Cancer  Lung cancer screening is recommended for adults 50-38 years old who are at high risk for lung cancer because of a  history of smoking.  A yearly low-dose CT scan of the lungs is recommended for people who:  Currently smoke.  Have quit within the past 15 years.  Have at least a 30-pack-year history of smoking. A pack year is smoking an average of one pack of cigarettes a day for 1 year.  Yearly screening should continue until it has been 15 years since you quit.  Yearly screening should stop if you develop a health problem that would prevent you from having lung cancer treatment.  Breast Cancer  Practice breast self-awareness. This means understanding how your breasts normally appear and feel.  It also means doing regular breast self-exams. Let your health care provider know about any changes, no matter how small.  If you are in your 20s or 30s,  you should have a clinical breast exam (CBE) by a health care provider every 1-3 years as part of a regular health exam.  If you are 47 or older, have a CBE every year. Also consider having a breast X-ray (mammogram) every year.  If you have a family history of breast cancer, talk to your health care provider about genetic screening.  If you are at high risk for breast cancer, talk to your health care provider about having an MRI and a mammogram every year.  Breast cancer gene (BRCA) assessment is recommended for women who have family members with BRCA-related cancers. BRCA-related cancers include:  Breast.  Ovarian.  Tubal.  Peritoneal cancers.  Results of the assessment will determine the need for genetic counseling and BRCA1 and BRCA2 testing. Cervical Cancer Your health care provider may recommend that you be screened regularly for cancer of the pelvic organs (ovaries, uterus, and vagina). This screening involves a pelvic examination, including checking for microscopic changes to the surface of your cervix (Pap test). You may be encouraged to have this screening done every 3 years, beginning at age 52.  For women ages 37-65, health care  providers may recommend pelvic exams and Pap testing every 3 years, or they may recommend the Pap and pelvic exam, combined with testing for human papilloma virus (HPV), every 5 years. Some types of HPV increase your risk of cervical cancer. Testing for HPV may also be done on women of any age with unclear Pap test results.  Other health care providers may not recommend any screening for nonpregnant women who are considered low risk for pelvic cancer and who do not have symptoms. Ask your health care provider if a screening pelvic exam is right for you.  If you have had past treatment for cervical cancer or a condition that could lead to cancer, you need Pap tests and screening for cancer for at least 20 years after your treatment. If Pap tests have been discontinued, your risk factors (such as having a new sexual partner) need to be reassessed to determine if screening should resume. Some women have medical problems that increase the chance of getting cervical cancer. In these cases, your health care provider may recommend more frequent screening and Pap tests. Colorectal Cancer  This type of cancer can be detected and often prevented.  Routine colorectal cancer screening usually begins at 64 years of age and continues through 64 years of age.  Your health care provider may recommend screening at an earlier age if you have risk factors for colon cancer.  Your health care provider may also recommend using home test kits to check for hidden blood in the stool.  A small camera at the end of a tube can be used to examine your colon directly (sigmoidoscopy or colonoscopy). This is done to check for the earliest forms of colorectal cancer.  Routine screening usually begins at age 66.  Direct examination of the colon should be repeated every 5-10 years through 64 years of age. However, you may need to be screened more often if early forms of precancerous polyps or small growths are found. Skin  Cancer  Check your skin from head to toe regularly.  Tell your health care provider about any new moles or changes in moles, especially if there is a change in a mole's shape or color.  Also tell your health care provider if you have a mole that is larger than the size of a pencil eraser.  Always use  sunscreen. Apply sunscreen liberally and repeatedly throughout the day.  Protect yourself by wearing long sleeves, pants, a wide-brimmed hat, and sunglasses whenever you are outside. HEART DISEASE, DIABETES, AND HIGH BLOOD PRESSURE   High blood pressure causes heart disease and increases the risk of stroke. High blood pressure is more likely to develop in:  People who have blood pressure in the high end of the normal range (130-139/85-89 mm Hg).  People who are overweight or obese.  People who are African American.  If you are 85-58 years of age, have your blood pressure checked every 3-5 years. If you are 11 years of age or older, have your blood pressure checked every year. You should have your blood pressure measured twice--once when you are at a hospital or clinic, and once when you are not at a hospital or clinic. Record the average of the two measurements. To check your blood pressure when you are not at a hospital or clinic, you can use:  An automated blood pressure machine at a pharmacy.  A home blood pressure monitor.  If you are between 49 years and 20 years old, ask your health care provider if you should take aspirin to prevent strokes.  Have regular diabetes screenings. This involves taking a blood sample to check your fasting blood sugar level.  If you are at a normal weight and have a low risk for diabetes, have this test once every three years after 64 years of age.  If you are overweight and have a high risk for diabetes, consider being tested at a younger age or more often. PREVENTING INFECTION  Hepatitis B  If you have a higher risk for hepatitis B, you should be  screened for this virus. You are considered at high risk for hepatitis B if:  You were born in a country where hepatitis B is common. Ask your health care provider which countries are considered high risk.  Your parents were born in a high-risk country, and you have not been immunized against hepatitis B (hepatitis B vaccine).  You have HIV or AIDS.  You use needles to inject street drugs.  You live with someone who has hepatitis B.  You have had sex with someone who has hepatitis B.  You get hemodialysis treatment.  You take certain medicines for conditions, including cancer, organ transplantation, and autoimmune conditions. Hepatitis C  Blood testing is recommended for:  Everyone born from [birth year] through 1964.  Anyone with known risk factors for hepatitis C. Sexually transmitted infections (STIs)  You should be screened for sexually transmitted infections (STIs) including gonorrhea and chlamydia if:  You are sexually active and are younger than 64 years of age.  You are older than 64 years of age and your health care provider tells you that you are at risk for this type of infection.  Your sexual activity has changed since you were last screened and you are at an increased risk for chlamydia or gonorrhea. Ask your health care provider if you are at risk.  If you do not have HIV, but are at risk, it may be recommended that you take a prescription medicine daily to prevent HIV infection. This is called pre-exposure prophylaxis (PrEP). You are considered at risk if:  You are sexually active and do not regularly use condoms or know the HIV status of your partner(s).  You take drugs by injection.  You are sexually active with a partner who has HIV. Talk with your health care provider about  whether you are at high risk of being infected with HIV. If you choose to begin PrEP, you should first be tested for HIV. You should then be tested every 3 months for as long as you are taking  PrEP.  PREGNANCY   If you are premenopausal and you may become pregnant, ask your health care provider about preconception counseling.  If you may become pregnant, take 400 to 800 micrograms (mcg) of folic acid every day.  If you want to prevent pregnancy, talk to your health care provider about birth control (contraception). OSTEOPOROSIS AND MENOPAUSE   Osteoporosis is a disease in which the bones lose minerals and strength with aging. This can result in serious bone fractures. Your risk for osteoporosis can be identified using a bone density scan.  If you are 47 years of age or older, or if you are at risk for osteoporosis and fractures, ask your health care provider if you should be screened.  Ask your health care provider whether you should take a calcium or vitamin D supplement to lower your risk for osteoporosis.  Menopause may have certain physical symptoms and risks.  Hormone replacement therapy may reduce some of these symptoms and risks. Talk to your health care provider about whether hormone replacement therapy is right for you.  HOME CARE INSTRUCTIONS   Schedule regular health, dental, and eye exams.  Stay current with your immunizations.   Do not use any tobacco products including cigarettes, chewing tobacco, or electronic cigarettes.  If you are pregnant, do not drink alcohol.  If you are breastfeeding, limit how much and how often you drink alcohol.  Limit alcohol intake to no more than 1 drink per day for nonpregnant women. One drink equals 12 ounces of beer, 5 ounces of wine, or 1 ounces of hard liquor.  Do not use street drugs.  Do not share needles.  Ask your health care provider for help if you need support or information about quitting drugs.  Tell your health care provider if you often feel depressed.  Tell your health care provider if you have ever been abused or do not feel safe at home.   This information is not intended to replace advice given  to you by your health care provider. Make sure you discuss any questions you have with your health care provider.   Document Released: 08/07/2010 Document Revised: 02/12/2014 Document Reviewed: 12/24/2012 Elsevier Interactive Patient Education 2016 [place]. Jaelynn Currier M.D.

## 2015-04-29 ENCOUNTER — Encounter: Payer: Self-pay | Admitting: Internal Medicine

## 2015-04-29 ENCOUNTER — Ambulatory Visit (INDEPENDENT_AMBULATORY_CARE_PROVIDER_SITE_OTHER): Payer: BLUE CROSS/BLUE SHIELD | Admitting: Internal Medicine

## 2015-04-29 VITALS — BP 136/82 | Temp 97.8°F | Ht 65.0 in | Wt 188.5 lb

## 2015-04-29 DIAGNOSIS — Z0000 Encounter for general adult medical examination without abnormal findings: Secondary | ICD-10-CM

## 2015-04-29 DIAGNOSIS — E785 Hyperlipidemia, unspecified: Secondary | ICD-10-CM

## 2015-04-29 DIAGNOSIS — E119 Type 2 diabetes mellitus without complications: Secondary | ICD-10-CM

## 2015-04-29 DIAGNOSIS — I2510 Atherosclerotic heart disease of native coronary artery without angina pectoris: Secondary | ICD-10-CM | POA: Diagnosis not present

## 2015-04-29 DIAGNOSIS — Z79899 Other long term (current) drug therapy: Secondary | ICD-10-CM | POA: Diagnosis not present

## 2015-04-29 MED ORDER — CANAGLIFLOZIN 300 MG PO TABS: 300.0000 mg | ORAL_TABLET | Freq: Every day | ORAL | Status: DC

## 2015-05-01 ENCOUNTER — Other Ambulatory Visit: Payer: Self-pay | Admitting: Internal Medicine

## 2015-05-01 NOTE — Assessment & Plan Note (Signed)
See text  Deterioration max metformin inc invokanna to 300 mg and fu 4 months  Investigate  insuranc coverage for glp 1 drug group. Cost etc  Could inc to 1.8 dose or change consider endo consutl

## 2015-05-02 ENCOUNTER — Encounter: Payer: Self-pay | Admitting: Internal Medicine

## 2015-05-04 ENCOUNTER — Ambulatory Visit (INDEPENDENT_AMBULATORY_CARE_PROVIDER_SITE_OTHER): Payer: BLUE CROSS/BLUE SHIELD | Admitting: Internal Medicine

## 2015-05-04 ENCOUNTER — Encounter: Payer: Self-pay | Admitting: Internal Medicine

## 2015-05-04 VITALS — BP 110/70 | Temp 97.5°F | Wt 183.0 lb

## 2015-05-04 DIAGNOSIS — E119 Type 2 diabetes mellitus without complications: Secondary | ICD-10-CM

## 2015-05-04 DIAGNOSIS — J111 Influenza due to unidentified influenza virus with other respiratory manifestations: Secondary | ICD-10-CM

## 2015-05-04 DIAGNOSIS — R69 Illness, unspecified: Principal | ICD-10-CM

## 2015-05-04 DIAGNOSIS — J989 Respiratory disorder, unspecified: Secondary | ICD-10-CM | POA: Diagnosis not present

## 2015-05-04 DIAGNOSIS — R509 Fever, unspecified: Secondary | ICD-10-CM

## 2015-05-04 MED ORDER — OSELTAMIVIR PHOSPHATE 75 MG PO CAPS: 75.0000 mg | ORAL_CAPSULE | Freq: Two times a day (BID) | ORAL | Status: DC

## 2015-05-04 NOTE — Patient Instructions (Signed)

## 2015-05-04 NOTE — Progress Notes (Signed)
Chief Complaint  Patient presents with  . Nasal Congestion    HPI: Morgan [name] 65 y.o.  Comes in for acute visit  Sudden onset  Onset   Monday felt sick   And had to go home from work and noted to have temp 101  Chest sx.   Weak feels weak.  This am 101.1  And took 2 ibuprofen .   Tired all over no cp sob  Mild congestion  No vomiting seems weak. Cough no sob .  No rash  .  bg ok  No flu vaccine this year.  ROS: See pertinent positives and negatives per HPI.  Past Medical History  Diagnosis Date  . DIABETES MELLITUS, TYPE II 12/09/2009  . HYPERLIPIDEMIA-MIXED 04/14/2008  . OVERWEIGHT/OBESITY 05/13/2009  . CAD, NATIVE VESSEL 05/13/2009  . Occult blood in stools   . Anemia     as a child   . Hx of abnormal cervical Pap smear     cryo  but normal since then   . Ulcer     Duodenal ulcer by x-ray 40 years ago  . Osteopenia   . Obesity   . Myocardial infarction ([place])     2003  . Gastritis   . Gastric ulcer   . Intestinal metaplasia of gastric mucosa 2012  . GERD (gastroesophageal reflux disease)   . Varicose veins     under rx lawson 2016    Family History  Problem Relation Age of Onset  . Emphysema Mother     Smoker  . Heart attack Mother     due to head injury  . Alcohol abuse Mother   . Heart disease Mother   . Early death Mother   . Varicose Veins Mother   . AAA (abdominal aortic aneurysm) Mother   . Lung cancer Father   . Heart attack Father   . Hypertension Father   . Alcohol abuse Father   . Heart disease Father     before age 76  . Aneurysm Father   . Hyperlipidemia Father   . Alcohol abuse Brother   . Diabetes Brother   . Heart disease Brother   . Heart attack Brother   . Diabetes Brother   . Aneurysm Paternal Grandmother   . Diabetes      grandfather  . Colon cancer Neg Hx   . Esophageal cancer Neg Hx   . Stomach cancer Neg Hx   . Sudden death Brother     scd    Social History   Social History  . Marital Status: Married    Spouse Name:  N/A  . Number of Children: 2  . Years of Education: phd    Occupational History  . 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 Smoking status: Former Smoker    Quit date: 02/05/1994  . Smokeless tobacco: Never Used  . Alcohol Use: No  . Drug Use: No  . Sexual Activity: Not Asked   Other Topics Concern  . 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  No firearms    NO reg exercise    Pets- Poodles 2    Sleep 7 hours     Outpatient Prescriptions Prior to Visit  Medication Sig Dispense Refill  . aspirin 81 MG tablet Take 81 mg by mouth daily.      Marland Kitchen atorvastatin (LIPITOR) 80 MG tablet TAKE 1 TABLET(80 MG) BY MOUTH DAILY 90 tablet 2  . B-D UF III MINI PEN NEEDLES 31G X 5 MM MISC USE AS DIRECTED. 300 each 2  . canagliflozin (INVOKANA) 300 MG TABS tablet Take 1 tablet (300 mg total) by mouth daily before breakfast. 30 tablet 6  . clindamycin (CLEOCIN) 150 MG capsule Take by mouth. 4 tabs prior to dental work     . glucose blood (BAYER CONTOUR NEXT TEST) test strip Use as instructed 100 each 12  . LANCETS ULTRA THIN 30G MISC Use as instructed (Patient taking differently: Use as instructed for Contour EZ) 100 each 12  . Liraglutide (VICTOZA) 18 MG/3ML SOPN INJECT 1.2 MG INTO THE SKIN DAILY 2 pen 1  . LORazepam (ATIVAN) 1 MG tablet   0  . metFORMIN (GLUCOPHAGE-XR) 500 MG 24 hr tablet TAKE 2 TABLETS BY MOUTH TWICE DAILY WITH MEALS 360 tablet 0  . Multiple Vitamins-Minerals (MULTIVITAMIN PO) Take by mouth daily.    Marland Kitchen omeprazole (PRILOSEC) 20 MG capsule TAKE ONE CAPSULE BY MOUTH EVERY DAY. 90 capsule 1  . ZETIA 10 MG tablet TAKE 1 TABLET BY MOUTH DAILY 90 tablet 0   No facility-administered medications prior to visit.      EXAM:  BP 110/70 mmHg  Temp(Src) 97.5 F (36.4 C) (Oral)  Wt 183 lb (83.008 kg)  Body mass index is 30.45 kg/(m^2).  GENERAL: vitals reviewed and listed above, alert, oriented, appears well hydrated and in no acute distresssi k non toxic   Nl resp HEENT: atraumatic, conjunctiva  clear, no obvious abnormalities on inspection of external nose and ears tm clear OP : no lesion edema or exudate  midl congestion NECK: no obvious masses on inspection palpation  LUNGS: clear to auscultation bilaterally, no wheezes, rales or rhonchi, good air movement CV: HRRR, no clubbing cyanosis or  peripheral edema nl cap refill  Abdomen:  Sof,t normal bowel sounds without hepatosplenomegaly, no guarding rebound or masses no CVA tenderness MS: moves all extremities without noticeable focal  abnormality  Skin: normal capillary refill ,turgor , color: No acute rashes ,petechiae or bruising Cognition norma;  ASSESSMENT AND PLAN:  Discussed the following assessment and plan:  Influenza-like illness - hig prvalence in comunity acts like flu higher risk patient tamiflu benefit  more than risk   Febrile respiratory illness  Diabetes mellitus without complication ([place]) - limit  invokanna if risk of getting dehydrated    Expectant management. Rest fluids  Fu if persistent fever or alarm sx  -Patient advised to return or notify health care team  if symptoms worsen ,persist or new concerns arise.  Patient Instructions      Influenza, Adult Influenza ("the flu") is a viral infection of the respiratory tract. It occurs more often in winter months because people spend more time in close contact with one another. Influenza can make you feel very sick. Influenza easily spreads from person to person (contagious). CAUSES  Influenza is caused by a virus that infects the respiratory tract. You can catch the virus by breathing in droplets from an infected person's cough or sneeze. You can also catch the virus by  touching something that  was recently contaminated with the virus and then touching your mouth, nose, or eyes. RISKS AND COMPLICATIONS You may be at risk for a more severe case of influenza if you smoke cigarettes, have diabetes, have chronic heart disease (such as heart failure) or lung disease (such as asthma), or if you have a weakened immune system. Elderly people and pregnant women are also at risk for more serious infections. The most common problem of influenza is a lung infection (pneumonia). Sometimes, this problem can require emergency medical care and may be life threatening. SIGNS AND SYMPTOMS  Symptoms typically last 4 to 10 days and may include:  Fever.  Chills.  Headache, body aches, and muscle aches.  Sore throat.  Chest discomfort and cough.  Poor appetite.  Weakness or feeling tired.  Dizziness.  Nausea or vomiting. DIAGNOSIS  Diagnosis of influenza is often made based on your history and a physical exam. A nose or throat swab test can be done to confirm the diagnosis. TREATMENT  In mild cases, influenza goes away on its own. Treatment is directed at relieving symptoms. For more severe cases, your health care provider may prescribe antiviral medicines to shorten the sickness. Antibiotic medicines are not effective because the infection is caused by a virus, not by bacteria. HOME CARE INSTRUCTIONS  Take medicines only as directed by your health care provider.  Use a cool mist humidifier to make breathing easier.  Get plenty of rest until your temperature returns to normal. This usually takes 3 to 4 days.  Drink enough fluid to keep your urine clear or pale yellow.  Cover yourmouth and nosewhen coughing or sneezing,and wash your handswellto prevent thevirusfrom spreading.  Stay homefromwork orschool untilthe fever is gonefor at least 55full day. PREVENTION  An annual influenza vaccination (flu shot) is the best way to avoid getting influenza. An  annual flu shot is now routinely recommended for all adults in the [place] IF:  You experiencechest pain, yourcough worsens,or you producemore mucus.  Youhave nausea,vomiting, ordiarrhea.  Your fever returns or gets worse. SEEK IMMEDIATE MEDICAL CARE IF:  You havetrouble breathing, you become short of breath,or your skin ornails becomebluish.  You have severe painor stiffnessin the neck.  You develop a sudden headache, or pain in the face or ear.  You have nausea or vomiting that you cannot control. MAKE SURE YOU:   Understand these instructions.  Will watch your condition.  Will get help right away if you are not doing well or get worse.   This information is not intended to replace advice given to you by your health care provider. Make sure you discuss any questions you have with your health care provider.   Document Released: 01/20/2000 Document Revised: 02/12/2014 Document Reviewed: 04/23/2011 Elsevier Interactive Patient Education 2016 [place]. Cormac Wint M.D.

## 2015-05-04 NOTE — Telephone Encounter (Signed)
patient was seen in office

## 2015-05-05 ENCOUNTER — Encounter: Payer: Self-pay | Admitting: Family Medicine

## 2015-05-05 ENCOUNTER — Telehealth: Payer: Self-pay | Admitting: Internal Medicine

## 2015-05-05 NOTE — Telephone Encounter (Signed)
Pt notified that letter had been faxed.  Received confirmation that the fax was successful.

## 2015-05-05 NOTE — Telephone Encounter (Signed)
Pt would like a dr's note stating she has the flu and was unable to travel. Pt had a trip planned and if she has a dr's note she may be able to get her money back. Pt needs faxed today if possible.   Fax  [phone]  Attn Lennart Pall

## 2015-05-28 ENCOUNTER — Other Ambulatory Visit: Payer: Self-pay | Admitting: Internal Medicine

## 2015-05-31 NOTE — Telephone Encounter (Signed)
Sent to the pharmacy by e-scribe. 

## 2015-06-23 ENCOUNTER — Other Ambulatory Visit: Payer: Self-pay | Admitting: Internal Medicine

## 2015-06-24 NOTE — Telephone Encounter (Signed)
Sent to the pharmacy by e-scribe. 

## 2015-07-08 ENCOUNTER — Other Ambulatory Visit: Payer: Self-pay | Admitting: Internal Medicine

## 2015-07-08 NOTE — Telephone Encounter (Signed)
Sent in refill has upcoming appt.

## 2015-07-08 NOTE — Telephone Encounter (Signed)
Pt is requesting a refill. Last filled on 01/12/15 #90 +1. Last OV 05/04/15. Okay to refill?

## 2015-07-23 ENCOUNTER — Other Ambulatory Visit: Payer: Self-pay | Admitting: Internal Medicine

## 2015-07-25 NOTE — Telephone Encounter (Signed)
Sent to the pharmacy by e-scribe. 

## 2015-07-27 ENCOUNTER — Other Ambulatory Visit: Payer: Self-pay | Admitting: Internal Medicine

## 2015-08-03 ENCOUNTER — Other Ambulatory Visit: Payer: Self-pay | Admitting: Cardiology

## 2015-08-04 NOTE — Telephone Encounter (Signed)
Rx(s) sent to pharmacy electronically.  

## 2015-08-19 ENCOUNTER — Other Ambulatory Visit (INDEPENDENT_AMBULATORY_CARE_PROVIDER_SITE_OTHER): Payer: BLUE CROSS/BLUE SHIELD

## 2015-08-19 DIAGNOSIS — E119 Type 2 diabetes mellitus without complications: Secondary | ICD-10-CM

## 2015-08-19 DIAGNOSIS — I10 Essential (primary) hypertension: Secondary | ICD-10-CM

## 2015-08-19 LAB — BASIC METABOLIC PANEL
BUN: 10 mg/dL (ref 6–23)
CO2: 27 mEq/L (ref 19–32)
Calcium: 9.8 mg/dL (ref 8.4–10.5)
Chloride: 99 mEq/L (ref 96–112)
Creatinine, Ser: 0.74 mg/dL (ref 0.40–1.20)
GFR: 83.88 mL/min (ref >60.00)
Glucose, Bld: 155 mg/dL — ABNORMAL HIGH (ref 70–99)
POTASSIUM: 4.3 meq/L (ref 3.5–5.1)
SODIUM: 137 meq/L (ref 135–145)

## 2015-08-19 LAB — LIPID PANEL
CHOL/HDL RATIO: 4
Cholesterol: 159 mg/dL (ref 0–200)
HDL: 42.7 mg/dL (ref >39.00)
LDL Cholesterol: 101 mg/dL — ABNORMAL HIGH (ref 0–99)
NONHDL: 116.17
Triglycerides: 78 mg/dL (ref 0.0–149.0)
VLDL: 15.6 mg/dL (ref 0.0–40.0)

## 2015-08-19 LAB — HEMOGLOBIN A1C: Hgb A1c MFr Bld: 7.8 % — ABNORMAL HIGH (ref 4.6–6.5)

## 2015-08-24 NOTE — Progress Notes (Signed)
Pre visit review using our clinic review tool, if applicable. No additional management support is needed unless otherwise documented below in the visit note.  Chief Complaint  Patient presents with  . Follow-up    HPI: Morgan [name] 65 y.o.  Comes in for fu dm .  Since last visit didn't start 300 mg invokanna right away  Used up the 100 mg for about 6 weeks or so  Now on 300mg    Doing well has medial great toe numbness no other or sx .    Has fu cards soon.   Bp has been good.   Still on omeprezole as per dr Olevia Perches   gerd controlled but ? If some tye iof dysplasia    ROS: See pertinent positives and negatives per HPI.  Past Medical History  Diagnosis Date  . DIABETES MELLITUS, TYPE II 12/09/2009  . HYPERLIPIDEMIA-MIXED 04/14/2008  . OVERWEIGHT/OBESITY 05/13/2009  . CAD, NATIVE VESSEL 05/13/2009  . Occult blood in stools   . Anemia     as a child   . Hx of abnormal cervical Pap smear     cryo  but normal since then   . Ulcer     Duodenal ulcer by x-ray 40 years ago  . Osteopenia   . Obesity   . Myocardial infarction ([place])     2003  . Gastritis   . Gastric ulcer   . Intestinal metaplasia of gastric mucosa 2012  . GERD (gastroesophageal reflux disease)   . Varicose veins     under rx lawson 2016    Family History  Problem Relation Age of Onset  . Emphysema Mother     Smoker  . Heart attack Mother     due to head injury  . Alcohol abuse Mother   . Heart disease Mother   . Early death Mother   . Varicose Veins Mother   . AAA (abdominal aortic aneurysm) Mother   . Lung cancer Father   . Heart attack Father   . Hypertension Father   . Alcohol abuse Father   . Heart disease Father     before age 59  . Aneurysm Father   . Hyperlipidemia Father   . Alcohol abuse Brother   . Diabetes Brother   . Heart disease Brother   . Heart attack Brother   . Diabetes Brother   . Aneurysm Paternal Grandmother   . Diabetes      grandfather  . Colon cancer Neg Hx   .  Esophageal cancer Neg Hx   . Stomach cancer Neg Hx   . Sudden death Brother     scd    Social History   Social History  . Marital Status: Married    Spouse Name: N/A  . Number of Children: 2  . Years of Education: phd    Occupational History  . 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 Smoking status: Former Smoker    Quit date: 02/05/1994  . Smokeless tobacco: Never Used  . Alcohol Use: No  . Drug Use: No  . Sexual Activity: Not Asked   Other Topics Concern  . 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    No firearms    NO reg exercise    Pets- Poodles 2    Sleep 7 hours     Outpatient Prescriptions Prior to Visit  Medication Sig Dispense Refill  . aspirin 81 MG tablet Take 81 mg by mouth daily.      Marland Kitchen atorvastatin (LIPITOR) 80 MG tablet TAKE 1 TABLET(80 MG) BY MOUTH DAILY 90 tablet 3  . canagliflozin (INVOKANA) 300 MG TABS tablet Take 1 tablet (300 mg total) by mouth daily before breakfast. 30 tablet 6  . clindamycin (CLEOCIN) 150 MG capsule Take by mouth. 4 tabs prior to dental work     . glucose blood (BAYER CONTOUR NEXT TEST) test strip Use as instructed 100 each 12  . metFORMIN (GLUCOPHAGE-XR) 500 MG 24 hr tablet TAKE 2 TABLETS BY MOUTH TWICE DAILY WITH MEALS 360 tablet 0  . Multiple Vitamins-Minerals (MULTIVITAMIN PO) Take by mouth daily.    Marland Kitchen omeprazole (PRILOSEC) 20 MG capsule TAKE 1 CAPSULE BY MOUTH EVERY DAY 90 capsule 0  . VICTOZA 18 MG/3ML SOPN ADMINISTER 1.2 MG UNDER THE SKIN DAILY 6 mL 1  . ZETIA 10 MG tablet TAKE 1 TABLET BY MOUTH DAILY 90 tablet 0  . B-D UF III MINI PEN NEEDLES 31G X 5 MM MISC USE AS DIRECTED. 300 each 2  . Liraglutide (VICTOZA) 18 MG/3ML SOPN INJECT 1.2 MG INTO THE SKIN DAILY  2 pen 1  . ZETIA 10 MG tablet TAKE 1 TABLET BY MOUTH DAILY 90 tablet 0  . LANCETS ULTRA THIN 30G MISC Use as instructed (Patient taking differently: Use as instructed for Contour EZ) 100 each 12  . LORazepam (ATIVAN) 1 MG tablet   0  . oseltamivir (TAMIFLU) 75 MG capsule Take 1 capsule (75 mg total) by mouth 2 (two) times daily. 10 capsule 0   No facility-administered medications prior to visit.     EXAM:  BP 136/70 mmHg  Temp(Src) 98.2 F (36.8 C) (Oral)  Wt 184 lb 6.4 oz (83.643 kg)  Body mass index is 30.69 kg/(m^2).  GENERAL: vitals reviewed and listed above, alert, oriented, appears well hydrated and in no acute distress HEENT: atraumatic, conjunctiva  clear, no obvious abnormalities on inspection of external nose and earsMS: moves all extremities without noticeable focal  Abnormality feet painted nails  Medial mild callus  Medial dec sensastion no ulcer nl cp refill  PSYCH: pleasant and cooperative, no obvious depression or anxiety Lab Results  Component Value Date   WBC 6.4 09/24/2014   HGB 13.3 09/24/2014   HCT 40.3 09/24/2014   PLT 251.0 09/24/2014   GLUCOSE 155* 08/19/2015   CHOL 159 08/19/2015   TRIG 78.0 08/19/2015   HDL 42.70 08/19/2015   LDLCALC 101* 08/19/2015   ALT 23 09/24/2014   AST 17 09/24/2014   NA 137 08/19/2015   K 4.3 08/19/2015   CL 99 08/19/2015   CREATININE 0.74 08/19/2015   BUN 10 08/19/2015   CO2 27 08/19/2015   TSH 3.42 09/24/2014   HGBA1C 7.8* 08/19/2015   MICROALBUR 1.7 09/24/2014   . Diabetes Health Maintenance Due  Topic Date Due  . OPHTHALMOLOGY EXAM  09/28/2014  . URINE MICROALBUMIN  09/24/2015  . HEMOGLOBIN A1C  02/19/2016  . FOOT EXAM  04/28/2016   ASSESSMENT AND PLAN:  Discussed the following assessment and plan:  Diabetes mellitus without complication ([place]) - Plan: Microalbumin /  creatinine urine ratio  GERD without esophagitis ... -Patient advised to return or notify health care team  if symptoms worsen ,persist or  new concerns arise.  Patient Instructions  Stay on the  300 mg of the invokanna   As planned  Recheck hga 1c in about 4 months  Can do at visit .  Urine today for protein/ screen .     Standley Brooking. Saydee Zolman M.D.  The mucosa of the esophagus appeared normal; multiple biopsies were performed Irregular z-line Bx's Minimal antral gastritis -Bx's RECOMMENDATIONS: 1. Await pathology results 2. Continue PPI REPEAT EXAM: for EGD pending biopsy results. eSigned: Lafayette Dragon, MD 01/11/2014 4:56 PM Path mild gastritis neg h pylori and neg  Metaplasia

## 2015-08-25 ENCOUNTER — Ambulatory Visit (INDEPENDENT_AMBULATORY_CARE_PROVIDER_SITE_OTHER): Payer: BLUE CROSS/BLUE SHIELD | Admitting: Internal Medicine

## 2015-08-25 ENCOUNTER — Encounter: Payer: Self-pay | Admitting: Internal Medicine

## 2015-08-25 VITALS — BP 136/70 | Temp 98.2°F | Wt 184.4 lb

## 2015-08-25 DIAGNOSIS — K219 Gastro-esophageal reflux disease without esophagitis: Secondary | ICD-10-CM | POA: Diagnosis not present

## 2015-08-25 DIAGNOSIS — E119 Type 2 diabetes mellitus without complications: Secondary | ICD-10-CM

## 2015-08-25 LAB — MICROALBUMIN / CREATININE URINE RATIO
CREATININE, U: 16.2 mg/dL
Microalb Creat Ratio: 4.3 mg/g (ref 0.0–30.0)
Microalb, Ur: <0.7 mg/dL (ref 0.0–1.9)

## 2015-08-25 MED ORDER — INSULIN PEN NEEDLE 31G X 5 MM MISC: Status: DC

## 2015-08-25 NOTE — Assessment & Plan Note (Signed)
On prilosec hx of ulcer disease remote per dr Nechama Guard if continued daily  use benefit more than risk /

## 2015-08-25 NOTE — Assessment & Plan Note (Signed)
Patient wishes to try  Longer on 300 invokanna before either increasing  victoza dose or change i n other plan . ( says did well on byetta in the past)  NO obv complication unlessthe focal toe numbness  Is related but could be compression .  urine micro today . rov 4 months with a1c at visit

## 2015-08-25 NOTE — Patient Instructions (Addendum)
Stay on the  300 mg of the invokanna   As planned  Recheck hga 1c in about 4 months  Can do at visit .  Urine today for protein/ screen .

## 2015-08-29 ENCOUNTER — Other Ambulatory Visit: Payer: Self-pay | Admitting: Internal Medicine

## 2015-08-30 NOTE — Telephone Encounter (Signed)
Sent to the pharmacy by e-scribe for 6 months. 

## 2015-08-31 ENCOUNTER — Encounter: Payer: Self-pay | Admitting: Cardiology

## 2015-09-02 ENCOUNTER — Other Ambulatory Visit: Payer: Self-pay | Admitting: Internal Medicine

## 2015-09-02 ENCOUNTER — Telehealth: Payer: Self-pay | Admitting: Internal Medicine

## 2015-09-02 MED ORDER — BAYER MICROLET LANCETS MISC: 100.0000 | Freq: Two times a day (BID) | 11 refills | Status: DC

## 2015-09-02 MED ORDER — GLUCOSE BLOOD VI STRP: ORAL_STRIP | 11 refills | Status: DC

## 2015-09-02 NOTE — Telephone Encounter (Signed)
Pt needs  bayer contour next ez test strips and lancets send to [facility]. Pt is test twice a day. Pt is out

## 2015-09-02 NOTE — Telephone Encounter (Signed)
Sent to the pharmacy by e-scribe. 

## 2015-09-05 NOTE — Telephone Encounter (Signed)
Sent to the pharmacy for 1 year per [facility]

## 2015-09-18 ENCOUNTER — Other Ambulatory Visit: Payer: Self-pay | Admitting: Internal Medicine

## 2015-09-19 ENCOUNTER — Other Ambulatory Visit: Payer: Self-pay | Admitting: Internal Medicine

## 2015-09-20 NOTE — Telephone Encounter (Signed)
SENT TO THE PHARMACY BY E-SCRIBE. 

## 2015-09-20 NOTE — Telephone Encounter (Signed)
Sent to the pharmacy by e-scribe.  Pt has upcoming appt on 12/23/15

## 2015-10-14 ENCOUNTER — Ambulatory Visit: Payer: Self-pay | Admitting: Cardiology

## 2015-10-26 ENCOUNTER — Encounter: Payer: Self-pay | Admitting: Cardiology

## 2015-11-04 NOTE — Progress Notes (Signed)
HPI: FU CAD. Previous PCI of her RCA following myocardial infarction in 2003. Echocardiogram in 2004 showed normal LV function. Abd ultrasound 11/15 showed no aneurysm. Nuclear study 9/16 showed EF 57 and inability to evaluate inferior wall but otherwise normal perfusion. Seen last seen, the patient has dyspnea with more extreme activities but not with routine activities. It is relieved with rest. It is not associated with chest pain. There is no orthopnea, PND or pedal edema. There is no syncope or palpitations. There is no exertional chest pain.   Current Outpatient Prescriptions  Medication Sig Dispense Refill  . aspirin 81 MG tablet Take 81 mg by mouth daily.      Marland Kitchen atorvastatin (LIPITOR) 80 MG tablet TAKE 1 TABLET(80 MG) BY MOUTH DAILY 90 tablet 3  . BAYER MICROLET LANCETS lancets 100 each by Other route 2 (two) times daily. Use as instructed 100 each 11  . canagliflozin (INVOKANA) 300 MG TABS tablet Take 1 tablet (300 mg total) by mouth daily before breakfast. 30 tablet 6  . clindamycin (CLEOCIN) 150 MG capsule Take by mouth. 4 tabs prior to dental work     . glucose blood (BAYER CONTOUR NEXT TEST) test strip Use as instructed twice daily 100 each 11  . Insulin Pen Needle (B-D UF III MINI PEN NEEDLES) 31G X 5 MM MISC USE AS DIRECTED. 300 each 3  . metFORMIN (GLUCOPHAGE-XR) 500 MG 24 hr tablet TAKE 2 TABLETS BY MOUTH TWICE DAILY WITH MEALS 360 tablet 1  . Multiple Vitamins-Minerals (MULTIVITAMIN PO) Take by mouth daily.    Marland Kitchen omeprazole (PRILOSEC) 20 MG capsule TAKE 1 CAPSULE BY MOUTH EVERY DAY 90 capsule 1  . VICTOZA 18 MG/3ML SOPN ADMINISTER 1.2 MG UNDER THE SKIN DAILY 2 pen 3  . ZETIA 10 MG tablet TAKE 1 TABLET BY MOUTH DAILY 90 tablet 0   No current facility-administered medications for this visit.      Past Medical History:  Diagnosis Date  . Anemia    as a child   . CAD, NATIVE VESSEL 05/13/2009  . DIABETES MELLITUS, TYPE II 12/09/2009  . Gastric ulcer   . Gastritis   .  GERD (gastroesophageal reflux disease)   . Hx of abnormal cervical Pap smear    cryo  but normal since then   . HYPERLIPIDEMIA-MIXED 04/14/2008  . Intestinal metaplasia of gastric mucosa 2012  . Myocardial infarction    2003  . Obesity   . Occult blood in stools   . Osteopenia   . OVERWEIGHT/OBESITY 05/13/2009  . Ulcer ([place])    Duodenal ulcer by x-ray 40 years ago  . Varicose veins    under rx lawson 2016    Past Surgical History:  Procedure Laterality Date  . CESAREAN SECTION     x2  . CLOSED REDUCTION PROXIMAL TIBIOFIBULAR JOINT [place]  2004   left  . COLONOSCOPY    . DILATION AND CURETTAGE OF UTERUS    . ECTOPIC PREGNANCY SURGERY    . FIBULA FRACTURE SURGERY    . GANGLION CYST EXCISION    . TONSILLECTOMY    . TUBAL LIGATION     One Tube  . UPPER GASTROINTESTINAL ENDOSCOPY      Social History   Social History  . Marital status: Married    Spouse name: N/A  . Number of children: 2  . Years of education: phd    Occupational History  . 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 Smoking status: Former Smoker    Quit date: 02/05/1994  . Smokeless tobacco: Never Used  . Alcohol use No  . Drug use: No  . Sexual activity: Not on file   Other Topics Concern  . 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    No firearms    NO reg exercise    Pets- Poodles 2    Sleep 7 hours     Family History  Problem Relation Age of Onset  . Emphysema Mother     Smoker  . Heart attack Mother     due to head injury  . Alcohol abuse Mother   . Heart disease Mother   . Early death Mother   . Varicose Veins Mother   . AAA (abdominal aortic aneurysm) Mother   . Lung cancer Father   .  Heart attack Father   . Hypertension Father   . Alcohol abuse Father   . Heart disease Father     before age 9  . Aneurysm Father   . Hyperlipidemia Father   . Alcohol abuse Brother   . Diabetes Brother   . Heart disease Brother   . Heart attack Brother   . Diabetes Brother   . Aneurysm Paternal Grandmother   . Diabetes      grandfather  . Sudden death Brother     scd  . Colon cancer Neg Hx   . Esophageal cancer Neg Hx   . Stomach cancer Neg Hx     ROS: Some leg cramping but  no fevers or chills, productive cough, hemoptysis, dysphasia, odynophagia, melena, hematochezia, dysuria, hematuria, rash, seizure activity, orthopnea, PND, pedal edema, claudication. Remaining systems are negative.  Physical Exam: Well-developed well-nourished in no acute distress.  Skin is warm and dry.  HEENT is normal.  Neck is supple. Bilateral bruits Chest is clear to auscultation with normal expansion.  Cardiovascular exam is regular rate and rhythm.  Abdominal exam nontender or distended. No masses palpated. Extremities show no edema. neuro grossly intact  ECG-Sinus rhythm with first-degree AV block. Normal axis. No ST changes.  A/P  1 Hyperlipidemia-continue statin and zetia; last LDL 101. Discussed diet  2 coronary artery disease-continue aspirin and statin. No chest pain.  3 obesity-we discussed the importance of weight loss, diet and exercise.  4 bruit-Schedule carotid Dopplers.   Kirk Ruths, MD

## 2015-11-09 ENCOUNTER — Ambulatory Visit (INDEPENDENT_AMBULATORY_CARE_PROVIDER_SITE_OTHER): Payer: BLUE CROSS/BLUE SHIELD | Admitting: Cardiology

## 2015-11-09 ENCOUNTER — Encounter: Payer: Self-pay | Admitting: Cardiology

## 2015-11-09 VITALS — BP 110/70 | HR 75 | Ht 65.0 in | Wt 186.4 lb

## 2015-11-09 DIAGNOSIS — R0989 Other specified symptoms and signs involving the circulatory and respiratory systems: Secondary | ICD-10-CM

## 2015-11-09 DIAGNOSIS — I2510 Atherosclerotic heart disease of native coronary artery without angina pectoris: Secondary | ICD-10-CM

## 2015-11-09 DIAGNOSIS — E784 Other hyperlipidemia: Secondary | ICD-10-CM | POA: Diagnosis not present

## 2015-11-09 DIAGNOSIS — E7849 Other hyperlipidemia: Secondary | ICD-10-CM

## 2015-11-09 MED ORDER — EZETIMIBE 10 MG PO TABS: 10.0000 mg | ORAL_TABLET | Freq: Every day | ORAL | 3 refills | Status: DC

## 2015-11-09 MED ORDER — ATORVASTATIN CALCIUM 80 MG PO TABS: ORAL_TABLET | ORAL | 3 refills | Status: DC

## 2015-11-09 NOTE — Patient Instructions (Signed)
Medication Instructions:   NO CHANGE  Testing/Procedures:  Your physician has requested that you have a carotid duplex. This test is an ultrasound of the carotid arteries in your neck. It looks at blood flow through these arteries that supply the brain with blood. Allow one hour for this exam. There are no restrictions or special instructions.    Follow-Up:  Your physician wants you to follow-up in: ONE YEAR WITH DR CRENSHAW You will receive a reminder letter in the mail two months in advance. If you don't receive a letter, please call our office to schedule the follow-up appointment.   If you need a refill on your cardiac medications before your next appointment, please call your pharmacy.    

## 2015-11-25 ENCOUNTER — Other Ambulatory Visit: Payer: Self-pay | Admitting: Internal Medicine

## 2015-11-28 ENCOUNTER — Ambulatory Visit (HOSPITAL_COMMUNITY)
Admission: RE | Admit: 2015-11-28 | Discharge: 2015-11-28 | Disposition: A | Discharge status: Home / Self Care | Payer: BLUE CROSS/BLUE SHIELD | Source: Ambulatory Visit | Attending: Cardiology | Admitting: Cardiology

## 2015-11-28 ENCOUNTER — Encounter: Payer: Self-pay | Admitting: Internal Medicine

## 2015-11-28 DIAGNOSIS — R0989 Other specified symptoms and signs involving the circulatory and respiratory systems: Secondary | ICD-10-CM | POA: Diagnosis not present

## 2015-11-28 DIAGNOSIS — I6523 Occlusion and stenosis of bilateral carotid arteries: Secondary | ICD-10-CM | POA: Diagnosis not present

## 2015-11-30 ENCOUNTER — Telehealth: Payer: Self-pay | Admitting: *Deleted

## 2015-11-30 DIAGNOSIS — R9439 Abnormal result of other cardiovascular function study: Secondary | ICD-10-CM

## 2015-11-30 NOTE — Telephone Encounter (Signed)
-----   Message from Lelon Perla, MD sent at 11/28/2015  2:27 PM EDT ----- Schedule dedicated thyroid ultrasound Kirk Ruths

## 2015-11-30 NOTE — Telephone Encounter (Signed)
Left message for pt to call.

## 2015-11-30 NOTE — Telephone Encounter (Signed)
Pt says she is in conference every hour.You can leave her a message or a direct line to call you back. She will be able to call only 15 minutes after every hour.

## 2015-11-30 NOTE — Telephone Encounter (Signed)
Left massage for pt, order placed for thyroid US and number to GI given to the patient to schedule.

## 2015-12-09 NOTE — Progress Notes (Signed)
Pre visit review using our clinic review tool, if applicable. No additional management support is needed unless otherwise documented below in the visit note.  Chief Complaint  Patient presents with  . Follow-up    HPI:  TIARE DEEGAN 65 y.o. comes in today for follow-up of diabetes medical issues. She did see her cardiologist Dr. Stanford Breed who did a Doppler Carotids and   Said  Thyroid  Was grainy  To have dedicated  US .  Of thyroid  Asks how worried she should be.    No other major change in health but feeling tired  ? thyorid other or age.  No sx of OSA  per se .   Uncertain about gettting the flu vaccine but  Did get the flu last year .    BG ocass 170 down to 150 range no  New nuero sx  cv sx.   Bp better but dr Stanford Breed advising best control   ROS: See pertinent positives and negatives per HPI.  Past Medical History:  Diagnosis Date  . Anemia    as a child   . CAD, NATIVE VESSEL 05/13/2009  . DIABETES MELLITUS, TYPE II 12/09/2009  . Gastric ulcer   . Gastritis   . GERD (gastroesophageal reflux disease)   . Hx of abnormal cervical Pap smear    cryo  but normal since then   . HYPERLIPIDEMIA-MIXED 04/14/2008  . Intestinal metaplasia of gastric mucosa 2012  . Myocardial infarction    2003  . Obesity   . Occult blood in stools   . Osteopenia   . OVERWEIGHT/OBESITY 05/13/2009  . Ulcer ([place])    Duodenal ulcer by x-ray 40 years ago  . Varicose veins    under rx lawson 2016    Family History  Problem Relation Age of Onset  . Emphysema Mother     Smoker  . Heart attack Mother     due to head injury  . Alcohol abuse Mother   . Heart disease Mother   . Early death Mother   . Varicose Veins Mother   . AAA (abdominal aortic aneurysm) Mother   . Lung cancer Father   . Heart attack Father   . Hypertension Father   . Alcohol abuse Father   . Heart disease Father     before age 85  . Aneurysm Father   . Hyperlipidemia Father   . Alcohol abuse Brother   . Diabetes  Brother   . Heart disease Brother   . Heart attack Brother   . Diabetes Brother   . Aneurysm Paternal Grandmother   . Diabetes      grandfather  . Sudden death Brother     scd  . Colon cancer Neg Hx   . Esophageal cancer Neg Hx   . Stomach cancer Neg Hx     Social History   Social History  . Marital status: Married    Spouse name: N/A  . Number of children: 2  . Years of education: phd    Occupational History  . 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 Smoking status: Former Smoker    Quit date: 02/05/1994  . Smokeless tobacco: Never Used  . Alcohol use No  . Drug use: No  . Sexual activity: Not Asked   Other Topics Concern  . 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    No firearms    NO reg exercise    Pets- Poodles 2    Sleep 7 hours     Outpatient Medications Prior to Visit  Medication Sig Dispense Refill  . aspirin 81 MG tablet Take 81 mg by mouth daily.      Marland Kitchen atorvastatin (LIPITOR) 80 MG tablet TAKE 1 TABLET(80 MG) BY MOUTH DAILY 90 tablet 3  . BAYER MICROLET LANCETS lancets 100 each by Other route 2 (two) times daily. Use as instructed 100 each 11  . clindamycin (CLEOCIN) 150 MG capsule Take by mouth. 4 tabs prior to dental work     . ezetimibe (ZETIA) 10 MG tablet Take 1 tablet (10 mg total) by mouth daily. 90 tablet 3  . glucose blood (BAYER CONTOUR NEXT TEST) test strip Use as instructed twice daily 100 each 11  . Insulin Pen Needle (B-D UF III MINI PEN NEEDLES) 31G X 5 MM MISC USE AS DIRECTED. 300 each 3  . INVOKANA 300 MG TABS tablet TAKE 1 TABLET(300 MG) BY MOUTH DAILY BEFORE BREAKFAST 30 tablet 5  . metFORMIN (GLUCOPHAGE-XR) 500 MG 24 hr tablet TAKE 2 TABLETS BY MOUTH  TWICE DAILY WITH MEALS 360 tablet 1  . Multiple Vitamins-Minerals (MULTIVITAMIN PO) Take by mouth daily.    Marland Kitchen omeprazole (PRILOSEC) 20 MG capsule TAKE 1 CAPSULE BY MOUTH EVERY DAY 90 capsule 1  . VICTOZA 18 MG/3ML SOPN ADMINISTER 1.2 MG UNDER THE SKIN DAILY 2 pen 3   No facility-administered medications prior to visit.      EXAM:  BP 128/72 (BP Location: Right Arm, Patient Position: Sitting, Cuff Size: Normal)   Pulse 74   Temp 98 F (36.7 C) (Oral)   Wt 185 lb 6.4 oz (84.1 kg)   SpO2 98%   BMI 30.85 kg/m   Body mass index is 30.85 kg/m.  GENERAL: vitals reviewed and listed above, alert, oriented, appears well hydrated and in no acute distress HEENT: atraumatic, conjunctiva  clear, no obvious abnormalities on inspection of external nose and ears  NECK: no obvious masses on inspection thhyroid palpable but no nodule mass felt on  palpation  LUNGS: clear to auscultation bilaterally, no wheezes, rales or rhonchi, good air movement CV: HRRR, no clubbing cyanosis or  peripheral edema nl cap refill   Has vv no  MS: moves all extremities without noticeable focal  abnormality PSYCH: pleasant and cooperative, no obvious depression or anxiety Lab Results  Component Value Date   WBC 6.4 09/24/2014   HGB 13.3 09/24/2014   HCT 40.3 09/24/2014   PLT 251.0 09/24/2014   GLUCOSE 155 (H) 08/19/2015   CHOL 159 08/19/2015   TRIG 78.0 08/19/2015   HDL 42.70 08/19/2015   LDLCALC 101 (H) 08/19/2015   ALT 23 09/24/2014   AST 17 09/24/2014   NA 137 08/19/2015   K 4.3 08/19/2015   CL 99 08/19/2015   CREATININE 0.74 08/19/2015   BUN 10 08/19/2015   CO2 27 08/19/2015   TSH 3.42 09/24/2014   HGBA1C 7.8 (H) 08/19/2015   MICROALBUR <0.7 08/25/2015   BP Readings from Last 3 Encounters:  12/12/15 128/72  11/09/15 110/70  08/25/15 136/70  Wt Readings from Last 3 Encounters:  12/12/15 185 lb 6.4 oz (84.1 kg)  11/09/15 186 lb 6.4 oz (84.6 kg)  08/25/15 184 lb 6.4 oz (83.6 kg)   Reviewed  findings w patient [place] etc   ASSESSMENT AND PLAN:  Discussed the following assessment and plan:  Diabetes mellitus without complication ([place]) - consdier inc veictoza if tolerated if a1c not improved - Plan: CBC with Differential/Platelet, Hemoglobin A1c, Hepatic function panel, TSH, T4, free  Medication management - Plan: CBC with Differential/Platelet, Hemoglobin A1c, Hepatic function panel, TSH, T4, free  Tired - [prob  multifactorial check cbc lfts and thyroid tests - Plan: CBC with Differential/Platelet, Hemoglobin A1c, Hepatic function panel, TSH, T4, free  Elevated blood pressure reading - nl today   Abnormal imaging of thyroid - vasc US reviewed  no nodule comment on textrue - Plan: CBC with Differential/Platelet, Hemoglobin A1c, Hepatic function panel, TSH, T4, free  Hyperlipidemia, unspecified hyperlipidemia type - Plan: CBC with Differential/Platelet, Hemoglobin A1c, Hepatic function panel, TSH, T4, free  Atherosclerosis of native coronary artery of native heart without angina pectoris  Need for prophylactic vaccination and inoculation against influenza - Plan: Flu Vaccine QUAD 36+ mos PF IM (Fluarix & Fluzone Quad PF)  Risk benefit of  Flu vaccine discussed.   Proceed  Will bneed new gi doc dr Olevia Perches retired   She can contact gi dept about  reassignment  Total visit 24mins > 50% spent counseling and or coordinating care as indicated in above note and in instructions to patient .   -Patient advised to return or notify health care team  if symptoms worsen ,persist or new concerns arise.  Patient Instructions  Will notify you  of labs when available. thryoid  Imaging abnormalites are very common and not usually clinically significant  Consider  Increasing   victoza to 1.8 per day as tolerated .   Plan fu depending  On  labs  And how doing but can do CPX PV in 4 months or so     Mariann Laster K. Jameya Pontiff M.D.

## 2015-12-12 ENCOUNTER — Encounter: Payer: Self-pay | Admitting: Internal Medicine

## 2015-12-12 ENCOUNTER — Ambulatory Visit (INDEPENDENT_AMBULATORY_CARE_PROVIDER_SITE_OTHER): Payer: BLUE CROSS/BLUE SHIELD | Admitting: Internal Medicine

## 2015-12-12 VITALS — BP 128/72 | HR 74 | Temp 98.0°F | Wt 185.4 lb

## 2015-12-12 DIAGNOSIS — R030 Elevated blood-pressure reading, without diagnosis of hypertension: Secondary | ICD-10-CM | POA: Diagnosis not present

## 2015-12-12 DIAGNOSIS — I2510 Atherosclerotic heart disease of native coronary artery without angina pectoris: Secondary | ICD-10-CM

## 2015-12-12 DIAGNOSIS — E119 Type 2 diabetes mellitus without complications: Secondary | ICD-10-CM

## 2015-12-12 DIAGNOSIS — R5383 Other fatigue: Secondary | ICD-10-CM | POA: Diagnosis not present

## 2015-12-12 DIAGNOSIS — E785 Hyperlipidemia, unspecified: Secondary | ICD-10-CM

## 2015-12-12 DIAGNOSIS — Z23 Encounter for immunization: Secondary | ICD-10-CM

## 2015-12-12 DIAGNOSIS — R946 Abnormal results of thyroid function studies: Secondary | ICD-10-CM

## 2015-12-12 DIAGNOSIS — Z79899 Other long term (current) drug therapy: Secondary | ICD-10-CM | POA: Diagnosis not present

## 2015-12-12 DIAGNOSIS — R9389 Abnormal findings on diagnostic imaging of other specified body structures: Secondary | ICD-10-CM

## 2015-12-12 LAB — CBC WITH DIFFERENTIAL/PLATELET
BASOS ABS: 0 10*3/uL (ref 0.0–0.1)
Basophils Relative: 0.4 % (ref 0.0–3.0)
EOS ABS: 0 10*3/uL (ref 0.0–0.7)
Eosinophils Relative: 0.6 % (ref 0.0–5.0)
HCT: 37.8 % (ref 36.0–46.0)
Hemoglobin: 12.3 g/dL (ref 12.0–15.0)
LYMPHS ABS: 1.6 10*3/uL (ref 0.7–4.0)
Lymphocytes Relative: 22.7 % (ref 12.0–46.0)
MCHC: 32.6 g/dL (ref 30.0–36.0)
MCV: 78.3 fl (ref 78.0–100.0)
MONO ABS: 0.4 10*3/uL (ref 0.1–1.0)
Monocytes Relative: 5.6 % (ref 3.0–12.0)
NEUTROS ABS: 4.9 10*3/uL (ref 1.4–7.7)
NEUTROS PCT: 70.7 % (ref 43.0–77.0)
PLATELETS: 268 10*3/uL (ref 150.0–400.0)
RBC: 4.82 Mil/uL (ref 3.87–5.11)
RDW: 16.3 % — ABNORMAL HIGH (ref 11.5–15.5)
WBC: 7 10*3/uL (ref 4.0–10.5)

## 2015-12-12 LAB — HEPATIC FUNCTION PANEL
ALT: 24 U/L (ref 0–35)
AST: 18 U/L (ref 0–37)
Albumin: 4.5 g/dL (ref 3.5–5.2)
Alkaline Phosphatase: 70 U/L (ref 39–117)
BILIRUBIN DIRECT: 0.2 mg/dL (ref 0.0–0.3)
BILIRUBIN TOTAL: 0.6 mg/dL (ref 0.2–1.2)
TOTAL PROTEIN: 6.9 g/dL (ref 6.0–8.3)

## 2015-12-12 LAB — T4, FREE: FREE T4: 0.88 ng/dL (ref 0.60–1.60)

## 2015-12-12 LAB — HEMOGLOBIN A1C: HEMOGLOBIN A1C: 8.1 % — AB (ref 4.6–6.5)

## 2015-12-12 LAB — TSH: TSH: 3.48 u[IU]/mL (ref 0.35–4.50)

## 2015-12-12 NOTE — Patient Instructions (Addendum)
Will notify you  of labs when available. thryoid  Imaging abnormalites are very common and not usually clinically significant  Consider  Increasing   victoza to 1.8 per day as tolerated .   Plan fu depending  On  labs  And how doing but can do CPX PV in 4 months or so

## 2015-12-19 ENCOUNTER — Telehealth: Payer: Self-pay | Admitting: Internal Medicine

## 2015-12-19 NOTE — Telephone Encounter (Signed)
° ° °  Pt call to ask if the following med can be called in. She has a pimple on her .  [place] and [address]

## 2015-12-19 NOTE — Telephone Encounter (Signed)
Left a message for a return call.

## 2015-12-21 ENCOUNTER — Encounter: Payer: Self-pay | Admitting: Internal Medicine

## 2015-12-21 ENCOUNTER — Ambulatory Visit
Admission: RE | Admit: 2015-12-21 | Discharge: 2015-12-21 | Disposition: A | Discharge status: Home / Self Care | Payer: BLUE CROSS/BLUE SHIELD | Source: Ambulatory Visit | Attending: Cardiology | Admitting: Cardiology

## 2015-12-21 ENCOUNTER — Ambulatory Visit (INDEPENDENT_AMBULATORY_CARE_PROVIDER_SITE_OTHER): Payer: BLUE CROSS/BLUE SHIELD | Admitting: Internal Medicine

## 2015-12-21 VITALS — BP 150/78 | Temp 97.8°F | Wt 188.2 lb

## 2015-12-21 DIAGNOSIS — R21 Rash and other nonspecific skin eruption: Secondary | ICD-10-CM

## 2015-12-21 DIAGNOSIS — L089 Local infection of the skin and subcutaneous tissue, unspecified: Secondary | ICD-10-CM | POA: Diagnosis not present

## 2015-12-21 DIAGNOSIS — E119 Type 2 diabetes mellitus without complications: Secondary | ICD-10-CM | POA: Diagnosis not present

## 2015-12-21 DIAGNOSIS — E0789 Other specified disorders of thyroid: Secondary | ICD-10-CM | POA: Diagnosis not present

## 2015-12-21 DIAGNOSIS — R9439 Abnormal result of other cardiovascular function study: Secondary | ICD-10-CM

## 2015-12-21 DIAGNOSIS — D497 Neoplasm of unspecified behavior of endocrine glands and other parts of nervous system: Secondary | ICD-10-CM

## 2015-12-21 MED ORDER — DOXYCYCLINE HYCLATE 100 MG PO TABS: 100.0000 mg | ORAL_TABLET | Freq: Two times a day (BID) | ORAL | 0 refills | Status: DC

## 2015-12-21 MED ORDER — LIRAGLUTIDE 18 MG/3ML ~~LOC~~ SOPN
PEN_INJECTOR | SUBCUTANEOUS | 3 refills | Status: DC
Start: 2015-12-21 — End: 2016-06-07

## 2015-12-21 NOTE — Patient Instructions (Addendum)
Thyroid ok   Follow exam  .  Chest rash could be   Topical reaction .   Antibiotic   Doxycycline  For 10 day . thisis  A skin infection  Expect improvement in the next 48 - 72 hours if progressing may need to  reasses or change treatment .

## 2015-12-21 NOTE — Progress Notes (Signed)
Pre visit review using our clinic review tool, if applicable. No additional management support is needed unless otherwise documented below in the visit note.  Chief Complaint  Patient presents with  . Rash    Rash on chest and breakout on face.    HPI: Morgan [name] 65 y.o. patient walks in today asking about sore spot on her face if she needs to be seen. Worked into the schedule. She uses some retinoids on the face but nothing new and just over the last few days has a right cheek pimple-like lesion is becoming sore and swollen without associated fever. She was on medicine for rosacea long time ago. She also developed a rash on the anterior chest and been using the retinoid on it. Had labs review of her diabetes and had her thyroid scan this morning. ROS: See pertinent positives and negatives per HPI. RDW was slightly up and she started taking B12 and iron and feels better as far as the fatigue goes.  Past Medical History:  Diagnosis Date  . Anemia    as a child   . CAD, NATIVE VESSEL 05/13/2009  . DIABETES MELLITUS, TYPE II 12/09/2009  . Gastric ulcer   . Gastritis   . GERD (gastroesophageal reflux disease)   . Hx of abnormal cervical Pap smear    cryo  but normal since then   . HYPERLIPIDEMIA-MIXED 04/14/2008  . Intestinal metaplasia of gastric mucosa 2012  . Myocardial infarction    2003  . Obesity   . Occult blood in stools   . Osteopenia   . OVERWEIGHT/OBESITY 05/13/2009  . Ulcer ([place])    Duodenal ulcer by x-ray 40 years ago  . Varicose veins    under rx [facility]son 2016    Family History  Problem Relation Age of Onset  . Emphysema Mother     Smoker  . Heart attack Mother     due to head injury  . Alcohol abuse Mother   . Heart disease Mother   . Early death Mother   . Varicose Veins Mother   . AAA (abdominal aortic aneurysm) Mother   . Lung cancer Father   . Heart attack Father   . Hypertension Father   . Alcohol abuse Father   . Heart disease Father     before  age 79  . Aneurysm Father   . Hyperlipidemia Father   . Alcohol abuse Brother   . Diabetes Brother   . Heart disease Brother   . Heart attack Brother   . Diabetes Brother   . Aneurysm Paternal Grandmother   . Diabetes      grandfather  . Sudden death Brother     scd  . Colon cancer Neg Hx   . Esophageal cancer Neg Hx   . Stomach cancer Neg Hx     Social History   Social History  . Marital status: Married    Spouse name: N/A  . Number of children: 2  . Years of education: phd    Occupational History  . 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 Smoking status: Former Smoker    Quit date: 02/05/1994  . Smokeless tobacco: Never Used  . Alcohol use No  . Drug use: No  . Sexual activity: Not Asked   Other Topics Concern  . 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    No firearms    NO reg exercise    Pets- Poodles 2    Sleep 7 hours     Outpatient Medications Prior to Visit  Medication Sig Dispense Refill  . aspirin 81 MG tablet Take 81 mg by mouth daily.      Marland Kitchen atorvastatin (LIPITOR) 80 MG tablet TAKE 1 TABLET(80 MG) BY MOUTH DAILY 90 tablet 3  . BAYER MICROLET LANCETS lancets 100 each by Other route 2 (two) times daily. Use as instructed 100 each 11  . ezetimibe (ZETIA) 10 MG tablet Take 1 tablet (10 mg total) by mouth daily. 90 tablet 3  . glucose blood (BAYER CONTOUR NEXT TEST) test strip Use as instructed twice daily 100 each 11  . Insulin Pen Needle (B-D UF III MINI PEN NEEDLES) 31G X 5 MM MISC USE AS DIRECTED. 300 each 3  . INVOKANA 300 MG TABS tablet TAKE 1 TABLET(300 MG) BY MOUTH DAILY BEFORE BREAKFAST 30 tablet 5  . metFORMIN (GLUCOPHAGE-XR) 500 MG 24 hr tablet TAKE 2 TABLETS  BY MOUTH TWICE DAILY WITH MEALS 360 tablet 1  . Multiple Vitamins-Minerals (MULTIVITAMIN PO) Take by mouth daily.    Marland Kitchen omeprazole (PRILOSEC) 20 MG capsule TAKE 1 CAPSULE BY MOUTH EVERY DAY 90 capsule 1  . VICTOZA 18 MG/3ML SOPN ADMINISTER 1.2 MG UNDER THE SKIN DAILY (Patient taking differently: ADMINISTER 1.8 MG UNDER THE SKIN DAILY) 2 pen 3  . clindamycin (CLEOCIN) 150 MG capsule Take by mouth. 4 tabs prior to dental work      No facility-administered medications prior to visit.      EXAM:  BP (!) 150/78 (BP Location: Right Arm, Patient Position: Sitting, Cuff Size: Normal)   Temp 97.8 F (36.6 C) (Oral)   Wt 188 lb 3.2 oz (85.4 kg)   BMI 31.32 kg/m   Body mass index is 31.32 kg/m.  GENERAL: vitals reviewed and listed above, alert, oriented, appears well hydrated and in no acute distressShe have some obvious redness in the right cheek area. No. Orbital edema. There is some mild mass effect but no fluctuance there is a center spot but no discharge. OP is clear no adenopathy. Skin on chest hat she flaky follicular plaque like in the sun exposed areas. Reviewed laboratory studies with patient as well as her thyroid ultrasound. MS: moves all extremities without noticeable focal  abnormality PSYCH: pleasant and cooperative, no obvious depression or anxiety Lab Results  Component Value Date   WBC 7.0 12/12/2015   HGB 12.3 12/12/2015   HCT 37.8 12/12/2015   PLT 268.0 12/12/2015   GLUCOSE 155 (H) 08/19/2015   CHOL 159 08/19/2015   TRIG 78.0 08/19/2015   HDL 42.70 08/19/2015   LDLCALC 101 (H) 08/19/2015   ALT 24 12/12/2015   AST 18 12/12/2015   NA 137 08/19/2015   K 4.3 08/19/2015   CL 99 08/19/2015   CREATININE 0.74 08/19/2015   BUN 10 08/19/2015   CO2 27 08/19/2015   TSH 3.48 12/12/2015   HGBA1C 8.1 (H) 12/12/2015   MICROALBUR <0.7 08/25/2015    IMPRESSION: 1. Borderline enlarged, moderately heterogeneous thyroid without discrete nodule or other focal  lesion. 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 - no nodule seen on  [place]  follow clinical exam  Diabetes is worsening control is aware will go up to 1.8 of thick toes at. Would like to try Trulicity when she goes on to Medicare see if there are more options for medication management. Priority today is the redness swelling probable infection on her right cheek. She will begin doxycycline warm compresses minimal irritation if needed we may have to switch to clindamycin. Thyroid enlargement not clinically significant to her health and no acute nodules. -Patient advised to return or notify health care team  if symptoms worsen ,persist or new concerns arise.  Patient Instructions  Thyroid ok   Follow exam  .  Chest rash could be   Topical reaction .   Antibiotic   Doxycycline  For 10 day . thisis  A skin infection  Expect improvement in the next 48 - 72 hours if progressing may need to  reasses or change treatment .       Standley Brooking. Panosh M.D.

## 2015-12-21 NOTE — Telephone Encounter (Signed)
Pt seen in [place] today for rash and breakout.  See ov note.

## 2015-12-23 ENCOUNTER — Ambulatory Visit: Payer: Self-pay | Admitting: Internal Medicine

## 2015-12-23 ENCOUNTER — Other Ambulatory Visit: Payer: Self-pay | Admitting: Internal Medicine

## 2015-12-26 NOTE — Telephone Encounter (Signed)
Not filled by [facility] in the past.  Will send for authorization.

## 2015-12-27 NOTE — Telephone Encounter (Signed)
Left a message for a return call.

## 2015-12-27 NOTE — Telephone Encounter (Signed)
Please ask document  Who has been rx ing this med  And the indication for antibiotic before the dental work .   Before    Agreeing to take over the rx .   Also how is the  Face infection doing?

## 2015-12-28 NOTE — Telephone Encounter (Signed)
Left a message for a return call.

## 2016-01-02 ENCOUNTER — Ambulatory Visit: Payer: Self-pay | Admitting: Internal Medicine

## 2016-01-02 NOTE — Telephone Encounter (Signed)
Tried to reach the pt by telephone.  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  Will now refuse rx.

## 2016-01-15 ENCOUNTER — Other Ambulatory Visit: Payer: Self-pay | Admitting: Internal Medicine

## 2016-01-17 NOTE — Telephone Encounter (Signed)
Sent to the pharmacy by e-scribe for 3 months.  Filled for 6 months in July.  Pt has upcoming cpx on 04/21/15.

## 2016-02-04 ENCOUNTER — Other Ambulatory Visit: Payer: Self-pay | Admitting: Internal Medicine

## 2016-02-07 NOTE — Telephone Encounter (Signed)
Denied.  Spoke to Victor at the pharmacy and pt has refills on file.  He will fill prescription for patient.

## 2016-02-10 ENCOUNTER — Encounter: Payer: Self-pay | Admitting: Internal Medicine

## 2016-02-10 MED ORDER — DOXYCYCLINE HYCLATE 100 MG PO TABS: 100.0000 mg | ORAL_TABLET | Freq: Two times a day (BID) | ORAL | 0 refills | Status: DC

## 2016-02-10 NOTE — Telephone Encounter (Signed)
Please send in doxycycline 100 mg 1 po bid for 7 days  Disp 14

## 2016-03-12 ENCOUNTER — Other Ambulatory Visit: Payer: Self-pay | Admitting: Internal Medicine

## 2016-03-12 DIAGNOSIS — G8929 Other chronic pain: Secondary | ICD-10-CM | POA: Diagnosis not present

## 2016-03-12 DIAGNOSIS — M5441 Lumbago with sciatica, right side: Secondary | ICD-10-CM | POA: Diagnosis not present

## 2016-03-14 NOTE — Telephone Encounter (Signed)
Sent to the pharmacy.  Pt has upcoming cpx on 04/20/16

## 2016-04-02 DIAGNOSIS — M5441 Lumbago with sciatica, right side: Secondary | ICD-10-CM | POA: Diagnosis not present

## 2016-04-02 DIAGNOSIS — G8929 Other chronic pain: Secondary | ICD-10-CM | POA: Diagnosis not present

## 2016-04-07 DIAGNOSIS — E119 Type 2 diabetes mellitus without complications: Secondary | ICD-10-CM | POA: Diagnosis not present

## 2016-04-07 LAB — HM DIABETES EYE EXAM

## 2016-04-09 ENCOUNTER — Telehealth: Payer: Self-pay | Admitting: Internal Medicine

## 2016-04-09 NOTE — Telephone Encounter (Signed)
Pt had to cancel her upcoming cpe on 3/9.  Pt states with her schedule, she can not do anything except an 8:15 am appt. The first 8:15 am you have is Monday 07/09/2016.  Is it ok to do her cpe that day? Pt states this is the first available time she can come.

## 2016-04-09 NOTE — Telephone Encounter (Signed)
Yes ok but have her let us know if diabetes  Is out of control before then

## 2016-04-09 NOTE — Telephone Encounter (Signed)
Left message on confidential VM with the information below.

## 2016-04-10 ENCOUNTER — Encounter: Payer: Self-pay | Admitting: Internal Medicine

## 2016-04-13 ENCOUNTER — Other Ambulatory Visit: Payer: Self-pay

## 2016-04-20 ENCOUNTER — Encounter: Payer: Self-pay | Admitting: Internal Medicine

## 2016-05-01 ENCOUNTER — Other Ambulatory Visit: Payer: Self-pay | Admitting: Internal Medicine

## 2016-05-02 ENCOUNTER — Telehealth: Payer: Self-pay | Admitting: Emergency Medicine

## 2016-05-02 NOTE — Telephone Encounter (Signed)
Left voicemail for pt just checking to see how her readings are going with her blood sugar. I sent 3 months work of metformin just to get through to her upcoming appointment in 07/2016.

## 2016-05-14 DIAGNOSIS — M5441 Lumbago with sciatica, right side: Secondary | ICD-10-CM | POA: Diagnosis not present

## 2016-05-14 DIAGNOSIS — G8929 Other chronic pain: Secondary | ICD-10-CM | POA: Diagnosis not present

## 2016-05-30 DIAGNOSIS — G8929 Other chronic pain: Secondary | ICD-10-CM | POA: Diagnosis not present

## 2016-05-30 DIAGNOSIS — M5441 Lumbago with sciatica, right side: Secondary | ICD-10-CM | POA: Diagnosis not present

## 2016-06-07 ENCOUNTER — Other Ambulatory Visit: Payer: Self-pay | Admitting: Internal Medicine

## 2016-06-08 ENCOUNTER — Other Ambulatory Visit: Payer: Self-pay | Admitting: Internal Medicine

## 2016-06-08 DIAGNOSIS — M5441 Lumbago with sciatica, right side: Secondary | ICD-10-CM | POA: Diagnosis not present

## 2016-06-08 DIAGNOSIS — G8929 Other chronic pain: Secondary | ICD-10-CM | POA: Diagnosis not present

## 2016-06-12 ENCOUNTER — Other Ambulatory Visit: Payer: Self-pay | Admitting: Internal Medicine

## 2016-06-13 NOTE — Telephone Encounter (Signed)
Left a message for a return call or have the pt send a MyChart message.

## 2016-06-15 NOTE — Telephone Encounter (Signed)
Left a VM for pt to give the office a call back  

## 2016-06-27 NOTE — Telephone Encounter (Signed)
No further information  Closing sending back document

## 2016-06-28 NOTE — Telephone Encounter (Signed)
Left a VM fo pt to give the office a call back

## 2016-07-06 ENCOUNTER — Other Ambulatory Visit: Payer: PPO

## 2016-07-06 ENCOUNTER — Other Ambulatory Visit: Payer: Self-pay | Admitting: Internal Medicine

## 2016-07-06 DIAGNOSIS — E785 Hyperlipidemia, unspecified: Secondary | ICD-10-CM

## 2016-07-06 DIAGNOSIS — K219 Gastro-esophageal reflux disease without esophagitis: Secondary | ICD-10-CM

## 2016-07-06 DIAGNOSIS — I2510 Atherosclerotic heart disease of native coronary artery without angina pectoris: Secondary | ICD-10-CM

## 2016-07-06 DIAGNOSIS — E119 Type 2 diabetes mellitus without complications: Secondary | ICD-10-CM

## 2016-07-06 NOTE — Progress Notes (Signed)
Ask to emergently puorder labs because patient is her for lab apppatient legft anyway

## 2016-07-08 NOTE — Progress Notes (Signed)
Chief Complaint  Patient presents with  . Annual Exam    HPI: Morgan [name] 66 y.o. comes in today for Preventive Welcome to Medicare wellness visit .Since last visit. And Chronic disease management  DM  Better recently    120 - 170  Has lost weight  On victoza invokanna and  Metformin  HT  Ok  LIPIDS on meds  Sees dr Stanford Breed yearly  Right back acting up  Getting better saw  Ortho non surgical problem    Health Maintenance  Topic Date Due  . MAMMOGRAM  12/12/2015  . PNA vac Low Risk Adult (1 of 2 - PCV13) 04/02/2016  . FOOT EXAM  04/28/2016  . HEMOGLOBIN A1C  06/10/2016  . HIV Screening  07/09/2017 (Originally 04/02/1965)  . URINE MICROALBUMIN  08/24/2016  . INFLUENZA VACCINE  09/05/2016  . OPHTHALMOLOGY EXAM  04/07/2017  . PAP SMEAR  07/29/2017  . COLONOSCOPY  05/14/2020  . TETANUS/TDAP  09/23/2024  . DEXA SCAN  Completed  . Hepatitis C Screening  Completed   Health Maintenance Review LIFESTYLE:  TAD no etoh  No no Sugar beverages: no Sleep: better  6- 9  Work 35 - 40  Exercise :     Not a lot since   Water  Damage  Re do .   Hearing:   No change  Glasses .    Vision:  No limitations at present . Last eye check UTD early cataracts.  No dm   Safety:  Has smoke detector and wears seat belts.  No firearms. No excess sun exposure. Sees dentist regularly.  Falls:     Advance directive :   Has one   Memory: Felt to be good  , no concern from her or her family.  Depression: No anhedonia unusual crying or depressive symptoms  Nutrition: Eats well balanced diet; adequate calcium and vitamin D. No swallowing chewing problems.  Injury: no major injuries in the last six months.  Other healthcare providers:  Reviewed today .  Social:  Lives with  apt . Pets poodles   Preventive parameters: up-to-date  Reviewed   ADLS:   There are no problems or need for assistance  driving, feeding, obtaining food, dressing, toileting and bathing, managing money using phone.  She is independent.    ROS:  2 weeks  Had slight unsteady gait  And not vertigo  Temporarily .  pinple r nose has med for rosacea not using  GEN/ HEENT: No fever, significant weight changes sweats headaches vision problems hearing changes, CV/ PULM; No chest pain shortness of breath cough, syncope,edema  change in exercise tolerance. GI /GU: No adominal pain, vomiting, change in bowel habits. No blood in the stool. No significant GU symptoms. SKIN/HEME: ,no acute skin rashes suspicious lesions or bleeding. No lymphadenopathy, nodules, masses.  NEURO/ PSYCH:  No neurologic signs such as weakness numbness. No depression anxiety. IMM/ Allergy: No unusual infections.  Allergy .   REST of 12 system review negative except as per HPI   Past Medical History:  Diagnosis Date  . Anemia    as a child   . CAD, NATIVE VESSEL 05/13/2009  . DIABETES MELLITUS, TYPE II 12/09/2009  . Gastric ulcer   . Gastritis   . GERD (gastroesophageal reflux disease)   . Hx of abnormal cervical Pap smear    cryo  but normal since then   . HYPERLIPIDEMIA-MIXED 04/14/2008  . Intestinal metaplasia of gastric mucosa 2012  . Myocardial infarction ([place])  2003  . Obesity   . Occult blood in stools   . Osteopenia   . OVERWEIGHT/OBESITY 05/13/2009  . Ulcer    Duodenal ulcer by x-ray 40 years ago  . Varicose veins    under rx lawson 2016    Family History  Problem Relation Age of Onset  . Emphysema Mother        Smoker  . Heart attack Mother        due to head injury  . Alcohol abuse Mother   . Heart disease Mother   . Early death Mother   . Varicose Veins Mother   . AAA (abdominal aortic aneurysm) Mother   . Lung cancer Father   . Heart attack Father   . Hypertension Father   . Alcohol abuse Father   . Heart disease Father        before age 70  . Aneurysm Father   . Hyperlipidemia Father   . Alcohol abuse Brother   . Diabetes Brother   . Heart disease Brother   . Heart attack Brother   . Diabetes  Brother   . Aneurysm Paternal Grandmother   . Diabetes Unknown        grandfather  . Sudden death Brother        scd  . Colon cancer Neg Hx   . Esophageal cancer Neg Hx   . Stomach cancer Neg Hx     Social History   Social History  . Marital status: Married    Spouse name: N/A  . Number of children: 2  . Years of education: phd    Occupational History  . 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 Smoking status: Former Smoker    Quit date: 02/05/1994  . Smokeless tobacco: Never Used  . Alcohol use No  . Drug use: No  . Sexual activity: Not Asked   Other Topics Concern  . 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    No firearms    NO reg exercise    Pets- Poodles 2    Sleep 7 hours     Outpatient Encounter Prescriptions as of 07/09/2016  Medication Sig  . aspirin 81 MG tablet Take 81 mg by mouth daily.    Marland Kitchen atorvastatin (LIPITOR) 80 MG tablet TAKE 1 TABLET(80 MG) BY MOUTH DAILY  . BAYER MICROLET LANCETS lancets 100 each by Other route 2 (two) times daily. Use as instructed  . clindamycin (CLEOCIN) 150 MG capsule Take by mouth. 4 tabs prior to dental work   . Cyanocobalamin (B-12 PO) Take by mouth.  . doxycycline (VIBRA-TABS) 100 MG tablet Take 1 tablet (100 mg total) by mouth 2 (two) times daily.  Marland Kitchen ezetimibe (ZETIA) 10 MG tablet Take 1 tablet (10 mg total) by mouth daily.  . Ferrous Sulfate (IRON) 325 (65 Fe) MG TABS Take 1 tablet by mouth daily.  Marland Kitchen glucose blood (BAYER CONTOUR NEXT TEST) test strip Use as instructed twice daily  . Insulin Pen Needle (B-D UF III MINI PEN NEEDLES) 31G X 5 MM MISC USE AS  DIRECTED.  Marland Kitchen INVOKANA 300 MG TABS tablet TAKE 1 TABLET(300 MG) BY MOUTH DAILY BEFORE  BREAKFAST  . metFORMIN (GLUCOPHAGE-XR) 500 MG 24 hr tablet TAKE 2 TABLETS BY MOUTH TWICE DAILY WITH MEALS  . Multiple Vitamins-Minerals (MULTIVITAMIN PO) Take by mouth daily.  Marland Kitchen omeprazole (PRILOSEC) 20 MG capsule TAKE 1 CAPSULE BY MOUTH EVERY DAY  . VICTOZA 18 MG/3ML SOPN ADMINISTER 1.8 MG UNDER THE SKIN DAILY  . [DISCONTINUED] doxycycline (VIBRA-TABS) 100 MG tablet Take 1 tablet (100 mg total) by mouth 2 (two) times daily.  . [DISCONTINUED] INVOKANA 100 MG TABS tablet TAKE 1 TABLET BY MOUTH EVERY DAY  . [DISCONTINUED] VICTOZA 18 MG/3ML SOPN ADMINISTER 1.8 MG UNDER THE SKIN DAILY   No facility-administered encounter medications on file as of 07/09/2016.     EXAM:  BP 120/70 (BP Location: Right Arm, Patient Position: Sitting, Cuff Size: Normal)   Pulse 65   Temp 97.4 F (36.3 C) (Oral)   Ht '5\' 5"'  (1.651 m)   Wt 175 lb (79.4 kg)   BMI 29.12 kg/m   Body mass index is 29.12 kg/m.  Physical Exam: Vital signs reviewed NWG:NFAO is a well-developed well-nourished alert cooperative   who appears stated age in no acute distress.  HEENT: normocephalic atraumatic , Eyes: PERRL EOM's full, conjunctiva clear, Nares: paten,t no deformity discharge or tenderness., Ears: no deformity EAC's clear TMs with normal landmarks. Mouth: clear OP, no lesions, edema.  Moist mucous membranes. Dentition in adequate repair. NECK: supple without masses, thyromegaly or bruits. CHEST/PULM:  Clear to auscultation and percussion breath sounds equal no wheeze , rales or rhonchi. No chest wall deformities or tenderness. CV: PMI is nondisplaced, S1 S2 no gallops, murmurs, rubs. Peripheral pulses are full without delay.No JVD .  ABDOMEN: Bowel sounds normal nontender  No guard or rebound, no hepato splenomegal no CVA tenderness.  Breast: normal by inspection . No dimpling, discharge, masses, tenderness or discharge . Extremtities:  No clubbing cyanosis or edema, no acute joint swelling or redness no focal atrophy NEURO:   Oriented x3, cranial nerves 3-12 appear to be intact, no obvious focal weakness,gait within normal limits no abnormal reflexes or asymmetrical SKIN: No acute rashes normal turgor, color, no bruising or petechiae. smal papul lef nlfold  PSYCH: Oriented, good eye contact, no obvious depression anxiety, cognition and judgment appear normal. LN: no cervical axillary inguinal adenopathy No noted deficits in memory, attention, and speech. Diabetic Foot Exam - Simple   Simple Foot Form Diabetic Foot exam was performed with the following findings:  Yes 07/09/2016  8:41 AM  Visual Inspection No deformities, no ulcerations, no other skin breakdown bilaterally:  Yes Sensation Testing Intact to touch and monofilament testing bilaterally:  Yes Pulse Check Posterior Tibialis and Dorsalis pulse intact bilaterally:  Yes Comments      Lab Results  Component Value Date   WBC 6.8 07/09/2016   HGB 13.7 07/09/2016   HCT 40.7 07/09/2016   PLT 221.0 07/09/2016   GLUCOSE 144 (H) 07/09/2016   CHOL 141 07/09/2016   TRIG 117.0 07/09/2016   HDL 40.10 07/09/2016   LDLCALC 78 07/09/2016   ALT 21 07/09/2016   AST 15 07/09/2016   NA 136 07/09/2016   K 4.2 07/09/2016   CL 100 07/09/2016   CREATININE 0.67 07/09/2016   BUN 11 07/09/2016   CO2 28 07/09/2016   TSH 4.55 (H) 07/09/2016   HGBA1C 7.8 (H) 07/09/2016   MICROALBUR <0.7 07/09/2016   Wt Readings from Last 3 Encounters:  07/09/16 175 lb (79.4 kg)  12/21/15 188 lb 3.2 oz (85.4 kg)  12/12/15 185 lb 6.4 oz (84.1 kg)    ASSESSMENT AND PLAN:  Discussed the following assessment and plan:  Visit for preventive health examination  Medication management  Diabetes mellitus without complication ([place]) - Plan: Microalbumin / creatinine urine ratio, Basic metabolic panel, Hemoglobin A1c, Hepatic function panel, Lipid panel, TSH, CBC with Differential/Platelet  Atherosclerosis of native coronary artery of native heart without angina pectoris - Plan: Basic  metabolic panel, Hemoglobin A1c, Hepatic function panel, Lipid panel, TSH, CBC with Differential/Platelet  Hyperlipidemia, unspecified hyperlipidemia type - Plan: Basic metabolic panel, Hemoglobin A1c, Hepatic function panel, Lipid panel, TSH, CBC with Differential/Platelet  GERD without esophagitis - Plan: Basic metabolic panel, Hemoglobin A1c, Hepatic function panel, Lipid panel, TSH, CBC with Differential/Platelet  Rosacea hx of - amy Martinique  rx as needed topical?  Need for prophylactic vaccination against Streptococcus pneumoniae (pneumococcus) - Plan: Pneumococcal conjugate vaccine 13-valent IM Add urine microalbumin  When du want to see dr Silverio Decamp  Reviewed   Above concerns  See  instru c. HCM reviewed  Patient Care Team: Deyjah Kindel, Standley Brooking, MD as PCP - General (Internal Medicine) Martinique, Amy, MD as Consulting Physician (Dermatology) Dian Queen, MD as Attending Physician (Obstetrics and Gynecology) Latanya Maudlin, MD as Consulting Physician (Orthopedic Surgery)  Patient Instructions     Gentle  exercise  Helpful for  Balance    considier  Tai chi and back exercise   Consider seeing   Amy Martinique.  If  Bumps keep coming but take your rosacea topical     Will notify you  of labs when available.  Healthy lifestyle includes : At least 150 minutes of exercise weeks  , weight at healthy levels, which is usually   BMI 19-25. Avoid trans fats and processed foods;  Increase fresh fruits and veges to 5 servings per day. And avoid sweet beverages including tea and juice. Mediterranean diet with olive oil and nuts have been noted to be heart and brain healthy . Avoid tobacco products . Limit  alcohol to  7 per week for women and 14 servings for men.  Get adequate sleep . Wear seat belts . Don't text and drive .           Standley Brooking. Marvena Tally M.D.

## 2016-07-09 ENCOUNTER — Other Ambulatory Visit: Payer: Self-pay | Admitting: Internal Medicine

## 2016-07-09 ENCOUNTER — Encounter: Payer: Self-pay | Admitting: Internal Medicine

## 2016-07-09 ENCOUNTER — Ambulatory Visit (INDEPENDENT_AMBULATORY_CARE_PROVIDER_SITE_OTHER): Payer: PPO | Admitting: Internal Medicine

## 2016-07-09 VITALS — BP 120/70 | HR 65 | Temp 97.4°F | Ht 65.0 in | Wt 175.0 lb

## 2016-07-09 DIAGNOSIS — E119 Type 2 diabetes mellitus without complications: Secondary | ICD-10-CM

## 2016-07-09 DIAGNOSIS — Z79899 Other long term (current) drug therapy: Secondary | ICD-10-CM | POA: Diagnosis not present

## 2016-07-09 DIAGNOSIS — I2510 Atherosclerotic heart disease of native coronary artery without angina pectoris: Secondary | ICD-10-CM

## 2016-07-09 DIAGNOSIS — L719 Rosacea, unspecified: Secondary | ICD-10-CM | POA: Diagnosis not present

## 2016-07-09 DIAGNOSIS — Z0000 Encounter for general adult medical examination without abnormal findings: Secondary | ICD-10-CM

## 2016-07-09 DIAGNOSIS — K219 Gastro-esophageal reflux disease without esophagitis: Secondary | ICD-10-CM | POA: Diagnosis not present

## 2016-07-09 DIAGNOSIS — Z23 Encounter for immunization: Secondary | ICD-10-CM

## 2016-07-09 DIAGNOSIS — E785 Hyperlipidemia, unspecified: Secondary | ICD-10-CM

## 2016-07-09 LAB — CBC WITH DIFFERENTIAL/PLATELET
Basophils Absolute: 0 10*3/uL (ref 0.0–0.1)
Basophils Relative: 0.5 % (ref 0.0–3.0)
EOS PCT: 0.6 % (ref 0.0–5.0)
Eosinophils Absolute: 0 10*3/uL (ref 0.0–0.7)
HEMATOCRIT: 40.7 % (ref 36.0–46.0)
HEMOGLOBIN: 13.7 g/dL (ref 12.0–15.0)
LYMPHS PCT: 25.6 % (ref 12.0–46.0)
Lymphs Abs: 1.7 10*3/uL (ref 0.7–4.0)
MCHC: 33.7 g/dL (ref 30.0–36.0)
MCV: 85.6 fl (ref 78.0–100.0)
MONO ABS: 0.4 10*3/uL (ref 0.1–1.0)
MONOS PCT: 5.8 % (ref 3.0–12.0)
Neutro Abs: 4.6 10*3/uL (ref 1.4–7.7)
Neutrophils Relative %: 67.5 % (ref 43.0–77.0)
Platelets: 221 10*3/uL (ref 150.0–400.0)
RBC: 4.75 Mil/uL (ref 3.87–5.11)
RDW: 15.9 % — ABNORMAL HIGH (ref 11.5–15.5)
WBC: 6.8 10*3/uL (ref 4.0–10.5)

## 2016-07-09 LAB — MICROALBUMIN / CREATININE URINE RATIO
Creatinine,U: 21.4 mg/dL
Microalb Creat Ratio: 3.3 mg/g (ref 0.0–30.0)
Microalb, Ur: <0.7 mg/dL (ref 0.0–1.9)

## 2016-07-09 LAB — LIPID PANEL
CHOLESTEROL: 141 mg/dL (ref 0–200)
HDL: 40.1 mg/dL (ref >39.00)
LDL Cholesterol: 78 mg/dL (ref 0–99)
NonHDL: 101.25
TRIGLYCERIDES: 117 mg/dL (ref 0.0–149.0)
Total CHOL/HDL Ratio: 4
VLDL: 23.4 mg/dL (ref 0.0–40.0)

## 2016-07-09 LAB — BASIC METABOLIC PANEL
BUN: 11 mg/dL (ref 6–23)
CHLORIDE: 100 meq/L (ref 96–112)
CO2: 28 meq/L (ref 19–32)
CREATININE: 0.67 mg/dL (ref 0.40–1.20)
Calcium: 9.6 mg/dL (ref 8.4–10.5)
GFR: 93.81 mL/min (ref >60.00)
Glucose, Bld: 144 mg/dL — ABNORMAL HIGH (ref 70–99)
Potassium: 4.2 mEq/L (ref 3.5–5.1)
Sodium: 136 mEq/L (ref 135–145)

## 2016-07-09 LAB — HEPATIC FUNCTION PANEL
ALBUMIN: 4.5 g/dL (ref 3.5–5.2)
ALT: 21 U/L (ref 0–35)
AST: 15 U/L (ref 0–37)
Alkaline Phosphatase: 67 U/L (ref 39–117)
Bilirubin, Direct: 0.2 mg/dL (ref 0.0–0.3)
TOTAL PROTEIN: 6.4 g/dL (ref 6.0–8.3)
Total Bilirubin: 0.7 mg/dL (ref 0.2–1.2)

## 2016-07-09 LAB — HEMOGLOBIN A1C: Hgb A1c MFr Bld: 7.8 % — ABNORMAL HIGH (ref 4.6–6.5)

## 2016-07-09 LAB — TSH: TSH: 4.55 u[IU]/mL — AB (ref 0.35–4.50)

## 2016-07-09 MED ORDER — GLUCOSE BLOOD VI STRP: ORAL_STRIP | 12 refills | Status: DC

## 2016-07-09 MED ORDER — ONETOUCH ULTRASOFT LANCETS MISC: 12 refills | Status: DC

## 2016-07-09 NOTE — Addendum Note (Signed)
Addended by: Milford Cage on: 07/09/2016 02:16 PM   Modules accepted: Orders

## 2016-07-09 NOTE — Patient Instructions (Addendum)
    Gentle  exercise  Helpful for  Balance    considier  Tai chi and back exercise   Consider seeing   Amy [place].  If  Bumps keep coming but take your rosacea topical     Will notify you  of labs when available.  Healthy lifestyle includes : At least 150 minutes of exercise weeks  , weight at healthy levels, which is usually   BMI 19-25. Avoid trans fats and processed foods;  Increase fresh fruits and veges to 5 servings per day. And avoid sweet beverages including tea and juice. Mediterranean diet with olive oil and nuts have been noted to be heart and brain healthy . Avoid tobacco products . Limit  alcohol to  7 per week for women and 14 servings for men.  Get adequate sleep . Wear seat belts . Don't text and drive .

## 2016-07-12 ENCOUNTER — Other Ambulatory Visit: Payer: Self-pay | Admitting: Internal Medicine

## 2016-07-13 ENCOUNTER — Other Ambulatory Visit: Payer: Self-pay | Admitting: Emergency Medicine

## 2016-07-13 DIAGNOSIS — R7989 Other specified abnormal findings of blood chemistry: Secondary | ICD-10-CM

## 2016-07-24 ENCOUNTER — Other Ambulatory Visit: Payer: Self-pay | Admitting: Internal Medicine

## 2016-08-05 ENCOUNTER — Other Ambulatory Visit: Payer: Self-pay | Admitting: Internal Medicine

## 2016-08-06 NOTE — Telephone Encounter (Signed)
lmom for pt to call back

## 2016-08-06 NOTE — Telephone Encounter (Signed)
Patient needs to schedule an appointment for repeat labs. Lab orders have already been ordered and I sent in a refills for Victoza. Please help patient schedule. Thank you

## 2016-08-09 NOTE — Telephone Encounter (Signed)
lmom for pt to call back

## 2016-08-13 NOTE — Telephone Encounter (Signed)
lmom for pt to call back

## 2016-08-24 ENCOUNTER — Other Ambulatory Visit (INDEPENDENT_AMBULATORY_CARE_PROVIDER_SITE_OTHER): Payer: PPO

## 2016-08-24 DIAGNOSIS — R7989 Other specified abnormal findings of blood chemistry: Secondary | ICD-10-CM

## 2016-08-24 DIAGNOSIS — R946 Abnormal results of thyroid function studies: Secondary | ICD-10-CM

## 2016-08-24 LAB — T4, FREE: Free T4: 0.86 ng/dL (ref 0.60–1.60)

## 2016-08-24 LAB — TSH: TSH: 3.1 u[IU]/mL (ref 0.35–4.50)

## 2016-09-12 DIAGNOSIS — N958 Other specified menopausal and perimenopausal disorders: Secondary | ICD-10-CM | POA: Diagnosis not present

## 2016-10-14 ENCOUNTER — Other Ambulatory Visit: Payer: Self-pay | Admitting: Internal Medicine

## 2016-10-17 ENCOUNTER — Other Ambulatory Visit: Payer: Self-pay | Admitting: Internal Medicine

## 2016-10-18 NOTE — Telephone Encounter (Signed)
Ok to refill x 6 months 

## 2016-10-19 ENCOUNTER — Other Ambulatory Visit: Payer: Self-pay | Admitting: Emergency Medicine

## 2016-10-26 ENCOUNTER — Encounter: Payer: Self-pay | Admitting: Internal Medicine

## 2016-11-05 DIAGNOSIS — D224 Melanocytic nevi of scalp and neck: Secondary | ICD-10-CM | POA: Diagnosis not present

## 2016-11-05 DIAGNOSIS — L738 Other specified follicular disorders: Secondary | ICD-10-CM | POA: Diagnosis not present

## 2016-11-05 DIAGNOSIS — D2272 Melanocytic nevi of left lower limb, including hip: Secondary | ICD-10-CM | POA: Diagnosis not present

## 2016-11-05 DIAGNOSIS — D692 Other nonthrombocytopenic purpura: Secondary | ICD-10-CM | POA: Diagnosis not present

## 2016-11-05 DIAGNOSIS — L821 Other seborrheic keratosis: Secondary | ICD-10-CM | POA: Diagnosis not present

## 2016-11-05 DIAGNOSIS — D225 Melanocytic nevi of trunk: Secondary | ICD-10-CM | POA: Diagnosis not present

## 2016-11-05 DIAGNOSIS — D2262 Melanocytic nevi of left upper limb, including shoulder: Secondary | ICD-10-CM | POA: Diagnosis not present

## 2016-11-14 ENCOUNTER — Other Ambulatory Visit: Payer: Self-pay | Admitting: Cardiology

## 2016-11-15 ENCOUNTER — Other Ambulatory Visit: Payer: Self-pay | Admitting: Cardiology

## 2016-11-15 ENCOUNTER — Encounter: Payer: Self-pay | Admitting: *Deleted

## 2016-11-15 NOTE — Telephone Encounter (Signed)
REFILL 

## 2016-11-23 NOTE — Progress Notes (Signed)
HPI: FU CAD. Previous PCI of her RCA following myocardial infarction in 2003. Echocardiogram in 2004 showed normal LV function. Abd ultrasound 11/15 showed no aneurysm. Nuclear study 9/16 showed EF 57 and inability to evaluate inferior wall but otherwise normal perfusion. Carotid Dopplers October 2017 showed 1-39% stenosis. There is note of abnormal thyroid tissue and dedicated thyroid ultrasound suggested. Thyroid ultrasound November 2017 showed borderline enlargement with moderately heterogeneous thyroid with no discrete nodule. Seen last seen, the patient denies any dyspnea on exertion, orthopnea, PND, pedal edema, palpitations, syncope or chest pain.   Current Outpatient Prescriptions  Medication Sig Dispense Refill  . aspirin 81 MG tablet Take 81 mg by mouth daily.      Marland Kitchen atorvastatin (LIPITOR) 80 MG tablet Take 1 tablet (80 mg total) by mouth daily at 6 PM. KEEP OV. 90 tablet 0  . BAYER MICROLET LANCETS lancets 100 each by Other route 2 (two) times daily. Use as instructed 100 each 11  . clindamycin (CLEOCIN) 150 MG capsule Take by mouth. 4 tabs prior to dental work     . Cyanocobalamin (B-12 PO) Take by mouth.    . ezetimibe (ZETIA) 10 MG tablet TAKE 1 TABLET(10 MG) BY MOUTH DAILY 90 tablet 0  . Ferrous Sulfate (IRON) 325 (65 Fe) MG TABS Take 1 tablet by mouth daily.    Marland Kitchen glucose blood (BAYER CONTOUR NEXT TEST) test strip Use as instructed twice daily 100 each 11  . glucose blood test strip Use as instructed 100 each 12  . Insulin Pen Needle (B-D UF III MINI PEN NEEDLES) 31G X 5 MM MISC USE AS DIRECTED. 300 each 3  . INVOKANA 300 MG TABS tablet TAKE 1 TABLET(300 MG) BY MOUTH DAILY BEFORE BREAKFAST 30 tablet 5  . metFORMIN (GLUCOPHAGE-XR) 500 MG 24 hr tablet TAKE 2 TABLETS BY MOUTH TWICE DAILY WITH MEALS 360 tablet 1  . Multiple Vitamins-Minerals (MULTIVITAMIN PO) Take by mouth daily.    Marland Kitchen omeprazole (PRILOSEC) 20 MG capsule TAKE 1 CAPSULE BY MOUTH EVERY DAY 30 capsule 5  . VICTOZA  18 MG/3ML SOPN ADMINISTER 1.8 MG UNDER THE SKIN DAILY 5 pen 5   No current facility-administered medications for this visit.      Past Medical History:  Diagnosis Date  . Anemia    as a child   . CAD, NATIVE VESSEL 05/13/2009  . DIABETES MELLITUS, TYPE II 12/09/2009  . Gastric ulcer   . Gastritis   . GERD (gastroesophageal reflux disease)   . Hx of abnormal cervical Pap smear    cryo  but normal since then   . HYPERLIPIDEMIA-MIXED 04/14/2008  . Intestinal metaplasia of gastric mucosa 2012  . Myocardial infarction ([place])    2003  . Obesity   . Occult blood in stools   . Osteopenia   . OVERWEIGHT/OBESITY 05/13/2009  . Ulcer    Duodenal ulcer by x-ray 40 years ago  . Varicose veins    under rx lawson 2016    Past Surgical History:  Procedure Laterality Date  . CESAREAN SECTION     x2  . CLOSED REDUCTION PROXIMAL TIBIOFIBULAR JOINT [place]  2004   left  . COLONOSCOPY    . DILATION AND CURETTAGE OF UTERUS    . ECTOPIC PREGNANCY SURGERY    . FIBULA FRACTURE SURGERY    . GANGLION CYST EXCISION    . TONSILLECTOMY    . TUBAL LIGATION     One Tube  . UPPER GASTROINTESTINAL ENDOSCOPY  Social History   Social History  . Marital status: Married    Spouse name: N/A  . Number of children: 2  . Years of education: phd    Occupational History  . 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 Smoking status: Former Smoker    Quit date: 02/05/1994  . Smokeless tobacco: Never Used  . Alcohol use No  . Drug use: No  . Sexual activity: Not on file   Other Topics Concern  . 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    No firearms    NO reg exercise    Pets- Poodles 2    Sleep 7 hours      Family History  Problem Relation Age of Onset  . Emphysema Mother        Smoker  . Heart attack Mother        due to head injury  . Alcohol abuse Mother   . Heart disease Mother   . Early death Mother   . Varicose Veins Mother   . AAA (abdominal aortic aneurysm) Mother   . Lung cancer Father   . Heart attack Father   . Hypertension Father   . Alcohol abuse Father   . Heart disease Father        before age 38  . Aneurysm Father   . Hyperlipidemia Father   . Alcohol abuse Brother   . Diabetes Brother   . Heart disease Brother   . Heart attack Brother   . Diabetes Brother   . Aneurysm Paternal Grandmother   . Diabetes Unknown        grandfather  . Sudden Cardiac Death Brother   . Colon cancer Neg Hx   . Esophageal cancer Neg Hx   . Stomach cancer Neg Hx     ROS: no fevers or chills, productive cough, hemoptysis, dysphasia, odynophagia, melena, hematochezia, dysuria, hematuria, rash, seizure activity, orthopnea, PND, pedal edema, claudication. Remaining systems are negative.  Physical Exam: Well-developed well-nourished in no acute distress.  Skin is warm and dry.  HEENT is normal.  Neck is supple. No bruits Chest is clear to auscultation with normal expansion.  Cardiovascular exam is regular rate and rhythm.  Abdominal exam nontender or distended. No masses palpated. Extremities show no edema. neuro grossly intact  ECG- Sinus rhythm at a rate of 65. Occasional PVC. Inferior infarct. personally reviewed  A/P  1 Coronary artery disease-patient is doing well from a symptomatic standpoint with no chest pain or dyspnea. Continue aspirin and statin.  2 carotid artery disease-mild on most recent Dopplers.  3 hyperlipidemia-last cholesterol panel from June 2018 personally reviewed. Total cholesterol 141 with LDL 78. Liver functions normal. Continue Lipitor.   4 obesity-we discussed lifestyle modification including diet, exercise and weight loss.  Kirk Ruths,  MD

## 2016-11-26 ENCOUNTER — Other Ambulatory Visit: Payer: Self-pay | Admitting: Internal Medicine

## 2016-11-30 ENCOUNTER — Encounter: Payer: Self-pay | Admitting: Cardiology

## 2016-11-30 ENCOUNTER — Ambulatory Visit (INDEPENDENT_AMBULATORY_CARE_PROVIDER_SITE_OTHER): Payer: PPO | Admitting: Cardiology

## 2016-11-30 VITALS — BP 114/68 | HR 65 | Ht 65.0 in | Wt 180.0 lb

## 2016-11-30 DIAGNOSIS — I493 Ventricular premature depolarization: Secondary | ICD-10-CM

## 2016-11-30 DIAGNOSIS — I440 Atrioventricular block, first degree: Secondary | ICD-10-CM | POA: Diagnosis not present

## 2016-11-30 DIAGNOSIS — I2510 Atherosclerotic heart disease of native coronary artery without angina pectoris: Secondary | ICD-10-CM | POA: Diagnosis not present

## 2016-11-30 DIAGNOSIS — E7800 Pure hypercholesterolemia, unspecified: Secondary | ICD-10-CM

## 2016-11-30 NOTE — Patient Instructions (Signed)
No changes with current medications    Your physician wants you to follow-up in 12 months with DR CRENSHAW. You will receive a reminder letter in the mail two months in advance. If you don't receive a letter, please call our office to schedule the follow-up appointment.     If you need a refill on your cardiac medications before your next appointment, please call your pharmacy.

## 2016-12-01 ENCOUNTER — Other Ambulatory Visit: Payer: Self-pay | Admitting: Internal Medicine

## 2016-12-17 ENCOUNTER — Other Ambulatory Visit: Payer: Self-pay | Admitting: Cardiology

## 2016-12-17 ENCOUNTER — Other Ambulatory Visit: Payer: Self-pay | Admitting: Internal Medicine

## 2016-12-17 NOTE — Telephone Encounter (Signed)
Rx has been sent to the pharmacy electronically. ° °

## 2016-12-18 NOTE — Telephone Encounter (Signed)
Medication filled to pharmacy as requested.   

## 2016-12-20 ENCOUNTER — Ambulatory Visit (INDEPENDENT_AMBULATORY_CARE_PROVIDER_SITE_OTHER): Payer: PPO | Admitting: *Deleted

## 2016-12-20 DIAGNOSIS — Z23 Encounter for immunization: Secondary | ICD-10-CM | POA: Diagnosis not present

## 2016-12-24 ENCOUNTER — Encounter: Payer: Self-pay | Admitting: Family Medicine

## 2016-12-24 ENCOUNTER — Ambulatory Visit (INDEPENDENT_AMBULATORY_CARE_PROVIDER_SITE_OTHER): Payer: PPO | Admitting: Family Medicine

## 2016-12-24 VITALS — BP 120/72 | HR 72 | Temp 98.3°F | Resp 12 | Ht 65.0 in | Wt 180.0 lb

## 2016-12-24 DIAGNOSIS — R300 Dysuria: Secondary | ICD-10-CM | POA: Diagnosis not present

## 2016-12-24 DIAGNOSIS — M545 Low back pain, unspecified: Secondary | ICD-10-CM

## 2016-12-24 DIAGNOSIS — N390 Urinary tract infection, site not specified: Secondary | ICD-10-CM | POA: Diagnosis not present

## 2016-12-24 LAB — POCT URINALYSIS DIPSTICK
Bilirubin, UA: NEGATIVE
KETONES UA: NEGATIVE
Nitrite, UA: NEGATIVE
PH UA: 5.5 (ref 5.0–8.0)
PROTEIN UA: NEGATIVE
SPEC GRAV UA: 1.01 (ref 1.010–1.025)
Urobilinogen, UA: 0.2 E.U./dL

## 2016-12-24 MED ORDER — CIPROFLOXACIN HCL 500 MG PO TABS: 500.0000 mg | ORAL_TABLET | Freq: Two times a day (BID) | ORAL | 0 refills | Status: DC

## 2016-12-24 NOTE — Progress Notes (Signed)
ACUTE VISIT HPI:  Chief Complaint  Patient presents with  . Dysuria  . Cloudy urine    Ms.Morgan [name] is a 66 y.o. female, who is here today complaining of 2-3 days of urinary symptoms.  "very cloudy" urine and lower back pain. Hx of intermittent low back pain, started 2 days ago, "not bad", no radiated. She denies saddle anesthesia or bowel incontinence.   Dysuria: Yes, achy like sensation. Urinary frequency: Yes, more than usual. Urinary urgency: Yes Incontinence: Denies Hematuria: Denies.  She denies fever, chills, decreased appetite, or fatigue.  Abdominal pain: Denies.  Nausea or vomiting: Denies  Abnormal vaginal bleeding or discharge: Denies. Mild vaginal itching.  LMP: Post menopause.  Sexual activity: Denies Hx of UTI: Since she has been on Invokana she has has had UTI, last one in 2015. Ucx grew Klebsiella pneumoniae, susceptible to Cipro and bactrim.  BS's "pretty high" for the past few days, 170's.  OTC medications for this problem: None.  Problem has been stable. She is concerned because she is planning on leaving town in 2 days, going to [place].   Review of Systems  Constitutional: Negative for activity change, appetite change, fatigue and fever.  Cardiovascular: Negative for leg swelling.  Gastrointestinal: Negative for abdominal pain, nausea and vomiting.       No changes in bowel habits.  Genitourinary: Positive for dysuria, frequency and urgency. Negative for decreased urine volume, hematuria, vaginal bleeding and vaginal discharge.  Musculoskeletal: Positive for back pain. Negative for gait problem.  Skin: Negative for rash and wound.  Neurological: Negative for weakness and numbness.  Psychiatric/Behavioral: Negative for confusion. The patient is nervous/anxious.       Current Outpatient Medications on File Prior to Visit  Medication Sig Dispense Refill  . aspirin 81 MG tablet Take 81 mg by mouth daily.      Marland Kitchen  atorvastatin (LIPITOR) 80 MG tablet TAKE 1 TABLET BY MOUTH EVERY DAY AT 6 PM 90 tablet 3  . BAYER MICROLET LANCETS lancets 100 each by Other route 2 (two) times daily. Use as instructed 100 each 11  . clindamycin (CLEOCIN) 150 MG capsule Take by mouth. 4 tabs prior to dental work     . Cyanocobalamin (B-12 PO) Take by mouth.    . ezetimibe (ZETIA) 10 MG tablet TAKE 1 TABLET(10 MG) BY MOUTH DAILY 90 tablet 0  . Ferrous Sulfate (IRON) 325 (65 Fe) MG TABS Take 1 tablet by mouth daily.    Marland Kitchen glucose blood (BAYER CONTOUR NEXT TEST) test strip Use as instructed twice daily 100 each 11  . glucose blood test strip Use as instructed 100 each 12  . Insulin Pen Needle (B-D UF III MINI PEN NEEDLES) 31G X 5 MM MISC USE AS DIRECTED. 300 each 3  . INVOKANA 300 MG TABS tablet TAKE 1 TABLET(300 MG) BY MOUTH DAILY BEFORE BREAKFAST 30 tablet 0  . metFORMIN (GLUCOPHAGE-XR) 500 MG 24 hr tablet TAKE 2 TABLETS BY MOUTH TWICE DAILY WITH MEALS 360 tablet 1  . Multiple Vitamins-Minerals (MULTIVITAMIN PO) Take by mouth daily.    Marland Kitchen omeprazole (PRILOSEC) 20 MG capsule TAKE 1 CAPSULE BY MOUTH EVERY DAY 30 capsule 2  . VICTOZA 18 MG/3ML SOPN ADMINISTER 1.8 MG UNDER THE SKIN DAILY 5 pen 5   No current facility-administered medications on file prior to visit.      Past Medical History:  Diagnosis Date  . Anemia    as a child   . CAD, NATIVE  VESSEL 05/13/2009  . DIABETES MELLITUS, TYPE II 12/09/2009  . Gastric ulcer   . Gastritis   . GERD (gastroesophageal reflux disease)   . Hx of abnormal cervical Pap smear    cryo  but normal since then   . HYPERLIPIDEMIA-MIXED 04/14/2008  . Intestinal metaplasia of gastric mucosa 2012  . Myocardial infarction ([place])    2003  . Obesity   . Occult blood in stools   . Osteopenia   . OVERWEIGHT/OBESITY 05/13/2009  . Ulcer    Duodenal ulcer by x-ray 40 years ago  . Varicose veins    under rx lawson 2016   Allergies  Allergen Reactions  . Penicillins     REACTION: as a child     Social History   Socioeconomic History  . Marital status: Married    Spouse name: None  . Number of children: 2  . Years of education: phd   . Highest education level: None  Social Needs  . Financial resource strain: None  . Food insecurity - worry: None  . Food insecurity - inability: None  . Transportation needs - medical: None  . Transportation needs - non-medical: None  Occupational History  . Occupation: Mental Health Counselor    Comment: [employer]  . Occupation: Nurse, adult: [employer]  Tobacco Use  . Smoking status: Former Smoker    Last attempt to quit: 02/05/1994    Years since quitting: 22.8  . Smokeless tobacco: Never Used  Substance and Sexual Activity  . Alcohol use: No    Alcohol/week: 0.0 oz  . Drug use: No  . Sexual activity: None  Other Topics Concern  . 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    No firearms    NO reg exercise    Pets- Poodles 2    Sleep 7 hours     Vitals:   12/24/16 0831  BP: 120/72  Pulse: 72  Resp: 12  Temp: 98.3 F (36.8 C)  SpO2: 98%   Body mass index is 29.95 kg/m.   Physical Exam  Nursing note and vitals reviewed. Constitutional: She is oriented to person, place, and time. She appears well-developed. No distress.  HENT:  Head: Normocephalic and atraumatic.  Mouth/Throat: Oropharynx is clear and moist and mucous membranes are normal.  Eyes: Conjunctivae are normal.  Cardiovascular: Normal rate and regular rhythm.  Respiratory: Effort normal and breath sounds normal. No respiratory distress.  GI: Soft. She exhibits no mass. There is no tenderness. There is no CVA tenderness.  Musculoskeletal: She exhibits no edema or tenderness.       Lumbar back: She exhibits no tenderness and  no bony tenderness.  Neurological: She is alert and oriented to person, place, and time. She has normal strength. Gait normal.  Skin: Skin is warm. No rash noted. No erythema.  Psychiatric: Her speech is normal. Her mood appears anxious.  Well groomed,good eye contact.    ASSESSMENT AND PLAN:   [name] was seen today for dysuria and cloudy urine.  Diagnoses and all orders for this visit:  Dysuria  Urine dipstick with mild abnormalities: 1+ blood,small amount of LEUK. Possible  etiologies discussed.  Ucx sent.  -     POCT urinalysis dipstick -     ciprofloxacin (CIPRO) 500 MG tablet; Take 1 tablet (500 mg total) 2 (two) times daily by mouth. -     Urine culture  Urinary tract infection without hematuria, site unspecified  Empiric abx treatment started today and will be tailored according to Ucx results and susceptibility report.  Clearly instructed about warning signs. F/U if symptoms persist.  -     ciprofloxacin (CIPRO) 500 MG tablet; Take 1 tablet (500 mg total) 2 (two) times daily by mouth. -     Urine culture  Low back pain without sciatica, unspecified back pain laterality, unspecified chronicity  I do not think this probably is related to urine symptoms. She can try OTC Tylenol or topical medications available OTC. Follow-up as needed.     -Ms.Morgan [name] was advised to seek immediate medical attention if sudden worsening of symptoms or to follow with PCP if they persist or new concerns arise.    Henrietta Cieslewicz G. [place], MD  [facility]. [place] office.

## 2016-12-24 NOTE — Patient Instructions (Signed)
  Ms.Morgan [name] I have seen you today for an acute visit.  A few things to remember from today's visit:   Dysuria - Plan: POCT urinalysis dipstick, ciprofloxacin (CIPRO) 500 MG tablet, Urine culture  Urinary tract infection without hematuria, site unspecified - Plan: ciprofloxacin (CIPRO) 500 MG tablet, Urine culture   Medications prescribed today are intended for short period of time and will not be refill upon request, a follow up appointment might be necessary to discuss continuation of of treatment if appropriate.    Adequate fluid intake, avoid holding urine for long hours, and over the counter Vit C OR cranberry capsules might help.  Today we will treat empirically with antibiotic, which we might need to change when urine culture comes back depending of bacteria susceptibility.  Seek immediate medical attention if severe abdominal pain, vomiting, fever/chills, or worsening symptoms. F/U if symptomatic are not any better after 2-3 days of antibiotic treatment.    In general please monitor for signs of worsening symptoms and seek immediate medical attention if any concerning.  I hope you get better soon!

## 2016-12-25 ENCOUNTER — Telehealth: Payer: Self-pay | Admitting: *Deleted

## 2016-12-25 NOTE — Telephone Encounter (Signed)
Will route to [place] as she is handling the offices PA's

## 2016-12-25 NOTE — Telephone Encounter (Signed)
Copied from [place] [id number]. Topic: Inquiry >> Dec 25, 2016 11:56 AM Scherrie Gerlach wrote: Reason for CRM:  [facility] pharmacy calling about an urgent prior for the pt's VICTOZA 18 MG/3ML SOPN. They states we should have gotten a fax for this med.  Please call [phone]  Ref  [id number]  She states urgent due to the time has lapsed

## 2016-12-25 NOTE — Telephone Encounter (Signed)
Per note in Covermymeds no prior auth is needed for the Rx.  I called [facility] pharmacy and spoke with Zarna and informed her of this and she stated the Rx went through with no problems; however the pt is not due until 12/2.  I also called the pt and left a detailed message with this information.

## 2016-12-27 LAB — URINE CULTURE
MICRO NUMBER:: 81302452
SPECIMEN QUALITY: ADEQUATE

## 2016-12-28 ENCOUNTER — Encounter: Payer: Self-pay | Admitting: Family Medicine

## 2017-01-04 ENCOUNTER — Encounter: Payer: Self-pay | Admitting: Family Medicine

## 2017-01-04 ENCOUNTER — Ambulatory Visit: Payer: Self-pay

## 2017-01-04 NOTE — Progress Notes (Signed)
Chief Complaint  Patient presents with  . Urinary Tract Infection    Pt has been having UTI sx's since Thanksgiving. Completed course of Cipro. Pt states that her sx's subsided for a while then returned about 1 week ago. Pt notes change in color to urine. Burning with urination, cramping and aching in abd and lower back    HPI: Morgan [name] 66 y.o.  Come sin for recurrent urinary sx .   Took cipro form 11 19 uti  Res to many antibiotic  Was a lot better and then off med sx seems to  be recurring    Color change in am   Urine     Drinking a lot of water  And thirsty  But  bg 139 this am  . worried about  uti  Clear with  Hurts   Through urination ache  Dee pin side.    Lower back pain may be baseline.  No fever chills .  blood in urine with mucous  .  Last week  Using cranberry pills   ROS: See pertinent positives and negatives per HPI.  Past Medical History:  Diagnosis Date  . Anemia    as a child   . CAD, NATIVE VESSEL 05/13/2009  . DIABETES MELLITUS, TYPE II 12/09/2009  . Gastric ulcer   . Gastritis   . GERD (gastroesophageal reflux disease)   . Hx of abnormal cervical Pap smear    cryo  but normal since then   . HYPERLIPIDEMIA-MIXED 04/14/2008  . Intestinal metaplasia of gastric mucosa 2012  . Myocardial infarction ([place])    2003  . Obesity   . Occult blood in stools   . Osteopenia   . OVERWEIGHT/OBESITY 05/13/2009  . Ulcer    Duodenal ulcer by x-ray 40 years ago  . Varicose veins    under rx lawson 2016    Family History  Problem Relation Age of Onset  . Emphysema Mother        Smoker  . Heart attack Mother        due to head injury  . Alcohol abuse Mother   . Heart disease Mother   . Early death Mother   . Varicose Veins Mother   . AAA (abdominal aortic aneurysm) Mother   . Lung cancer Father   . Heart attack Father   . Hypertension Father   . Alcohol abuse Father   . Heart disease Father        before age 46  . Aneurysm Father   . Hyperlipidemia  Father   . Alcohol abuse Brother   . Diabetes Brother   . Heart disease Brother   . Heart attack Brother   . Diabetes Brother   . Aneurysm Paternal Grandmother   . Diabetes Unknown        grandfather  . Sudden Cardiac Death Brother   . Colon cancer Neg Hx   . Esophageal cancer Neg Hx   . Stomach cancer Neg Hx     Social History   Socioeconomic History  . Marital status: Widowed    Spouse name: None  . Number of children: 2  . Years of education: phd   . Highest education level: None  Social Needs  . Financial resource strain: None  . Food insecurity - worry: None  . Food insecurity - inability: None  . Transportation needs - medical: None  . Transportation needs - non-medical: None  Occupational History  . Occupation: Licensed conveyancer  Comment: [employer]  . Occupation: Nurse, adult: [employer]  Tobacco Use  . Smoking status: Former Smoker    Last attempt to quit: 02/05/1994    Years since quitting: 22.9  . Smokeless tobacco: Never Used  Substance and Sexual Activity  . Alcohol use: No    Alcohol/week: 0.0 oz  . Drug use: No  . Sexual activity: None  Other Topics Concern  . 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    No firearms    NO reg exercise    Pets- Poodles 2    Sleep 7 hours     Outpatient Medications Prior to Visit  Medication Sig Dispense Refill  . aspirin 81 MG tablet Take 81 mg by mouth daily.      Marland Kitchen atorvastatin (LIPITOR) 80 MG tablet TAKE 1 TABLET BY MOUTH EVERY DAY AT 6 PM 90 tablet 3  . BAYER MICROLET LANCETS lancets 100 each by Other route 2 (two) times daily. Use as instructed 100 each 11  . clindamycin (CLEOCIN) 150 MG capsule Take by mouth. 4 tabs prior to dental work     . Cyanocobalamin (B-12 PO)  Take by mouth.    . ezetimibe (ZETIA) 10 MG tablet TAKE 1 TABLET(10 MG) BY MOUTH DAILY 90 tablet 0  . Ferrous Sulfate (IRON) 325 (65 Fe) MG TABS Take 1 tablet by mouth daily.    Marland Kitchen glucose blood test strip Use as instructed 100 each 12  . Insulin Pen Needle (B-D UF III MINI PEN NEEDLES) 31G X 5 MM MISC USE AS DIRECTED. 300 each 3  . INVOKANA 300 MG TABS tablet TAKE 1 TABLET(300 MG) BY MOUTH DAILY BEFORE BREAKFAST 30 tablet 0  . metFORMIN (GLUCOPHAGE-XR) 500 MG 24 hr tablet TAKE 2 TABLETS BY MOUTH TWICE DAILY WITH MEALS 360 tablet 1  . Multiple Vitamins-Minerals (MULTIVITAMIN PO) Take by mouth daily.    Marland Kitchen omeprazole (PRILOSEC) 20 MG capsule TAKE 1 CAPSULE BY MOUTH EVERY DAY 30 capsule 2  . VICTOZA 18 MG/3ML SOPN ADMINISTER 1.8 MG UNDER THE SKIN DAILY 5 pen 5  . ciprofloxacin (CIPRO) 500 MG tablet Take 1 tablet (500 mg total) 2 (two) times daily by mouth. (Patient not taking: Reported on 01/07/2017) 6 tablet 0  . glucose blood (BAYER CONTOUR NEXT TEST) test strip Use as instructed twice daily (Patient not taking: Reported on 01/07/2017) 100 each 11   No facility-administered medications prior to visit.      EXAM:  BP 102/68 (BP Location: Right Arm, Patient Position: Sitting, Cuff Size: Normal)   Pulse 75   Temp 98.1 F (36.7 C) (Oral)   Wt 178 lb 12.8 oz (81.1 kg)   BMI 29.75 kg/m   Body mass index is 29.75 kg/m.  GENERAL: vitals reviewed and listed above, alert, oriented, appears well hydrated and in no acute distress HEENT: atraumatic, conjunctiva  clear, no obvious abnormalities on inspection of external nose and ears NECK: no obvious masses on inspection palpation  CV: HRRR, no clubbing cyanosis or  peripheral edema nl cap refill  Abdomen:  Sof,t normal bowel sounds without hepatosplenomegaly, no guarding rebound or masses no CVA tenderness MS:  moves all extremities without noticeable focal  abnormality PSYCH: pleasant and cooperative, no obvious depression or anxiety ua dx neg x  3+ glucose  Last uti nov e coli resistant to all oral x augmentin macrobid and quinolones  ASSESSMENT AND PLAN:  Discussed the following assessment and plan:  Dysuria - ur cx  can add  otc   uristat other  baldder anesthetics  if helps in the interim  - Plan: POC Urinalysis Dipstick, Urine Culture  Urinary tract infection without hematuria, site unspecified - poss relaspsing vs recurrent .  culture   add macrobid based on past  e coli antibiotigram   Diabetes mellitus without complication ([place]) - reasonable control but hx and past  ok to renew victoza  -Patient advised to return or notify health care team  if symptoms worsen ,persist or new concerns arise.  Patient Instructions  Plan culture tests and antibiotic     Let you know  when results back and adapts treatment        Mariann Laster K. Panosh M.D.

## 2017-01-04 NOTE — Telephone Encounter (Signed)
Left patient a message informing her that she need to be in the office.

## 2017-01-04 NOTE — Telephone Encounter (Signed)
She needs to be seen if she feels like she has a UTI. Tomorrow she could be seen at [facility]. Thanks, BJ

## 2017-01-04 NOTE — Telephone Encounter (Signed)
  Reason for Disposition . Urinating more frequently than usual (i.e., frequency)  Answer Assessment - Initial Assessment Questions 1. SYMPTOM: "What's the main symptom you're concerned about?" (e.g., frequency, incontinence)     Painful, frequency, back discomfort 2. ONSET: "When did the  ________  start?"     This morning 3. PAIN: "Is there any pain?" If so, ask: "How bad is it?" (Scale: 1-10; mild, moderate, severe)     2-3 4. CAUSE: "What do you think is causing the symptoms?"     UTI 5. OTHER SYMPTOMS: "Do you have any other symptoms?" (e.g., fever, flank pain, blood in urine, pain with urination)     No fever 6. PREGNANCY: "Is there any chance you are pregnant?" "When was your last menstrual period?"     No Finished her Cipro last week.  Protocols used: URINARY [facility]  Request another antibiotic be called in because it would be hard for her to leave work for an appointment. Please advise.

## 2017-01-07 ENCOUNTER — Ambulatory Visit: Payer: PPO | Admitting: Internal Medicine

## 2017-01-07 ENCOUNTER — Encounter: Payer: Self-pay | Admitting: Internal Medicine

## 2017-01-07 VITALS — BP 102/68 | HR 75 | Temp 98.1°F | Wt 178.8 lb

## 2017-01-07 DIAGNOSIS — R300 Dysuria: Secondary | ICD-10-CM | POA: Diagnosis not present

## 2017-01-07 DIAGNOSIS — E119 Type 2 diabetes mellitus without complications: Secondary | ICD-10-CM

## 2017-01-07 DIAGNOSIS — N390 Urinary tract infection, site not specified: Secondary | ICD-10-CM

## 2017-01-07 LAB — POCT URINALYSIS DIPSTICK
Bilirubin, UA: NEGATIVE
Ketones, UA: NEGATIVE
Leukocytes, UA: NEGATIVE
Nitrite, UA: NEGATIVE
PROTEIN UA: NEGATIVE
RBC UA: NEGATIVE
SPEC GRAV UA: 1.01 (ref 1.010–1.025)
UROBILINOGEN UA: 0.2 U/dL
pH, UA: 5.5 (ref 5.0–8.0)

## 2017-01-07 MED ORDER — LIRAGLUTIDE 18 MG/3ML ~~LOC~~ SOPN: PEN_INJECTOR | SUBCUTANEOUS | 5 refills | Status: DC

## 2017-01-07 MED ORDER — NITROFURANTOIN MONOHYD MACRO 100 MG PO CAPS: 100.0000 mg | ORAL_CAPSULE | Freq: Two times a day (BID) | ORAL | 0 refills | Status: AC

## 2017-01-07 NOTE — Patient Instructions (Addendum)
Plan culture tests and antibiotic     Let you know  when results back and adapts treatment

## 2017-01-07 NOTE — Addendum Note (Signed)
Addended by: Virl Cagey on: 01/07/2017 04:00 PM   Modules accepted: Orders

## 2017-01-07 NOTE — Addendum Note (Signed)
Addended by: Virl Cagey on: 01/07/2017 03:15 PM   Modules accepted: Orders

## 2017-01-09 NOTE — Telephone Encounter (Signed)
Pt saw Dr Regis Bill 12/03 for this issue

## 2017-01-16 ENCOUNTER — Other Ambulatory Visit: Payer: Self-pay | Admitting: Internal Medicine

## 2017-01-20 ENCOUNTER — Encounter: Payer: Self-pay | Admitting: Internal Medicine

## 2017-01-21 MED ORDER — CANAGLIFLOZIN 300 MG PO TABS: ORAL_TABLET | ORAL | 1 refills | Status: DC

## 2017-01-21 NOTE — Telephone Encounter (Signed)
Rx for Invokana changed to 90-day supply

## 2017-02-16 ENCOUNTER — Other Ambulatory Visit: Payer: Self-pay | Admitting: Cardiology

## 2017-02-25 NOTE — Progress Notes (Signed)
Chief Complaint  Patient presents with  . Follow-up    no new concerns    HPI: Morgan [name] 67 y.o. come in for Chronic disease management  But also  has a symptoms  And more tired than normal  Other issues   House  Flooding and  Don married. And  Having to help a  Town home mate  With her cancer caretaking   has had some fatigue and wants to make sure not a disease causing this.   Callous  Right foot .  No redness    But gets tender   No neuropathy ? If balance off .  At times  No falling  Dm  140 range  Some weight loss   Took some iron  And has some b12  ? ifon deficient    Has had recurrent utos  None today  ROS: See pertinent positives and negatives per HPI. No fevers   Sob falling as per hpi  Not sure when due for endo or colon  Used tot see dr Olevia Perches   Past Medical History:  Diagnosis Date  . Anemia    as a child   . CAD, NATIVE VESSEL 05/13/2009  . DIABETES MELLITUS, TYPE II 12/09/2009  . Gastric ulcer   . Gastritis   . GERD (gastroesophageal reflux disease)   . Hx of abnormal cervical Pap smear    cryo  but normal since then   . HYPERLIPIDEMIA-MIXED 04/14/2008  . Intestinal metaplasia of gastric mucosa 2012  . Myocardial infarction ([place])    2003  . Obesity   . Occult blood in stools   . Osteopenia   . OVERWEIGHT/OBESITY 05/13/2009  . Ulcer    Duodenal ulcer by x-ray 40 years ago  . Varicose veins    under rx lawson 2016    Family History  Problem Relation Age of Onset  . Emphysema Mother        Smoker  . Heart attack Mother        due to head injury  . Alcohol abuse Mother   . Heart disease Mother   . Early death Mother   . Varicose Veins Mother   . AAA (abdominal aortic aneurysm) Mother   . Lung cancer Father   . Heart attack Father   . Hypertension Father   . Alcohol abuse Father   . Heart disease Father        before age 36  . Aneurysm Father   . Hyperlipidemia Father   . Alcohol abuse Brother   . Diabetes Brother   . Heart disease  Brother   . Heart attack Brother   . Diabetes Brother   . Aneurysm Paternal Grandmother   . Diabetes Unknown        grandfather  . Sudden Cardiac Death Brother   . Colon cancer Neg Hx   . Esophageal cancer Neg Hx   . Stomach cancer Neg Hx     Social History   Socioeconomic History  . Marital status: Widowed    Spouse name: None  . Number of children: 2  . Years of education: phd   . Highest education level: None  Social Needs  . Financial resource strain: None  . Food insecurity - worry: None  . Food insecurity - inability: None  . Transportation needs - medical: None  . Transportation needs - non-medical: None  Occupational History  . Occupation: Mental Health Counselor    Comment: [employer]  .  Occupation: Nurse, adult: [name]  Tobacco Use  . Smoking status: Former Smoker    Last attempt to quit: 02/05/1994    Years since quitting: 23.0  . Smokeless tobacco: Never Used  Substance and Sexual Activity  . Alcohol use: No    Alcohol/week: 0.0 oz  . Drug use: No  . Sexual activity: None  Other Topics Concern  . 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    No firearms    NO reg exercise    Pets- Poodles 2    Sleep 7 hours     Outpatient Medications Prior to Visit  Medication Sig Dispense Refill  . aspirin 81 MG tablet Take 81 mg by mouth daily.      Marland Kitchen atorvastatin (LIPITOR) 80 MG tablet TAKE 1 TABLET BY MOUTH EVERY DAY AT 6 PM 90 tablet 3  . canagliflozin (INVOKANA) 300 MG TABS tablet TAKE 1 TABLET(300 MG) BY MOUTH DAILY BEFORE BREAKFAST 90 tablet 1  . clindamycin (CLEOCIN) 150 MG capsule Take by mouth. 4 tabs prior to dental work     . Cyanocobalamin (B-12 PO) Take by mouth.    . ezetimibe (ZETIA) 10 MG tablet TAKE 1 TABLET BY  MOUTH DAILY 90 tablet 0  . Ferrous Sulfate (IRON) 325 (65 Fe) MG TABS Take 1 tablet by mouth daily.    Marland Kitchen glucose blood test strip Use as instructed (Patient taking differently: One Touch Ultra:  Use as instructed) 100 each 12  . Insulin Pen Needle (B-D UF III MINI PEN NEEDLES) 31G X 5 MM MISC USE AS DIRECTED. 300 each 3  . liraglutide (VICTOZA) 18 MG/3ML SOPN ADMINISTER 1.8 MG UNDER THE SKIN DAILY 3 pen 5  . metFORMIN (GLUCOPHAGE-XR) 500 MG 24 hr tablet TAKE 2 TABLETS BY MOUTH TWICE DAILY WITH MEALS 360 tablet 1  . Multiple Vitamins-Minerals (MULTIVITAMIN PO) Take by mouth daily.    Marland Kitchen omeprazole (PRILOSEC) 20 MG capsule TAKE 1 CAPSULE BY MOUTH EVERY DAY 30 capsule 2  . ONETOUCH DELICA LANCETS FINE MISC by Does not apply route as directed.    Elmore Guise Device MISC by Does not apply route. One Touch Ultra     No facility-administered medications prior to visit.      EXAM:  BP 112/64 (BP Location: Right Arm, Patient Position: Sitting, Cuff Size: Normal)   Pulse 70   Temp 98.2 F (36.8 C) (Oral)   Wt 175 lb (79.4 kg)   BMI 29.12 kg/m   Body mass index is 29.12 kg/m.  GENERAL: vitals reviewed and listed above, alert, oriented, appears well hydrated and in no acute distress HEENT: atraumatic, conjunctiva  clear, no obvious abnormalities on inspection of external nose and ears OP : no lesion edema or exudate  NECK: no obvious masses on inspection palpation  LUNGS: clear to auscultation bilaterally, no wheezes, rales or rhonchi, good air movement CV: HRRR, no clubbing cyanosis or  peripheral edema nl cap refill  Abdomen:  Sof,t normal bowel sounds without hepatosplenomegaly, no guarding rebound or masses no CVA tenderness MS: moves all extremities without noticeable focal  Abnormality  righ foot  Callus  Great mtp no  redness but slight tenderess  No ulcers  PSYCH: pleasant and cooperative, no obvious depression or anxiety Lab Results  Component Value Date   WBC 4.7 02/26/2017   HGB 14.5  02/26/2017   HCT 42.9 02/26/2017   PLT 205.0 02/26/2017   GLUCOSE 225 (H) 02/26/2017   CHOL 141 07/09/2016   TRIG 117.0 07/09/2016   HDL 40.10 07/09/2016   LDLCALC 78 07/09/2016   ALT 20 02/26/2017   AST 16 02/26/2017   NA 137 02/26/2017   K 4.3 02/26/2017   CL 97 02/26/2017   CREATININE 0.68 02/26/2017   BUN 11 02/26/2017   CO2 28 02/26/2017   TSH 3.28 02/26/2017   HGBA1C 7.5 (H) 02/26/2017   MICROALBUR <0.7 07/09/2016   BP Readings from Last 3 Encounters:  02/26/17 112/64  01/07/17 102/68  12/24/16 120/72   Wt Readings from Last 3 Encounters:  02/26/17 175 lb (79.4 kg)  01/07/17 178 lb 12.8 oz (81.1 kg)  12/24/16 180 lb (81.6 kg)   185 last year.  ASSESSMENT AND PLAN:  Discussed the following assessment and plan:  Diabetes mellitus without complication ([place]) - Plan: CBC with Differential/Platelet, Hemoglobin A1c, CMP, TSH, T4, free, Iron, TIBC and Ferritin Panel, Vitamin B12, Celiac Disease Comprehensive Panel with Reflexes, Ambulatory referral to Podiatry  Medication management - Plan: CBC with Differential/Platelet, Hemoglobin A1c, CMP, TSH, T4, free, Iron, TIBC and Ferritin Panel, Vitamin B12, Celiac Disease Comprehensive Panel with Reflexes, Sedimentation rate  Atherosclerosis of native coronary artery of native heart without angina pectoris - Plan: CBC with Differential/Platelet, Hemoglobin A1c, CMP, TSH, T4, free, Iron, TIBC and Ferritin Panel, Vitamin B12, Celiac Disease Comprehensive Panel with Reflexes  Hyperlipidemia, unspecified hyperlipidemia type - Plan: CBC with Differential/Platelet, Hemoglobin A1c, CMP, TSH, T4, free, Iron, TIBC and Ferritin Panel, Vitamin B12, Celiac Disease Comprehensive Panel with Reflexes  Imbalance - Plan: CBC with Differential/Platelet, Hemoglobin A1c, CMP, TSH, T4, free, Iron, TIBC and Ferritin Panel, Vitamin B12, Celiac Disease Comprehensive Panel with Reflexes  Other fatigue - Plan: CBC with Differential/Platelet, Hemoglobin  A1c, CMP, TSH, T4, free, Iron, TIBC and Ferritin Panel, Vitamin B12, Celiac Disease Comprehensive Panel with Reflexes, Sedimentation rate  GERD without esophagitis - Plan: CBC with Differential/Platelet, Hemoglobin A1c, CMP, TSH, T4, free, Iron, TIBC and Ferritin Panel, Vitamin B12, Celiac Disease Comprehensive Panel with Reflexes  Iron deficiency - Plan: CBC with Differential/Platelet, Hemoglobin A1c, CMP, TSH, T4, free, Iron, TIBC and Ferritin Panel, Vitamin B12, Celiac Disease Comprehensive Panel with Reflexes, Sedimentation rate  Pre-ulcerative corn or callous - Plan: Ambulatory referral to Podiatry   Blood tests today   R/o metabolic other causes of sx   And diabetes control   If all ok and she is well can go 4-6 months ( instead of 3)  -Patient advised to return or notify health care team  if  new concerns arise.  Patient Instructions  Will do podiatry referral   Labs   Today and then plan follow up . Make appt with GI.  If all  okthen cpx in 6 months .    Standley Brooking. Khani Paino M.D.

## 2017-02-26 ENCOUNTER — Ambulatory Visit (INDEPENDENT_AMBULATORY_CARE_PROVIDER_SITE_OTHER): Payer: PPO | Admitting: Internal Medicine

## 2017-02-26 ENCOUNTER — Encounter: Payer: Self-pay | Admitting: Internal Medicine

## 2017-02-26 VITALS — BP 112/64 | HR 70 | Temp 98.2°F | Wt 175.0 lb

## 2017-02-26 DIAGNOSIS — I2510 Atherosclerotic heart disease of native coronary artery without angina pectoris: Secondary | ICD-10-CM | POA: Diagnosis not present

## 2017-02-26 DIAGNOSIS — E785 Hyperlipidemia, unspecified: Secondary | ICD-10-CM

## 2017-02-26 DIAGNOSIS — E611 Iron deficiency: Secondary | ICD-10-CM | POA: Diagnosis not present

## 2017-02-26 DIAGNOSIS — R2689 Other abnormalities of gait and mobility: Secondary | ICD-10-CM

## 2017-02-26 DIAGNOSIS — R5383 Other fatigue: Secondary | ICD-10-CM | POA: Diagnosis not present

## 2017-02-26 DIAGNOSIS — E119 Type 2 diabetes mellitus without complications: Secondary | ICD-10-CM | POA: Diagnosis not present

## 2017-02-26 DIAGNOSIS — K219 Gastro-esophageal reflux disease without esophagitis: Secondary | ICD-10-CM | POA: Diagnosis not present

## 2017-02-26 DIAGNOSIS — L84 Corns and callosities: Secondary | ICD-10-CM | POA: Diagnosis not present

## 2017-02-26 DIAGNOSIS — Z79899 Other long term (current) drug therapy: Secondary | ICD-10-CM | POA: Diagnosis not present

## 2017-02-26 LAB — CBC WITH DIFFERENTIAL/PLATELET
BASOS PCT: 0.6 % (ref 0.0–3.0)
Basophils Absolute: 0 10*3/uL (ref 0.0–0.1)
EOS PCT: 0.7 % (ref 0.0–5.0)
Eosinophils Absolute: 0 10*3/uL (ref 0.0–0.7)
HCT: 42.9 % (ref 36.0–46.0)
Hemoglobin: 14.5 g/dL (ref 12.0–15.0)
LYMPHS ABS: 1.1 10*3/uL (ref 0.7–4.0)
Lymphocytes Relative: 22.6 % (ref 12.0–46.0)
MCHC: 33.9 g/dL (ref 30.0–36.0)
MCV: 92.1 fl (ref 78.0–100.0)
MONO ABS: 0.2 10*3/uL (ref 0.1–1.0)
Monocytes Relative: 5.2 % (ref 3.0–12.0)
NEUTROS PCT: 70.9 % (ref 43.0–77.0)
Neutro Abs: 3.3 10*3/uL (ref 1.4–7.7)
Platelets: 205 10*3/uL (ref 150.0–400.0)
RBC: 4.66 Mil/uL (ref 3.87–5.11)
RDW: 13.4 % (ref 11.5–15.5)
WBC: 4.7 10*3/uL (ref 4.0–10.5)

## 2017-02-26 LAB — COMPREHENSIVE METABOLIC PANEL
ALK PHOS: 63 U/L (ref 39–117)
ALT: 20 U/L (ref 0–35)
AST: 16 U/L (ref 0–37)
Albumin: 4.4 g/dL (ref 3.5–5.2)
BUN: 11 mg/dL (ref 6–23)
CO2: 28 meq/L (ref 19–32)
Calcium: 9.7 mg/dL (ref 8.4–10.5)
Chloride: 97 mEq/L (ref 96–112)
Creatinine, Ser: 0.68 mg/dL (ref 0.40–1.20)
GFR: 92.04 mL/min (ref >60.00)
Glucose, Bld: 225 mg/dL — ABNORMAL HIGH (ref 70–99)
POTASSIUM: 4.3 meq/L (ref 3.5–5.1)
Sodium: 137 mEq/L (ref 135–145)
Total Bilirubin: 0.9 mg/dL (ref 0.2–1.2)
Total Protein: 6.1 g/dL (ref 6.0–8.3)

## 2017-02-26 LAB — SEDIMENTATION RATE: Sed Rate: 1 mm/hr (ref 0–30)

## 2017-02-26 LAB — T4, FREE: Free T4: 0.84 ng/dL (ref 0.60–1.60)

## 2017-02-26 LAB — TSH: TSH: 3.28 u[IU]/mL (ref 0.35–4.50)

## 2017-02-26 LAB — VITAMIN B12: Vitamin B-12: 695 pg/mL (ref 211–911)

## 2017-02-26 LAB — HEMOGLOBIN A1C: Hgb A1c MFr Bld: 7.5 % — ABNORMAL HIGH (ref 4.6–6.5)

## 2017-02-26 NOTE — Patient Instructions (Addendum)
Will do podiatry referral   Labs   Today and then plan follow up . Make appt with GI.  If all  okthen cpx in 6 months .

## 2017-02-27 LAB — IRON,TIBC AND FERRITIN PANEL
%SAT: 30 % (calc) (ref 11–50)
Ferritin: 16 ng/mL — ABNORMAL LOW (ref 20–288)
IRON: 107 ug/dL (ref 45–160)
TIBC: 354 ug/dL (ref 250–450)

## 2017-02-27 LAB — CELIAC DISEASE COMPREHENSIVE PANEL WITH REFLEXES
(tTG) Ab, IgA: 1 U/mL
Immunoglobulin A: 181 mg/dL (ref 81–463)

## 2017-02-28 ENCOUNTER — Encounter: Payer: Self-pay | Admitting: Internal Medicine

## 2017-03-04 NOTE — Telephone Encounter (Signed)
Please advise Dr Panosh, thanks.   

## 2017-03-10 NOTE — Telephone Encounter (Signed)
Please do podiatry referral for Dm and  Abnormal foot exam   I have no  Preference  Except in network

## 2017-03-12 ENCOUNTER — Encounter: Payer: Self-pay | Admitting: Internal Medicine

## 2017-03-12 NOTE — Telephone Encounter (Signed)
Pt was able to get an appt and no longer needs another referral.  Nothing further needed.

## 2017-03-17 ENCOUNTER — Other Ambulatory Visit: Payer: Self-pay | Admitting: Internal Medicine

## 2017-03-19 ENCOUNTER — Ambulatory Visit: Payer: PPO | Admitting: Sports Medicine

## 2017-03-19 ENCOUNTER — Encounter: Payer: Self-pay | Admitting: Sports Medicine

## 2017-03-19 VITALS — BP 115/63 | HR 73

## 2017-03-19 DIAGNOSIS — M79671 Pain in right foot: Secondary | ICD-10-CM | POA: Diagnosis not present

## 2017-03-19 DIAGNOSIS — E119 Type 2 diabetes mellitus without complications: Secondary | ICD-10-CM | POA: Diagnosis not present

## 2017-03-19 DIAGNOSIS — L84 Corns and callosities: Secondary | ICD-10-CM

## 2017-03-19 DIAGNOSIS — M79672 Pain in left foot: Secondary | ICD-10-CM

## 2017-03-19 NOTE — Progress Notes (Addendum)
Subjective: Morgan [name] is a 67 y.o. female patient with history of diabetes who presents to office today complaining of pain to callus areas  while ambulating in shoes; unable to trim. Patient states that the glucose reading this morning was not recorded. Patient denies any new changes in medication or new problems. Patient denies any new cramping, numbness, burning or tingling in the legs.  Patient also wants nail fungus to be discussed.  Review of Systems  All other systems reviewed and are negative.    Patient Active Problem List   Diagnosis Date Noted  . Iron deficiency 02/26/2017  . Diabetes mellitus without complication ([place]) 11/91/4782  . Breast symptom left 11/17/2013  . Breast discharge left 11/17/2013  . Recurrent UTI 09/25/2013  . Varicose veins of lower extremities with other complications 95/62/1308  . Medication management 06/20/2012  . First degree AV block 01/17/2012  . Dermatophytosis of foot 11/30/2011  . 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 Polyp at cervical os 04/14/2010  . GERD without esophagitis 03/06/2010  . Adult acne 03/06/2010  . GERD 03/06/2010  . Other acne 03/06/2010  . BLOOD IN STOOL 02/24/2010  . DUODENAL ULCER, HX OF 02/24/2010  . DIABETES MELLITUS, TYPE II 12/09/2009  . Overweight 05/13/2009  . CAD, NATIVE VESSEL 05/13/2009  . HLD (hyperlipidemia) 04/14/2008   Current Outpatient Medications on File Prior to Visit  Medication Sig Dispense Refill  . aspirin 81 MG tablet Take 81 mg by mouth daily.      Marland Kitchen atorvastatin (LIPITOR) 80 MG tablet TAKE 1 TABLET BY MOUTH EVERY DAY AT 6 PM 90 tablet 3  . canagliflozin (INVOKANA) 300 MG TABS tablet TAKE 1 TABLET(300 MG) BY MOUTH DAILY BEFORE BREAKFAST 90 tablet 1  . clindamycin (CLEOCIN) 150 MG capsule Take by mouth. 4 tabs prior to dental work     . Cyanocobalamin (B-12 PO) Take by mouth.    . ezetimibe (ZETIA) 10 MG tablet TAKE 1 TABLET BY MOUTH DAILY 90 tablet 0  .  Ferrous Sulfate (IRON) 325 (65 Fe) MG TABS Take 1 tablet by mouth daily.    Marland Kitchen glucose blood test strip Use as instructed (Patient taking differently: One Touch Ultra:  Use as instructed) 100 each 12  . Insulin Pen Needle (B-D UF III MINI PEN NEEDLES) 31G X 5 MM MISC USE AS DIRECTED. 300 each 3  . liraglutide (VICTOZA) 18 MG/3ML SOPN ADMINISTER 1.8 MG UNDER THE SKIN DAILY 3 pen 5  . metFORMIN (GLUCOPHAGE-XR) 500 MG 24 hr tablet TAKE 2 TABLETS BY MOUTH TWICE DAILY WITH MEALS 360 tablet 1  . Multiple Vitamins-Minerals (MULTIVITAMIN PO) Take by mouth daily.    Marland Kitchen omeprazole (PRILOSEC) 20 MG capsule TAKE 1 CAPSULE BY MOUTH EVERY DAY 30 capsule 2  . ONETOUCH DELICA LANCETS FINE MISC by Does not apply route as directed.     No current facility-administered medications on file prior to visit.    Allergies  Allergen Reactions  . Penicillins     REACTION: as a child     Objective: General: Patient is awake, alert, and oriented x 3 and in no acute distress.  Integument: Skin is warm, dry and supple bilateral. Nails are short and thick and polished, 1-5 bilateral. No signs of infection. + callus at hallux and 4th toes bilateral. Remaining integument unremarkable.  Vasculature:  Dorsalis Pedis pulse 1/4 bilateral. Posterior Tibial pulse  1/4 bilateral.  Capillary fill time <3 sec 1-5 bilateral. Positive hair growth to the  level of the digits. Temperature gradient within normal limits. + varicosities present bilateral. Trace edema present bilateral.   Neurology: The patient has intact sensation measured with a 5.07/10g Semmes Weinstein Monofilament at all pedal sites bilateral. No Babinski sign present bilateral.   Musculoskeletal: Hammertoe, limitus, and pes pedal deformities noted bilateral. Muscular strength 5/5 in all lower extremity muscular groups bilateral without pain on range of motion . No tenderness with calf compression bilateral.  Assessment and Plan: Problem List Items Addressed This  Visit      Endocrine   Diabetes mellitus without complication ([place]) (Chronic)    Other Visit Diagnoses    Callus of foot    -  Primary   Foot pain, bilateral          -Examined patient. -Discussed and educated patient on diabetic foot care, especially with  regards to the vascular, neurological and musculoskeletal systems.  -Stressed the importance of good glycemic control and the detriment of not  controlling glucose levels in relation to the foot. -Mechanically debrided all callus x 4 using sterile chisel blade and filed with dremel without incident  -Answered all patient questions -Patient to return  in 1 month for fungal culture of nails and to discuss orthotics to prevent worsening of callus  -Patient advised to call the office if any problems or questions arise in the meantime.  Landis Martins, DPM

## 2017-03-19 NOTE — Progress Notes (Signed)
   Subjective:    Patient ID: Morgan [name], female    DOB: 11/15/1950, 67 y.o.   MRN: [id number]  HPI    Review of Systems  All other systems reviewed and are negative.      Objective:   Physical Exam        Assessment & Plan:

## 2017-03-20 IMAGING — NM NM MISC PROCEDURE
6 series · 36 of 36 positions shown · non-contrast
Comparison: none

[Series 1: wbr rest · 6.40mm/px · 6 of 64 frames shown]
[frame 6/64]
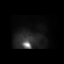
[frame 16/64]
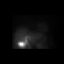
[frame 27/64]
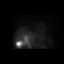
[frame 38/64]
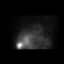
[frame 48/64]
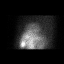
[frame 59/64]
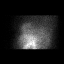

[Series 1: wbr_r-proj_st wbr rest · 6.40mm/px · 6 of 64 frames shown]
[frame 6/64]
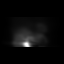
[frame 16/64]
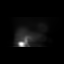
[frame 27/64]
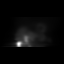
[frame 38/64]
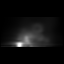
[frame 48/64]
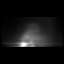
[frame 59/64]
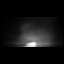

[Series 2: wbr stress-gsp · 6.40mm/px · 6 of 512 frames shown]
[frame 43/512]
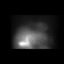
[frame 128/512]
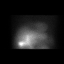
[frame 214/512]
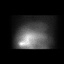
[frame 299/512]
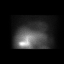
[frame 384/512]
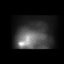
[frame 470/512]
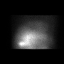

[Series 2: wbr_s-proj_st wbr stress-gsp · 6.40mm/px · 6 of 512 frames shown]
[frame 43/512]
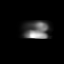
[frame 128/512]
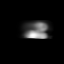
[frame 214/512]
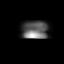
[frame 299/512]
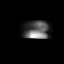
[frame 384/512]
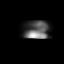
[frame 470/512]
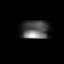

[Series 3: wbr stress-sum-em · 6.40mm/px · 6 of 64 frames shown]
[frame 6/64]
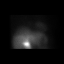
[frame 16/64]
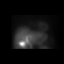
[frame 27/64]
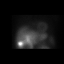
[frame 38/64]
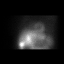
[frame 48/64]
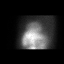
[frame 59/64]
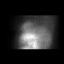

[Series 3: wbr_s-proj_st wbr stress-sum-em · 6.40mm/px · 6 of 64 frames shown]
[frame 6/64]
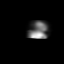
[frame 16/64]
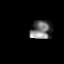
[frame 27/64]
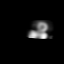
[frame 38/64]
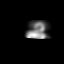
[frame 48/64]
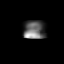
[frame 59/64]
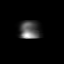

[36 of 36 positions shown; findings below may reference images not displayed]

Canned report from images found in remote index.

Refer to host system for actual result text.

## 2017-03-25 ENCOUNTER — Other Ambulatory Visit: Payer: Self-pay | Admitting: Internal Medicine

## 2017-04-09 ENCOUNTER — Ambulatory Visit: Payer: PPO | Admitting: Sports Medicine

## 2017-04-09 ENCOUNTER — Encounter: Payer: Self-pay | Admitting: Sports Medicine

## 2017-04-09 DIAGNOSIS — B351 Tinea unguium: Secondary | ICD-10-CM | POA: Diagnosis not present

## 2017-04-09 DIAGNOSIS — E119 Type 2 diabetes mellitus without complications: Secondary | ICD-10-CM

## 2017-04-09 DIAGNOSIS — M79671 Pain in right foot: Secondary | ICD-10-CM

## 2017-04-09 DIAGNOSIS — L84 Corns and callosities: Secondary | ICD-10-CM

## 2017-04-09 DIAGNOSIS — M79672 Pain in left foot: Secondary | ICD-10-CM

## 2017-04-09 DIAGNOSIS — L603 Nail dystrophy: Secondary | ICD-10-CM | POA: Diagnosis not present

## 2017-04-09 NOTE — Progress Notes (Signed)
Subjective: Morgan [name] is a 67 y.o. female patient with history of diabetes who returns to office today for callus check and to discuss treatment options for nail changes. Patient states that the glucose reading this morning was not recorded. Patient states that the toe cushion last visit would not stay on. Denies any other issues.   Patient Active Problem List   Diagnosis Date Noted  . Iron deficiency 02/26/2017  . Diabetes mellitus without complication ([place]) 96/05/5407  . Breast symptom left 11/17/2013  . Breast discharge left 11/17/2013  . Recurrent UTI 09/25/2013  . Varicose veins of lower extremities with other complications 81/19/1478  . Medication management 06/20/2012  . First degree AV block 01/17/2012  . Dermatophytosis of foot 11/30/2011  . 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 Polyp at cervical os 04/14/2010  . GERD without esophagitis 03/06/2010  . Adult acne 03/06/2010  . GERD 03/06/2010  . Other acne 03/06/2010  . BLOOD IN STOOL 02/24/2010  . DUODENAL ULCER, HX OF 02/24/2010  . DIABETES MELLITUS, TYPE II 12/09/2009  . Overweight 05/13/2009  . CAD, NATIVE VESSEL 05/13/2009  . HLD (hyperlipidemia) 04/14/2008   Current Outpatient Medications on File Prior to Visit  Medication Sig Dispense Refill  . aspirin 81 MG tablet Take 81 mg by mouth daily.      Marland Kitchen atorvastatin (LIPITOR) 80 MG tablet TAKE 1 TABLET BY MOUTH EVERY DAY AT 6 PM 90 tablet 3  . canagliflozin (INVOKANA) 300 MG TABS tablet TAKE 1 TABLET(300 MG) BY MOUTH DAILY BEFORE BREAKFAST 90 tablet 1  . clindamycin (CLEOCIN) 150 MG capsule Take by mouth. 4 tabs prior to dental work     . Cyanocobalamin (B-12 PO) Take by mouth.    . ezetimibe (ZETIA) 10 MG tablet TAKE 1 TABLET BY MOUTH DAILY 90 tablet 0  . Ferrous Sulfate (IRON) 325 (65 Fe) MG TABS Take 1 tablet by mouth daily.    Marland Kitchen glucose blood test strip Use as instructed (Patient taking differently: One Touch Ultra:  Use as  instructed) 100 each 12  . Insulin Pen Needle (B-D UF III MINI PEN NEEDLES) 31G X 5 MM MISC USE AS DIRECTED. 300 each 3  . liraglutide (VICTOZA) 18 MG/3ML SOPN ADMINISTER 1.8 MG UNDER THE SKIN DAILY 3 pen 5  . metFORMIN (GLUCOPHAGE-XR) 500 MG 24 hr tablet TAKE 2 TABLETS BY MOUTH TWICE DAILY WITH MEALS 360 tablet 1  . Multiple Vitamins-Minerals (MULTIVITAMIN PO) Take by mouth daily.    Marland Kitchen omeprazole (PRILOSEC) 20 MG capsule TAKE 1 CAPSULE BY MOUTH EVERY DAY 30 capsule 6  . ONETOUCH DELICA LANCETS FINE MISC by Does not apply route as directed.     No current facility-administered medications on file prior to visit.    Allergies  Allergen Reactions  . Penicillins     REACTION: as a child     Objective: General: Patient is awake, alert, and oriented x 3 and in no acute distress.  Integument: Skin is warm, dry and supple bilateral. Nails are short and thick with distal white nail changes and yellow discoloration since patient has removed polish for me to see this visit on nails 1-5 bilateral. + minimal callus at hallux and 4th toes bilateral. Remaining integument unremarkable.  Vasculature:  Dorsalis Pedis pulse 1/4 bilateral. Posterior Tibial pulse  1/4 bilateral. Capillary fill time <3 sec 1-5 bilateral. Positive hair growth to the level of the digits.Temperature gradient within normal limits. + varicosities present bilateral. Trace edema present bilateral.  Neurology: The patient has intact sensation measured with a 5.07/10g Semmes Weinstein Monofilament at all pedal sites bilateral. No Babinski sign present bilateral.   Musculoskeletal: Hammertoe, limitus, and pes pedal deformities noted bilateral. Muscular strength 5/5 in all lower extremity muscular groups bilateral without pain on range of motion. No tenderness with calf compression bilateral.  Assessment and Plan: Problem List Items Addressed This Visit      Endocrine   Diabetes mellitus without complication ([place]) (Chronic)     Other Visit Diagnoses    Dermatophytosis of nail    -  Primary   Relevant Orders   Culture, fungus without smear   Foot pain, bilateral       Callus of foot         -Examined patient. -Nails 1-10 were biospied using aseptic technique with removal of distal hard nail plate itself using sterile nail nipper without incident and sent to [facility] for fungal culture; patient tolerated the procedure well without need for anesthesia or dressings to the toes.  -For callus to toes advised patient that offloading insoles may help to slow the recurrence of callus; office to check orthotic benefits -Answered all patient questions -Patient to return in 1 month for fungal culture results -Patient advised to call the office if any problems or questions arise in the meantime.  Landis Martins, DPM

## 2017-05-14 ENCOUNTER — Other Ambulatory Visit: Payer: Self-pay | Admitting: Internal Medicine

## 2017-05-14 ENCOUNTER — Ambulatory Visit: Payer: PPO | Admitting: Sports Medicine

## 2017-05-14 ENCOUNTER — Encounter: Payer: Self-pay | Admitting: Sports Medicine

## 2017-05-14 DIAGNOSIS — E119 Type 2 diabetes mellitus without complications: Secondary | ICD-10-CM | POA: Diagnosis not present

## 2017-05-14 DIAGNOSIS — B351 Tinea unguium: Secondary | ICD-10-CM | POA: Diagnosis not present

## 2017-05-14 DIAGNOSIS — M79672 Pain in left foot: Secondary | ICD-10-CM

## 2017-05-14 DIAGNOSIS — M79671 Pain in right foot: Secondary | ICD-10-CM

## 2017-05-14 MED ORDER — TERBINAFINE HCL 250 MG PO TABS: 250.0000 mg | ORAL_TABLET | Freq: Every day | ORAL | 0 refills | Status: DC

## 2017-05-14 NOTE — Progress Notes (Signed)
Subjective: Morgan [name] is a 67 y.o. female patient seen today in office for fungal culture results. Patient has no other pedal complaints at this time.   Patient Active Problem List   Diagnosis Date Noted  . Iron deficiency 02/26/2017  . Diabetes mellitus without complication ([place]) 38/18/2993  . Breast symptom left 11/17/2013  . Breast discharge left 11/17/2013  . Recurrent UTI 09/25/2013  . Varicose veins of lower extremities with other complications 71/69/6789  . Medication management 06/20/2012  . First degree AV block 01/17/2012  . Dermatophytosis of foot 11/30/2011  . 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 Polyp at cervical os 04/14/2010  . GERD without esophagitis 03/06/2010  . Adult acne 03/06/2010  . GERD 03/06/2010  . Other acne 03/06/2010  . BLOOD IN STOOL 02/24/2010  . DUODENAL ULCER, HX OF 02/24/2010  . DIABETES MELLITUS, TYPE II 12/09/2009  . Overweight 05/13/2009  . CAD, NATIVE VESSEL 05/13/2009  . HLD (hyperlipidemia) 04/14/2008    Current Outpatient Medications on File Prior to Visit  Medication Sig Dispense Refill  . aspirin 81 MG tablet Take 81 mg by mouth daily.      Marland Kitchen atorvastatin (LIPITOR) 80 MG tablet TAKE 1 TABLET BY MOUTH EVERY DAY AT 6 PM 90 tablet 3  . canagliflozin (INVOKANA) 300 MG TABS tablet TAKE 1 TABLET(300 MG) BY MOUTH DAILY BEFORE BREAKFAST 90 tablet 1  . clindamycin (CLEOCIN) 150 MG capsule Take by mouth. 4 tabs prior to dental work     . Cyanocobalamin (B-12 PO) Take by mouth.    . ezetimibe (ZETIA) 10 MG tablet TAKE 1 TABLET BY MOUTH DAILY 90 tablet 0  . Ferrous Sulfate (IRON) 325 (65 Fe) MG TABS Take 1 tablet by mouth daily.    Marland Kitchen glucose blood test strip Use as instructed (Patient taking differently: One Touch Ultra:  Use as instructed) 100 each 12  . Insulin Pen Needle (B-D UF III MINI PEN NEEDLES) 31G X 5 MM MISC USE AS DIRECTED. 300 each 3  . liraglutide (VICTOZA) 18 MG/3ML SOPN ADMINISTER 1.8 MG UNDER  THE SKIN DAILY 3 pen 5  . metFORMIN (GLUCOPHAGE-XR) 500 MG 24 hr tablet TAKE 2 TABLETS BY MOUTH TWICE DAILY WITH MEALS 360 tablet 1  . Multiple Vitamins-Minerals (MULTIVITAMIN PO) Take by mouth daily.    Marland Kitchen omeprazole (PRILOSEC) 20 MG capsule TAKE 1 CAPSULE BY MOUTH EVERY DAY 30 capsule 6  . ONETOUCH DELICA LANCETS FINE MISC by Does not apply route as directed.     No current facility-administered medications on file prior to visit.     Allergies  Allergen Reactions  . Penicillins     REACTION: as a child    Objective: Physical Exam  General: Well developed, nourished, no acute distress, awake, alert and oriented x 3  Integument: Skin is warm, dry and supple bilateral. Nails are short and thick with distal white nail changes and yellow discoloration since patient has removed polish for me to see this visit on nails 1-5 bilateral. + minimal callus at hallux and 4th toes bilateral. Remaining integument unremarkable.  Vasculature:  Dorsalis Pedis pulse 1/4 bilateral. Posterior Tibial pulse  1/4 bilateral. Capillary fill time <3 sec 1-5 bilateral. Positive hair growth to the level of the digits.Temperature gradient within normal limits. + varicosities present bilateral. Trace edema present bilateral.   Neurology: The patient has intact sensation measured with a 5.07/10g Semmes Weinstein Monofilament at all pedal sites bilateral. No Babinski sign present bilateral.  Musculoskeletal: Hammertoe, limitus, and pes pedal deformities noted bilateral. Muscular strength 5/5 in all lower extremity muscular groups bilateral without pain on range of motion. No tenderness with calf compression bilateral.  Fungal culture + Saph Mold  Assessment and Plan:  Problem List Items Addressed This Visit      Endocrine   Diabetes mellitus without complication ([place]) (Chronic)    Other Visit Diagnoses    Dermatophytosis of nail    -  Primary   Foot pain, bilateral          -Examined patient -Discussed  treatment options for painful mycotic nails -Patient opt for oral Lamisil with full understanding of medication risks; Previous LFTs reviewed and are normal, will proceed with sending Rx to pharmacy for lamisil 250mg  PO daily. Anticipate 12 week course.  -Advised good hygiene habits -Patient to return in 6 weeks for follow up evaluation or sooner if symptoms worsen.  Landis Martins, DPM

## 2017-05-31 ENCOUNTER — Ambulatory Visit: Payer: PPO | Admitting: Gastroenterology

## 2017-05-31 ENCOUNTER — Encounter: Payer: Self-pay | Admitting: Gastroenterology

## 2017-05-31 ENCOUNTER — Other Ambulatory Visit: Payer: Self-pay | Admitting: Gastroenterology

## 2017-05-31 VITALS — BP 124/78 | HR 72 | Ht 65.5 in | Wt 167.1 lb

## 2017-05-31 DIAGNOSIS — D508 Other iron deficiency anemias: Secondary | ICD-10-CM | POA: Diagnosis not present

## 2017-05-31 DIAGNOSIS — K2930 Chronic superficial gastritis without bleeding: Secondary | ICD-10-CM | POA: Diagnosis not present

## 2017-05-31 DIAGNOSIS — Z8711 Personal history of peptic ulcer disease: Secondary | ICD-10-CM

## 2017-05-31 MED ORDER — NA SULFATE-K SULFATE-MG SULF 17.5-3.13-1.6 GM/177ML PO SOLN: 1.0000 | Freq: Once | ORAL | 0 refills | Status: AC

## 2017-05-31 NOTE — Patient Instructions (Signed)
You have been scheduled for an endoscopy and colonoscopy. Please follow the written instructions given to you at your visit today. Please pick up your prep supplies at the pharmacy within the next 1-3 days. If you use inhalers (even only as needed), please bring them with you on the day of your procedure.   If you are age 66 or older, your body mass index should be between 23-30. Your Body mass index is 27.39 kg/m. If this is out of the aforementioned range listed, please consider follow up with your Primary Care Provider.  If you are age 56 or younger, your body mass index should be between 19-25. Your Body mass index is 27.39 kg/m. If this is out of the aformentioned range listed, please consider follow up with your Primary Care Provider.

## 2017-05-31 NOTE — Progress Notes (Signed)
Morgan [name]    [id number]    February 25, 1950  Primary Care Physician:Panosh, Standley Brooking, MD  Referring Physician: Burnis Medin, MD [place], [place] [address]  Chief complaint: Iron deficiency weight loss  HPI:  67 year old female previously followed by Dr. Olevia Perches, last seen in office December 11, 2012 is here to reestablish care. She has history of chronic GERD and gastric ulcer along with gastritis.  Gastric biopsies showed gastric atypia, atrophic gastritis and intestinal metaplasia on antral biopsies April 2012, repeat EGD in December 2012 showed healed gastric ulcer in the pyloric channel, biopsies were positive for intestinal metaplasia.  Most recent EGD December 2015 showed minimal antral gastritis, biopsies showed chronic mild gastritis with no dysplasia, intestinal metaplasia and negative for H. Pylori.  Recent labs February 26, 2017 with findings of iron deficiency, low ferritin of 16 despite patient being on oral iron supplements  Denies any overt melena or blood per rectum.  No nausea, vomiting, dysphagia, odynophagia or abdominal pain.  She has chronic irritable bowel syndrome with alternating constipation and diarrhea. She recently has lost 9 lbs since Jan with diet changes and exercise Colonoscopy May 15, 2010 normal with no polyps  Outpatient Encounter Medications as of 05/31/2017  Medication Sig  . aspirin 81 MG tablet Take 81 mg by mouth daily.    Marland Kitchen atorvastatin (LIPITOR) 80 MG tablet TAKE 1 TABLET BY MOUTH EVERY DAY AT 6 PM  . canagliflozin (INVOKANA) 300 MG TABS tablet TAKE 1 TABLET(300 MG) BY MOUTH DAILY BEFORE BREAKFAST  . clindamycin (CLEOCIN) 150 MG capsule Take by mouth. 4 tabs prior to dental work   . Cyanocobalamin (B-12 PO) Take by mouth.  . ezetimibe (ZETIA) 10 MG tablet TAKE 1 TABLET BY MOUTH EVERY DAY  . Ferrous Sulfate (IRON) 325 (65 Fe) MG TABS Take 1 tablet by mouth daily.  Marland Kitchen glucose blood test strip Use as instructed  (Patient taking differently: One Touch Ultra:  Use as instructed)  . Insulin Pen Needle (B-D UF III MINI PEN NEEDLES) 31G X 5 MM MISC USE AS DIRECTED.  Marland Kitchen liraglutide (VICTOZA) 18 MG/3ML SOPN ADMINISTER 1.8 MG UNDER THE SKIN DAILY  . metFORMIN (GLUCOPHAGE-XR) 500 MG 24 hr tablet TAKE 2 TABLETS BY MOUTH TWICE DAILY WITH MEALS  . Multiple Vitamins-Minerals (MULTIVITAMIN PO) Take by mouth daily.  Marland Kitchen omeprazole (PRILOSEC) 20 MG capsule TAKE 1 CAPSULE BY MOUTH EVERY DAY  . ONETOUCH DELICA LANCETS FINE MISC by Does not apply route as directed.  . terbinafine (LAMISIL) 250 MG tablet Take 1 tablet (250 mg total) by mouth daily.   No facility-administered encounter medications on file as of 05/31/2017.     Allergies as of 05/31/2017 - Review Complete 05/31/2017  Allergen Reaction Noted  . Penicillins  02/24/2010    Past Medical History:  Diagnosis Date  . Anemia    as a child   . CAD, NATIVE VESSEL 05/13/2009  . DIABETES MELLITUS, TYPE II 12/09/2009  . Gastric ulcer   . Gastritis   . GERD (gastroesophageal reflux disease)   . Hx of abnormal cervical Pap smear    cryo  but normal since then   . HYPERLIPIDEMIA-MIXED 04/14/2008  . Intestinal metaplasia of gastric mucosa 2012  . Myocardial infarction ([place])    2003  . Obesity   . Occult blood in stools   . Osteopenia   . OVERWEIGHT/OBESITY 05/13/2009  . Ulcer    Duodenal ulcer by  x-ray 40 years ago  . Varicose veins    under rx [facility] 2016    Past Surgical History:  Procedure Laterality Date  . CESAREAN SECTION     x2  . CLOSED REDUCTION PROXIMAL TIBIOFIBULAR JOINT [place]  2004   left  . COLONOSCOPY    . DILATION AND CURETTAGE OF UTERUS    . ECTOPIC PREGNANCY SURGERY    . FIBULA FRACTURE SURGERY    . GANGLION CYST EXCISION    . TONSILLECTOMY    . TUBAL LIGATION     One Tube  . UPPER GASTROINTESTINAL ENDOSCOPY      Family History  Problem Relation Age of Onset  . Emphysema Mother        Smoker  . Heart attack Mother         due to head injury  . Alcohol abuse Mother   . Heart disease Mother   . Early death Mother   . Varicose Veins Mother   . AAA (abdominal aortic aneurysm) Mother   . Lung cancer Father   . Heart attack Father   . Hypertension Father   . Alcohol abuse Father   . Heart disease Father        before age 78  . Aneurysm Father   . Hyperlipidemia Father   . Alcohol abuse Brother   . Diabetes Brother   . Heart disease Brother   . Heart attack Brother   . Diabetes Brother   . Aneurysm Paternal Grandmother   . Diabetes Unknown        grandfather  . Sudden Cardiac Death Brother   . Colon cancer Neg Hx   . Esophageal cancer Neg Hx   . Stomach cancer Neg Hx     Social History   Socioeconomic History  . Marital status: Widowed    Spouse name: Not on file  . Number of children: 2  . Years of education: phd   . Highest education level: Not on file  Occupational History  . Occupation: Mental Health Counselor    Comment: [employer]  . Occupation: Nurse, adult: [employer]  . Financial resource strain: Not on file  . Food insecurity:    Worry: Not on file    Inability: Not on file  . Transportation needs:    Medical: Not on file    Non-medical: Not on file  Tobacco Use  . Smoking status: Former Smoker    Last attempt to quit: 02/05/1994    Years since quitting: 23.3  . Smokeless tobacco: Never Used  Substance and Sexual Activity  . Alcohol use: No    Alcohol/week: 0.0 oz  . Drug use: No  . Sexual activity: Not on file  Lifestyle  . Physical activity:    Days per week: Not on file    Minutes per session: Not on file  . Stress: Not on file  Relationships  . Social connections:    Talks on phone: Not on file    Gets together: Not on file    Attends religious service: Not on file    Active member of club or organization: Not on file    Attends meetings of clubs or organizations: Not on file    Relationship status: Not on file  .  Intimate partner violence:    Fear of current or ex partner: Not on file    Emotionally abused: Not on file    Physically abused: Not on  file    Forced sexual activity: Not on file  Other Topics Concern  . 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    No firearms    NO reg exercise    Pets- Poodles 2    Sleep 7 hours       Review of systems: Review of Systems  Constitutional: Negative for fever and chills.  HENT: Positive for ringing ears Eyes: Negative for blurred vision.  Respiratory: Negative for cough, shortness of breath and wheezing.   Cardiovascular: Negative for chest pain and palpitations.  Gastrointestinal: as per HPI Genitourinary: Negative for dysuria, urgency, frequency and hematuria.  History of frequent UTI Musculoskeletal: Negative for myalgias, back pain and joint pain.  Skin: Negative for itching and rash.  Neurological: Negative for dizziness, tremors, focal weakness, seizures and loss of consciousness.  Endo/Heme/Allergies: Positive for seasonal allergies.  Psychiatric/Behavioral: Negative for depression, suicidal ideas and hallucinations.  All other systems reviewed and are negative.   Physical Exam: Vitals:   05/31/17 1008  BP: 124/78  Pulse: 72   Body mass index is 27.39 kg/m. Gen:      No acute distress HEENT:  EOMI, sclera anicteric Neck:     No masses; no thyromegaly Lungs:    Clear to auscultation bilaterally; normal respiratory effort CV:         Regular rate and rhythm; no murmurs Abd:      + bowel sounds; soft, non-tender; no palpable masses, no distension Ext:    No edema; adequate peripheral perfusion Skin:      Warm and dry; no rash Neuro: alert and oriented x 3 Psych: normal mood and affect  Data Reviewed:  Reviewed labs,  radiology imaging, old records and pertinent past GI work up   Assessment and Plan/Recommendations:  67 year old female with history of chronic gastritis, peptic ulcer disease with complaints of iron deficiency Recent weight loss intentional secondary to dietary changes and exercise She has history of atrophic gastritis, gastric atypia and intestinal metaplasia Will proceed with EGD and colonoscopy for evaluation of iron deficiency The risks and benefits as well as alternatives of endoscopic procedure(s) have been discussed and reviewed. All questions answered. The patient agrees to proceed.    Greater than 50% of the time used for counseling as well as treatment plan and follow-up. She had multiple questions which were answered to her satisfaction  K. Denzil Magnuson , MD [phone]    CC: Panosh, Standley Brooking, MD

## 2017-06-19 ENCOUNTER — Other Ambulatory Visit: Payer: Self-pay | Admitting: Internal Medicine

## 2017-06-23 ENCOUNTER — Other Ambulatory Visit: Payer: Self-pay | Admitting: Internal Medicine

## 2017-06-25 ENCOUNTER — Encounter: Payer: Self-pay | Admitting: Sports Medicine

## 2017-06-25 ENCOUNTER — Ambulatory Visit: Payer: PPO | Admitting: Sports Medicine

## 2017-06-25 DIAGNOSIS — B351 Tinea unguium: Secondary | ICD-10-CM

## 2017-06-25 DIAGNOSIS — Z79899 Other long term (current) drug therapy: Secondary | ICD-10-CM

## 2017-06-25 DIAGNOSIS — E119 Type 2 diabetes mellitus without complications: Secondary | ICD-10-CM

## 2017-06-25 NOTE — Progress Notes (Signed)
Subjective: Morgan [name] is a 67 y.o. female patient seen today in office for follow up evaluation of nail saphrophytic mold on Lamisil. Patient states that she is doing well with no adverse reaction, thinks that it is helping. Patient has no other pedal complaints at this time.   Review of Systems  All other systems reviewed and are negative.   Patient Active Problem List   Diagnosis Date Noted  . Iron deficiency 02/26/2017  . Diabetes mellitus without complication ([place]) 07/37/1062  . Breast symptom left 11/17/2013  . Breast discharge left 11/17/2013  . Recurrent UTI 09/25/2013  . Varicose veins of lower extremities with other complications 69/48/5462  . Medication management 06/20/2012  . First degree AV block 01/17/2012  . Dermatophytosis of foot 11/30/2011  . 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 Polyp at cervical os 04/14/2010  . GERD without esophagitis 03/06/2010  . Adult acne 03/06/2010  . GERD 03/06/2010  . Other acne 03/06/2010  . BLOOD IN STOOL 02/24/2010  . DUODENAL ULCER, HX OF 02/24/2010  . DIABETES MELLITUS, TYPE II 12/09/2009  . Overweight 05/13/2009  . CAD, NATIVE VESSEL 05/13/2009  . HLD (hyperlipidemia) 04/14/2008    Current Outpatient Medications on File Prior to Visit  Medication Sig Dispense Refill  . aspirin 81 MG tablet Take 81 mg by mouth daily.      Marland Kitchen atorvastatin (LIPITOR) 80 MG tablet TAKE 1 TABLET BY MOUTH EVERY DAY AT 6 PM 90 tablet 3  . canagliflozin (INVOKANA) 300 MG TABS tablet TAKE 1 TABLET(300 MG) BY MOUTH DAILY BEFORE BREAKFAST 90 tablet 1  . clindamycin (CLEOCIN) 150 MG capsule Take by mouth. 4 tabs prior to dental work     . Cyanocobalamin (B-12 PO) Take by mouth.    . ezetimibe (ZETIA) 10 MG tablet TAKE 1 TABLET BY MOUTH EVERY DAY 90 tablet 0  . Ferrous Sulfate (IRON) 325 (65 Fe) MG TABS Take 1 tablet by mouth daily.    Marland Kitchen glucose blood test strip Use as instructed (Patient taking differently: One Touch  Ultra:  Use as instructed) 100 each 12  . Insulin Pen Needle (B-D UF III MINI PEN NEEDLES) 31G X 5 MM MISC USE AS DIRECTED. 300 each 3  . liraglutide (VICTOZA) 18 MG/3ML SOPN INJECT 1.8 MGUNDER THE SKIN DAILY 9 mL 1  . liraglutide (VICTOZA) 18 MG/3ML SOPN INJECT 1.8 MGUNDER THE SKIN DAILY 9 mL 1  . metFORMIN (GLUCOPHAGE-XR) 500 MG 24 hr tablet TAKE 2 TABLETS BY MOUTH TWICE DAILY WITH MEALS 360 tablet 1  . Multiple Vitamins-Minerals (MULTIVITAMIN PO) Take by mouth daily.    Marland Kitchen omeprazole (PRILOSEC) 20 MG capsule TAKE 1 CAPSULE BY MOUTH EVERY DAY 30 capsule 6  . ONETOUCH DELICA LANCETS FINE MISC by Does not apply route as directed.    . terbinafine (LAMISIL) 250 MG tablet Take 1 tablet (250 mg total) by mouth daily. 90 tablet 0   No current facility-administered medications on file prior to visit.     Allergies  Allergen Reactions  . Penicillins     REACTION: as a child    Objective: Physical Exam  General: Well developed, nourished, no acute distress, awake, alert and oriented x 3  Vascular: Dorsalis pedis artery 1/4 bilateral, Posterior tibial artery 1/4 bilateral, skin temperature warm to warm proximal to distal bilateral lower extremities, no varicosities, pedal hair present bilateral.  Neurological: Gross sensation present via light touch bilateral.   Dermatological: Skin is warm, dry, and supple bilateral,  Nails 1-10 are tender, short thick, and discolored with mild subungal debris and early clearance noted at proximal nail bed, no webspace macerations present bilateral, no open lesions present bilateral, minimal hyperkeratotic tissue present hallux and 4th toe. No signs of infection bilateral.  Musculoskeletal: No symptomatic boney deformities noted bilateral. Muscular strength within normal limits without painon range of motion. No pain with calf compression bilateral.  Assessment and Plan:  Problem List Items Addressed This Visit      Endocrine   Diabetes mellitus without  complication ([place]) (Chronic)    Other Visit Diagnoses    Encounter for long-term (current) use of high-risk medication    -  Primary   Nail fungus       Relevant Orders   Hepatic Function Panel      -Examined patient -Cont with Lamisil; a new set of LFTs were ordered; will call patient to stop medication if abnormal  -Advised good hygiene habits and educated patient on proper foot care to prevent re-infection -Patient to return in 6 weeks for follow up evaluation or sooner if symptoms worsen.  Landis Martins, DPM

## 2017-06-26 LAB — HEPATIC FUNCTION PANEL
AG Ratio: 2.1 (calc) (ref 1.0–2.5)
ALT: 21 U/L (ref 6–29)
AST: 16 U/L (ref 10–35)
Albumin: 4.4 g/dL (ref 3.6–5.1)
Alkaline phosphatase (APISO): 67 U/L (ref 33–130)
BILIRUBIN DIRECT: 0.2 mg/dL (ref 0.0–0.2)
BILIRUBIN INDIRECT: 0.5 mg/dL (ref 0.2–1.2)
GLOBULIN: 2.1 g/dL (ref 1.9–3.7)
Total Bilirubin: 0.7 mg/dL (ref 0.2–1.2)
Total Protein: 6.5 g/dL (ref 6.1–8.1)

## 2017-07-02 ENCOUNTER — Encounter: Payer: Self-pay | Admitting: Internal Medicine

## 2017-07-03 MED ORDER — LIRAGLUTIDE 18 MG/3ML ~~LOC~~ SOPN: PEN_INJECTOR | SUBCUTANEOUS | 1 refills | Status: DC

## 2017-07-18 ENCOUNTER — Other Ambulatory Visit: Payer: Self-pay | Admitting: Internal Medicine

## 2017-07-25 ENCOUNTER — Other Ambulatory Visit: Payer: Self-pay | Admitting: Internal Medicine

## 2017-07-31 ENCOUNTER — Encounter: Payer: Self-pay | Admitting: Internal Medicine

## 2017-07-31 MED ORDER — ONETOUCH DELICA LANCETS FINE MISC: 0 refills | Status: DC

## 2017-08-06 ENCOUNTER — Encounter: Payer: Self-pay | Admitting: Sports Medicine

## 2017-08-06 ENCOUNTER — Ambulatory Visit: Payer: PPO | Admitting: Sports Medicine

## 2017-08-06 DIAGNOSIS — E119 Type 2 diabetes mellitus without complications: Secondary | ICD-10-CM | POA: Diagnosis not present

## 2017-08-06 DIAGNOSIS — Z79899 Other long term (current) drug therapy: Secondary | ICD-10-CM | POA: Diagnosis not present

## 2017-08-06 DIAGNOSIS — B351 Tinea unguium: Secondary | ICD-10-CM

## 2017-08-06 NOTE — Progress Notes (Signed)
Subjective: Morgan [name] is a 67 y.o. female patient seen today in office for follow up evaluation of nail saphrophytic mold on Lamisil, stared 05-14-17. Patient states that she is doing well with no adverse reaction, thinks that it looks the same as last time. Denies nausea, vomiting, fever or chills. Patient has no other pedal complaints at this time.   Patient Active Problem List   Diagnosis Date Noted  . Iron deficiency 02/26/2017  . Diabetes mellitus without complication ([place]) 53/66/4403  . Breast symptom left 11/17/2013  . Breast discharge left 11/17/2013  . Recurrent UTI 09/25/2013  . Varicose veins of lower extremities with other complications 47/42/5956  . Medication management 06/20/2012  . First degree AV block 01/17/2012  . Dermatophytosis of foot 11/30/2011  . 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 Polyp at cervical os 04/14/2010  . GERD without esophagitis 03/06/2010  . Adult acne 03/06/2010  . GERD 03/06/2010  . Other acne 03/06/2010  . BLOOD IN STOOL 02/24/2010  . DUODENAL ULCER, HX OF 02/24/2010  . DIABETES MELLITUS, TYPE II 12/09/2009  . Overweight 05/13/2009  . CAD, NATIVE VESSEL 05/13/2009  . HLD (hyperlipidemia) 04/14/2008    Current Outpatient Medications on File Prior to Visit  Medication Sig Dispense Refill  . aspirin 81 MG tablet Take 81 mg by mouth daily.      Marland Kitchen atorvastatin (LIPITOR) 80 MG tablet TAKE 1 TABLET BY MOUTH EVERY DAY AT 6 PM 90 tablet 3  . clindamycin (CLEOCIN) 150 MG capsule Take by mouth. 4 tabs prior to dental work     . Cyanocobalamin (B-12 PO) Take by mouth.    . ezetimibe (ZETIA) 10 MG tablet TAKE 1 TABLET BY MOUTH EVERY DAY 90 tablet 0  . Ferrous Sulfate (IRON) 325 (65 Fe) MG TABS Take 1 tablet by mouth daily.    Marland Kitchen glucose blood (ONE TOUCH ULTRA TEST) test strip One Touch Ultra:  Use as instructed 100 each 12  . Insulin Pen Needle (B-D UF III MINI PEN NEEDLES) 31G X 5 MM MISC USE AS DIRECTED. 300 each 3   . INVOKANA 300 MG TABS tablet TAKE 1 TABLET(300 MG) BY MOUTH DAILY BEFORE BREAKFAST 90 tablet 0  . liraglutide (VICTOZA) 18 MG/3ML SOPN INJECT 1.8 MGUNDER THE SKIN DAILY 9 mL 1  . liraglutide (VICTOZA) 18 MG/3ML SOPN INJECT 1.8 MGUNDER THE SKIN DAILY 27 mL 1  . metFORMIN (GLUCOPHAGE-XR) 500 MG 24 hr tablet TAKE 2 TABLETS BY MOUTH TWICE DAILY WITH MEALS 360 tablet 1  . Multiple Vitamins-Minerals (MULTIVITAMIN PO) Take by mouth daily.    Marland Kitchen omeprazole (PRILOSEC) 20 MG capsule TAKE 1 CAPSULE BY MOUTH EVERY DAY 30 capsule 6  . ONETOUCH DELICA LANCETS FINE MISC USE AS DIRECTED 100 each 0  . terbinafine (LAMISIL) 250 MG tablet Take 1 tablet (250 mg total) by mouth daily. 90 tablet 0   No current facility-administered medications on file prior to visit.     Allergies  Allergen Reactions  . Penicillins     REACTION: as a child    Objective: Physical Exam  General: Well developed, nourished, no acute distress, awake, alert and oriented x 3  Vascular: Dorsalis pedis artery 1/4 bilateral, Posterior tibial artery 1/4 bilateral, skin temperature warm to warm proximal to distal bilateral lower extremities, no varicosities, pedal hair present bilateral.  Neurological: Gross sensation present via light touch bilateral.   Dermatological: Skin is warm, dry, and supple bilateral, Nails 1-10 are tender, short thick, and  discolored with mild subungal debris and early clearance noted at proximal nail bed, no webspace macerations present bilateral, no open lesions present bilateral, minimal hyperkeratotic tissue present hallux and 4th toe. No signs of infection bilateral.  Musculoskeletal: No symptomatic boney deformities noted bilateral. Muscular strength within normal limits without painon range of motion. No pain with calf compression bilateral.  Assessment and Plan:  Problem List Items Addressed This Visit      Endocrine   Diabetes mellitus without complication ([place]) (Chronic)    Other Visit  Diagnoses    Encounter for long-term (current) use of high-risk medication    -  Primary   Nail fungus          -Examined patient -Cont with Lamisil until completed  -Advised good hygiene habits and educated patient on proper foot care to prevent re-infection -Advised patient to give the medication 1 year to allow nails to continue to grow out  -Patient to return as needed or sooner if symptoms worsen.  Landis Martins, DPM

## 2017-08-08 ENCOUNTER — Other Ambulatory Visit: Payer: Self-pay | Admitting: Cardiology

## 2017-08-13 DIAGNOSIS — H524 Presbyopia: Secondary | ICD-10-CM | POA: Diagnosis not present

## 2017-08-14 ENCOUNTER — Encounter: Payer: Self-pay | Admitting: Gastroenterology

## 2017-08-26 ENCOUNTER — Encounter: Payer: Self-pay | Admitting: Gastroenterology

## 2017-08-26 ENCOUNTER — Ambulatory Visit (AMBULATORY_SURGERY_CENTER): Payer: PPO | Admitting: Gastroenterology

## 2017-08-26 VITALS — BP 129/73 | HR 65 | Temp 98.0°F | Resp 14 | Ht 65.0 in | Wt 167.0 lb

## 2017-08-26 DIAGNOSIS — D122 Benign neoplasm of ascending colon: Secondary | ICD-10-CM

## 2017-08-26 DIAGNOSIS — K219 Gastro-esophageal reflux disease without esophagitis: Secondary | ICD-10-CM | POA: Diagnosis not present

## 2017-08-26 DIAGNOSIS — K319 Disease of stomach and duodenum, unspecified: Secondary | ICD-10-CM

## 2017-08-26 DIAGNOSIS — D508 Other iron deficiency anemias: Secondary | ICD-10-CM

## 2017-08-26 DIAGNOSIS — Z8711 Personal history of peptic ulcer disease: Secondary | ICD-10-CM

## 2017-08-26 DIAGNOSIS — K3189 Other diseases of stomach and duodenum: Secondary | ICD-10-CM

## 2017-08-26 DIAGNOSIS — Z1211 Encounter for screening for malignant neoplasm of colon: Secondary | ICD-10-CM | POA: Diagnosis not present

## 2017-08-26 DIAGNOSIS — K2950 Unspecified chronic gastritis without bleeding: Secondary | ICD-10-CM | POA: Diagnosis not present

## 2017-08-26 MED ORDER — SODIUM CHLORIDE 0.9 % IV SOLN: 500.0000 mL | Freq: Once | INTRAVENOUS | Status: DC

## 2017-08-26 NOTE — Patient Instructions (Signed)
YOU HAD AN ENDOSCOPIC PROCEDURE TODAY AT [facility]:   Refer to the procedure report that was given to you for any specific questions about what was found during the examination.  If the procedure report does not answer your questions, please call your gastroenterologist to clarify.  If you requested that your care partner not be given the details of your procedure findings, then the procedure report has been included in a sealed envelope for you to review at your convenience later.  YOU SHOULD EXPECT: Some feelings of bloating in the abdomen. Passage of more gas than usual.  Walking can help get rid of the air that was put into your GI tract during the procedure and reduce the bloating. If you had a lower endoscopy (such as a colonoscopy or flexible sigmoidoscopy) you may notice spotting of blood in your stool or on the toilet paper. If you underwent a bowel prep for your procedure, you may not have a normal bowel movement for a few days.  Please Note:  You might notice some irritation and congestion in your nose or some drainage.  This is from the oxygen used during your procedure.  There is no need for concern and it should clear up in a day or so.  SYMPTOMS TO REPORT IMMEDIATELY:   Following lower endoscopy (colonoscopy or flexible sigmoidoscopy):  Excessive amounts of blood in the stool  Significant tenderness or worsening of abdominal pains  Swelling of the abdomen that is new, acute  Fever of 100F or higher   Following upper endoscopy (EGD)  Vomiting of blood or coffee ground material  New chest pain or pain under the shoulder blades  Painful or persistently difficult swallowing  New shortness of breath  Fever of 100F or higher  Black, tarry-looking stools  For urgent or emergent issues, a gastroenterologist can be reached at any hour by calling [phone].   DIET:  We do recommend a small meal at first, but then you may proceed to your regular diet.  Drink  plenty of fluids but you should avoid alcoholic beverages for 24 hours.  MEDICATIONS: Continue present medications. No High Dose Aspirin, Ibuprofen, Naproxen, or other non-steroidal anti-inflammatory drugs.  Please see handouts given to you by your recovery nurse.  Will consider EUS based on pathology results.  ACTIVITY:  You should plan to take it easy for the rest of today and you should NOT DRIVE or use heavy machinery until tomorrow (because of the sedation medicines used during the test).    FOLLOW UP: Our staff will call the number listed on your records the next business day following your procedure to check on you and address any questions or concerns that you may have regarding the information given to you following your procedure. If we do not reach you, we will leave a message.  However, if you are feeling well and you are not experiencing any problems, there is no need to return our call.  We will assume that you have returned to your regular daily activities without incident.  If any biopsies were taken you will be contacted by phone or by letter within the next 1-3 weeks.  Please call us at [phone] if you have not heard about the biopsies in 3 weeks.   Thank you for allowing [place] to provide for your healthcare needs today.   SIGNATURES/CONFIDENTIALITY: You and/or your care partner have signed paperwork which will be entered into your electronic medical record.  These signatures  attest to the fact that that the information above on your After Visit Summary has been reviewed and is understood.  Full responsibility of the confidentiality of this discharge information lies with you and/or your care-partner.

## 2017-08-26 NOTE — Progress Notes (Signed)
Called to room to assist during endoscopic procedure.  Patient ID and intended procedure confirmed with present staff. Received instructions for my participation in the procedure from the performing physician.  

## 2017-08-26 NOTE — Progress Notes (Signed)
Pt's states no medical or surgical changes since previsit or office visit. 

## 2017-08-26 NOTE — Op Note (Signed)
[facility] Patient Name: Consepcion [name] Procedure Date: 08/26/2017 8:04 AM MRN: [id number] Endoscopist: Mauri Pole , MD Age: 67 Referring MD:  Date of Birth: 06/15/1950 Gender: Female Account #: [id number] Procedure:                Upper GI endoscopy Indications:              Suspected upper gastrointestinal bleeding in                            patient with unexplained iron deficiency anemia Medicines:                Monitored Anesthesia Care Procedure:                Pre-Anesthesia Assessment:                           - Prior to the procedure, a History and Physical                            was performed, and patient medications and                            allergies were reviewed. The patient's tolerance of                            previous anesthesia was also reviewed. The risks                            and benefits of the procedure and the sedation                            options and risks were discussed with the patient.                            All questions were answered, and informed consent                            was obtained. Prior Anticoagulants: The patient has                            taken no previous anticoagulant or antiplatelet                            agents. ASA Grade Assessment: III - A patient with                            severe systemic disease. After reviewing the risks                            and benefits, the patient was deemed in                            satisfactory condition to undergo the procedure.  After obtaining informed consent, the endoscope was                            passed under direct vision. Throughout the                            procedure, the patient's blood pressure, pulse, and                            oxygen saturations were monitored continuously. The                            Endoscope was introduced through the mouth, and   advanced to the second part of duodenum. The upper                            GI endoscopy was accomplished without difficulty.                            The patient tolerated the procedure well. Scope In: Scope Out: Findings:                 The esophagus was normal.                           The Z-line was regular and was found 35 cm from the                            incisors.                           A small hiatal hernia was present.                           Patchy mild inflammation characterized by                            congestion (edema) and erythema was found in the                            entire examined stomach. Biopsies were taken with a                            cold forceps for Helicobacter pylori testing.                           A medium-sized, polypoid, non-circumferential mass                            with no bleeding and stigmata of recent bleeding                            with superficial erosions was found in the  prepyloric region of the stomach extending into the                            pylorus. Biopsies were taken with a cold forceps                            for histology.                           The examined duodenum was normal. Complications:            No immediate complications. Estimated Blood Loss:     Estimated blood loss was minimal. Impression:               - Normal esophagus.                           - Z-line regular, 35 cm from the incisors.                           - Small hiatal hernia.                           - Gastritis. Biopsied.                           - Rule out malignancy, gastric tumor in the                            prepyloric region of the stomach. Biopsied.                           - Normal examined duodenum. Recommendation:           - Resume previous diet.                           - Continue present medications.                           - Continue PPI                            - Await pathology results.                           - No high dose aspirin, ibuprofen, naproxen, or                            other non-steroidal anti-inflammatory drugs.                           - Will consider EUS based on pathology results Mauri Pole, MD 08/26/2017 8:50:04 AM This report has been signed electronically.

## 2017-08-26 NOTE — Op Note (Signed)
[facility] Patient Name: Morgan [name] Procedure Date: 08/26/2017 8:04 AM MRN: [id number] Endoscopist: Mauri Pole , MD Age: 67 Referring MD:  Date of Birth: 1950/09/17 Gender: Female Account #: [id number] Procedure:                Colonoscopy Indications:              Unexplained iron deficiency anemia Medicines:                Monitored Anesthesia Care Procedure:                Pre-Anesthesia Assessment:                           - Prior to the procedure, a History and Physical                            was performed, and patient medications and                            allergies were reviewed. The patient's tolerance of                            previous anesthesia was also reviewed. The risks                            and benefits of the procedure and the sedation                            options and risks were discussed with the patient.                            All questions were answered, and informed consent                            was obtained. Prior Anticoagulants: The patient has                            taken no previous anticoagulant or antiplatelet                            agents. ASA Grade Assessment: III - A patient with                            severe systemic disease. After reviewing the risks                            and benefits, the patient was deemed in                            satisfactory condition to undergo the procedure.                           After obtaining informed consent, the colonoscope  was passed under direct vision. Throughout the                            procedure, the patient's blood pressure, pulse, and                            oxygen saturations were monitored continuously. The                            Model PCF-[id number] scope was introduced                            through the anus and advanced to the the terminal                            ileum, with  identification of the appendiceal                            orifice and IC valve. The colonoscopy was performed                            without difficulty. The patient tolerated the                            procedure well. The quality of the bowel                            preparation was adequate. The terminal ileum,                            ileocecal valve, appendiceal orifice, and rectum                            were photographed. Scope In: 8:19:18 AM Scope Out: 8:41:04 AM Scope Withdrawal Time: 0 hours 16 minutes 42 seconds  Total Procedure Duration: 0 hours 21 minutes 46 seconds  Findings:                 The perianal and digital rectal examinations were                            normal.                           A 6 mm polyp was found in the ascending colon. The                            polyp was sessile. The polyp was removed with a                            cold snare. Resection and retrieval were complete.                           A 18 mm polyp was found in the ascending colon. The  polyp was granular lateral spreading. The polyp was                            removed with a piecemeal technique using a cold                            snare. Resection and retrieval were complete.                           Multiple small-mouthed diverticula were found in                            the sigmoid colon. Peri-diverticular erythema was                            seen. Biopsies were taken with a cold forceps for                            histology.                           Non-bleeding internal hemorrhoids were found during                            retroflexion. The hemorrhoids were small. Complications:            No immediate complications. Estimated Blood Loss:     Estimated blood loss was minimal. Impression:               - One 6 mm polyp in the ascending colon, removed                            with a cold snare. Resected and  retrieved.                           - One 18 mm polyp in the ascending colon, removed                            piecemeal using a cold snare. Resected and                            retrieved.                           - Moderate diverticulosis in the sigmoid colon.                            Peri-diverticular erythema was seen. Biopsied to                            exclude segmental colitis associated with                            diverticulosis.                           -  Non-bleeding internal hemorrhoids. Recommendation:           - Patient has a contact number available for                            emergencies. The signs and symptoms of potential                            delayed complications were discussed with the                            patient. Return to normal activities tomorrow.                            Written discharge instructions were provided to the                            patient.                           - Resume previous diet.                           - Continue present medications.                           - No aspirin, ibuprofen, naproxen, or other                            non-steroidal anti-inflammatory drugs.                           - Await pathology results.                           - Repeat colonoscopy in 1 year for surveillance                            after piecemeal polypectomy based on pathology                            report. Mauri Pole, MD 08/26/2017 8:54:39 AM This report has been signed electronically.

## 2017-08-26 NOTE — Progress Notes (Signed)
A and O x3. Report to RN. Tolerated MAC anesthesia well.Teeth unchanged after procedure.

## 2017-08-27 ENCOUNTER — Telehealth: Payer: Self-pay | Admitting: *Deleted

## 2017-08-27 ENCOUNTER — Telehealth: Payer: Self-pay

## 2017-08-27 NOTE — Telephone Encounter (Signed)
Left message on 2nd f/u call 

## 2017-08-27 NOTE — Telephone Encounter (Signed)
  Follow up Call-  Call back number 08/26/2017  Post procedure Call Back phone  # [phone]  Permission to leave phone message Yes  Some recent data might be hidden     Left message

## 2017-08-29 ENCOUNTER — Encounter: Payer: Self-pay | Admitting: Gastroenterology

## 2017-09-06 ENCOUNTER — Telehealth: Payer: Self-pay | Admitting: Gastroenterology

## 2017-09-06 NOTE — Telephone Encounter (Signed)
Explained to the patient her plan of care. She states she did not know she was to avoid NSAID or what they were.  Explained. Questions invited and answered. Agrees to this plan.

## 2017-09-06 NOTE — Telephone Encounter (Signed)
Patient states she is returning Beth's call about confirming appointments. Patient rescheduled EGD to 9.20.19 and confirmed pv on 9.4.19. Patient still requesting to speak with [facility] and states she will cb around 1PM.

## 2017-09-19 NOTE — Progress Notes (Signed)
Chief Complaint  Patient presents with  . Annual Exam    Pt feels that she may have a UTI from the [place].     HPI: Morgan [name] 67 y.o. comes in today for Preventive Medicare exam/ wellness visit . And Chronic disease management Since last visit. DM  Seems to be controlled 100 120 140 range  hld on med  Had colon and endo has hh and tics gi is better .  To see cards soon. Saw  Pod rx nail fungus with lamisil and better  Had foot exam  Hx utis  Not sure if could have one has cloudy urine   Had polyps removed GI and has HH  But go sx seem to be better   Going vacation to beach next week.  Health Maintenance  Topic Date Due  . FOOT EXAM  07/09/2017  . URINE MICROALBUMIN  07/09/2017  . PNA vac Low Risk Adult (2 of 2 - PPSV23) 07/09/2017  . HEMOGLOBIN A1C  08/26/2017  . INFLUENZA VACCINE  09/05/2017  . MAMMOGRAM  01/04/2018  . OPHTHALMOLOGY EXAM  08/21/2018  . COLONOSCOPY  08/27/2018  . TETANUS/TDAP  09/23/2024  . DEXA SCAN  Completed  . Hepatitis C Screening  Completed   Health Maintenance Review LIFESTYLE:  Exercise:   Tobacco/ETS:n Alcohol:  no Sugar beverages: no Sleep:  6-8 Drug use: no   HH:  40 hours    Week.  Alone and boarder that helps.    2 dogs    Hearing:  Some decrease.   Vision:  No limitations at present . Last eye check UTD  Glasses  Mild cataracts   Safety:  Has smoke detector and wears seat belts. . No excess sun exposure. Sees dentist regularly.  Other healthcare providers:  Reviewed today .  Preventive parameters: up-to-date  Reviewed  ADLS:   There are no problems or need for assistance  driving, feeding, obtaining food,   ROS:  Urine is cloudy cant tell if has uti or not  ocass throat sx  GEN/ HEENT: No fever, significant weight changes sweats headaches vision problems hearing changes, CV/ PULM; No chest pain shortness of breath cough, syncope,edema  change in exercise tolerance. GI /GU: No adominal pain, vomiting, change in bowel  habits. No blood in the stool. No significant GU symptoms. SKIN/HEME: ,no acute skin rashes suspicious lesions or bleeding. No lymphadenopathy, nodules, masses.  NEURO/ PSYCH:  No neurologic signs such as weakness numbness. No depression anxiety. IMM/ Allergy: No unusual infections.  Allergy .   REST of 12 system review negative except as per HPI   Past Medical History:  Diagnosis Date  . Anemia    as a child   . CAD, NATIVE VESSEL 05/13/2009  . DIABETES MELLITUS, TYPE II 12/09/2009  . Gastric ulcer   . Gastritis   . GERD (gastroesophageal reflux disease)   . Hx of abnormal cervical Pap smear    cryo  but normal since then   . HYPERLIPIDEMIA-MIXED 04/14/2008  . Intestinal metaplasia of gastric mucosa 2012  . Myocardial infarction ([place])    2003  . Obesity   . Occult blood in stools   . Osteopenia   . OVERWEIGHT/OBESITY 05/13/2009  . Ulcer    Duodenal ulcer by x-ray 40 years ago  . Varicose veins    under rx [facility] 2016    Family History  Problem Relation Age of Onset  . Emphysema Mother        Smoker  .  Heart attack Mother        due to head injury  . Alcohol abuse Mother   . Heart disease Mother   . Early death Mother   . Varicose Veins Mother   . AAA (abdominal aortic aneurysm) Mother   . Lung cancer Father   . Heart attack Father   . Hypertension Father   . Alcohol abuse Father   . Heart disease Father        before age 58  . Aneurysm Father   . Hyperlipidemia Father   . Alcohol abuse Brother   . Diabetes Brother   . Heart disease Brother   . Heart attack Brother   . Diabetes Brother   . Aneurysm Paternal Grandmother   . Diabetes Unknown        grandfather  . Sudden Cardiac Death Brother   . Colon cancer Neg Hx   . Esophageal cancer Neg Hx   . Stomach cancer Neg Hx     Social History   Socioeconomic History  . Marital status: Widowed    Spouse name: Not on file  . Number of children: 2  . Years of education: phd   . Highest education level: Not on  file  Occupational History  . Occupation: Mental Health Counselor    Comment: [employer]  . Occupation: Nurse, adult: [employer]  . Financial resource strain: Not on file  . Food insecurity:    Worry: Not on file    Inability: Not on file  . Transportation needs:    Medical: Not on file    Non-medical: Not on file  Tobacco Use  . Smoking status: Former Smoker    Last attempt to quit: 02/05/1994    Years since quitting: 23.6  . Smokeless tobacco: Never Used  Substance and Sexual Activity  . Alcohol use: No    Alcohol/week: 0.0 standard drinks  . Drug use: No  . Sexual activity: Not on file  Lifestyle  . Physical activity:    Days per week: Not on file    Minutes per session: Not on file  . Stress: Not on file  Relationships  . Social connections:    Talks on phone: Not on file    Gets together: Not on file    Attends religious service: Not on file    Active member of club or organization: Not on file    Attends meetings of clubs or organizations: Not on file    Relationship status: Not on file  Other Topics Concern  . 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    No firearms    NO reg exercise    Pets- Poodles 2    Sleep 7 hours     Outpatient Encounter Medications as of 09/20/2017  Medication Sig  . aspirin 81 MG tablet Take 81 mg by mouth daily.    Marland Kitchen atorvastatin (LIPITOR) 80 MG tablet TAKE 1 TABLET BY MOUTH EVERY DAY AT 6 PM  . clindamycin (CLEOCIN) 150 MG capsule Take by mouth. 4 tabs prior to dental work   . Cyanocobalamin (B-12 PO) Take by  mouth.  . ezetimibe (ZETIA) 10 MG tablet TAKE 1 TABLET BY MOUTH EVERY DAY  . Ferrous Sulfate (IRON) 325 (65 Fe) MG TABS Take 1 tablet by mouth daily.  Marland Kitchen glucose blood (ONE  TOUCH ULTRA TEST) test strip One Touch Ultra:  Use as instructed  . Insulin Pen Needle (B-D UF III MINI PEN NEEDLES) 31G X 5 MM MISC USE AS DIRECTED.  Marland Kitchen INVOKANA 300 MG TABS tablet TAKE 1 TABLET(300 MG) BY MOUTH DAILY BEFORE BREAKFAST  . liraglutide (VICTOZA) 18 MG/3ML SOPN INJECT 1.8 MGUNDER THE SKIN DAILY  . metFORMIN (GLUCOPHAGE-XR) 500 MG 24 hr tablet TAKE 2 TABLETS BY MOUTH TWICE DAILY WITH MEALS  . Multiple Vitamins-Minerals (MULTIVITAMIN PO) Take by mouth daily.  Marland Kitchen omeprazole (PRILOSEC) 20 MG capsule TAKE 1 CAPSULE BY MOUTH EVERY DAY  . ONETOUCH DELICA LANCETS FINE MISC USE AS DIRECTED  . [DISCONTINUED] ezetimibe (ZETIA) 10 MG tablet TAKE 1 TABLET BY MOUTH DAILY (Patient not taking: Reported on 09/20/2017)  . [DISCONTINUED] liraglutide (VICTOZA) 18 MG/3ML SOPN INJECT 1.8 MGUNDER THE SKIN DAILY (Patient not taking: Reported on 09/20/2017)  . [DISCONTINUED] terbinafine (LAMISIL) 250 MG tablet Take 1 tablet (250 mg total) by mouth daily. (Patient not taking: Reported on 09/20/2017)   Facility-Administered Encounter Medications as of 09/20/2017  Medication  . 0.9 %  sodium chloride infusion    EXAM:  BP 102/60 (BP Location: Right Arm, Patient Position: Sitting, Cuff Size: Normal)   Pulse (!) 58   Temp 97.8 F (36.6 C) (Oral)   Ht _0  (1.651 m)   Wt 161 lb 6.4 oz (73.2 kg)   BMI 26.86 kg/m   Body mass index is 26.86 kg/m.  Physical Exam: Vital signs reviewed GXQ:JJHE is a well-developed well-nourished alert cooperative   who appears stated age in no acute distress.  HEENT: normocephalic atraumatic , Eyes: PERRL EOM's full, conjunctiva clear, Nares: paten,t no deformity discharge or tenderness., Ears: no deformity EAC's clear TMs with normal landmarks. Mouth: clear OP, no lesions, edema.  Moist mucous membranes. Dentition in adequate repair. NECK: supple without masses, thyromegaly or bruits. CHEST/PULM:  Clear to auscultation and percussion breath sounds equal no wheeze , rales or  rhonchi. No chest wall deformities or tenderness. Breast: normal by inspection . No dimpling, discharge, masses, tenderness or discharge . CV: PMI is nondisplaced, S1 S2 no gallops, murmurs, rubs. Peripheral pulses are full without delay.No JVD .  ABDOMEN: Bowel sounds normal nontender  No guard or rebound, no hepato splenomegal no CVA tenderness.   Extremtities:  No clubbing cyanosis or edema, no acute joint swelling or redness no focal atrophy NEURO:  Oriented x3, cranial nerves 3-12 appear to be intact, no obvious focal weakness,gait within normal limits no abnormal reflexes or asymmetrical SKIN: No acute rashes normal turgor, color, no bruising or petechiae. PSYCH: Oriented, good eye contact, no obvious depression anxiety, cognition and judgment appear normal. LN: no cervical axillary inguinal adenopathy No noted deficits in memory, attention, and speech. Diabetic Foot Exam - Simple   Simple Foot Form Diabetic Foot exam was performed with the following findings:  Yes 09/20/2017  9:14 AM  Visual Inspection No deformities, no ulcerations, no other skin breakdown bilaterally:  Yes Sensation Testing Intact to touch and monofilament testing bilaterally:  Yes Pulse Check Posterior Tibialis and Dorsalis pulse intact bilaterally:  Yes Comments fe callopus on left foot no ulcer or lesion    Local numb at callous left great toe   Wt Readings from Last 3 Encounters:  09/20/17 161 lb 6.4 oz (73.2 kg)  08/26/17 167 lb (75.8 kg)  05/31/17 167 lb 2 oz (75.8 kg)   ASSESSMENT AND PLAN:  Discussed the following assessment and plan:  Visit for preventive health examination  Medication management - Plan: Basic metabolic panel, CBC with Differential/Platelet, Hemoglobin A1c, Hepatic function panel, Lipid panel, TSH, Microalbumin / creatinine urine ratio  Hyperlipidemia, unspecified hyperlipidemia type - Plan: Basic metabolic panel, CBC with Differential/Platelet, Hemoglobin A1c, Hepatic function  panel, Lipid panel, TSH, Microalbumin / creatinine urine ratio  Diabetes mellitus without complication ([place]) - Plan: Basic metabolic panel, CBC with Differential/Platelet, Hemoglobin A1c, Hepatic function panel, Lipid panel, TSH, Microalbumin / creatinine urine ratio  Atherosclerosis of native coronary artery of native heart without angina pectoris - Plan: Basic metabolic panel, CBC with Differential/Platelet, Hemoglobin A1c, Hepatic function panel, Lipid panel, TSH, Microalbumin / creatinine urine ratio  GERD without esophagitis - Plan: Basic metabolic panel, CBC with Differential/Platelet, Hemoglobin A1c, Hepatic function panel, Lipid panel, TSH, Microalbumin / creatinine urine ratio  Urinary tract infection without hematuria, site unspecified  ?  - r/o - Plan: Basic metabolic panel, CBC with Differential/Platelet, Hemoglobin A1c, Hepatic function panel, Lipid panel, TSH, Microalbumin / creatinine urine ratio, POC Urinalysis Dipstick, Urine Culture [place] [facility] [facility]   Disease states stable  utd on PV   Patient Care Team: Kalai Baca, Standley Brooking, MD as PCP - General (Internal Medicine) Martinique, Amy, MD as Consulting Physician (Dermatology) Dian Queen, MD as Attending Physician (Obstetrics and Gynecology) Latanya Maudlin, MD as Consulting Physician (Orthopedic Surgery) Lab Results  Component Value Date   WBC 7.5 09/20/2017   HGB 15.1 (H) 09/20/2017   HCT 44.3 09/20/2017   PLT 206.0 09/20/2017   GLUCOSE 157 (H) 09/20/2017   CHOL 136 09/20/2017   TRIG 71.0 09/20/2017   HDL 43.00 09/20/2017   LDLCALC 79 09/20/2017   ALT 24 09/20/2017   AST 17 09/20/2017   NA 137 09/20/2017   K 4.7 09/20/2017   CL 99 09/20/2017   CREATININE 0.75 09/20/2017   BUN 8 09/20/2017   CO2 30 09/20/2017   TSH 2.84 09/20/2017   HGBA1C 7.3 (H) 09/20/2017   MICROALBUR 3.9 (H) 09/20/2017    Patient Instructions  Glad you are doing better  Will  let you know when culture back .  Will notify you  of labs when available.   If all ok then ROV in 6 months or as needed.    Health Maintenance, Female Adopting a healthy lifestyle and getting preventive care can go a long way to promote health and wellness. Talk with your health care provider about what schedule of regular examinations is right for you. This is a good chance for you to check in with your provider about disease prevention and staying healthy. In between checkups, there are plenty of things you can do on your own. Experts have done a lot of research about which lifestyle changes and preventive measures are most likely to keep you healthy. Ask your health care provider for more information. Weight and diet Eat a healthy diet  Be sure to include plenty of vegetables, fruits, low-fat dairy products, and lean protein.  Do not eat a lot of foods high in solid fats, added sugars, or salt.  Get regular exercise. This is one of the most important things you can do for your health. ? Most adults should exercise for at least 150 minutes each week.  The exercise should increase your heart rate and make you sweat (moderate-intensity exercise). ? Most adults should also do strengthening exercises at least twice a week. This is in addition to the moderate-intensity exercise.  Maintain a healthy weight  Body mass index (BMI) is a measurement that can be used to identify possible weight problems. It estimates body fat based on height and weight. Your health care provider can help determine your BMI and help you achieve or maintain a healthy weight.  For females 33 years of age and older: ? A BMI below 18.5 is considered underweight. ? A BMI of 18.5 to 24.9 is normal. ? A BMI of 25 to 29.9 is considered overweight. ? A BMI of 30 and above is considered obese.  Watch levels of cholesterol and blood lipids  You should start having your blood tested for lipids and cholesterol at 66 years  of age, then have this test every 5 years.  You may need to have your cholesterol levels checked more often if: ? Your lipid or cholesterol levels are high. ? You are older than 66 years of age. ? You are at high risk for heart disease.  Cancer screening Lung Cancer  Lung cancer screening is recommended for adults 86-58 years old who are at high risk for lung cancer because of a history of smoking.  A yearly low-dose CT scan of the lungs is recommended for people who: ? Currently smoke. ? Have quit within the past 15 years. ? Have at least a 30-pack-year history of smoking. A pack year is smoking an average of one pack of cigarettes a day for 1 year.  Yearly screening should continue until it has been 15 years since you quit.  Yearly screening should stop if you develop a health problem that would prevent you from having lung cancer treatment.  Breast Cancer  Practice breast self-awareness. This means understanding how your breasts normally appear and feel.  It also means doing regular breast self-exams. Let your health care provider know about any changes, no matter how small.  If you are in your 20s or 30s, you should have a clinical breast exam (CBE) by a health care provider every 1-3 years as part of a regular health exam.  If you are 73 or older, have a CBE every year. Also consider having a breast X-ray (mammogram) every year.  If you have a family history of breast cancer, talk to your health care provider about genetic screening.  If you are at high risk for breast cancer, talk to your health care provider about having an MRI and a mammogram every year.  Breast cancer gene (BRCA) assessment is recommended for women who have family members with BRCA-related cancers. BRCA-related cancers include: ? Breast. ? Ovarian. ? Tubal. ? Peritoneal cancers.  Results of the assessment will determine the need for genetic counseling and BRCA1 and BRCA2 testing.  Cervical  Cancer Your health care provider may recommend that you be screened regularly for cancer of the pelvic organs (ovaries, uterus, and vagina). This screening involves a pelvic examination, including checking for microscopic changes to the surface of your cervix (Pap test). You may be encouraged to have this screening done every 3 years, beginning at age 55.  For women ages 12-65, health care providers may recommend pelvic exams and Pap testing every 3 years, or they may recommend the Pap and pelvic exam, combined with testing for human papilloma virus (HPV), every 5 years. Some types of HPV increase  your risk of cervical cancer. Testing for HPV may also be done on women of any age with unclear Pap test results.  Other health care providers may not recommend any screening for nonpregnant women who are considered low risk for pelvic cancer and who do not have symptoms. Ask your health care provider if a screening pelvic exam is right for you.  If you have had past treatment for cervical cancer or a condition that could lead to cancer, you need Pap tests and screening for cancer for at least 20 years after your treatment. If Pap tests have been discontinued, your risk factors (such as having a new sexual partner) need to be reassessed to determine if screening should resume. Some women have medical problems that increase the chance of getting cervical cancer. In these cases, your health care provider may recommend more frequent screening and Pap tests.  Colorectal Cancer  This type of cancer can be detected and often prevented.  Routine colorectal cancer screening usually begins at 66 years of age and continues through 66 years of age.  Your health care provider may recommend screening at an earlier age if you have risk factors for colon cancer.  Your health care provider may also recommend using home test kits to check for hidden blood in the stool.  A small camera at the end of a tube can be used to  examine your colon directly (sigmoidoscopy or colonoscopy). This is done to check for the earliest forms of colorectal cancer.  Routine screening usually begins at age 40.  Direct examination of the colon should be repeated every 5-10 years through 66 years of age. However, you may need to be screened more often if early forms of precancerous polyps or small growths are found.  Skin Cancer  Check your skin from head to toe regularly.  Tell your health care provider about any new moles or changes in moles, especially if there is a change in a mole's shape or color.  Also tell your health care provider if you have a mole that is larger than the size of a pencil eraser.  Always use sunscreen. Apply sunscreen liberally and repeatedly throughout the day.  Protect yourself by wearing long sleeves, pants, a wide-brimmed hat, and sunglasses whenever you are outside.  Heart disease, diabetes, and high blood pressure  High blood pressure causes heart disease and increases the risk of stroke. High blood pressure is more likely to develop in: ? People who have blood pressure in the high end of the normal range (130-139/85-89 mm Hg). ? People who are overweight or obese. ? People who are African American.  If you are 32-60 years of age, have your blood pressure checked every 3-5 years. If you are 69 years of age or older, have your blood pressure checked every year. You should have your blood pressure measured twice-once when you are at a hospital or clinic, and once when you are not at a hospital or clinic. Record the average of the two measurements. To check your blood pressure when you are not at a hospital or clinic, you can use: ? An automated blood pressure machine at a pharmacy. ? A home blood pressure monitor.  If you are between 15 years and 65 years old, ask your health care provider if you should take aspirin to prevent strokes.  Have regular diabetes screenings. This involves taking a  blood sample to check your fasting blood sugar level. ? If you are at a normal  weight and have a low risk for diabetes, have this test once every three years after 66 years of age. ? If you are overweight and have a high risk for diabetes, consider being tested at a younger age or more often. Preventing infection Hepatitis B  If you have a higher risk for hepatitis B, you should be screened for this virus. You are considered at high risk for hepatitis B if: ? You were born in a country where hepatitis B is common. Ask your health care provider which countries are considered high risk. ? Your parents were born in a high-risk country, and you have not been immunized against hepatitis B (hepatitis B vaccine). ? You have HIV or AIDS. ? You use needles to inject street drugs. ? You live with someone who has hepatitis B. ? You have had sex with someone who has hepatitis B. ? You get hemodialysis treatment. ? You take certain medicines for conditions, including cancer, organ transplantation, and autoimmune conditions.  Hepatitis C  Blood testing is recommended for: ? Everyone born from [birth year] through 1964. ? Anyone with known risk factors for hepatitis C.  Sexually transmitted infections (STIs)  You should be screened for sexually transmitted infections (STIs) including gonorrhea and chlamydia if: ? You are sexually active and are younger than 66 years of age. ? You are older than 66 years of age and your health care provider tells you that you are at risk for this type of infection. ? Your sexual activity has changed since you were last screened and you are at an increased risk for chlamydia or gonorrhea. Ask your health care provider if you are at risk.  If you do not have HIV, but are at risk, it may be recommended that you take a prescription medicine daily to prevent HIV infection. This is called pre-exposure prophylaxis (PrEP). You are considered at risk if: ? You are sexually active and  do not regularly use condoms or know the HIV status of your partner(s). ? You take drugs by injection. ? You are sexually active with a partner who has HIV.  Talk with your health care provider about whether you are at high risk of being infected with HIV. If you choose to begin PrEP, you should first be tested for HIV. You should then be tested every 3 months for as long as you are taking PrEP. Pregnancy  If you are premenopausal and you may become pregnant, ask your health care provider about preconception counseling.  If you may become pregnant, take 400 to 800 micrograms (mcg) of folic acid every day.  If you want to prevent pregnancy, talk to your health care provider about birth control (contraception). Osteoporosis and menopause  Osteoporosis is a disease in which the bones lose minerals and strength with aging. This can result in serious bone fractures. Your risk for osteoporosis can be identified using a bone density scan.  If you are 19 years of age or older, or if you are at risk for osteoporosis and fractures, ask your health care provider if you should be screened.  Ask your health care provider whether you should take a calcium or vitamin D supplement to lower your risk for osteoporosis.  Menopause may have certain physical symptoms and risks.  Hormone replacement therapy may reduce some of these symptoms and risks. Talk to your health care provider about whether hormone replacement therapy is right for you. Follow these instructions at home:  Schedule regular health, dental, and eye exams.  Stay current with your immunizations.  Do not use any tobacco products including cigarettes, chewing tobacco, or electronic cigarettes.  If you are pregnant, do not drink alcohol.  If you are breastfeeding, limit how much and how often you drink alcohol.  Limit alcohol intake to no more than 1 drink per day for nonpregnant women. One drink equals 12 ounces of beer, 5 ounces of  wine, or 1 ounces of hard liquor.  Do not use street drugs.  Do not share needles.  Ask your health care provider for help if you need support or information about quitting drugs.  Tell your health care provider if you often feel depressed.  Tell your health care provider if you have ever been abused or do not feel safe at home. This information is not intended to replace advice given to you by your health care provider. Make sure you discuss any questions you have with your health care provider. Document Released: 08/07/2010 Document Revised: 06/30/2015 Document Reviewed: 10/26/2014 Elsevier Interactive Patient Education  2018 [place]. Delson Dulworth M.D.

## 2017-09-20 ENCOUNTER — Ambulatory Visit (INDEPENDENT_AMBULATORY_CARE_PROVIDER_SITE_OTHER): Payer: PPO | Admitting: Internal Medicine

## 2017-09-20 ENCOUNTER — Encounter: Payer: Self-pay | Admitting: Internal Medicine

## 2017-09-20 VITALS — BP 102/60 | HR 58 | Temp 97.8°F | Ht 65.0 in | Wt 161.4 lb

## 2017-09-20 DIAGNOSIS — E119 Type 2 diabetes mellitus without complications: Secondary | ICD-10-CM | POA: Diagnosis not present

## 2017-09-20 DIAGNOSIS — K219 Gastro-esophageal reflux disease without esophagitis: Secondary | ICD-10-CM | POA: Diagnosis not present

## 2017-09-20 DIAGNOSIS — N390 Urinary tract infection, site not specified: Secondary | ICD-10-CM

## 2017-09-20 DIAGNOSIS — Z0000 Encounter for general adult medical examination without abnormal findings: Secondary | ICD-10-CM

## 2017-09-20 DIAGNOSIS — I2510 Atherosclerotic heart disease of native coronary artery without angina pectoris: Secondary | ICD-10-CM

## 2017-09-20 DIAGNOSIS — E785 Hyperlipidemia, unspecified: Secondary | ICD-10-CM

## 2017-09-20 DIAGNOSIS — Z79899 Other long term (current) drug therapy: Secondary | ICD-10-CM | POA: Diagnosis not present

## 2017-09-20 LAB — BASIC METABOLIC PANEL
BUN: 8 mg/dL (ref 6–23)
CALCIUM: 9.9 mg/dL (ref 8.4–10.5)
CO2: 30 meq/L (ref 19–32)
Chloride: 99 mEq/L (ref 96–112)
Creatinine, Ser: 0.75 mg/dL (ref 0.40–1.20)
GFR: 82.06 mL/min (ref >60.00)
GLUCOSE: 157 mg/dL — AB (ref 70–99)
POTASSIUM: 4.7 meq/L (ref 3.5–5.1)
Sodium: 137 mEq/L (ref 135–145)

## 2017-09-20 LAB — TSH: TSH: 2.84 u[IU]/mL (ref 0.35–4.50)

## 2017-09-20 LAB — HEMOGLOBIN A1C: HEMOGLOBIN A1C: 7.3 % — AB (ref 4.6–6.5)

## 2017-09-20 LAB — CBC WITH DIFFERENTIAL/PLATELET
Basophils Absolute: 0 10*3/uL (ref 0.0–0.1)
Basophils Relative: 0.4 % (ref 0.0–3.0)
EOS PCT: 0.6 % (ref 0.0–5.0)
Eosinophils Absolute: 0 10*3/uL (ref 0.0–0.7)
HCT: 44.3 % (ref 36.0–46.0)
Hemoglobin: 15.1 g/dL — ABNORMAL HIGH (ref 12.0–15.0)
LYMPHS ABS: 1.4 10*3/uL (ref 0.7–4.0)
Lymphocytes Relative: 19.3 % (ref 12.0–46.0)
MCHC: 34.2 g/dL (ref 30.0–36.0)
MCV: 92.8 fl (ref 78.0–100.0)
MONO ABS: 0.4 10*3/uL (ref 0.1–1.0)
Monocytes Relative: 5.5 % (ref 3.0–12.0)
NEUTROS ABS: 5.5 10*3/uL (ref 1.4–7.7)
NEUTROS PCT: 74.2 % (ref 43.0–77.0)
PLATELETS: 206 10*3/uL (ref 150.0–400.0)
RBC: 4.77 Mil/uL (ref 3.87–5.11)
RDW: 13.5 % (ref 11.5–15.5)
WBC: 7.5 10*3/uL (ref 4.0–10.5)

## 2017-09-20 LAB — LIPID PANEL
CHOLESTEROL: 136 mg/dL (ref 0–200)
HDL: 43 mg/dL (ref >39.00)
LDL Cholesterol: 79 mg/dL (ref 0–99)
NonHDL: 92.77
TRIGLYCERIDES: 71 mg/dL (ref 0.0–149.0)
Total CHOL/HDL Ratio: 3
VLDL: 14.2 mg/dL (ref 0.0–40.0)

## 2017-09-20 LAB — HEPATIC FUNCTION PANEL
ALK PHOS: 64 U/L (ref 39–117)
ALT: 24 U/L (ref 0–35)
AST: 17 U/L (ref 0–37)
Albumin: 4.6 g/dL (ref 3.5–5.2)
Bilirubin, Direct: 0.2 mg/dL (ref 0.0–0.3)
TOTAL PROTEIN: 6.6 g/dL (ref 6.0–8.3)
Total Bilirubin: 0.8 mg/dL (ref 0.2–1.2)

## 2017-09-20 LAB — MICROALBUMIN / CREATININE URINE RATIO
CREATININE, U: 57.1 mg/dL
Microalb Creat Ratio: 6.9 mg/g (ref 0.0–30.0)
Microalb, Ur: 3.9 mg/dL — ABNORMAL HIGH (ref 0.0–1.9)

## 2017-09-20 LAB — POCT URINALYSIS DIPSTICK
Bilirubin, UA: NEGATIVE
Glucose, UA: POSITIVE — AB
KETONES UA: NEGATIVE
NITRITE UA: NEGATIVE
PROTEIN UA: POSITIVE — AB
SPEC GRAV UA: 1.015 (ref 1.010–1.025)
UROBILINOGEN UA: 0.2 U/dL
pH, UA: 6 (ref 5.0–8.0)

## 2017-09-20 NOTE — Patient Instructions (Signed)
Glad you are doing better  Will let you know when culture back .  Will notify you  of labs when available.   If all ok then ROV in 6 months or as needed.    Health Maintenance, Female Adopting a healthy lifestyle and getting preventive care can go a long way to promote health and wellness. Talk with your health care provider about what schedule of regular examinations is right for you. This is a good chance for you to check in with your provider about disease prevention and staying healthy. In between checkups, there are plenty of things you can do on your own. Experts have done a lot of research about which lifestyle changes and preventive measures are most likely to keep you healthy. Ask your health care provider for more information. Weight and diet Eat a healthy diet  Be sure to include plenty of vegetables, fruits, low-fat dairy products, and lean protein.  Do not eat a lot of foods high in solid fats, added sugars, or salt.  Get regular exercise. This is one of the most important things you can do for your health. ? Most adults should exercise for at least 150 minutes each week. The exercise should increase your heart rate and make you sweat (moderate-intensity exercise). ? Most adults should also do strengthening exercises at least twice a week. This is in addition to the moderate-intensity exercise.  Maintain a healthy weight  Body mass index (BMI) is a measurement that can be used to identify possible weight problems. It estimates body fat based on height and weight. Your health care provider can help determine your BMI and help you achieve or maintain a healthy weight.  For females 23 years of age and older: ? A BMI below 18.5 is considered underweight. ? A BMI of 18.5 to 24.9 is normal. ? A BMI of 25 to 29.9 is considered overweight. ? A BMI of 30 and above is considered obese.  Watch levels of cholesterol and blood lipids  You should start having your blood tested for  lipids and cholesterol at 66 years of age, then have this test every 5 years.  You may need to have your cholesterol levels checked more often if: ? Your lipid or cholesterol levels are high. ? You are older than 66 years of age. ? You are at high risk for heart disease.  Cancer screening Lung Cancer  Lung cancer screening is recommended for adults 24-33 years old who are at high risk for lung cancer because of a history of smoking.  A yearly low-dose CT scan of the lungs is recommended for people who: ? Currently smoke. ? Have quit within the past 15 years. ? Have at least a 30-pack-year history of smoking. A pack year is smoking an average of one pack of cigarettes a day for 1 year.  Yearly screening should continue until it has been 15 years since you quit.  Yearly screening should stop if you develop a health problem that would prevent you from having lung cancer treatment.  Breast Cancer  Practice breast self-awareness. This means understanding how your breasts normally appear and feel.  It also means doing regular breast self-exams. Let your health care provider know about any changes, no matter how small.  If you are in your 20s or 30s, you should have a clinical breast exam (CBE) by a health care provider every 1-3 years as part of a regular health exam.  If you are 40 or older, have  a CBE every year. Also consider having a breast X-ray (mammogram) every year.  If you have a family history of breast cancer, talk to your health care provider about genetic screening.  If you are at high risk for breast cancer, talk to your health care provider about having an MRI and a mammogram every year.  Breast cancer gene (BRCA) assessment is recommended for women who have family members with BRCA-related cancers. BRCA-related cancers include: ? Breast. ? Ovarian. ? Tubal. ? Peritoneal cancers.  Results of the assessment will determine the need for genetic counseling and BRCA1 and  BRCA2 testing.  Cervical Cancer Your health care provider may recommend that you be screened regularly for cancer of the pelvic organs (ovaries, uterus, and vagina). This screening involves a pelvic examination, including checking for microscopic changes to the surface of your cervix (Pap test). You may be encouraged to have this screening done every 3 years, beginning at age 73.  For women ages 68-65, health care providers may recommend pelvic exams and Pap testing every 3 years, or they may recommend the Pap and pelvic exam, combined with testing for human papilloma virus (HPV), every 5 years. Some types of HPV increase your risk of cervical cancer. Testing for HPV may also be done on women of any age with unclear Pap test results.  Other health care providers may not recommend any screening for nonpregnant women who are considered low risk for pelvic cancer and who do not have symptoms. Ask your health care provider if a screening pelvic exam is right for you.  If you have had past treatment for cervical cancer or a condition that could lead to cancer, you need Pap tests and screening for cancer for at least 20 years after your treatment. If Pap tests have been discontinued, your risk factors (such as having a new sexual partner) need to be reassessed to determine if screening should resume. Some women have medical problems that increase the chance of getting cervical cancer. In these cases, your health care provider may recommend more frequent screening and Pap tests.  Colorectal Cancer  This type of cancer can be detected and often prevented.  Routine colorectal cancer screening usually begins at 66 years of age and continues through 66 years of age.  Your health care provider may recommend screening at an earlier age if you have risk factors for colon cancer.  Your health care provider may also recommend using home test kits to check for hidden blood in the stool.  A small camera at the  end of a tube can be used to examine your colon directly (sigmoidoscopy or colonoscopy). This is done to check for the earliest forms of colorectal cancer.  Routine screening usually begins at age 51.  Direct examination of the colon should be repeated every 5-10 years through 66 years of age. However, you may need to be screened more often if early forms of precancerous polyps or small growths are found.  Skin Cancer  Check your skin from head to toe regularly.  Tell your health care provider about any new moles or changes in moles, especially if there is a change in a mole's shape or color.  Also tell your health care provider if you have a mole that is larger than the size of a pencil eraser.  Always use sunscreen. Apply sunscreen liberally and repeatedly throughout the day.  Protect yourself by wearing long sleeves, pants, a wide-brimmed hat, and sunglasses whenever you are outside.  Heart  disease, diabetes, and high blood pressure  High blood pressure causes heart disease and increases the risk of stroke. High blood pressure is more likely to develop in: ? People who have blood pressure in the high end of the normal range (130-139/85-89 mm Hg). ? People who are overweight or obese. ? People who are African American.  If you are 1-29 years of age, have your blood pressure checked every 3-5 years. If you are 81 years of age or older, have your blood pressure checked every year. You should have your blood pressure measured twice-once when you are at a hospital or clinic, and once when you are not at a hospital or clinic. Record the average of the two measurements. To check your blood pressure when you are not at a hospital or clinic, you can use: ? An automated blood pressure machine at a pharmacy. ? A home blood pressure monitor.  If you are between 41 years and 35 years old, ask your health care provider if you should take aspirin to prevent strokes.  Have regular diabetes  screenings. This involves taking a blood sample to check your fasting blood sugar level. ? If you are at a normal weight and have a low risk for diabetes, have this test once every three years after 66 years of age. ? If you are overweight and have a high risk for diabetes, consider being tested at a younger age or more often. Preventing infection Hepatitis B  If you have a higher risk for hepatitis B, you should be screened for this virus. You are considered at high risk for hepatitis B if: ? You were born in a country where hepatitis B is common. Ask your health care provider which countries are considered high risk. ? Your parents were born in a high-risk country, and you have not been immunized against hepatitis B (hepatitis B vaccine). ? You have HIV or AIDS. ? You use needles to inject street drugs. ? You live with someone who has hepatitis B. ? You have had sex with someone who has hepatitis B. ? You get hemodialysis treatment. ? You take certain medicines for conditions, including cancer, organ transplantation, and autoimmune conditions.  Hepatitis C  Blood testing is recommended for: ? Everyone born from [birth year] through 1964. ? Anyone with known risk factors for hepatitis C.  Sexually transmitted infections (STIs)  You should be screened for sexually transmitted infections (STIs) including gonorrhea and chlamydia if: ? You are sexually active and are younger than 66 years of age. ? You are older than 66 years of age and your health care provider tells you that you are at risk for this type of infection. ? Your sexual activity has changed since you were last screened and you are at an increased risk for chlamydia or gonorrhea. Ask your health care provider if you are at risk.  If you do not have HIV, but are at risk, it may be recommended that you take a prescription medicine daily to prevent HIV infection. This is called pre-exposure prophylaxis (PrEP). You are considered at risk  if: ? You are sexually active and do not regularly use condoms or know the HIV status of your partner(s). ? You take drugs by injection. ? You are sexually active with a partner who has HIV.  Talk with your health care provider about whether you are at high risk of being infected with HIV. If you choose to begin PrEP, you should first be tested for HIV. You  should then be tested every 3 months for as long as you are taking PrEP. Pregnancy  If you are premenopausal and you may become pregnant, ask your health care provider about preconception counseling.  If you may become pregnant, take 400 to 800 micrograms (mcg) of folic acid every day.  If you want to prevent pregnancy, talk to your health care provider about birth control (contraception). Osteoporosis and menopause  Osteoporosis is a disease in which the bones lose minerals and strength with aging. This can result in serious bone fractures. Your risk for osteoporosis can be identified using a bone density scan.  If you are 74 years of age or older, or if you are at risk for osteoporosis and fractures, ask your health care provider if you should be screened.  Ask your health care provider whether you should take a calcium or vitamin D supplement to lower your risk for osteoporosis.  Menopause may have certain physical symptoms and risks.  Hormone replacement therapy may reduce some of these symptoms and risks. Talk to your health care provider about whether hormone replacement therapy is right for you. Follow these instructions at home:  Schedule regular health, dental, and eye exams.  Stay current with your immunizations.  Do not use any tobacco products including cigarettes, chewing tobacco, or electronic cigarettes.  If you are pregnant, do not drink alcohol.  If you are breastfeeding, limit how much and how often you drink alcohol.  Limit alcohol intake to no more than 1 drink per day for nonpregnant women. One drink  equals 12 ounces of beer, 5 ounces of wine, or 1 ounces of hard liquor.  Do not use street drugs.  Do not share needles.  Ask your health care provider for help if you need support or information about quitting drugs.  Tell your health care provider if you often feel depressed.  Tell your health care provider if you have ever been abused or do not feel safe at home. This information is not intended to replace advice given to you by your health care provider. Make sure you discuss any questions you have with your health care provider. Document Released: 08/07/2010 Document Revised: 06/30/2015 Document Reviewed: 10/26/2014 Elsevier Interactive Patient Education  [employer].

## 2017-09-23 LAB — URINE CULTURE
MICRO NUMBER: 90977225
SPECIMEN QUALITY: ADEQUATE

## 2017-09-24 MED ORDER — NITROFURANTOIN MONOHYD MACRO 100 MG PO CAPS: 100.0000 mg | ORAL_CAPSULE | Freq: Two times a day (BID) | ORAL | 0 refills | Status: DC

## 2017-09-29 ENCOUNTER — Other Ambulatory Visit: Payer: Self-pay | Admitting: Internal Medicine

## 2017-10-09 ENCOUNTER — Ambulatory Visit (AMBULATORY_SURGERY_CENTER): Payer: Self-pay | Admitting: *Deleted

## 2017-10-09 VITALS — Ht 65.0 in | Wt 165.0 lb

## 2017-10-09 DIAGNOSIS — K319 Disease of stomach and duodenum, unspecified: Secondary | ICD-10-CM

## 2017-10-09 DIAGNOSIS — K2930 Chronic superficial gastritis without bleeding: Secondary | ICD-10-CM

## 2017-10-09 DIAGNOSIS — K3189 Other diseases of stomach and duodenum: Secondary | ICD-10-CM

## 2017-10-09 NOTE — Progress Notes (Signed)
Patient denies any allergies to eggs or soy. Patient denies any problems with anesthesia/sedation. Patient denies any oxygen use at home. Patient denies taking any diet/weight loss medications or blood thinners. EMMI education offered, pt declined.  

## 2017-10-10 ENCOUNTER — Other Ambulatory Visit: Payer: Self-pay | Admitting: Internal Medicine

## 2017-10-13 ENCOUNTER — Other Ambulatory Visit: Payer: Self-pay | Admitting: Internal Medicine

## 2017-10-16 MED ORDER — INSULIN PEN NEEDLE 31G X 5 MM MISC: 3 refills | Status: DC

## 2017-10-18 ENCOUNTER — Encounter: Payer: Self-pay | Admitting: Gastroenterology

## 2017-10-25 ENCOUNTER — Encounter: Payer: Self-pay | Admitting: Gastroenterology

## 2017-10-25 MED ORDER — ONETOUCH DELICA LANCING DEV MISC: 11 refills | Status: DC

## 2017-10-25 MED ORDER — ONETOUCH DELICA LANCING DEV MISC: 11 refills | Status: AC

## 2017-10-28 ENCOUNTER — Encounter: Payer: Self-pay | Admitting: Gastroenterology

## 2017-11-04 ENCOUNTER — Other Ambulatory Visit: Payer: Self-pay | Admitting: Cardiology

## 2017-11-08 DIAGNOSIS — Z1231 Encounter for screening mammogram for malignant neoplasm of breast: Secondary | ICD-10-CM | POA: Diagnosis not present

## 2017-11-08 DIAGNOSIS — Z6827 Body mass index (BMI) 27.0-27.9, adult: Secondary | ICD-10-CM | POA: Diagnosis not present

## 2017-11-08 DIAGNOSIS — Z01419 Encounter for gynecological examination (general) (routine) without abnormal findings: Secondary | ICD-10-CM | POA: Diagnosis not present

## 2017-11-11 ENCOUNTER — Encounter: Payer: Self-pay | Admitting: Gastroenterology

## 2017-11-11 ENCOUNTER — Ambulatory Visit (AMBULATORY_SURGERY_CENTER): Payer: PPO | Admitting: Gastroenterology

## 2017-11-11 VITALS — BP 118/70 | HR 61 | Temp 98.0°F | Resp 11 | Ht 65.0 in | Wt 161.0 lb

## 2017-11-11 DIAGNOSIS — K319 Disease of stomach and duodenum, unspecified: Secondary | ICD-10-CM | POA: Diagnosis not present

## 2017-11-11 DIAGNOSIS — D509 Iron deficiency anemia, unspecified: Secondary | ICD-10-CM | POA: Diagnosis not present

## 2017-11-11 DIAGNOSIS — I252 Old myocardial infarction: Secondary | ICD-10-CM | POA: Diagnosis not present

## 2017-11-11 DIAGNOSIS — E119 Type 2 diabetes mellitus without complications: Secondary | ICD-10-CM | POA: Diagnosis not present

## 2017-11-11 DIAGNOSIS — I2510 Atherosclerotic heart disease of native coronary artery without angina pectoris: Secondary | ICD-10-CM | POA: Diagnosis not present

## 2017-11-11 DIAGNOSIS — D132 Benign neoplasm of duodenum: Secondary | ICD-10-CM | POA: Diagnosis not present

## 2017-11-11 DIAGNOSIS — K259 Gastric ulcer, unspecified as acute or chronic, without hemorrhage or perforation: Secondary | ICD-10-CM | POA: Diagnosis not present

## 2017-11-11 DIAGNOSIS — K3189 Other diseases of stomach and duodenum: Secondary | ICD-10-CM | POA: Diagnosis not present

## 2017-11-11 MED ORDER — SODIUM CHLORIDE 0.9 % IV SOLN: 500.0000 mL | Freq: Once | INTRAVENOUS | Status: DC

## 2017-11-11 NOTE — Progress Notes (Signed)
Called to room to assist during endoscopic procedure.  Patient ID and intended procedure confirmed with present staff. Received instructions for my participation in the procedure from the performing physician.  

## 2017-11-11 NOTE — Progress Notes (Signed)
Pt's states no medical or surgical changes since previsit or office visit. 

## 2017-11-11 NOTE — Progress Notes (Signed)
To recovery, report to RN, VSS. 

## 2017-11-11 NOTE — Op Note (Signed)
[facility] Patient Name: Morgan [name] Procedure Date: 11/11/2017 10:09 AM MRN: [id number] Endoscopist: Mauri Pole , MD Age: 67 Referring MD:  Date of Birth: Mar 29, 1950 Gender: Female Account #: [id number] Procedure:                Upper GI endoscopy Indications:              Surveillance procedure. Gastric nodule with atypia Medicines:                Monitored Anesthesia Care Procedure:                Pre-Anesthesia Assessment:                           - Prior to the procedure, a History and Physical                            was performed, and patient medications and                            allergies were reviewed. The patient's tolerance of                            previous anesthesia was also reviewed. The risks                            and benefits of the procedure and the sedation                            options and risks were discussed with the patient.                            All questions were answered, and informed consent                            was obtained. Prior Anticoagulants: The patient has                            taken no previous anticoagulant or antiplatelet                            agents. ASA Grade Assessment: III - A patient with                            severe systemic disease. After reviewing the risks                            and benefits, the patient was deemed in                            satisfactory condition to undergo the procedure.                           After obtaining informed consent, the endoscope was  passed under direct vision. Throughout the                            procedure, the patient's blood pressure, pulse, and                            oxygen saturations were monitored continuously. The                            Endoscope was introduced through the mouth, and                            advanced to the second part of duodenum. The upper        GI endoscopy was accomplished without difficulty.                            The patient tolerated the procedure well. Scope In: Scope Out: Findings:                 LA Grade B (one or more mucosal breaks greater than                            5 mm, not extending between the tops of two mucosal                            folds) esophagitis with no bleeding was found 38 to                            40 cm from the incisors.                           A small hiatal hernia was present.                           A single 12 mm mucosal papule (nodule) with no                            bleeding and no stigmata of recent bleeding was                            found in the prepyloric region of the stomach                            extending into the pylorus. Biopsies were taken                            with a cold forceps for histology.                           The examined duodenum was normal. Complications:            No immediate complications. Estimated Blood Loss:     Estimated blood loss was minimal. Impression:               -  LA Grade B reflux esophagitis.                           - Small hiatal hernia.                           - A single mucosal papule (nodule) found in the                            stomach. Biopsied.                           - Normal examined duodenum. Recommendation:           - Resume previous diet.                           - Continue present medications.                           - Await pathology results.                           - No ibuprofen, naproxen, or other non-steroidal                            anti-inflammatory drugs. Mauri Pole, MD 11/11/2017 10:34:53 AM This report has been signed electronically.

## 2017-11-11 NOTE — Patient Instructions (Signed)
Handouts given on GERD protocol. NO NSAIDS SUCH AS IBUPROFEN, NAPROXEN, ALEVE, ADVIL, ASPIRIN. TYLENOL ONLY FOR PAIN.  YOU HAD AN ENDOSCOPIC PROCEDURE TODAY AT [facility]:   Refer to the procedure report that was given to you for any specific questions about what was found during the examination.  If the procedure report does not answer your questions, please call your gastroenterologist to clarify.  If you requested that your care partner not be given the details of your procedure findings, then the procedure report has been included in a sealed envelope for you to review at your convenience later.  YOU SHOULD EXPECT: Some feelings of bloating in the abdomen. Passage of more gas than usual.  Walking can help get rid of the air that was put into your GI tract during the procedure and reduce the bloating. If you had a lower endoscopy (such as a colonoscopy or flexible sigmoidoscopy) you may notice spotting of blood in your stool or on the toilet paper. If you underwent a bowel prep for your procedure, you may not have a normal bowel movement for a few days.  Please Note:  You might notice some irritation and congestion in your nose or some drainage.  This is from the oxygen used during your procedure.  There is no need for concern and it should clear up in a day or so.  SYMPTOMS TO REPORT IMMEDIATELY:    Following upper endoscopy (EGD)  Vomiting of blood or coffee ground material  New chest pain or pain under the shoulder blades  Painful or persistently difficult swallowing  New shortness of breath  Fever of 100F or higher  Black, tarry-looking stools  For urgent or emergent issues, a gastroenterologist can be reached at any hour by calling [phone].   DIET:  We do recommend a small meal at first, but then you may proceed to your regular diet.  Drink plenty of fluids but you should avoid alcoholic beverages for 24 hours.  ACTIVITY:  You should plan to take it easy  for the rest of today and you should NOT DRIVE or use heavy machinery until tomorrow (because of the sedation medicines used during the test).    FOLLOW UP: Our staff will call the number listed on your records the next business day following your procedure to check on you and address any questions or concerns that you may have regarding the information given to you following your procedure. If we do not reach you, we will leave a message.  However, if you are feeling well and you are not experiencing any problems, there is no need to return our call.  We will assume that you have returned to your regular daily activities without incident.  If any biopsies were taken you will be contacted by phone or by letter within the next 1-3 weeks.  Please call us at [phone] if you have not heard about the biopsies in 3 weeks.    SIGNATURES/CONFIDENTIALITY: You and/or your care partner have signed paperwork which will be entered into your electronic medical record.  These signatures attest to the fact that that the information above on your After Visit Summary has been reviewed and is understood.  Full responsibility of the confidentiality of this discharge information lies with you and/or your care-partner.

## 2017-11-12 ENCOUNTER — Telehealth: Payer: Self-pay | Admitting: *Deleted

## 2017-11-12 NOTE — Telephone Encounter (Signed)
  Follow up Call-  Call back number 11/11/2017 08/26/2017  Post procedure Call Back phone  # [phone]  Permission to leave phone message Yes Yes  Some recent data might be hidden     Patient questions:  Do you have a fever, pain , or abdominal swelling? No. Pain Score  0 *  Have you tolerated food without any problems? Yes.    Have you been able to return to your normal activities? Yes.    Do you have any questions about your discharge instructions: Diet   No. Medications  No. Follow up visit  No.  Do you have questions or concerns about your Care? No.  Actions: * If pain score is 4 or above: No action needed, pain <4.

## 2017-12-04 ENCOUNTER — Encounter: Payer: Self-pay | Admitting: *Deleted

## 2017-12-19 NOTE — Progress Notes (Signed)
HPI: FU CAD. Previous PCI of her RCA following myocardial infarction in 2003. Echocardiogram in 2004 showed normal LV function. Abd ultrasound 11/15 showed no aneurysm. Nuclear study 9/16 showed EF 57 and inability to evaluate inferior wall but otherwise normal perfusion. Carotid Dopplers October 2017 showed 1-39% stenosis. There is note of abnormal thyroid tissue and dedicated thyroid ultrasound suggested. Thyroid ultrasound November 2017 showed borderline enlargement with moderately heterogeneous thyroid with no discrete nodule. Seen last seen, the patient denies any dyspnea on exertion, orthopnea, PND, pedal edema, palpitations, syncope or chest pain.    Current Outpatient Medications  Medication Sig Dispense Refill  . aspirin 81 MG tablet Take 81 mg by mouth daily.      Marland Kitchen atorvastatin (LIPITOR) 80 MG tablet TAKE 1 TABLET BY MOUTH EVERY DAY AT 6 PM 90 tablet 3  . clindamycin (CLEOCIN) 150 MG capsule Take by mouth. 4 tabs prior to dental work     . Cyanocobalamin (B-12 PO) Take by mouth.    . ezetimibe (ZETIA) 10 MG tablet TAKE 1 TABLET BY MOUTH EVERY DAY 90 tablet 0  . Ferrous Sulfate (IRON) 325 (65 Fe) MG TABS Take 1 tablet by mouth daily.    Marland Kitchen glucose blood (ONE TOUCH ULTRA TEST) test strip One Touch Ultra:  Use as instructed 100 each 12  . Insulin Pen Needle (B-D UF III MINI PEN NEEDLES) 31G X 5 MM MISC USE AS DIRECTED. 300 each 3  . INVOKANA 300 MG TABS tablet TAKE 1 TABLET(300 MG) BY MOUTH DAILY BEFORE BREAKFAST 90 tablet 0  . Lancet Devices (ONE TOUCH DELICA LANCING DEV) MISC Use as directed to check blood sugars twice to four time per day. Dx E11.9 1 each 11  . liraglutide (VICTOZA) 18 MG/3ML SOPN INJECT 1.8 MGUNDER THE SKIN DAILY 9 mL 1  . metFORMIN (GLUCOPHAGE-XR) 500 MG 24 hr tablet TAKE 2 TABLETS BY MOUTH TWICE DAILY WITH MEALS 360 tablet 1  . Multiple Vitamins-Minerals (MULTIVITAMIN PO) Take by mouth daily.    Marland Kitchen omeprazole (PRILOSEC) 20 MG capsule TAKE 1 CAPSULE BY MOUTH  EVERY DAY 30 capsule 2  . ONETOUCH DELICA LANCETS 57W MISC USE AS DIRECTED 100 each 2   No current facility-administered medications for this visit.      Past Medical History:  Diagnosis Date  . Anemia    as a child   . CAD, NATIVE VESSEL 05/13/2009  . DIABETES MELLITUS, TYPE II 12/09/2009  . Gastric ulcer   . Gastritis   . GERD (gastroesophageal reflux disease)   . Hx of abnormal cervical Pap smear    cryo  but normal since then   . HYPERLIPIDEMIA-MIXED 04/14/2008  . Intestinal metaplasia of gastric mucosa 2012  . Myocardial infarction ([place])    2003  . Obesity   . Occult blood in stools   . Osteopenia   . OVERWEIGHT/OBESITY 05/13/2009  . Ulcer    Duodenal ulcer by x-ray 40 years ago  . Varicose veins    under rx lawson 2016    Past Surgical History:  Procedure Laterality Date  . CESAREAN SECTION     x2  . CLOSED REDUCTION PROXIMAL TIBIOFIBULAR JOINT [place]  2004   left  . COLONOSCOPY    . DILATION AND CURETTAGE OF UTERUS    . ECTOPIC PREGNANCY SURGERY    . FIBULA FRACTURE SURGERY    . GANGLION CYST EXCISION    . TONSILLECTOMY    . TUBAL LIGATION  One Tube  . UPPER GASTROINTESTINAL ENDOSCOPY     08/27/17    Social History   Socioeconomic History  . Marital status: Widowed    Spouse name: Not on file  . Number of children: 2  . Years of education: phd   . Highest education level: Not on file  Occupational History  . Occupation: Mental Health Counselor    Comment: [employer]  . Occupation: Nurse, adult: [place]  . Financial resource strain: Not on file  . Food insecurity:    Worry: Not on file    Inability: Not on file  . Transportation needs:    Medical: Not on file    Non-medical: Not on file  Tobacco Use  . Smoking status: Former Smoker    Last attempt to quit: 02/05/1994    Years since quitting: 23.9  . Smokeless tobacco: Never Used  Substance and Sexual Activity  . Alcohol use: No     Alcohol/week: 0.0 standard drinks  . Drug use: No  . Sexual activity: Not on file  Lifestyle  . Physical activity:    Days per week: Not on file    Minutes per session: Not on file  . Stress: Not on file  Relationships  . Social connections:    Talks on phone: Not on file    Gets together: Not on file    Attends religious service: Not on file    Active member of club or organization: Not on file    Attends meetings of clubs or organizations: Not on file    Relationship status: Not on file  . Intimate partner violence:    Fear of current or ex partner: Not on file    Emotionally abused: Not on file    Physically abused: Not on file    Forced sexual activity: Not on file  Other Topics Concern  . 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    No firearms    NO reg exercise    Pets- Poodles 2    Sleep 7 hours     Family History  Problem Relation Age of Onset  . Emphysema Mother        Smoker  . Heart attack Mother        due to head injury  . Alcohol abuse Mother   . Heart disease Mother   . Early death Mother   . Varicose Veins Mother   . AAA (abdominal aortic aneurysm) Mother   . Lung cancer Father   . Heart attack Father   . Hypertension Father   . Alcohol abuse Father   . Heart disease Father        before age 47  . Aneurysm Father   . Hyperlipidemia Father   . Alcohol abuse Brother   . Diabetes Brother   . Heart disease Brother   . Heart attack Brother   . Diabetes Brother   . Aneurysm Paternal Grandmother   . Diabetes Unknown        grandfather  . Sudden Cardiac Death Brother   . Colon cancer Neg Hx   .  Esophageal cancer Neg Hx   . Stomach cancer Neg Hx     ROS: no fevers or chills, productive cough, hemoptysis, dysphasia, odynophagia, melena,  hematochezia, dysuria, hematuria, rash, seizure activity, orthopnea, PND, pedal edema, claudication. Remaining systems are negative.  Physical Exam: Well-developed well-nourished in no acute distress.  Skin is warm and dry.  HEENT is normal.  Neck is supple.  Chest is clear to auscultation with normal expansion.  Cardiovascular exam is regular rate and rhythm.  Abdominal exam nontender or distended. No masses palpated. Extremities show no edema. neuro grossly intact  ECG-sinus rhythm at a rate of 60.  First-degree AV block.  Occasional PVC.  No ST changes.  Personally reviewed  A/P  1 coronary artery disease-patient denies chest pain.  Plan to continue medical therapy with aspirin and statin.  2 hyperlipidemia-plan to continue Lipitor at present dose.   3 Life style modification-discussed importance of diet, exercise and weight loss.  4 carotid artery disease-mild on most recent carotid Dopplers.  5 preoperative evaluation-patient may require resection of stomach mass in the near future.  She has excellent functional capacity with no symptoms of chest pain or dyspnea.  She may proceed without further cardiac evaluation.  Kirk Ruths, MD

## 2017-12-27 ENCOUNTER — Other Ambulatory Visit: Payer: Self-pay | Admitting: Internal Medicine

## 2017-12-27 ENCOUNTER — Ambulatory Visit: Payer: PPO | Admitting: Cardiology

## 2017-12-27 ENCOUNTER — Encounter: Payer: Self-pay | Admitting: Cardiology

## 2017-12-27 VITALS — BP 136/82 | HR 60 | Ht 65.5 in | Wt 164.6 lb

## 2017-12-27 DIAGNOSIS — E7800 Pure hypercholesterolemia, unspecified: Secondary | ICD-10-CM | POA: Diagnosis not present

## 2017-12-27 DIAGNOSIS — Z01810 Encounter for preprocedural cardiovascular examination: Secondary | ICD-10-CM | POA: Diagnosis not present

## 2017-12-27 DIAGNOSIS — I2510 Atherosclerotic heart disease of native coronary artery without angina pectoris: Secondary | ICD-10-CM | POA: Diagnosis not present

## 2017-12-27 DIAGNOSIS — I493 Ventricular premature depolarization: Secondary | ICD-10-CM

## 2017-12-27 DIAGNOSIS — D225 Melanocytic nevi of trunk: Secondary | ICD-10-CM | POA: Diagnosis not present

## 2017-12-27 DIAGNOSIS — L821 Other seborrheic keratosis: Secondary | ICD-10-CM | POA: Diagnosis not present

## 2017-12-27 DIAGNOSIS — L700 Acne vulgaris: Secondary | ICD-10-CM | POA: Diagnosis not present

## 2017-12-27 NOTE — Patient Instructions (Signed)

## 2017-12-28 ENCOUNTER — Other Ambulatory Visit: Payer: Self-pay | Admitting: Internal Medicine

## 2018-01-06 ENCOUNTER — Encounter: Payer: Self-pay | Admitting: Gastroenterology

## 2018-01-06 ENCOUNTER — Ambulatory Visit: Payer: PPO | Admitting: Gastroenterology

## 2018-01-06 VITALS — BP 96/78 | HR 76 | Ht 65.25 in | Wt 161.4 lb

## 2018-01-06 DIAGNOSIS — D131 Benign neoplasm of stomach: Secondary | ICD-10-CM | POA: Diagnosis not present

## 2018-01-06 DIAGNOSIS — K317 Polyp of stomach and duodenum: Secondary | ICD-10-CM | POA: Diagnosis not present

## 2018-01-06 DIAGNOSIS — R198 Other specified symptoms and signs involving the digestive system and abdomen: Secondary | ICD-10-CM | POA: Diagnosis not present

## 2018-01-06 NOTE — Patient Instructions (Signed)
Your provider has requested that you go to the basement level for lab work 1 month prior to your procedure. Press "B" on the elevator. The lab is located at the first door on the left as you exit the elevator.  You have been scheduled for an eus with emr. Please follow written instructions given to you at your visit today. If you use inhalers (even only as needed), please bring them with you on the day of your procedure.  If you are age 66 or older, your body mass index should be between 23-30. Your Body mass index is 26.65 kg/m. If this is out of the aforementioned range listed, please consider follow up with your Primary Care Provider.  If you are age 43 or younger, your body mass index should be between 19-25. Your Body mass index is 26.65 kg/m. If this is out of the aformentioned range listed, please consider follow up with your Primary Care Provider.

## 2018-01-06 NOTE — Progress Notes (Signed)
[place] VISIT   Primary Care Provider Panosh, Standley Brooking, MD [place] [address] [phone]  Referring Provider Panosh, Standley Brooking, MD [address] [place],  [address] [phone]  Patient Profile: Morgan [name] is a 67 y.o. female with a pmh significant for CAD, DM, GERD, hx of PUD, Gastric Adenoma, Osteopenia.  The patient presents to the [facility] for an evaluation and management of problem(s) noted below:  Problem List 1. Gastric adenoma   2. Gastric polyp     History of Present Illness: The patient is referred for consideration of advanced resection of a noted antral/prepyloric/pyloric adenoma.  She is followed by Dr. Silverio Decamp and was noted on last 2 endoscopies to have evidence of aforementioned adenoma.  The patient is otherwise without significant GI symptoms currently.  I reviewed this case with Dr. Silverio Decamp previously and we felt that a possible EUS would be reasonable in addition to EMR dependent on how it looked visually at the time of a possible repeat endoscopy.  Patient states she just wants to know that this can be removed completely.  She denies any significant nausea or vomiting.  She denies dysphagia.  She has a history of nonsteroidal use but is not currently taking any.  GI Review of Systems Positive as above Negative for abdominal pain, change in bowel habits, melena, hematochezia, melena  Review of Systems General: Denies fevers/chills/weight loss HEENT: Denies oral lesions Cardiovascular: Denies chest pain Pulmonary: Denies shortness of breath Gastroenterological: See HPI Genitourinary: Denies darkened urine Hematological: Denies easy bruising/bleeding Dermatological: Denies jaundice Psychological: Mood is anxious about having this removed   Medications Current Outpatient Medications  Medication Sig Dispense Refill  . aspirin 81 MG tablet Take 81 mg by mouth  daily.      Marland Kitchen atorvastatin (LIPITOR) 80 MG tablet TAKE 1 TABLET BY MOUTH EVERY DAY AT 6 PM 90 tablet 3  . clindamycin (CLEOCIN) 150 MG capsule Take by mouth. 4 tabs prior to dental work     . Cyanocobalamin (B-12 PO) Take by mouth.    . ezetimibe (ZETIA) 10 MG tablet TAKE 1 TABLET BY MOUTH EVERY DAY 90 tablet 0  . Ferrous Sulfate (IRON) 325 (65 Fe) MG TABS Take 1 tablet by mouth daily.    Marland Kitchen glucose blood (ONE TOUCH ULTRA TEST) test strip One Touch Ultra:  Use as instructed 100 each 12  . Insulin Pen Needle (B-D UF III MINI PEN NEEDLES) 31G X 5 MM MISC USE AS DIRECTED. 300 each 3  . INVOKANA 300 MG TABS tablet TAKE 1 TABLET(300 MG) BY MOUTH DAILY BEFORE BREAKFAST 90 tablet 0  . Lancet Devices (ONE TOUCH DELICA LANCING DEV) MISC Use as directed to check blood sugars twice to four time per day. Dx E11.9 1 each 11  . liraglutide (VICTOZA) 18 MG/3ML SOPN INJECT 1.8 MGUNDER THE SKIN DAILY 9 mL 1  . metFORMIN (GLUCOPHAGE-XR) 500 MG 24 hr tablet TAKE 2 TABLETS BY MOUTH TWICE DAILY WITH MEALS 360 tablet 1  . Multiple Vitamins-Minerals (MULTIVITAMIN PO) Take by mouth daily.    Marland Kitchen omeprazole (PRILOSEC) 20 MG capsule TAKE 1 CAPSULE BY MOUTH EVERY DAY 30 capsule 2  . ONETOUCH DELICA LANCETS 17C MISC USE AS DIRECTED 100 each 2   No current facility-administered medications for this visit.     Allergies Allergies  Allergen Reactions  . Penicillins Other (See Comments)    REACTION: as a child    Histories Past Medical History:  Diagnosis Date  . Anemia    as a child   . CAD, NATIVE VESSEL 05/13/2009  . DIABETES MELLITUS, TYPE II 12/09/2009  . Gastric ulcer   . Gastritis   . GERD (gastroesophageal reflux disease)   . Hx of abnormal cervical Pap smear    cryo  but normal since then   . HYPERLIPIDEMIA-MIXED 04/14/2008  . Intestinal metaplasia of gastric mucosa 2012  . Myocardial infarction ([place])    2003  . Obesity   . Occult blood in stools   . Osteopenia   . OVERWEIGHT/OBESITY 05/13/2009  .  Ulcer    Duodenal ulcer by x-ray 40 years ago  . Varicose veins    under rx lawson 2016   Past Surgical History:  Procedure Laterality Date  . CESAREAN SECTION     x2  . CLOSED REDUCTION PROXIMAL TIBIOFIBULAR JOINT [place]  2004   left  . COLONOSCOPY    . DILATION AND CURETTAGE OF UTERUS    . ECTOPIC PREGNANCY SURGERY    . FIBULA FRACTURE SURGERY    . GANGLION CYST EXCISION    . TONSILLECTOMY    . TUBAL LIGATION     One Tube  . UPPER GASTROINTESTINAL ENDOSCOPY     08/27/17   Social History   Socioeconomic History  . Marital status: Widowed    Spouse name: Not on file  . Number of children: 2  . Years of education: phd   . Highest education level: Not on file  Occupational History  . Occupation: Mental Health Counselor    Comment: [employer]  . 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 Financial resource strain: Not on file  . Food insecurity:    Worry: Not on file    Inability: Not on file  . Transportation needs:    Medical: Not on file    Non-medical: Not on file  Tobacco Use  . Smoking status: Former Smoker    Last attempt to quit: 02/05/1994    Years since quitting: 23.9  . Smokeless tobacco: Never Used  Substance and Sexual Activity  . Alcohol use: No    Alcohol/week: 0.0 standard drinks  . Drug use: No  . Sexual activity: Not on file  Lifestyle  . Physical activity:    Days per week: Not on file    Minutes per session: Not on file  . Stress: Not on file  Relationships  . Social connections:    Talks on phone: Not on file    Gets together: Not on file    Attends religious service: Not on file    Active member of club or organization: Not on file    Attends meetings of clubs or organizations: Not on file    Relationship status: Not on file  . Intimate partner violence:    Fear of current or ex partner: Not on file    Emotionally abused: Not on file    Physically abused: Not on file    Forced sexual  activity: Not on file  Other Topics Concern  . 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  No firearms    NO reg exercise    Pets- Poodles 2    Sleep 7 hours    Family History  Problem Relation Age of Onset  . Emphysema Mother        Smoker  . Heart attack Mother        due to head injury  . Alcohol abuse Mother   . Heart disease Mother   . Early death Mother   . Varicose Veins Mother   . AAA (abdominal aortic aneurysm) Mother   . Lung cancer Father   . Heart attack Father   . Hypertension Father   . Alcohol abuse Father   . Heart disease Father        before age 60  . Aneurysm Father   . Hyperlipidemia Father   . Alcohol abuse Brother   . Diabetes Brother   . Heart disease Brother   . Heart attack Brother   . Diabetes Brother   . Aneurysm Paternal Grandmother   . Diabetes Unknown        grandfather  . Sudden Cardiac Death Brother   . Colon cancer Neg Hx   . Esophageal cancer Neg Hx   . Stomach cancer Neg Hx   . Inflammatory bowel disease Neg Hx   . Liver disease Neg Hx   . Pancreatic cancer Neg Hx   . Rectal cancer Neg Hx    I have reviewed her medical, social, and family history in detail and updated the electronic medical record as necessary.    PHYSICAL EXAMINATION  BP 96/78 (BP Location: Left Arm, Patient Position: Sitting, Cuff Size: Normal)   Pulse 76   Ht 5' 5.25" (1.657 m)   Wt 161 lb 6 oz (73.2 kg)   BMI 26.65 kg/m  Wt Readings from Last 3 Encounters:  01/06/18 161 lb 6 oz (73.2 kg)  12/27/17 164 lb 9.6 oz (74.7 kg)  11/11/17 161 lb (73 kg)  GEN: NAD, appears stated age, doesn't appear chronically ill PSYCH: Cooperative, without pressured speech EYE: Conjunctivae pink, sclerae anicteric ENT: MMM, without oral ulcers NECK: Supple CV:  RR without R/Gs  RESP: CTAB posteriorly, without wheezing GI: NABS, soft, rounded, NT/ND, without rebound or guarding, no HSM appreciated MSK/EXT: No significant lower extremity edema SKIN: No jaundice NEURO:  Alert & Oriented x 3, no focal deficits   REVIEW OF DATA  I reviewed the following data at the time of this encounter:  GI Procedures and Studies  November 11, 2017 EGD - LA Grade B reflux esophagitis. - Small hiatal hernia. - A single mucosal papule (nodule) found in the stomach. Biopsied. - Normal examined duodenum. Diagnosis Surgical [P], pre-pyloric nodule - DUODENAL TUBULAR ADENOMA. - NO HIGH GRADE DYSPLASIA OR INVASIVE CARCINOMA.  August 26, 2017 EGD - Normal esophagus. - Z-line regular, 35 cm from the incisors. - Small hiatal hernia. - Gastritis. Biopsied. - Rule out malignancy, gastric tumor in the prepyloric region of the stomach. Biopsied. - Normal examined duodenum. Diagnosis 1. 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 - SEE COMMENT. 2. Stomach, biopsy, gastric random - CHRONIC INACTIVE GASTRITIS. - THERE IS NO EVIDENCE OF HELICOBACTER PYLORI, DYSPLASIA, OR MALIGNANCY. - SEE COMMENT. Microscopic Comment 1. The biopsy fragments are somewhat superficial, and therefore a more ominous process cannot be entirely ruled out. 2. A Warthin-Starry stain is negative for the presence of Helicobacter pylori organisms.  Laboratory Studies  Reviewed in epic  Imaging Studies  No relevant studies to review   ASSESSMENT  Morgan [name] is a 67 y.o. female with a pmh significant for CAD, DM, GERD, hx of PUD, Gastric Adenoma, Osteopenia.  The patient is seen today for evaluation and management of:  1. Gastric adenoma   2. Gastric polyp    The patient is clinically and hemodynamically stable.  She has evidence of gastric adenoma that requires some additional therapy evaluation.  Looking at the endoscopic photos it does  look like this is something that may be amenable to endoscopic resection.  I think scheduling her for an EUS with possible EMR is reasonable to further evaluate and ensure that is not deeper.  The other concern I have is if the lesion is truly encroaching and entering into the duodenum but not sure a endoscopic approach will be possible at that point because biopsies suggest duodenal polyp.  If this were the case then surgical evaluation and resection with distal gastrectomy and partial duodenectomy would likely be the best next step for the patient.  Based upon the description and endoscopic pictures I do feel that it is reasonable to pursue an Advanced Polypectomy attempt of the polyp/lesion.  We discussed some of the techniques of advanced polypectomy which include Endoscopic Mucosal Resection, OVESCO Full-Thickness Resection, Endorotor Morcellation, and Tissue Ablation via Fulguration.  The risks and benefits of endoscopic evaluation were discussed with the patient; these include but are not limited to the risk of perforation, infection, bleeding, missed lesions, lack of diagnosis, severe illness requiring hospitalization, as well as anesthesia and sedation related illnesses.  During attempts at advanced polypectomy, the risks of bleeding and perforation/leak are increased as opposed to diagnostic and screening endoscopies, and that was discussed with the patient as well.  I did offer, a referral to surgery as well to discuss consideration of a surgical intervention for prior to finalizing decision for attempt at endoscopic removal; the patient has deferred on this.  If, after attempt at removal of the polyp, it is found that the patient has a complication or that an invasive lesion or malignant lesion is found, or that the polyp continues to recur, the patient is aware and understands that a surgery may still be indicated/required.  In addition, with the possible need for piecemeal resection, subsequent  short-interval colonoscopies for follow up and treatment of the lesion may be necessary and was also explained.  The risks of EUS including bleeding, infection, aspiration pneumonia and intestinal perforation were discussed as was the possibility it may not give a definitive diagnosis.  All patient questions were answered, to the best of my ability, and the patient agrees to the aforementioned plan of action with follow-up as indicated.   PLAN  Proceed with scheduling EGD/EUS with EMR 90 minutes  Orders Placed This Encounter  Procedures  . CBC with Differential/Platelet  . INR/PT  . Basic metabolic panel  . Ambulatory referral to Gastroenterology    New Prescriptions   No medications on file   Modified Medications   No medications on file    Planned Follow Up: No follow-ups on file.   Justice Britain, MD [facility] Advanced Endoscopy Office # [phone]

## 2018-01-07 ENCOUNTER — Encounter: Payer: Self-pay | Admitting: Gastroenterology

## 2018-01-10 ENCOUNTER — Other Ambulatory Visit: Payer: Self-pay | Admitting: Internal Medicine

## 2018-01-10 ENCOUNTER — Encounter: Payer: Self-pay | Admitting: Gastroenterology

## 2018-01-10 DIAGNOSIS — D131 Benign neoplasm of stomach: Secondary | ICD-10-CM | POA: Insufficient documentation

## 2018-01-10 DIAGNOSIS — R198 Other specified symptoms and signs involving the digestive system and abdomen: Secondary | ICD-10-CM | POA: Insufficient documentation

## 2018-01-10 DIAGNOSIS — K317 Polyp of stomach and duodenum: Secondary | ICD-10-CM | POA: Insufficient documentation

## 2018-01-13 ENCOUNTER — Other Ambulatory Visit (INDEPENDENT_AMBULATORY_CARE_PROVIDER_SITE_OTHER): Payer: PPO

## 2018-01-13 DIAGNOSIS — K317 Polyp of stomach and duodenum: Secondary | ICD-10-CM | POA: Diagnosis not present

## 2018-01-13 DIAGNOSIS — D131 Benign neoplasm of stomach: Secondary | ICD-10-CM | POA: Diagnosis not present

## 2018-01-13 LAB — BASIC METABOLIC PANEL
BUN: 10 mg/dL (ref 6–23)
CO2: 25 mEq/L (ref 19–32)
Calcium: 9.5 mg/dL (ref 8.4–10.5)
Chloride: 99 mEq/L (ref 96–112)
Creatinine, Ser: 0.67 mg/dL (ref 0.40–1.20)
GFR: 93.37 mL/min (ref >60.00)
Glucose, Bld: 218 mg/dL — ABNORMAL HIGH (ref 70–99)
Potassium: 4.3 mEq/L (ref 3.5–5.1)
Sodium: 137 mEq/L (ref 135–145)

## 2018-01-13 LAB — CBC WITH DIFFERENTIAL/PLATELET
BASOS PCT: 0.8 % (ref 0.0–3.0)
Basophils Absolute: 0.1 10*3/uL (ref 0.0–0.1)
Eosinophils Absolute: 0 10*3/uL (ref 0.0–0.7)
Eosinophils Relative: 0.4 % (ref 0.0–5.0)
HCT: 42.8 % (ref 36.0–46.0)
Hemoglobin: 14.7 g/dL (ref 12.0–15.0)
Lymphocytes Relative: 24.5 % (ref 12.0–46.0)
Lymphs Abs: 1.7 10*3/uL (ref 0.7–4.0)
MCHC: 34.3 g/dL (ref 30.0–36.0)
MCV: 92.5 fl (ref 78.0–100.0)
Monocytes Absolute: 0.5 10*3/uL (ref 0.1–1.0)
Monocytes Relative: 7.1 % (ref 3.0–12.0)
Neutro Abs: 4.7 10*3/uL (ref 1.4–7.7)
Neutrophils Relative %: 67.2 % (ref 43.0–77.0)
Platelets: 245 10*3/uL (ref 150.0–400.0)
RBC: 4.63 Mil/uL (ref 3.87–5.11)
RDW: 12.9 % (ref 11.5–15.5)
WBC: 6.9 10*3/uL (ref 4.0–10.5)

## 2018-01-13 LAB — PROTIME-INR
INR: 0.9 ratio (ref 0.8–1.0)
Prothrombin Time: 10.1 s (ref 9.6–13.1)

## 2018-01-23 MED ORDER — ATORVASTATIN CALCIUM 80 MG PO TABS: ORAL_TABLET | ORAL | 1 refills | Status: DC

## 2018-01-23 MED ORDER — OMEPRAZOLE 20 MG PO CPDR: DELAYED_RELEASE_CAPSULE | ORAL | 1 refills | Status: DC

## 2018-01-28 ENCOUNTER — Other Ambulatory Visit: Payer: Self-pay

## 2018-01-28 NOTE — Telephone Encounter (Signed)
received a fax from [place], [place] asking for [facility] [employer].  This pharmacy is not in the patients chart. Call to verify.

## 2018-01-28 NOTE — Telephone Encounter (Signed)
Left message for pt to call back crm created

## 2018-01-28 NOTE — Telephone Encounter (Signed)
Pt does not use this pharmacy no refill needed. Pt would like 3 month supply for all medication for future refills since it is cheaper for her.

## 2018-02-11 ENCOUNTER — Encounter (HOSPITAL_COMMUNITY): Payer: Self-pay | Admitting: *Deleted

## 2018-02-11 ENCOUNTER — Other Ambulatory Visit: Payer: Self-pay | Admitting: Internal Medicine

## 2018-02-11 NOTE — Telephone Encounter (Signed)
need a rx that says patient can test up to bid, she is almost out of test strips and lancets and her insurance won't cover because it's been billed for her check once daily. Thanks! Per pharmacy

## 2018-02-11 NOTE — Anesthesia Preprocedure Evaluation (Addendum)
Anesthesia Evaluation  Patient identified by MRN, date of birth, ID band Patient awake    Reviewed: Allergy & Precautions, NPO status , Patient's Chart, lab work & pertinent test results  History of Anesthesia Complications Negative for: history of anesthetic complications  Airway Mallampati: II  TM Distance: >3 FB Neck ROM: Full    Dental no notable dental hx.    Pulmonary neg pulmonary ROS, former smoker,    Pulmonary exam normal        Cardiovascular + CAD, + Past MI and + Cardiac Stents  Normal cardiovascular exam     Neuro/Psych negative neurological ROS  negative psych ROS   GI/Hepatic Neg liver ROS, PUD, GERD  ,  Endo/Other  diabetes, Insulin Dependent  Renal/GU negative Renal ROS  negative genitourinary   Musculoskeletal negative musculoskeletal ROS (+)   Abdominal   Peds  Hematology negative hematology ROS (+)   Anesthesia Other Findings 68 yo F for EUS - CAD/MI s/p PCI to RCA (2003), 1st deg AVB, IDDM, GERD/PUD - MPS 10/29/14: The inferior wall cannot be evaluated on the current study. Low risk stress nuclear study with otherwise normal perfusion and normal left ventricular regional and global systolic function. The previous study in 2011 described inferior wall scar with peri-infarct ischemia, but normal LVEF (73%)  Reproductive/Obstetrics                            Anesthesia Physical Anesthesia Plan  ASA: III  Anesthesia Plan: MAC   Post-op Pain Management:    Induction:   PONV Risk Score and Plan: 2 and Propofol infusion and Treatment may vary due to age or medical condition  Airway Management Planned: Nasal Cannula and Simple Face Mask  Additional Equipment: None  Intra-op Plan:   Post-operative Plan:   Informed Consent: I have reviewed the patients History and Physical, chart, labs and discussed the procedure including the risks, benefits and alternatives for  the proposed anesthesia with the patient or authorized representative who has indicated his/her understanding and acceptance.     Plan Discussed with:   Anesthesia Plan Comments:        Anesthesia Quick Evaluation

## 2018-02-12 ENCOUNTER — Ambulatory Visit (HOSPITAL_COMMUNITY)
Admission: RE | Admit: 2018-02-12 | Discharge: 2018-02-12 | Disposition: A | Discharge status: Home / Self Care | Payer: PPO | Attending: Gastroenterology | Admitting: Gastroenterology

## 2018-02-12 ENCOUNTER — Ambulatory Visit (HOSPITAL_COMMUNITY): Payer: PPO | Admitting: Anesthesiology

## 2018-02-12 ENCOUNTER — Encounter (HOSPITAL_COMMUNITY)
Admission: RE | Disposition: A | Discharge status: Home / Self Care | Payer: Self-pay | Source: Home / Self Care | Attending: Gastroenterology

## 2018-02-12 ENCOUNTER — Other Ambulatory Visit: Payer: Self-pay

## 2018-02-12 ENCOUNTER — Encounter (HOSPITAL_COMMUNITY): Payer: Self-pay | Admitting: *Deleted

## 2018-02-12 DIAGNOSIS — Z955 Presence of coronary angioplasty implant and graft: Secondary | ICD-10-CM | POA: Diagnosis not present

## 2018-02-12 DIAGNOSIS — K210 Gastro-esophageal reflux disease with esophagitis: Secondary | ICD-10-CM | POA: Insufficient documentation

## 2018-02-12 DIAGNOSIS — Z87891 Personal history of nicotine dependence: Secondary | ICD-10-CM | POA: Insufficient documentation

## 2018-02-12 DIAGNOSIS — K3189 Other diseases of stomach and duodenum: Secondary | ICD-10-CM | POA: Diagnosis not present

## 2018-02-12 DIAGNOSIS — K317 Polyp of stomach and duodenum: Secondary | ICD-10-CM | POA: Insufficient documentation

## 2018-02-12 DIAGNOSIS — I899 Noninfective disorder of lymphatic vessels and lymph nodes, unspecified: Secondary | ICD-10-CM

## 2018-02-12 DIAGNOSIS — Z6828 Body mass index (BMI) 28.0-28.9, adult: Secondary | ICD-10-CM | POA: Insufficient documentation

## 2018-02-12 DIAGNOSIS — K209 Esophagitis, unspecified: Secondary | ICD-10-CM

## 2018-02-12 DIAGNOSIS — I252 Old myocardial infarction: Secondary | ICD-10-CM | POA: Diagnosis not present

## 2018-02-12 DIAGNOSIS — E785 Hyperlipidemia, unspecified: Secondary | ICD-10-CM | POA: Insufficient documentation

## 2018-02-12 DIAGNOSIS — Z79899 Other long term (current) drug therapy: Secondary | ICD-10-CM | POA: Diagnosis not present

## 2018-02-12 DIAGNOSIS — K449 Diaphragmatic hernia without obstruction or gangrene: Secondary | ICD-10-CM | POA: Diagnosis not present

## 2018-02-12 DIAGNOSIS — I2510 Atherosclerotic heart disease of native coronary artery without angina pectoris: Secondary | ICD-10-CM | POA: Diagnosis not present

## 2018-02-12 DIAGNOSIS — E119 Type 2 diabetes mellitus without complications: Secondary | ICD-10-CM | POA: Diagnosis not present

## 2018-02-12 DIAGNOSIS — Z794 Long term (current) use of insulin: Secondary | ICD-10-CM | POA: Insufficient documentation

## 2018-02-12 DIAGNOSIS — Z7982 Long term (current) use of aspirin: Secondary | ICD-10-CM | POA: Diagnosis not present

## 2018-02-12 DIAGNOSIS — D131 Benign neoplasm of stomach: Secondary | ICD-10-CM

## 2018-02-12 DIAGNOSIS — E669 Obesity, unspecified: Secondary | ICD-10-CM | POA: Insufficient documentation

## 2018-02-12 HISTORY — PX: ESOPHAGOGASTRODUODENOSCOPY: SHX5428

## 2018-02-12 HISTORY — PX: POLYPECTOMY: SHX5525

## 2018-02-12 HISTORY — PX: EUS: SHX5427

## 2018-02-12 LAB — GLUCOSE, CAPILLARY
Glucose-Capillary: 122 mg/dL — ABNORMAL HIGH (ref 70–99)
Glucose-Capillary: 199 mg/dL — ABNORMAL HIGH (ref 70–99)

## 2018-02-12 SURGERY — UPPER ENDOSCOPIC ULTRASOUND (EUS) RADIAL
Anesthesia: Monitor Anesthesia Care

## 2018-02-12 MED ORDER — OMEPRAZOLE 40 MG PO CPDR: DELAYED_RELEASE_CAPSULE | ORAL | 1 refills | Status: DC

## 2018-02-12 MED ORDER — PROPOFOL 10 MG/ML IV BOLUS
INTRAVENOUS | Status: AC
  Filled 2018-02-12: qty 20

## 2018-02-12 MED ORDER — SODIUM CHLORIDE 0.9 % IV SOLN: INTRAVENOUS | Status: DC

## 2018-02-12 MED ORDER — LACTATED RINGERS IV SOLN
INTRAVENOUS | Status: DC
  Administered 2018-02-12: 1000 mL via INTRAVENOUS

## 2018-02-12 MED ORDER — PROPOFOL 10 MG/ML IV BOLUS
INTRAVENOUS | Status: AC
  Filled 2018-02-12: qty 80

## 2018-02-12 MED ORDER — PROPOFOL 10 MG/ML IV BOLUS
INTRAVENOUS | Status: DC | PRN
  Administered 2018-02-12: 30 mg via INTRAVENOUS
  Administered 2018-02-12: 10 mg via INTRAVENOUS
  Administered 2018-02-12: 30 mg via INTRAVENOUS
  Administered 2018-02-12: 20 mg via INTRAVENOUS
  Administered 2018-02-12: 30 mg via INTRAVENOUS
  Administered 2018-02-12: 20 mg via INTRAVENOUS
  Administered 2018-02-12: 10 mg via INTRAVENOUS
  Administered 2018-02-12: 30 mg via INTRAVENOUS
  Administered 2018-02-12 (×6): 20 mg via INTRAVENOUS
  Administered 2018-02-12: 30 mg via INTRAVENOUS

## 2018-02-12 MED ORDER — GLUCAGON HCL RDNA (DIAGNOSTIC) 1 MG IJ SOLR
INTRAMUSCULAR | Status: AC
  Filled 2018-02-12: qty 1

## 2018-02-12 MED ORDER — GLUCAGON HCL RDNA (DIAGNOSTIC) 1 MG IJ SOLR
INTRAMUSCULAR | Status: DC | PRN
  Administered 2018-02-12 (×2): .25 mg via INTRAVENOUS
  Administered 2018-02-12: 0.25 mg via INTRAVENOUS

## 2018-02-12 MED ORDER — PROPOFOL 500 MG/50ML IV EMUL
INTRAVENOUS | Status: DC | PRN
  Administered 2018-02-12: 130 ug/kg/min via INTRAVENOUS

## 2018-02-12 NOTE — Transfer of Care (Signed)
Immediate Anesthesia Transfer of Care Note  Patient: Morgan [name]  Procedure(s) Performed: UPPER ENDOSCOPIC ULTRASOUND (EUS) RADIAL (N/A ) ENDOSCOPIC MUCOSAL RESECTION (N/A ) POLYPECTOMY SUBMUCOSAL LIFTING INJECTION HEMOSTASIS CLIP PLACEMENT  Patient Location: PACU  Anesthesia Type:MAC  Level of Consciousness: awake, alert  and oriented  Airway & Oxygen Therapy: Patient Spontanous Breathing and Patient connected to nasal cannula oxygen  Post-op Assessment: Report given to RN and Post -op Vital signs reviewed and stable  Post vital signs: Reviewed and stable  Last Vitals:  Vitals Value Taken Time  BP 107/50 02/12/2018 10:26 AM  Temp    Pulse 71 02/12/2018 10:27 AM  Resp 20 02/12/2018 10:27 AM  SpO2 90 % 02/12/2018 10:27 AM  Vitals shown include unvalidated device data.  Last Pain:  Vitals:   02/12/18 0802  TempSrc: Oral  PainSc: 0-No pain         Complications: No apparent anesthesia complications

## 2018-02-12 NOTE — H&P (Signed)
GASTROENTEROLOGY OUTPATIENT PROCEDURE H&P NOTE   Primary Care Physician: Burnis Medin, MD  HPI: Morgan [name] is a 68 y.o. female who presents for EGD/EUS with EMR.  Past Medical History:  Diagnosis Date  . Anemia    as a child   . CAD, NATIVE VESSEL 05/13/2009  . DIABETES MELLITUS, TYPE II 12/09/2009  . Gastric ulcer   . Gastritis   . GERD (gastroesophageal reflux disease)   . Hx of abnormal cervical Pap smear    cryo  but normal since then   . HYPERLIPIDEMIA-MIXED 04/14/2008  . Intestinal metaplasia of gastric mucosa 2012  . Myocardial infarction ([place])    2003  . Obesity   . Occult blood in stools   . Osteopenia   . OVERWEIGHT/OBESITY 05/13/2009  . Ulcer    Duodenal ulcer by x-ray 40 years ago  . Varicose veins    under rx [facility] 2016   Past Surgical History:  Procedure Laterality Date  . CESAREAN SECTION     x2  . CLOSED REDUCTION PROXIMAL TIBIOFIBULAR JOINT [place]  2004   left  . COLONOSCOPY    . DILATION AND CURETTAGE OF UTERUS    . ECTOPIC PREGNANCY SURGERY    . FIBULA FRACTURE SURGERY    . GANGLION CYST EXCISION    . TONSILLECTOMY    . TUBAL LIGATION     One Tube  . UPPER GASTROINTESTINAL ENDOSCOPY     08/27/17   Current Facility-Administered Medications  Medication Dose Route Frequency Provider Last Rate Last Dose  . 0.9 %  sodium chloride infusion   Intravenous Continuous Mansouraty, Telford Nab., MD      . lactated ringers infusion   Intravenous Continuous Mansouraty, Telford Nab., MD 10 mL/hr at 02/12/18 0810 1,000 mL at 02/12/18 0810   Allergies  Allergen Reactions  . Penicillins Other (See Comments)    REACTION: as a child   Family History  Problem Relation Age of Onset  . Emphysema Mother        Smoker  . Heart attack Mother        due to head injury  . Alcohol abuse Mother   . Heart disease Mother   . Early death Mother   . Varicose Veins Mother   . AAA (abdominal aortic aneurysm) Mother   . Lung cancer Father   . Heart  attack Father   . Hypertension Father   . Alcohol abuse Father   . Heart disease Father        before age 81  . Aneurysm Father   . Hyperlipidemia Father   . Alcohol abuse Brother   . Diabetes Brother   . Heart disease Brother   . Heart attack Brother   . Diabetes Brother   . Aneurysm Paternal Grandmother   . Diabetes Other        grandfather  . Sudden Cardiac Death Brother   . Colon cancer Neg Hx   . Esophageal cancer Neg Hx   . Stomach cancer Neg Hx   . Inflammatory bowel disease Neg Hx   . Liver disease Neg Hx   . Pancreatic cancer Neg Hx   . Rectal cancer Neg Hx    Social History   Socioeconomic History  . Marital status: Widowed    Spouse name: Not on file  . Number of children: 2  . Years of education: phd   . Highest education level: Not on file  Occupational History  . Occupation: Licensed conveyancer  Comment: [employer]  . Occupation: Nurse, adult: [employer]  . Financial resource strain: Not on file  . Food insecurity:    Worry: Not on file    Inability: Not on file  . Transportation needs:    Medical: Not on file    Non-medical: Not on file  Tobacco Use  . Smoking status: Former Smoker    Last attempt to quit: 02/05/1994    Years since quitting: 24.0  . Smokeless tobacco: Never Used  Substance and Sexual Activity  . Alcohol use: No    Alcohol/week: 0.0 standard drinks  . Drug use: No  . Sexual activity: Not on file  Lifestyle  . Physical activity:    Days per week: Not on file    Minutes per session: Not on file  . Stress: Not on file  Relationships  . Social connections:    Talks on phone: Not on file    Gets together: Not on file    Attends religious service: Not on file    Active member of club or organization: Not on file    Attends meetings of clubs or organizations: Not on file    Relationship status: Not on file  . Intimate partner violence:    Fear of current or ex partner: Not on  file    Emotionally abused: Not on file    Physically abused: Not on file    Forced sexual activity: Not on file  Other Topics Concern  . 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    No firearms    NO reg exercise    Pets- Poodles 2    Sleep 7 hours     Physical Exam: Vital signs in last 24 hours: Temp:  [97.6 F (36.4 C)] 97.6 F (36.4 C) (01/08 0802) Pulse Rate:  [62] 62 (01/08 0802) BP: (118)/(57) 118/57 (01/08 0802) SpO2:  [100 %] 100 % (01/08 0802) Weight:  [71.7 kg] 71.7 kg (01/08 0802)   GEN: NAD EYE: Sclerae anicteric ENT: MMM CV: RR without R/Gs  RESP: CTAB posteriorly GI: Soft, NT/ND NEURO:  Alert & Oriented x 3  Lab Results: No results for input(s): WBC, HGB, HCT, PLT in the last 72 hours. BMET No results for input(s): NA, K, CL, CO2, GLUCOSE, BUN, CREATININE, CALCIUM in the last 72 hours. LFT No results for input(s): PROT, ALBUMIN, AST, ALT, ALKPHOS, BILITOT, BILIDIR, IBILI in the last 72 hours. PT/INR No results for input(s): LABPROT, INR in the last 72 hours.   Impression / Plan: This is a 68 y.o.female who presents for EGD/EUS with EMR.  The risks of EUS including bleeding, infection, aspiration pneumonia and intestinal perforation were discussed as was the possibility it may not give a definitive diagnosis.  If a biopsy of the pancreas is done as part of the EUS, there is an additional risk of pancreatitis at the rate of about 1%.  It was explained that procedure related pancreatitis is typically mild, although can be severe and even life threatening, which is why we do not perform random pancreatic biopsies and only biopsy a lesion we feel is  concerning enough to warrant the risk.  The risks and benefits of endoscopic evaluation were discussed  with the patient; these include but are not limited to the risk of perforation, infection, bleeding, missed lesions, lack of diagnosis, severe illness requiring hospitalization, as well as anesthesia and sedation related illnesses.  The patient is agreeable to proceed.    Justice Britain, MD [facility] Advanced [facility]oscopy Office # [phone]

## 2018-02-12 NOTE — Anesthesia Postprocedure Evaluation (Signed)
Anesthesia Post Note  Patient: Morgan [name]  Procedure(s) Performed: UPPER ENDOSCOPIC ULTRASOUND (EUS) RADIAL (N/A ) ENDOSCOPIC MUCOSAL RESECTION (N/A ) POLYPECTOMY SUBMUCOSAL LIFTING INJECTION HEMOSTASIS CLIP PLACEMENT     Patient location during evaluation: PACU Anesthesia Type: MAC Level of consciousness: awake and alert Pain management: pain level controlled Vital Signs Assessment: post-procedure vital signs reviewed and stable Respiratory status: spontaneous breathing, nonlabored ventilation and respiratory function stable Cardiovascular status: blood pressure returned to baseline and stable Postop Assessment: no apparent nausea or vomiting Anesthetic complications: no    Last Vitals:  Vitals:   02/12/18 1030 02/12/18 1040  BP: (!) 113/53 (!) 126/58  Pulse: 69 66  Resp: 19 16  Temp:    SpO2: 100% 96%    Last Pain:  Vitals:   02/12/18 1026  TempSrc: Axillary  PainSc: 0-No pain                 Lidia Collum

## 2018-02-12 NOTE — Op Note (Addendum)
[facility] Patient Name: Morgan [name] Procedure Date: 02/12/2018 MRN: [id number] Attending MD: Justice Britain , MD Date of Birth: 1950-08-10 CSN: [id number] Age: 68 Admit Type: Outpatient Procedure:                Upper EUS Indications:              Gastric mucosal mass/polyp found on endoscopy,                            Therapeutic procedure, Gastric polyps, For therapy                            of gastric polyps Providers:                Justice Britain, MD, Burtis Junes, RN, Charolette Child, Technician Referring MD:             Mauri Pole, MD Medicines:                Monitored Anesthesia Care, Glucagon 1 mg                            administered over entire procedure Complications:            No immediate complications. Estimated Blood Loss:     Estimated blood loss: none. Procedure:                Pre-Anesthesia Assessment:                           - Prior to the procedure, a History and Physical                            was performed, and patient medications and                            allergies were reviewed. The patient's tolerance of                            previous anesthesia was also reviewed. The risks                            and benefits of the procedure and the sedation                            options and risks were discussed with the patient.                            All questions were answered, and informed consent                            was obtained. Prior Anticoagulants: The patient has                            taken  aspirin, last dose was 1 day prior to                            procedure. ASA Grade Assessment: II - A patient                            with mild systemic disease. After reviewing the                            risks and benefits, the patient was deemed in                            satisfactory condition to undergo the procedure.                           After  obtaining informed consent, the endoscope was                            passed under direct vision. Throughout the                            procedure, the patient's blood pressure, pulse, and                            oxygen saturations were monitored continuously. The                            [id number] ([id number]) Olympus EGD Therapeutic was                            introduced through the mouth, and advanced to the                            second part of duodenum. The [id number] ([id number])                            Olympus Radial EUS was introduced through the                            mouth, and advanced to the duodenum for ultrasound                            examination from the stomach and duodenum. The                            upper EUS was accomplished without difficulty. The                            patient tolerated the procedure. Scope In: Scope Out: Findings:      ENDOSCOPIC FINDING: :      No gross lesions were noted in the proximal esophagus and in the mid       esophagus.      LA Grade A (one or more mucosal breaks  less than 5 mm, not extending       between tops of 2 mucosal folds) esophagitis with no bleeding was found       in the distal esophagus.      A small hiatal hernia was found. The proximal extent of the gastric       folds (end of tubular esophagus) was 37 cm from the incisors. The hiatal       narrowing was 36 cm from the incisors. The Z-line was 34 cm from the       incisors.      A single 15 mm semi-sessile polyp with no bleeding and no stigmata of       recent bleeding was found in the prepyloric region of the stomach moving       into the pylorus. There were 2 ulcerations on the polyp, likely a result       of recurrent prolapse of the lesion in/out of the pylorus. After EUS was       completed as below, preparations were made for mucosal resection. [place]       Orise Gel was injected to raise the lesion. Snare mucosal resection was        performed. Resection and retrieval were complete. To close the defect       after mucosal resection, five hemostatic clips were successfully placed       (MR conditional). There was no bleeding during, or at the end, of the       procedure.      No gross lesions were noted in the duodenal bulb, in the first portion       of the duodenum and in the second portion of the duodenum.      ENDOSONOGRAPHIC FINDING: :      A hypoechoic oval lesion was identified endosonographically in the       prepyloric region of the stomach. The mass measured 13 mm by 6 mm in       maximal cross-sectional diameter. The outer margins were smooth. There       was sonographic evidence suggesting invasion into the luminal       interface/superficial mucosa (Layer 1) and the deep mucosa (Layer 2).       There did not appear to be evidence of the lesion invading into the       Muscularis.      Endosonographic imaging in the cardia of the stomach, in the body of the       stomach and in the antrum of the stomach showed no wall thickening.      Endosonographic imaging in the duodenal bulb showed no wall thickening.      Endosonographic imaging in the visualized portion of the liver showed no       mass.      No malignant-appearing lymph nodes were visualized in the left gastric       region (level 17), gastrohepatic ligament (level 18), celiac region       (level 20) and perigastric region.      The celiac region was visualized. Impression:               EGD Impression:                           - No gross lesions in esophagus.                           -  LA Grade A esophagitis.                           - Small hiatal hernia.                           - A single gastric polyp, previoulsy identified as                            adenoma. After EUS was performed, this lesion was                            resected and retrieved via mucosal resection. Clips                            (MR conditional) were placed to  close the defect.                           - No gross lesions in the duodenal bulb, in the                            first portion of the duodenum and in the second                            portion of the duodenum.                           EUS Impression:                           - A lesion was found in the prepyloric region of                            the stomach. A tissue diagnosis was obtained prior                            to this exam. This is consistent with an adenoma                            without ultrasound evidence concerning for deeper                            invasion.                           - No malignant-appearing lymph nodes were                            visualized in the left gastric region (level 17),                            gastrohepatic ligament (level 18), celiac region                            (level  20) and perigastric region.                           - No gross lesions in esophagus.                           - LA Grade A esophagitis. Moderate Sedation:      Not Applicable - Patient had care per Anesthesia. Recommendation:           - The patient will be observed post-procedure,                            until all discharge criteria are met.                           - Discharge patient to home.                           - Soft-pureed and full liquids 24 hours and then                            may advance to softer foods thereafter.                           - Await path results.                           - Increase PPI to 40 mg BID for next 1-2 months to                            aid in mucosal healing.                           - Observe patient's clinical course. Monitor for                            signs/symptoms of bleeding/perforation.                           - Repeat the upper endoscopy in 2-3 months for                            surveillance of region - based on final pathology.                           - The findings  and recommendations were discussed                            with the patient.                           - The findings and recommendations were discussed                            with the patient's family. Procedure Code(s):        ---  Professional ---                           [phone], Esophagogastroduodenoscopy, flexible,                            transoral; with endoscopic mucosal resection                           43237, Esophagogastroduodenoscopy, flexible,                            transoral; with endoscopic ultrasound examination                            limited to the esophagus, stomach or duodenum, and                            adjacent structures Diagnosis Code(s):        --- Professional ---                           K20.9, Esophagitis, unspecified                           K31.7, Polyp of stomach and duodenum                           I89.9, Noninfective disorder of lymphatic vessels                            and lymph nodes, unspecified                           K31.89, Other diseases of stomach and duodenum CPT copyright 2018 American Medical Association. All rights reserved. The codes documented in this report are preliminary and upon coder review may  be revised to meet current compliance requirements. Justice Britain, MD 02/12/2018 11:03:21 AM Number of Addenda: 0

## 2018-02-12 NOTE — Discharge Instructions (Signed)
YOU HAD AN ENDOSCOPIC PROCEDURE TODAY: Refer to the procedure report and other information in the discharge instructions given to you for any specific questions about what was found during the examination. If this information does not answer your questions, please call [place] office at [phone] to clarify.  ° °YOU SHOULD EXPECT: Some feelings of bloating in the abdomen. Passage of more gas than usual. Walking can help get rid of the air that was put into your GI tract during the procedure and reduce the bloating. If you had a lower endoscopy (such as a colonoscopy or flexible sigmoidoscopy) you may notice spotting of blood in your stool or on the toilet paper. Some abdominal soreness may be present for a day or two, also. ° °DIET: Your first meal following the procedure should be a light meal and then it is ok to progress to your normal diet. A half-sandwich or bowl of soup is an example of a good first meal. Heavy or fried foods are harder to digest and may make you feel nauseous or bloated. Drink plenty of fluids but you should avoid alcoholic beverages for 24 hours. If you had a esophageal dilation, please see attached instructions for diet.   ° °ACTIVITY: Your care partner should take you home directly after the procedure. You should plan to take it easy, moving slowly for the rest of the day. You can resume normal activity the day after the procedure however YOU SHOULD NOT DRIVE, use power tools, machinery or perform tasks that involve climbing or major physical exertion for 24 hours (because of the sedation medicines used during the test).  ° °SYMPTOMS TO REPORT IMMEDIATELY: °A gastroenterologist can be reached at any hour. Please call [phone]  for any of the following symptoms:  °Following lower endoscopy (colonoscopy, flexible sigmoidoscopy) °Excessive amounts of blood in the stool  °Significant tenderness, worsening of abdominal pains  °Swelling of the abdomen that is new, acute  °Fever of 100° or  higher  °Following upper endoscopy (EGD, EUS, ERCP, esophageal dilation) °Vomiting of blood or coffee ground material  °New, significant abdominal pain  °New, significant chest pain or pain under the shoulder blades  °Painful or persistently difficult swallowing  °New shortness of breath  °Black, tarry-looking or red, bloody stools ° °FOLLOW UP:  °If any biopsies were taken you will be contacted by phone or by letter within the next 1-3 weeks. Call [phone]  if you have not heard about the biopsies in 3 weeks.  °Please also call with any specific questions about appointments or follow up tests. ° °

## 2018-02-12 NOTE — Anesthesia Procedure Notes (Signed)
Procedure Name: MAC Date/Time: 02/12/2018 8:51 AM Performed by: Niel Hummer, CRNA Pre-anesthesia Checklist: Patient identified, Emergency Drugs available, Suction available and Patient being monitored Patient Re-evaluated:Patient Re-evaluated prior to induction Oxygen Delivery Method: Nasal cannula

## 2018-02-13 ENCOUNTER — Encounter (HOSPITAL_COMMUNITY): Payer: Self-pay | Admitting: Gastroenterology

## 2018-02-13 ENCOUNTER — Encounter: Payer: Self-pay | Admitting: Gastroenterology

## 2018-03-09 ENCOUNTER — Other Ambulatory Visit: Payer: Self-pay | Admitting: Cardiology

## 2018-03-10 ENCOUNTER — Telehealth: Payer: Self-pay | Admitting: Gastroenterology

## 2018-03-10 ENCOUNTER — Other Ambulatory Visit (HOSPITAL_COMMUNITY): Payer: Self-pay | Admitting: Gastroenterology

## 2018-03-10 NOTE — Telephone Encounter (Signed)
[facility] calling regarding rf for omeprazole, pt states that she takes 1 BID. However, refill order is for once a day. Pls call to clarify. They will take a verbal order since their fax is not working.

## 2018-03-10 NOTE — Telephone Encounter (Signed)
Rx request sent to pharmacy.  

## 2018-03-10 NOTE — Telephone Encounter (Signed)
Spoke to pharmacist. Per Dr Rush Landmark 02/12/18 note patient may take Omeprazole 40mg  BID for 1-2 months. Refills sent to go through March

## 2018-03-11 ENCOUNTER — Telehealth: Payer: Self-pay | Admitting: Internal Medicine

## 2018-03-11 NOTE — Telephone Encounter (Signed)
Spoke with latoya at [facility] and let her know it was once a day

## 2018-03-11 NOTE — Telephone Encounter (Signed)
Copied from [place] [id number]. Topic: Quick Communication - Rx Refill/Question >> Mar 11, 2018 12:06 PM Rayann Heman wrote: Medication:Insulin Pen Needle (B-D UF III MINI PEN NEEDLES) 31G X 5 MM MISC [[id number]]  latoya calling from [facility] called and stated that direction need to states how many times a day she uses the needles. Please advise

## 2018-04-10 ENCOUNTER — Other Ambulatory Visit: Payer: Self-pay | Admitting: Internal Medicine

## 2018-04-11 ENCOUNTER — Other Ambulatory Visit: Payer: Self-pay

## 2018-04-11 MED ORDER — LIRAGLUTIDE 18 MG/3ML ~~LOC~~ SOPN: PEN_INJECTOR | SUBCUTANEOUS | 1 refills | Status: DC

## 2018-04-11 MED ORDER — METFORMIN HCL ER 500 MG PO TB24: 1000.0000 mg | ORAL_TABLET | Freq: Two times a day (BID) | ORAL | 1 refills | Status: DC

## 2018-05-06 DIAGNOSIS — L700 Acne vulgaris: Secondary | ICD-10-CM | POA: Diagnosis not present

## 2018-05-06 DIAGNOSIS — L304 Erythema intertrigo: Secondary | ICD-10-CM | POA: Diagnosis not present

## 2018-05-14 ENCOUNTER — Other Ambulatory Visit: Payer: Self-pay | Admitting: Internal Medicine

## 2018-05-22 ENCOUNTER — Telehealth: Payer: Self-pay

## 2018-05-22 MED ORDER — OMEPRAZOLE 40 MG PO CPDR: 40.0000 mg | DELAYED_RELEASE_CAPSULE | Freq: Two times a day (BID) | ORAL | 0 refills | Status: DC

## 2018-05-22 NOTE — Telephone Encounter (Signed)
-----   Message from Irving Copas., MD sent at 05/22/2018 12:32 PM EDT ----- Regarding: Follow-up Raechelle Sarti,I reply to this patient's my chart message.Please transition her to omeprazole 40 mg once daily instead of twice daily.  She will need refills.  Please give her 3 months worth of refills.  [facility].I am okay with the patient's procedure being scheduled within the next 3 months so for a total of 6 months from her initial procedure back in January.Please put that in our follow-up list for patients to have repeat procedures.She will be a EGD with EMR 90-minute slot.Veena, she was very appreciative for all the care she is received through our [place] group so wanted to let you know as well. Thanks.GM

## 2018-06-16 ENCOUNTER — Telehealth: Payer: Self-pay

## 2018-06-16 NOTE — Telephone Encounter (Signed)
Lvm returning pt call about virtual visit as requested by pt

## 2018-06-20 ENCOUNTER — Ambulatory Visit (INDEPENDENT_AMBULATORY_CARE_PROVIDER_SITE_OTHER): Payer: PPO | Admitting: Internal Medicine

## 2018-06-20 ENCOUNTER — Other Ambulatory Visit: Payer: Self-pay

## 2018-06-20 ENCOUNTER — Other Ambulatory Visit: Payer: Self-pay | Admitting: Internal Medicine

## 2018-06-20 ENCOUNTER — Encounter: Payer: Self-pay | Admitting: Internal Medicine

## 2018-06-20 ENCOUNTER — Encounter: Payer: PPO | Admitting: Internal Medicine

## 2018-06-20 ENCOUNTER — Other Ambulatory Visit: Payer: Self-pay | Admitting: Cardiology

## 2018-06-20 ENCOUNTER — Telehealth: Payer: Self-pay | Admitting: Internal Medicine

## 2018-06-20 DIAGNOSIS — E119 Type 2 diabetes mellitus without complications: Secondary | ICD-10-CM

## 2018-06-20 DIAGNOSIS — Z79899 Other long term (current) drug therapy: Secondary | ICD-10-CM | POA: Diagnosis not present

## 2018-06-20 DIAGNOSIS — E785 Hyperlipidemia, unspecified: Secondary | ICD-10-CM | POA: Diagnosis not present

## 2018-06-20 DIAGNOSIS — I2510 Atherosclerotic heart disease of native coronary artery without angina pectoris: Secondary | ICD-10-CM

## 2018-06-20 DIAGNOSIS — E611 Iron deficiency: Secondary | ICD-10-CM

## 2018-06-20 MED ORDER — LIRAGLUTIDE 18 MG/3ML ~~LOC~~ SOPN: PEN_INJECTOR | SUBCUTANEOUS | 1 refills | Status: DC

## 2018-06-20 MED ORDER — CANAGLIFLOZIN 300 MG PO TABS: ORAL_TABLET | ORAL | 1 refills | Status: DC

## 2018-06-20 MED ORDER — ONETOUCH DELICA PLUS LANCET33G MISC: 100.0000 | 2 refills | Status: DC

## 2018-06-20 MED ORDER — INSULIN PEN NEEDLE 31G X 5 MM MISC: 3 refills | Status: DC

## 2018-06-20 MED ORDER — METFORMIN HCL ER 500 MG PO TB24: 1000.0000 mg | ORAL_TABLET | Freq: Two times a day (BID) | ORAL | 1 refills | Status: DC

## 2018-06-20 MED ORDER — GLUCOSE BLOOD VI STRP: ORAL_STRIP | 12 refills | Status: DC

## 2018-06-20 NOTE — Progress Notes (Signed)
Virtual Visit via Video Note  I connected with@ on 06/20/18 at  8:45 AM EDT by a video enabled telemedicine application and verified that I am speaking with the correct person using two identifiers. Location patient: home Location provider:work  office Persons participating in the virtual visit: patient, provider  [place] national recommendations  regarding COVID 19 pandemic   video visit is advised over in office visit for this patient.  Patient aware  of the limitations of evaluation and management by telemedicine and  availability of in person appointments. and agreed to proceed.   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  Past Medical History:  Diagnosis Date  . Anemia    as a child   . CAD, NATIVE VESSEL 05/13/2009  . DIABETES MELLITUS, TYPE II 12/09/2009  . Gastric ulcer   . Gastritis   . GERD (gastroesophageal reflux disease)   . Hx of abnormal cervical Pap smear    cryo  but normal since then   . HYPERLIPIDEMIA-MIXED 04/14/2008  . Intestinal metaplasia of gastric mucosa 2012  . Myocardial infarction ([place])    2003  . Obesity   . Occult blood in stools   . Osteopenia   . OVERWEIGHT/OBESITY 05/13/2009  . Ulcer    Duodenal ulcer by x-ray 40 years ago  . Varicose veins    under rx [facility] 2016    Past Surgical History:  Procedure Laterality Date  . CESAREAN  SECTION     x2  . CLOSED REDUCTION PROXIMAL TIBIOFIBULAR JOINT [place]  2004   left  . COLONOSCOPY    . DILATION AND CURETTAGE OF UTERUS    . ECTOPIC PREGNANCY SURGERY    . ESOPHAGOGASTRODUODENOSCOPY N/A 02/12/2018   Procedure: ESOPHAGOGASTRODUODENOSCOPY (EGD);  Surgeon: Irving Copas., MD;  Location: [facility];  Service: Gastroenterology;  Laterality: N/A;  . EUS N/A 02/12/2018   Procedure: UPPER ENDOSCOPIC ULTRASOUND (EUS) RADIAL;  Surgeon: Rush Landmark Telford Nab., MD;  Location: [facility];  Service: Gastroenterology;  Laterality: N/A;  . FIBULA FRACTURE SURGERY    . GANGLION CYST EXCISION    . POLYPECTOMY  02/12/2018   Procedure: POLYPECTOMY;  Surgeon: Mansouraty, Telford Nab., MD;  Location: [facility];  Service: Gastroenterology;;  . TONSILLECTOMY    . TUBAL LIGATION     One Tube  . UPPER GASTROINTESTINAL ENDOSCOPY     08/27/17    Family History  Problem Relation Age of Onset  . Emphysema Mother        Smoker  . Heart attack Mother        due to head injury  . Alcohol abuse Mother   . Heart disease Mother   . Early death Mother   . Varicose Veins Mother   .  AAA (abdominal aortic aneurysm) Mother   . Lung cancer Father   . Heart attack Father   . Hypertension Father   . Alcohol abuse Father   . Heart disease Father        before age 73  . Aneurysm Father   . Hyperlipidemia Father   . Alcohol abuse Brother   . Diabetes Brother   . Heart disease Brother   . Heart attack Brother   . Diabetes Brother   . Aneurysm Paternal Grandmother   . Diabetes Other        grandfather  . Sudden Cardiac Death Brother   . Colon cancer Neg Hx   . Esophageal cancer Neg Hx   . Stomach cancer Neg Hx   . Inflammatory bowel disease Neg Hx   . Liver disease Neg Hx   . Pancreatic cancer Neg Hx   . Rectal cancer Neg Hx     Social History   Tobacco Use  . Smoking status: Former Smoker    Last attempt to quit: 02/05/1994    Years since quitting: 24.3  . Smokeless  tobacco: Never Used  Substance Use Topics  . Alcohol use: No    Alcohol/week: 0.0 standard drinks  . Drug use: No      Current Outpatient Medications:  .  aspirin 81 MG tablet, Take 81 mg by mouth daily.  , Disp: , Rfl:  .  atorvastatin (LIPITOR) 80 MG tablet, TAKE 1 TABLET BY MOUTH EVERY DAY AT 6 PM, Disp: 90 tablet, Rfl: 1 .  canagliflozin (INVOKANA) 300 MG TABS tablet, TAKE 1 TABLET BY MOUTH EVERY DAY BEFORE BREAKFAST, Disp: 90 tablet, Rfl: 1 .  clindamycin (CLEOCIN) 150 MG capsule, Take by mouth. 4 tabs prior to dental work , Disp: , Rfl:  .  Cyanocobalamin (B-12 PO), Take by mouth., Disp: , Rfl:  .  ezetimibe (ZETIA) 10 MG tablet, TAKE 1 TABLET BY MOUTH EVERY DAY, Disp: 90 tablet, Rfl: 0 .  Ferrous Sulfate (IRON) 325 (65 Fe) MG TABS, Take 1 tablet by mouth daily., Disp: , Rfl:  .  glucose blood (ONE TOUCH ULTRA TEST) test strip, One Touch Ultra:  Use as instructed, Disp: 100 each, Rfl: 12 .  Insulin Pen Needle (B-D UF III MINI PEN NEEDLES) 31G X 5 MM MISC, USE AS DIRECTED., Disp: 300 each, Rfl: 3 .  Lancet Devices (ONE TOUCH DELICA LANCING DEV) MISC, Use as directed to check blood sugars twice to four time per day. Dx E11.9, Disp: 1 each, Rfl: 11 .  Lancets (ONETOUCH DELICA PLUS [id number]) MISC, 100 Product by Other route See admin instructions. Check twice daily, Disp: 100 each, Rfl: 2 .  liraglutide (VICTOZA) 18 MG/3ML SOPN, INJECT 1.8 MG UNDER THE SKIN DAILY, Disp: 9 mL, Rfl: 1 .  metFORMIN (GLUCOPHAGE-XR) 500 MG 24 hr tablet, Take 2 tablets (1,000 mg total) by mouth 2 (two) times daily with a meal., Disp: 360 tablet, Rfl: 1 .  Multiple Vitamins-Minerals (MULTIVITAMIN PO), Take by mouth daily., Disp: , Rfl:  .  omeprazole (PRILOSEC) 40 MG capsule, Take 1 capsule (40 mg total) by mouth 2 (two) times daily., Disp: 180 capsule, Rfl: 0  EXAM: BP Readings from Last 3 Encounters:  02/12/18 124/73  01/06/18 96/78  12/27/17 136/82    VITALS per patient if applicable:  GENERAL:  alert, oriented, appears well and in no acute distress  HEENT: atraumatic, conjunttiva clear, no obvious abnormalities on inspection of external nose and ears  NECK: normal movements  of the head and neck  LUNGS: on inspection no signs of respiratory distress, breathing rate appears normal, no obvious gross SOB, gasping or wheezing  CV: no obvious cyanosis  MS: moves all visible extremities without noticeable abnormality  PSYCH/NEURO: pleasant and cooperative, no obvious depression or anxiety, speech and thought processing grossly intact Lab Results  Component Value Date   WBC 6.9 01/13/2018   HGB 14.7 01/13/2018   HCT 42.8 01/13/2018   PLT 245.0 01/13/2018   GLUCOSE 218 (H) 01/13/2018   CHOL 136 09/20/2017   TRIG 71.0 09/20/2017   HDL 43.00 09/20/2017   LDLCALC 79 09/20/2017   ALT 24 09/20/2017   AST 17 09/20/2017   NA 137 01/13/2018   K 4.3 01/13/2018   CL 99 01/13/2018   CREATININE 0.67 01/13/2018   BUN 10 01/13/2018   CO2 25 01/13/2018   TSH 2.84 09/20/2017   INR 0.9 01/13/2018   HGBA1C 7.3 (H) 09/20/2017   MICROALBUR 3.9 (H) 09/20/2017    ASSESSMENT AND PLAN:  Discussed the following assessment and plan:  Medication management - Plan: Basic metabolic panel, CBC with Differential/Platelet, Hemoglobin A1c, Hepatic function panel, Lipid panel, Microalbumin / creatinine urine ratio  Diabetes mellitus without complication ([place]) - Plan: Basic metabolic panel, CBC with Differential/Platelet, Hemoglobin A1c, Hepatic function panel, Lipid panel, Microalbumin / creatinine urine ratio  Hyperlipidemia, unspecified hyperlipidemia type - Plan: Basic metabolic panel, CBC with Differential/Platelet, Hemoglobin A1c, Hepatic function panel, Lipid panel, Microalbumin / creatinine urine ratio  Iron deficiency - Plan: Basic metabolic panel, CBC with Differential/Platelet, Hemoglobin A1c, Hepatic function panel, Lipid panel, Microalbumin / creatinine urine ratio  Atherosclerosis of  native coronary artery of native heart without angina pectoris - Plan: Basic metabolic panel, CBC with Differential/Platelet, Hemoglobin A1c, Hepatic function panel, Lipid panel, Microalbumin / creatinine urine ratio Plan fasting  lab prefers  Early am June 26  appt to fit in her schedule  And then  Keep  cpx appt   For August  .  Will refill meds  For now.  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  About   Risk benefit of  In person  Visits and the unknown  At this time  . Total visit 42mins > 50% spent counseling and coordinating care as indicated in above note and in instructions to patient .      Expectant management and discussion of plan and treatment with opportunity to ask questions and all were answered. The patient agreed with the plan and demonstrated an understanding of the instructions.   Advised to call back or seek an in-person evaluation if  having  further concerns . Before lab and next visit   Shanon Ace, MD

## 2018-06-20 NOTE — Telephone Encounter (Signed)
Copied from [place] [id number]. Topic: Quick Communication - Rx Refill/Question >> Jun 20, 2018  2:52 PM Sheran Luz wrote: Medication: Insulin Pen Needle (B-D UF III MINI PEN NEEDLES) 31G X 5 MM MISC and glucose blood (ONE TOUCH ULTRA TEST) test strip [  LaToya with [facility] calling to request clarification on these supplies. She states in order to [employer], the RX needs to include frequency.

## 2018-06-24 ENCOUNTER — Telehealth: Payer: Self-pay | Admitting: *Deleted

## 2018-06-24 MED ORDER — GLUCOSE BLOOD VI STRP: ORAL_STRIP | 12 refills | Status: DC

## 2018-06-24 MED ORDER — INSULIN PEN NEEDLE 31G X 5 MM MISC: 3 refills | Status: DC

## 2018-06-24 NOTE — Telephone Encounter (Signed)
Rx has been corrected. Nothing further needed.

## 2018-06-24 NOTE — Telephone Encounter (Signed)
Copied from [place] [id number]. Topic: General - Inquiry >> Jun 24, 2018  9:14 AM Mathis Bud wrote: Reason for CRM: [facility] called stating they need the frequency for the two prescibtions that PCP prescribed on 5/15 due to insurance purposes.  Lancets (ONETOUCH DELICA PLUS [id number]) Insulin Pen Needle (B-D UF III MINI PEN NEEDLES) Pharmacy call back # [phone]

## 2018-06-24 NOTE — Addendum Note (Signed)
Addended by: Rebecca Eaton on: 06/24/2018 01:46 PM   Modules accepted: Orders

## 2018-06-24 NOTE — Telephone Encounter (Signed)
Check bg  2-3 x per day please update RX

## 2018-06-24 NOTE — Telephone Encounter (Signed)
Please advise. How often should the patient test? 

## 2018-07-10 ENCOUNTER — Telehealth: Payer: Self-pay

## 2018-07-10 ENCOUNTER — Other Ambulatory Visit: Payer: Self-pay

## 2018-07-10 DIAGNOSIS — K3189 Other diseases of stomach and duodenum: Secondary | ICD-10-CM

## 2018-07-10 NOTE — Telephone Encounter (Signed)
Error on the date of COVID testing from 6/26 to 6/25.  Will make pt aware and send another message in My Chart to make pt aware

## 2018-07-10 NOTE — Telephone Encounter (Signed)
[facility] 6/29 EGD Mapping at 2 pm and COVID testing on 6/26

## 2018-07-10 NOTE — Telephone Encounter (Signed)
Left message on machine to call back  

## 2018-07-10 NOTE — Telephone Encounter (Signed)
The pt left a voice mail message that she is unable to keep appt for 6/29.  I have rescheduled her to 7/6 and sent her a My Chart message with that information. As well as COVID testing on 7/2

## 2018-07-13 ENCOUNTER — Other Ambulatory Visit: Payer: Self-pay

## 2018-07-13 ENCOUNTER — Ambulatory Visit (HOSPITAL_COMMUNITY)
Admission: EM | Admit: 2018-07-13 | Discharge: 2018-07-13 | Disposition: A | Discharge status: Home / Self Care | Payer: PPO | Attending: Family Medicine | Admitting: Family Medicine

## 2018-07-13 ENCOUNTER — Encounter (HOSPITAL_COMMUNITY): Payer: Self-pay

## 2018-07-13 DIAGNOSIS — N3000 Acute cystitis without hematuria: Secondary | ICD-10-CM | POA: Diagnosis not present

## 2018-07-13 LAB — POCT URINALYSIS DIP (DEVICE)
Bilirubin Urine: NEGATIVE
Glucose, UA: >=1000 mg/dL — AB
Ketones, ur: NEGATIVE mg/dL
Nitrite: NEGATIVE
Protein, ur: 30 mg/dL — AB
Specific Gravity, Urine: 1.01 (ref 1.005–1.030)
Urobilinogen, UA: 0.2 mg/dL (ref 0.0–1.0)
pH: 6.5 (ref 5.0–8.0)

## 2018-07-13 MED ORDER — NITROFURANTOIN MONOHYD MACRO 100 MG PO CAPS: 100.0000 mg | ORAL_CAPSULE | Freq: Two times a day (BID) | ORAL | 0 refills | Status: DC

## 2018-07-13 NOTE — Discharge Instructions (Addendum)
Continue to push fluids Take the macrobid 2 x a day Take 2 doses today We did lab testing during this visit.  If there are any abnormal findings that require change in medicine or indicate a positive result, you will be notified.  If all of your tests are normal, you will not be called.

## 2018-07-13 NOTE — ED Triage Notes (Signed)
Pt states she has a UTI pt states she gets these a lot. Pt states she tried the cranberry pills.

## 2018-07-13 NOTE — ED Provider Notes (Signed)
[facility]    CSN: [id number] Arrival date & time: 07/13/18  1006     History   Chief Complaint Chief Complaint  Patient presents with  . Appointment    1020  . Urinary Tract Infection    HPI Morgan [name] is a 68 y.o. female.   HPI  Patient is a well-controlled diabetic.  She states she often has urinary frequency.  She also has had a number of urinary tract infections.  Always cystitis.  No kidney involvement.  She has not had one for about a year.  This episode started Friday, 2 days ago.  Burning with urination, increased frequency, cloudiness of urine.  She started pushing fluids and taking cranberry pills.  This morning the symptoms were worse so she is here for evaluation.  No fever or chills.  No nausea vomiting diarrhea.  No abdominal or flank pain  Past Medical History:  Diagnosis Date  . Anemia    as a child   . CAD, NATIVE VESSEL 05/13/2009  . DIABETES MELLITUS, TYPE II 12/09/2009  . Gastric ulcer   . Gastritis   . GERD (gastroesophageal reflux disease)   . Hx of abnormal cervical Pap smear    cryo  but normal since then   . HYPERLIPIDEMIA-MIXED 04/14/2008  . Intestinal metaplasia of gastric mucosa 2012  . Myocardial infarction ([place])    2003  . Obesity   . Occult blood in stools   . Osteopenia   . OVERWEIGHT/OBESITY 05/13/2009  . Ulcer    Duodenal ulcer by x-ray 40 years ago  . Varicose veins    under rx [facility] 2016    Patient Active Problem List   Diagnosis Date Noted  . 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 Gastric polyp 01/10/2018  . Abnormal findings on esophagogastroduodenoscopy (EGD) 01/10/2018  . Iron deficiency 02/26/2017  . Diabetes mellitus without complication ([place]) 47/82/9562  . Breast symptom left 11/17/2013  . Breast discharge left 11/17/2013  . Recurrent UTI 09/25/2013  . Varicose veins of lower extremities with other complications 13/09/6576  . Medication management 06/20/2012  . First degree AV block 01/17/2012  .  Dermatophytosis of foot 11/30/2011  . 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 Polyp at cervical os 04/14/2010  . GERD without esophagitis 03/06/2010  . Adult acne 03/06/2010  . GERD 03/06/2010  . Other acne 03/06/2010  . BLOOD IN STOOL 02/24/2010  . DUODENAL ULCER, HX OF 02/24/2010  . DIABETES MELLITUS, TYPE II 12/09/2009  . Overweight 05/13/2009  . CAD, NATIVE VESSEL 05/13/2009  . HLD (hyperlipidemia) 04/14/2008    Past Surgical History:  Procedure Laterality Date  . CESAREAN SECTION     x2  . CLOSED REDUCTION PROXIMAL TIBIOFIBULAR JOINT [place]  2004   left  . COLONOSCOPY    . DILATION AND CURETTAGE OF UTERUS    . ECTOPIC PREGNANCY SURGERY    . ESOPHAGOGASTRODUODENOSCOPY N/A 02/12/2018   Procedure: ESOPHAGOGASTRODUODENOSCOPY (EGD);  Surgeon: Irving Copas., MD;  Location: [facility];  Service: Gastroenterology;  Laterality: N/A;  . EUS N/A 02/12/2018   Procedure: UPPER ENDOSCOPIC ULTRASOUND (EUS) RADIAL;  Surgeon: Rush Landmark Telford Nab., MD;  Location: [facility];  Service: Gastroenterology;  Laterality: N/A;  . FIBULA FRACTURE SURGERY    . GANGLION CYST EXCISION    . POLYPECTOMY  02/12/2018   Procedure: POLYPECTOMY;  Surgeon: Mansouraty, Telford Nab., MD;  Location: [facility];  Service: Gastroenterology;;  . TONSILLECTOMY    . TUBAL LIGATION  One Tube  . UPPER GASTROINTESTINAL ENDOSCOPY     08/27/17    OB History    Gravida  5   Para  2   Term      Preterm      AB  3   Living        SAB  1   TAB      Ectopic  2   Multiple      Live Births           Obstetric Comments  c section and  salpingectomy oophorectomy         Home Medications    Prior to Admission medications   Medication Sig Start Date End Date Taking? Authorizing Provider  aspirin 81 MG tablet Take 81 mg by mouth daily.      [provider]  atorvastatin (LIPITOR) 80 MG tablet TAKE 1 TABLET BY MOUTH EVERY DAY AT 6 pm 06/23/18    Panosh, Standley Brooking, MD  canagliflozin (INVOKANA) 300 MG TABS tablet TAKE 1 TABLET BY MOUTH EVERY DAY BEFORE BREAKFAST 06/20/18   Panosh, Standley Brooking, MD  Cyanocobalamin (B-12 PO) Take by mouth.    [provider]  ezetimibe (ZETIA) 10 MG tablet TAKE 1 TABLET BY MOUTH EVERY DAY 06/20/18   Lelon Perla, MD  Ferrous Sulfate (IRON) 325 (65 Fe) MG TABS Take 1 tablet by mouth daily.    [provider]  glucose blood (ONE TOUCH ULTRA TEST) test strip One Touch Ultra:  Test blood glucose 2-3 times a day. 06/24/18   Panosh, Standley Brooking, MD  Insulin Pen Needle (B-D UF III MINI PEN NEEDLES) 31G X 5 MM MISC Inject 1.8mg  under the skin daily. 06/24/18   Panosh, Standley Brooking, MD  Lancet Devices (ONE TOUCH DELICA LANCING DEV) MISC Use as directed to check blood sugars twice to four time per day. Dx E11.9 10/25/17   Panosh, Standley Brooking, MD  Lancets [facility] DELICA PLUS [id number]) [place] 100 Product by Other route See admin instructions. Check twice daily 06/20/18   Panosh, Standley Brooking, MD  liraglutide (VICTOZA) 18 MG/3ML SOPN INJECT 1.8 MG UNDER THE SKIN DAILY 06/20/18   Panosh, Standley Brooking, MD  metFORMIN (GLUCOPHAGE-XR) 500 MG 24 hr tablet Take 2 tablets (1,000 mg total) by mouth 2 (two) times daily with a meal. 06/20/18   Panosh, Standley Brooking, MD  Multiple Vitamins-Minerals (MULTIVITAMIN PO) Take by mouth daily.    [provider]  nitrofurantoin, macrocrystal-monohydrate, (MACROBID) 100 MG capsule Take 1 capsule (100 mg total) by mouth 2 (two) times daily. 07/13/18   Raylene Everts, MD  omeprazole (PRILOSEC) 40 MG capsule Take 1 capsule (40 mg total) by mouth 2 (two) times daily. 05/22/18 08/20/18  Mansouraty, Telford Nab., MD    Family History Family History  Problem Relation Age of Onset  . Emphysema Mother        Smoker  . Heart attack Mother        due to head injury  . Alcohol abuse Mother   . Heart disease Mother   . Early death Mother   . Varicose Veins Mother   . AAA (abdominal aortic aneurysm) Mother    . Lung cancer Father   . Heart attack Father   . Hypertension Father   . Alcohol abuse Father   . Heart disease Father        before age 88  . Aneurysm Father   . Hyperlipidemia Father   . Alcohol abuse Brother   .  Diabetes Brother   . Heart disease Brother   . Heart attack Brother   . Diabetes Brother   . Aneurysm Paternal Grandmother   . Diabetes Other        grandfather  . Sudden Cardiac Death Brother   . Colon cancer Neg Hx   . Esophageal cancer Neg Hx   . Stomach cancer Neg Hx   . Inflammatory bowel disease Neg Hx   . Liver disease Neg Hx   . Pancreatic cancer Neg Hx   . Rectal cancer Neg Hx     Social History Social History   Tobacco Use  . Smoking status: Former Smoker    Last attempt to quit: 02/05/1994    Years since quitting: 24.4  . Smokeless tobacco: Never Used  Substance Use Topics  . Alcohol use: No    Alcohol/week: 0.0 standard drinks  . Drug use: No     Allergies   Penicillins   Review of Systems Review of Systems  Constitutional: Negative for chills and fever.  HENT: Negative for ear pain and sore throat.   Eyes: Negative for pain and visual disturbance.  Respiratory: Negative for cough and shortness of breath.   Cardiovascular: Negative for chest pain and palpitations.  Gastrointestinal: Negative for abdominal pain and vomiting.  Genitourinary: Positive for dysuria and frequency. Negative for flank pain and hematuria.  Musculoskeletal: Negative for arthralgias and back pain.  Skin: Negative for color change and rash.  Neurological: Negative for seizures and syncope.  All other systems reviewed and are negative.    Physical Exam Triage Vital Signs ED Triage Vitals  Enc Vitals Group     BP 07/13/18 1059 126/73     Pulse Rate 07/13/18 1059 67     Resp 07/13/18 1059 16     Temp 07/13/18 1059 97.7 F (36.5 C)     Temp Source 07/13/18 1059 Oral     SpO2 07/13/18 1059 98 %     Weight 07/13/18 1038 160 lb (72.6 kg)     Height --       Head Circumference --      Peak Flow --      Pain Score 07/13/18 1038 0     Pain Loc --      Pain Edu? --      Excl. in [place]? --    No data found.  Updated Vital Signs BP 126/73 (BP Location: Right Arm)   Pulse 67   Temp 97.7 F (36.5 C) (Oral)   Resp 16   Wt 72.6 kg   SpO2 98%   BMI 28.34 kg/m   Physical Exam Constitutional:      General: She is not in acute distress.    Appearance: She is well-developed.  HENT:     Head: Normocephalic and atraumatic.  Eyes:     Conjunctiva/sclera: Conjunctivae normal.     Pupils: Pupils are equal, round, and reactive to light.  Neck:     Musculoskeletal: Normal range of motion.  Cardiovascular:     Rate and Rhythm: Normal rate.  Pulmonary:     Effort: Pulmonary effort is normal. No respiratory distress.  Abdominal:     General: There is no distension.     Palpations: Abdomen is soft.     Tenderness: There is no abdominal tenderness. There is no right CVA tenderness or left CVA tenderness.  Musculoskeletal: Normal range of motion.  Skin:    General: Skin is warm and dry.  Neurological:  Mental Status: She is alert.      UC Treatments / Results  Labs (all labs ordered are listed, but only abnormal results are displayed) Labs Reviewed  POCT URINALYSIS DIP (DEVICE) - Abnormal; Notable for the following components:      Result Value   Glucose, UA >=1000 (*)    Hgb urine dipstick LARGE (*)    Protein, ur 30 (*)    Leukocytes,Ua TRACE (*)    All other components within normal limits  URINE CULTURE    EKG None  Radiology No results found.  Procedures Procedures (including critical care time)  Medications Ordered in UC Medications - No data to display  Initial Impression / Assessment and Plan / UC Course  I have reviewed the triage vital signs and the nursing notes.  Pertinent labs & imaging results that were available during my care of the patient were reviewed by me and considered in my medical decision making  (see chart for details).  Clinical Course as of Jul 12 1104  Sun Jul 13, 2018  1055 POCT Urinalysis, Dipstick [YN]    Clinical Course User Index [YN] Raylene Everts, MD    Final Clinical Impressions(s) / UC Diagnoses   Final diagnoses:  Acute cystitis without hematuria     Discharge Instructions     Continue to push fluids Take the macrobid 2 x a day Take 2 doses today We did lab testing during this visit.  If there are any abnormal findings that require change in medicine or indicate a positive result, you will be notified.  If all of your tests are normal, you will not be called.      ED Prescriptions    Medication Sig Dispense Auth. Provider   nitrofurantoin, macrocrystal-monohydrate, (MACROBID) 100 MG capsule Take 1 capsule (100 mg total) by mouth 2 (two) times daily. 10 capsule Raylene Everts, MD     Controlled Substance Prescriptions [place] Controlled Substance Registry consulted? Not Applicable   Raylene Everts, MD 07/13/18 1108

## 2018-07-15 LAB — URINE CULTURE: Culture: 10000 — AB

## 2018-07-16 ENCOUNTER — Telehealth (HOSPITAL_COMMUNITY): Payer: Self-pay | Admitting: Emergency Medicine

## 2018-07-16 NOTE — Telephone Encounter (Signed)
Attempted to reach patient. No answer at this time.   

## 2018-07-22 ENCOUNTER — Other Ambulatory Visit: Payer: Self-pay | Admitting: Internal Medicine

## 2018-07-23 NOTE — Telephone Encounter (Signed)
Urine culture showed a resistant Ecoli but macrobid should have worked    I fyou think you have vaginal sx that is not usually a UTI  ? Any fever   ? Other ?  Can you do a virtual visit  Or at least drop off another UA and urine culture ? To help guid treatment   And a nother uti not then we would have to try cipro

## 2018-07-25 ENCOUNTER — Encounter: Payer: Self-pay | Admitting: Gastroenterology

## 2018-07-26 ENCOUNTER — Ambulatory Visit (HOSPITAL_COMMUNITY)
Admission: EM | Admit: 2018-07-26 | Discharge: 2018-07-26 | Disposition: A | Discharge status: Home / Self Care | Payer: PPO | Attending: Internal Medicine | Admitting: Internal Medicine

## 2018-07-26 ENCOUNTER — Encounter (HOSPITAL_COMMUNITY): Payer: Self-pay

## 2018-07-26 ENCOUNTER — Other Ambulatory Visit: Payer: Self-pay

## 2018-07-26 DIAGNOSIS — N390 Urinary tract infection, site not specified: Secondary | ICD-10-CM | POA: Diagnosis not present

## 2018-07-26 DIAGNOSIS — Z1612 Extended spectrum beta lactamase (ESBL) resistance: Secondary | ICD-10-CM | POA: Diagnosis not present

## 2018-07-26 DIAGNOSIS — E1169 Type 2 diabetes mellitus with other specified complication: Secondary | ICD-10-CM | POA: Insufficient documentation

## 2018-07-26 DIAGNOSIS — B9629 Other Escherichia coli [E. coli] as the cause of diseases classified elsewhere: Secondary | ICD-10-CM | POA: Diagnosis not present

## 2018-07-26 LAB — POCT URINALYSIS DIP (DEVICE)
Bilirubin Urine: NEGATIVE
Glucose, UA: 500 mg/dL — AB
Ketones, ur: NEGATIVE mg/dL
Nitrite: NEGATIVE
Protein, ur: NEGATIVE mg/dL
Specific Gravity, Urine: <=1.005 (ref 1.005–1.030)
Urobilinogen, UA: 0.2 mg/dL (ref 0.0–1.0)
pH: 5.5 (ref 5.0–8.0)

## 2018-07-26 LAB — CBC
HCT: 43.2 % (ref 36.0–46.0)
Hemoglobin: 14.6 g/dL (ref 12.0–15.0)
MCH: 31.1 pg (ref 26.0–34.0)
MCHC: 33.8 g/dL (ref 30.0–36.0)
MCV: 92.1 fL (ref 80.0–100.0)
Platelets: 208 10*3/uL (ref 150–400)
RBC: 4.69 MIL/uL (ref 3.87–5.11)
RDW: 12.8 % (ref 11.5–15.5)
WBC: 10.1 10*3/uL (ref 4.0–10.5)
nRBC: 0 % (ref 0.0–0.2)

## 2018-07-26 LAB — COMPREHENSIVE METABOLIC PANEL
ALT: 28 U/L (ref 0–44)
AST: 22 U/L (ref 15–41)
Albumin: 4 g/dL (ref 3.5–5.0)
Alkaline Phosphatase: 70 U/L (ref 38–126)
Anion gap: 12 (ref 5–15)
BUN: 14 mg/dL (ref 8–23)
CO2: 25 mmol/L (ref 22–32)
Calcium: 9.7 mg/dL (ref 8.9–10.3)
Chloride: 98 mmol/L (ref 98–111)
Creatinine, Ser: 0.78 mg/dL (ref 0.44–1.00)
GFR calc Af Amer: >60 mL/min (ref >60)
GFR calc non Af Amer: >60 mL/min (ref >60)
Glucose, Bld: 134 mg/dL — ABNORMAL HIGH (ref 70–99)
Potassium: 4.2 mmol/L (ref 3.5–5.1)
Sodium: 135 mmol/L (ref 135–145)
Total Bilirubin: 1.1 mg/dL (ref 0.3–1.2)
Total Protein: 6.5 g/dL (ref 6.5–8.1)

## 2018-07-26 MED ORDER — NITROFURANTOIN MONOHYD MACRO 100 MG PO CAPS: 100.0000 mg | ORAL_CAPSULE | Freq: Two times a day (BID) | ORAL | 0 refills | Status: DC

## 2018-07-26 NOTE — ED Triage Notes (Signed)
Pt states she has a UTI again. Pt has been a antibiotic and after the meds were done the UTI came back. Pt states she has vaginal dis comfort.

## 2018-07-26 NOTE — ED Provider Notes (Signed)
[place]    CSN: [id number] Arrival date & time: 07/26/18  1033      History   Chief Complaint Chief Complaint  Patient presents with  . Appointment    1050  . Urinary Tract Infection    HPI Morgan [name] is a 68 y.o. female.   HPI  Patient recently diagnosed and treated for a complicated UTI which urine culture grew ESBL. She was treated with nitrofurantoin and completed entire course.  She is a diabetic, A1C 7.3.  Treated with Invokana for diabetes management and has had chronic recurrent UTIs since starting medication back in 2015.  PCP last treated UTI with nitrofurantoin for 7 days and infection resolved for more than a year.  Of recent she has begun to have recurrent UTIs again. Endorses urine frequency, chills, dysuria, vaginal discomfort for at least 48 hours.  Denies any CVA tenderness or fever. Past Medical History:  Diagnosis Date  . Anemia    as a child   . CAD, NATIVE VESSEL 05/13/2009  . DIABETES MELLITUS, TYPE II 12/09/2009  . Gastric ulcer   . Gastritis   . GERD (gastroesophageal reflux disease)   . Hx of abnormal cervical Pap smear    cryo  but normal since then   . HYPERLIPIDEMIA-MIXED 04/14/2008  . Intestinal metaplasia of gastric mucosa 2012  . Myocardial infarction ([place])    2003  . Obesity   . Occult blood in stools   . Osteopenia   . OVERWEIGHT/OBESITY 05/13/2009  . Ulcer    Duodenal ulcer by x-ray 40 years ago  . Varicose veins    under rx [place] 2016    Patient Active Problem List   Diagnosis Date Noted  . 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 Gastric polyp 01/10/2018  . Abnormal findings on esophagogastroduodenoscopy (EGD) 01/10/2018  . Iron deficiency 02/26/2017  . Diabetes mellitus without complication ([place]) 07/62/2633  . Breast symptom left 11/17/2013  . Breast discharge left 11/17/2013  . Recurrent UTI 09/25/2013  . Varicose veins of lower extremities with other complications 35/45/6256  . Medication management 06/20/2012  .  First degree AV block 01/17/2012  . Dermatophytosis of foot 11/30/2011  . 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 Polyp at cervical os 04/14/2010  . GERD without esophagitis 03/06/2010  . Adult acne 03/06/2010  . GERD 03/06/2010  . Other acne 03/06/2010  . BLOOD IN STOOL 02/24/2010  . DUODENAL ULCER, HX OF 02/24/2010  . DIABETES MELLITUS, TYPE II 12/09/2009  . Overweight 05/13/2009  . CAD, NATIVE VESSEL 05/13/2009  . HLD (hyperlipidemia) 04/14/2008    Past Surgical History:  Procedure Laterality Date  . CESAREAN SECTION     x2  . CLOSED REDUCTION PROXIMAL TIBIOFIBULAR JOINT [place]  2004   left  . COLONOSCOPY    . DILATION AND CURETTAGE OF UTERUS    . ECTOPIC PREGNANCY SURGERY    . ESOPHAGOGASTRODUODENOSCOPY N/A 02/12/2018   Procedure: ESOPHAGOGASTRODUODENOSCOPY (EGD);  Surgeon: Irving Copas., MD;  Location: [facility];  Service: Gastroenterology;  Laterality: N/A;  . EUS N/A 02/12/2018   Procedure: UPPER ENDOSCOPIC ULTRASOUND (EUS) RADIAL;  Surgeon: Rush Landmark Telford Nab., MD;  Location: [facility];  Service: Gastroenterology;  Laterality: N/A;  . FIBULA FRACTURE SURGERY    . GANGLION CYST EXCISION    . POLYPECTOMY  02/12/2018   Procedure: POLYPECTOMY;  Surgeon: Mansouraty, Telford Nab., MD;  Location: [facility];  Service: Gastroenterology;;  . TONSILLECTOMY    . TUBAL LIGATION  One Tube  . UPPER GASTROINTESTINAL ENDOSCOPY     08/27/17    OB History    Gravida  5   Para  2   Term      Preterm      AB  3   Living        SAB  1   TAB      Ectopic  2   Multiple      Live Births           Obstetric Comments  c section and  salpingectomy oophorectomy         Home Medications    Prior to Admission medications   Medication Sig Start Date End Date Taking? Authorizing Provider  aspirin 81 MG tablet Take 81 mg by mouth daily.      [provider]  atorvastatin (LIPITOR) 80 MG tablet TAKE 1 TABLET  BY MOUTH EVERY DAY AT 6 pm 06/23/18   Panosh, Standley Brooking, MD  canagliflozin (INVOKANA) 300 MG TABS tablet TAKE 1 TABLET BY MOUTH EVERY DAY BEFORE BREAKFAST 06/20/18   Panosh, Standley Brooking, MD  Cyanocobalamin (B-12 PO) Take by mouth.    [provider]  ezetimibe (ZETIA) 10 MG tablet TAKE 1 TABLET BY MOUTH EVERY DAY 06/20/18   Lelon Perla, MD  Ferrous Sulfate (IRON) 325 (65 Fe) MG TABS Take 1 tablet by mouth daily.    [provider]  glucose blood (ONE TOUCH ULTRA TEST) test strip One Touch Ultra:  Test blood glucose 2-3 times a day. 06/24/18   Panosh, Standley Brooking, MD  Insulin Pen Needle (B-D UF III MINI PEN NEEDLES) 31G X 5 MM MISC Inject 1.8mg  under the skin daily. 06/24/18   Panosh, Standley Brooking, MD  Lancet Devices (ONE TOUCH DELICA LANCING DEV) MISC Use as directed to check blood sugars twice to four time per day. Dx E11.9 10/25/17   Panosh, Standley Brooking, MD  Lancets [facility] DELICA PLUS [id number]) [place] 100 Product by Other route See admin instructions. Check twice daily 06/20/18   Panosh, Standley Brooking, MD  liraglutide (VICTOZA) 18 MG/3ML SOPN INJECT 1.8 MG UNDER THE SKIN DAILY 07/22/18   Panosh, Standley Brooking, MD  metFORMIN (GLUCOPHAGE-XR) 500 MG 24 hr tablet Take 2 tablets (1,000 mg total) by mouth 2 (two) times daily with a meal. 06/20/18   Panosh, Standley Brooking, MD  Multiple Vitamins-Minerals (MULTIVITAMIN PO) Take by mouth daily.    [provider]  nitrofurantoin, macrocrystal-monohydrate, (MACROBID) 100 MG capsule Take 1 capsule (100 mg total) by mouth 2 (two) times daily. 07/13/18   Raylene Everts, MD  omeprazole (PRILOSEC) 40 MG capsule Take 1 capsule (40 mg total) by mouth 2 (two) times daily. 05/22/18 08/20/18  Mansouraty, Telford Nab., MD    Family History Family History  Problem Relation Age of Onset  . Emphysema Mother        Smoker  . Heart attack Mother        due to head injury  . Alcohol abuse Mother   . Heart disease Mother   . Early death Mother   . Varicose Veins Mother   . AAA  (abdominal aortic aneurysm) Mother   . Lung cancer Father   . Heart attack Father   . Hypertension Father   . Alcohol abuse Father   . Heart disease Father        before age 29  . Aneurysm Father   . Hyperlipidemia Father   . Alcohol abuse Brother   .  Diabetes Brother   . Heart disease Brother   . Heart attack Brother   . Diabetes Brother   . Aneurysm Paternal Grandmother   . Diabetes Other        grandfather  . Sudden Cardiac Death Brother   . Colon cancer Neg Hx   . Esophageal cancer Neg Hx   . Stomach cancer Neg Hx   . Inflammatory bowel disease Neg Hx   . Liver disease Neg Hx   . Pancreatic cancer Neg Hx   . Rectal cancer Neg Hx     Social History Social History   Tobacco Use  . Smoking status: Former Smoker    Quit date: 02/05/1994    Years since quitting: 24.4  . Smokeless tobacco: Never Used  Substance Use Topics  . Alcohol use: No    Alcohol/week: 0.0 standard drinks  . Drug use: No     Allergies   Penicillins   Review of Systems Review of Systems Pertinent negatives listed in HPI Physical Exam Triage Vital Signs ED Triage Vitals  Enc Vitals Group     BP 07/26/18 1054 104/68     Pulse Rate 07/26/18 1054 68     Resp 07/26/18 1054 16     Temp 07/26/18 1054 97.8 F (36.6 C)     Temp Source 07/26/18 1054 Oral     SpO2 07/26/18 1054 98 %     Weight 07/26/18 1051 160 lb (72.6 kg)     Height --      Head Circumference --      Peak Flow --      Pain Score 07/26/18 1050 5     Pain Loc --      Pain Edu? --      Excl. in [place]? --    No data found.  Updated Vital Signs BP 104/68 (BP Location: Right Arm)   Pulse 68   Temp 97.8 F (36.6 C) (Oral)   Resp 16   Wt 160 lb (72.6 kg)   SpO2 98%   BMI 28.34 kg/m   Visual Acuity Right Eye Distance:   Left Eye Distance:   Bilateral Distance:    Right Eye Near:   Left Eye Near:    Bilateral Near:     Physical Exam General appearance: alert, well developed, well nourished, cooperative and in no  distress Head: Normocephalic, without obvious abnormality, atraumatic Respiratory: Respirations even and unlabored, normal respiratory rate Heart: rate and rhythm normal. No gallop or murmurs noted on exam  Abdomen: BS +, no distention, no rebound tenderness, or no mass Extremities: No gross deformities Skin: Skin color, texture, turgor normal. No rashes seen  Psych: Appropriate mood and affect. Neurologic: Mental status: Alert, oriented to person, place, and time, thought content appropriate. UC Treatments / Results  Labs (all labs ordered are listed, but only abnormal results are displayed) Labs Reviewed  COMPREHENSIVE METABOLIC PANEL - Abnormal; Notable for the following components:      Result Value   Glucose, Bld 134 (*)    All other components within normal limits  POCT URINALYSIS DIP (DEVICE) - Abnormal; Notable for the following components:   Glucose, UA 500 (*)    Hgb urine dipstick MODERATE (*)    Leukocytes,Ua SMALL (*)    All other components within normal limits  URINE CULTURE  CBC    EKG None  Radiology No results found.  Procedures Procedures (including critical care time)  Medications Ordered in UC Medications - No data to  display  Initial Impression / Assessment and Plan / UC Course  I have reviewed the triage vital signs and the nursing notes.  Pertinent labs & imaging results that were available during my care of the patient were reviewed by me and considered in my medical decision making (see chart for details).     Patient presents with recurrent UTI times following 5 days of treatment with nitrofurantoin.  Patient was last seen here in urgent care 08/25/2018 and was found to have positive urine culture ESBL positive UTI.  Given this is a chronic recurrent issue will extend nitrofurantoin additional 10 days.  Patient is scheduled to have a surgical procedure on 08/11/2018.  She is advised to follow-up immediately if symptoms recur after this course of  treatment with antibiotic.  Is not advisable to proceed with procedure if infection remains present given patient is high risk for sepsis.  CBC and CMP are pending follow-up with patient via phone if results are abnormal.  Patient verbalized understanding and agreement with current plan. Final Clinical Impressions(s) / UC Diagnoses   Final diagnoses:  Urinary tract infection due to extended-spectrum beta lactamase (ESBL) producing Escherichia coli  Type 2 diabetes mellitus with other specified complication, without long-term current use of insulin [facility])     Discharge Instructions     Extended antibiotic treatment for an additional 10 days with nitrofurantoin.  If you develop urinary tract infections symptoms following treatment please follow-up here in urgent care with your PCP for treatment with a different antibiotic.  Goal is for infection to resolve prior to procedure scheduled for 08/11/2018.    ED Prescriptions    Medication Sig Dispense Auth. Provider   nitrofurantoin, macrocrystal-monohydrate, (MACROBID) 100 MG capsule Take 1 capsule (100 mg total) by mouth 2 (two) times daily. 20 capsule Scot Jun, FNP     Controlled Substance Prescriptions [place] Controlled Substance Registry consulted? Not Applicable   Scot Jun, FNP 07/26/18 1335

## 2018-07-26 NOTE — Discharge Instructions (Addendum)
Extended antibiotic treatment for an additional 10 days with nitrofurantoin.  If you develop urinary tract infections symptoms following treatment please follow-up here in urgent care with your PCP for treatment with a different antibiotic.  Goal is for infection to resolve prior to procedure scheduled for 08/11/2018.

## 2018-07-28 LAB — URINE CULTURE
Culture: 40000 — AB
Special Requests: NORMAL

## 2018-07-30 ENCOUNTER — Encounter: Payer: Self-pay | Admitting: Gastroenterology

## 2018-07-31 ENCOUNTER — Other Ambulatory Visit (HOSPITAL_COMMUNITY): Payer: PPO

## 2018-07-31 ENCOUNTER — Telehealth (HOSPITAL_COMMUNITY): Payer: Self-pay | Admitting: Emergency Medicine

## 2018-07-31 NOTE — Telephone Encounter (Signed)
Labs normal, urine treated with macrobid. Attempted to reach patient. No answer at this time.

## 2018-08-01 ENCOUNTER — Other Ambulatory Visit: Payer: Self-pay

## 2018-08-01 ENCOUNTER — Other Ambulatory Visit (INDEPENDENT_AMBULATORY_CARE_PROVIDER_SITE_OTHER): Payer: PPO

## 2018-08-01 ENCOUNTER — Other Ambulatory Visit (HOSPITAL_COMMUNITY): Payer: PPO

## 2018-08-01 DIAGNOSIS — E119 Type 2 diabetes mellitus without complications: Secondary | ICD-10-CM

## 2018-08-01 DIAGNOSIS — E785 Hyperlipidemia, unspecified: Secondary | ICD-10-CM

## 2018-08-01 DIAGNOSIS — I2510 Atherosclerotic heart disease of native coronary artery without angina pectoris: Secondary | ICD-10-CM

## 2018-08-01 DIAGNOSIS — E611 Iron deficiency: Secondary | ICD-10-CM

## 2018-08-01 DIAGNOSIS — Z79899 Other long term (current) drug therapy: Secondary | ICD-10-CM

## 2018-08-01 LAB — CBC WITH DIFFERENTIAL/PLATELET
Basophils Absolute: 0 10*3/uL (ref 0.0–0.1)
Basophils Relative: 0.6 % (ref 0.0–3.0)
Eosinophils Absolute: 0 10*3/uL (ref 0.0–0.7)
Eosinophils Relative: 0.7 % (ref 0.0–5.0)
HCT: 44 % (ref 36.0–46.0)
Hemoglobin: 14.7 g/dL (ref 12.0–15.0)
Lymphocytes Relative: 24.7 % (ref 12.0–46.0)
Lymphs Abs: 1.5 10*3/uL (ref 0.7–4.0)
MCHC: 33.5 g/dL (ref 30.0–36.0)
MCV: 93.8 fl (ref 78.0–100.0)
Monocytes Absolute: 0.4 10*3/uL (ref 0.1–1.0)
Monocytes Relative: 6.4 % (ref 3.0–12.0)
Neutro Abs: 4.2 10*3/uL (ref 1.4–7.7)
Neutrophils Relative %: 67.6 % (ref 43.0–77.0)
Platelets: 215 10*3/uL (ref 150.0–400.0)
RBC: 4.68 Mil/uL (ref 3.87–5.11)
RDW: 13.1 % (ref 11.5–15.5)
WBC: 6.2 10*3/uL (ref 4.0–10.5)

## 2018-08-01 LAB — BASIC METABOLIC PANEL
BUN: 11 mg/dL (ref 6–23)
CO2: 28 mEq/L (ref 19–32)
Calcium: 9.3 mg/dL (ref 8.4–10.5)
Chloride: 99 mEq/L (ref 96–112)
Creatinine, Ser: 0.72 mg/dL (ref 0.40–1.20)
GFR: 80.71 mL/min (ref >60.00)
Glucose, Bld: 149 mg/dL — ABNORMAL HIGH (ref 70–99)
Potassium: 4.2 mEq/L (ref 3.5–5.1)
Sodium: 137 mEq/L (ref 135–145)

## 2018-08-01 LAB — HEPATIC FUNCTION PANEL
ALT: 21 U/L (ref 0–35)
AST: 15 U/L (ref 0–37)
Albumin: 4.4 g/dL (ref 3.5–5.2)
Alkaline Phosphatase: 70 U/L (ref 39–117)
Bilirubin, Direct: 0.2 mg/dL (ref 0.0–0.3)
Total Bilirubin: 0.9 mg/dL (ref 0.2–1.2)
Total Protein: 6.3 g/dL (ref 6.0–8.3)

## 2018-08-01 LAB — LIPID PANEL
Cholesterol: 132 mg/dL (ref 0–200)
HDL: 44.9 mg/dL (ref >39.00)
LDL Cholesterol: 74 mg/dL (ref 0–99)
NonHDL: 87.46
Total CHOL/HDL Ratio: 3
Triglycerides: 69 mg/dL (ref 0.0–149.0)
VLDL: 13.8 mg/dL (ref 0.0–40.0)

## 2018-08-01 LAB — MICROALBUMIN / CREATININE URINE RATIO
Creatinine,U: 47.5 mg/dL
Microalb Creat Ratio: 1.5 mg/g (ref 0.0–30.0)
Microalb, Ur: <0.7 mg/dL (ref 0.0–1.9)

## 2018-08-01 LAB — HEMOGLOBIN A1C: Hgb A1c MFr Bld: 7.8 % — ABNORMAL HIGH (ref 4.6–6.5)

## 2018-08-07 ENCOUNTER — Other Ambulatory Visit: Payer: Self-pay

## 2018-08-07 ENCOUNTER — Inpatient Hospital Stay (HOSPITAL_COMMUNITY): Admission: RE | Admit: 2018-08-07 | Payer: PPO | Source: Ambulatory Visit

## 2018-08-07 ENCOUNTER — Telehealth: Payer: Self-pay | Admitting: Gastroenterology

## 2018-08-07 DIAGNOSIS — Z8711 Personal history of peptic ulcer disease: Secondary | ICD-10-CM

## 2018-08-07 DIAGNOSIS — D131 Benign neoplasm of stomach: Secondary | ICD-10-CM

## 2018-08-07 DIAGNOSIS — K317 Polyp of stomach and duodenum: Secondary | ICD-10-CM

## 2018-08-07 DIAGNOSIS — K3189 Other diseases of stomach and duodenum: Secondary | ICD-10-CM

## 2018-08-07 DIAGNOSIS — D122 Benign neoplasm of ascending colon: Secondary | ICD-10-CM

## 2018-08-07 DIAGNOSIS — D508 Other iron deficiency anemias: Secondary | ICD-10-CM

## 2018-08-07 MED ORDER — PEG 3350-KCL-NA BICARB-NACL 420 G PO SOLR: 4000.0000 mL | Freq: Once | ORAL | 0 refills | Status: AC

## 2018-08-07 NOTE — Telephone Encounter (Signed)
Patient return your call please call back.Thanks

## 2018-08-07 NOTE — Telephone Encounter (Signed)
Returned pt's call. We will get patient r/s to 09/29/2018 for her Endo + Colon @ [facility]. Pt has been informed. Pt will now have Covid Testing done on 09/25/2018. I will mail patient instructions as well as send prep to pts pharmacy.

## 2018-08-11 ENCOUNTER — Encounter (HOSPITAL_COMMUNITY): Admission: RE | Payer: Self-pay | Source: Home / Self Care

## 2018-08-11 ENCOUNTER — Ambulatory Visit (HOSPITAL_COMMUNITY): Admission: RE | Admit: 2018-08-11 | Payer: PPO | Source: Home / Self Care | Admitting: Gastroenterology

## 2018-08-11 SURGERY — ESOPHAGOGASTRODUODENOSCOPY (EGD) WITH PROPOFOL
Anesthesia: Monitor Anesthesia Care

## 2018-09-25 ENCOUNTER — Other Ambulatory Visit (HOSPITAL_COMMUNITY)
Admission: RE | Admit: 2018-09-25 | Discharge: 2018-09-25 | Disposition: A | Discharge status: Home / Self Care | Payer: PPO | Source: Ambulatory Visit | Attending: Gastroenterology | Admitting: Gastroenterology

## 2018-09-25 DIAGNOSIS — Z01812 Encounter for preprocedural laboratory examination: Secondary | ICD-10-CM | POA: Diagnosis not present

## 2018-09-25 DIAGNOSIS — Z20828 Contact with and (suspected) exposure to other viral communicable diseases: Secondary | ICD-10-CM | POA: Insufficient documentation

## 2018-09-25 LAB — SARS CORONAVIRUS 2 (TAT 6-24 HRS): SARS Coronavirus 2: NEGATIVE

## 2018-09-26 ENCOUNTER — Encounter (HOSPITAL_COMMUNITY): Payer: Self-pay | Admitting: *Deleted

## 2018-09-26 ENCOUNTER — Other Ambulatory Visit: Payer: Self-pay

## 2018-09-26 NOTE — Progress Notes (Signed)
Patient denies shortness of breath, fever, cough and chest pain.  PCP - Dr . Shanon Ace Cardiologist - Dr Jonette Mate - Dr Rush Landmark  Chest x-ray - Denies EKG - 12/27/2017 Stress Test - 10/2014 ECHO - 03/2002 Cardiac Cath - Denies  Fasting Blood Sugar - 140-160s Checks Blood Sugar _1_ times a day  Do not take Metformin or Victoza on DOS. Invokana-hold med the day before surgery and do not take on DOS.  Aspirin Instructions:Follow MD instructions for aspirin prior to surgery.  STOP now  taking any Aspirin (unless otherwise instructed by your surgeon), Aleve, Naproxen, Ibuprofen, Motrin, Advil, Goody's, BC's, all herbal medications, fish oil, and all vitamins.   Pt verbalized understanding of all pre-op instructions.

## 2018-09-26 NOTE — Progress Notes (Signed)
Attempted to call pt for pre call procedure. Called multiple times with no response and automated VM

## 2018-09-29 ENCOUNTER — Ambulatory Visit (HOSPITAL_COMMUNITY): Payer: PPO | Admitting: Certified Registered"

## 2018-09-29 ENCOUNTER — Encounter (HOSPITAL_COMMUNITY)
Admission: RE | Disposition: A | Discharge status: Home / Self Care | Payer: Self-pay | Source: Home / Self Care | Attending: Gastroenterology

## 2018-09-29 ENCOUNTER — Other Ambulatory Visit: Payer: Self-pay

## 2018-09-29 ENCOUNTER — Ambulatory Visit (HOSPITAL_COMMUNITY)
Admission: RE | Admit: 2018-09-29 | Discharge: 2018-09-29 | Disposition: A | Discharge status: Home / Self Care | Payer: PPO | Attending: Gastroenterology | Admitting: Gastroenterology

## 2018-09-29 ENCOUNTER — Encounter: Payer: PPO | Admitting: Gastroenterology

## 2018-09-29 ENCOUNTER — Encounter (HOSPITAL_COMMUNITY): Payer: Self-pay | Admitting: *Deleted

## 2018-09-29 DIAGNOSIS — Z8711 Personal history of peptic ulcer disease: Secondary | ICD-10-CM

## 2018-09-29 DIAGNOSIS — K3189 Other diseases of stomach and duodenum: Secondary | ICD-10-CM

## 2018-09-29 DIAGNOSIS — Z6826 Body mass index (BMI) 26.0-26.9, adult: Secondary | ICD-10-CM | POA: Diagnosis not present

## 2018-09-29 DIAGNOSIS — K219 Gastro-esophageal reflux disease without esophagitis: Secondary | ICD-10-CM | POA: Insufficient documentation

## 2018-09-29 DIAGNOSIS — M8580 Other specified disorders of bone density and structure, unspecified site: Secondary | ICD-10-CM | POA: Insufficient documentation

## 2018-09-29 DIAGNOSIS — D508 Other iron deficiency anemias: Secondary | ICD-10-CM

## 2018-09-29 DIAGNOSIS — K640 First degree hemorrhoids: Secondary | ICD-10-CM | POA: Diagnosis not present

## 2018-09-29 DIAGNOSIS — Z87891 Personal history of nicotine dependence: Secondary | ICD-10-CM | POA: Insufficient documentation

## 2018-09-29 DIAGNOSIS — K2950 Unspecified chronic gastritis without bleeding: Secondary | ICD-10-CM | POA: Insufficient documentation

## 2018-09-29 DIAGNOSIS — D122 Benign neoplasm of ascending colon: Secondary | ICD-10-CM

## 2018-09-29 DIAGNOSIS — E669 Obesity, unspecified: Secondary | ICD-10-CM | POA: Diagnosis not present

## 2018-09-29 DIAGNOSIS — Z1211 Encounter for screening for malignant neoplasm of colon: Secondary | ICD-10-CM | POA: Diagnosis not present

## 2018-09-29 DIAGNOSIS — K449 Diaphragmatic hernia without obstruction or gangrene: Secondary | ICD-10-CM | POA: Diagnosis not present

## 2018-09-29 DIAGNOSIS — K648 Other hemorrhoids: Secondary | ICD-10-CM | POA: Diagnosis not present

## 2018-09-29 DIAGNOSIS — E782 Mixed hyperlipidemia: Secondary | ICD-10-CM | POA: Insufficient documentation

## 2018-09-29 DIAGNOSIS — I252 Old myocardial infarction: Secondary | ICD-10-CM | POA: Insufficient documentation

## 2018-09-29 DIAGNOSIS — Z86010 Personal history of colonic polyps: Secondary | ICD-10-CM | POA: Insufficient documentation

## 2018-09-29 DIAGNOSIS — K228 Other specified diseases of esophagus: Secondary | ICD-10-CM | POA: Insufficient documentation

## 2018-09-29 DIAGNOSIS — K317 Polyp of stomach and duodenum: Secondary | ICD-10-CM

## 2018-09-29 DIAGNOSIS — D131 Benign neoplasm of stomach: Secondary | ICD-10-CM

## 2018-09-29 DIAGNOSIS — I2510 Atherosclerotic heart disease of native coronary artery without angina pectoris: Secondary | ICD-10-CM | POA: Diagnosis not present

## 2018-09-29 HISTORY — PX: ESOPHAGOGASTRODUODENOSCOPY: SHX5428

## 2018-09-29 HISTORY — PX: COLONOSCOPY WITH PROPOFOL: SHX5780

## 2018-09-29 HISTORY — DX: Other complications of anesthesia, initial encounter: T88.59XA

## 2018-09-29 HISTORY — PX: BIOPSY: SHX5522

## 2018-09-29 HISTORY — DX: Unspecified cataract: H26.9

## 2018-09-29 LAB — GLUCOSE, CAPILLARY: Glucose-Capillary: 172 mg/dL — ABNORMAL HIGH (ref 70–99)

## 2018-09-29 SURGERY — COLONOSCOPY WITH PROPOFOL
Anesthesia: Monitor Anesthesia Care

## 2018-09-29 MED ORDER — PROPOFOL 10 MG/ML IV BOLUS
INTRAVENOUS | Status: DC | PRN
  Administered 2018-09-29 (×2): 50 mg via INTRAVENOUS
  Administered 2018-09-29: 80 mg via INTRAVENOUS

## 2018-09-29 MED ORDER — FENTANYL CITRATE (PF) 100 MCG/2ML IJ SOLN
INTRAMUSCULAR | Status: DC | PRN
  Administered 2018-09-29 (×2): 50 ug via INTRAVENOUS

## 2018-09-29 MED ORDER — SODIUM CHLORIDE 0.9 % IV SOLN: INTRAVENOUS | Status: DC

## 2018-09-29 MED ORDER — GLYCOPYRROLATE PF 0.2 MG/ML IJ SOSY
PREFILLED_SYRINGE | INTRAMUSCULAR | Status: DC | PRN
  Administered 2018-09-29: .1 mg via INTRAVENOUS

## 2018-09-29 MED ORDER — LACTATED RINGERS IV SOLN
INTRAVENOUS | Status: DC | PRN
  Administered 2018-09-29: 07:00:00 via INTRAVENOUS

## 2018-09-29 MED ORDER — LIDOCAINE 2% (20 MG/ML) 5 ML SYRINGE
INTRAMUSCULAR | Status: DC | PRN
  Administered 2018-09-29: 50 mg via INTRAVENOUS

## 2018-09-29 MED ORDER — LACTATED RINGERS IV SOLN
INTRAVENOUS | Status: AC | PRN
  Administered 2018-09-29: 1000 mL via INTRAVENOUS

## 2018-09-29 MED ORDER — PROPOFOL 500 MG/50ML IV EMUL
INTRAVENOUS | Status: DC | PRN
  Administered 2018-09-29: 08:00:00 via INTRAVENOUS
  Administered 2018-09-29: 100 ug/kg/min via INTRAVENOUS

## 2018-09-29 MED ORDER — ONDANSETRON HCL 4 MG/2ML IJ SOLN
INTRAMUSCULAR | Status: DC | PRN
  Administered 2018-09-29: 4 mg via INTRAVENOUS

## 2018-09-29 SURGICAL SUPPLY — 22 items

## 2018-09-29 NOTE — Anesthesia Postprocedure Evaluation (Signed)
Anesthesia Post Note  Patient: Morgan [name]  Procedure(s) Performed: COLONOSCOPY WITH PROPOFOL (N/A ) ESOPHAGOGASTRODUODENOSCOPY (EGD) (N/A ) BIOPSY     Patient location during evaluation: Endoscopy Anesthesia Type: MAC Level of consciousness: awake Pain management: pain level controlled Vital Signs Assessment: post-procedure vital signs reviewed and stable Respiratory status: spontaneous breathing Postop Assessment: no apparent nausea or vomiting Anesthetic complications: no    Last Vitals:  Vitals:   09/29/18 0833 09/29/18 0844  BP: (!) 98/46 103/62  Pulse: (!) 54 (!) 55  Resp: 13 12  Temp: 36.5 C   SpO2: 100% 100%    Last Pain:  Vitals:   09/29/18 0844  TempSrc:   PainSc: 0-No pain   Pain Goal:                   Huston Foley

## 2018-09-29 NOTE — Transfer of Care (Signed)
Immediate Anesthesia Transfer of Care Note  Patient: Morgan [name]  Procedure(s) Performed: COLONOSCOPY WITH PROPOFOL (N/A ) ESOPHAGOGASTRODUODENOSCOPY (EGD) (N/A ) BIOPSY  Patient Location: Endoscopy Unit  Anesthesia Type:MAC  Level of Consciousness: drowsy  Airway & Oxygen Therapy: Patient Spontanous Breathing and Patient connected to nasal cannula oxygen  Post-op Assessment: Report given to RN and Post -op Vital signs reviewed and stable  Post vital signs: Reviewed and stable  Last Vitals:  Vitals Value Taken Time  BP    Temp    Pulse    Resp    SpO2      Last Pain:  Vitals:   09/29/18 0700  TempSrc: Oral  PainSc: 0-No pain         Complications: No apparent anesthesia complications

## 2018-09-29 NOTE — Discharge Instructions (Signed)
YOU HAD AN ENDOSCOPIC PROCEDURE TODAY: Refer to the procedure report and other information in the discharge instructions given to you for any specific questions about what was found during the examination. If this information does not answer your questions, please call [place] office at [phone] to clarify.  ° °YOU SHOULD EXPECT: Some feelings of bloating in the abdomen. Passage of more gas than usual. Walking can help get rid of the air that was put into your GI tract during the procedure and reduce the bloating. If you had a lower endoscopy (such as a colonoscopy or flexible sigmoidoscopy) you may notice spotting of blood in your stool or on the toilet paper. Some abdominal soreness may be present for a day or two, also. ° °DIET: Your first meal following the procedure should be a light meal and then it is ok to progress to your normal diet. A half-sandwich or bowl of soup is an example of a good first meal. Heavy or fried foods are harder to digest and may make you feel nauseous or bloated. Drink plenty of fluids but you should avoid alcoholic beverages for 24 hours. If you had a esophageal dilation, please see attached instructions for diet.   ° °ACTIVITY: Your care partner should take you home directly after the procedure. You should plan to take it easy, moving slowly for the rest of the day. You can resume normal activity the day after the procedure however YOU SHOULD NOT DRIVE, use power tools, machinery or perform tasks that involve climbing or major physical exertion for 24 hours (because of the sedation medicines used during the test).  ° °SYMPTOMS TO REPORT IMMEDIATELY: °A gastroenterologist can be reached at any hour. Please call [phone]  for any of the following symptoms:  °Following lower endoscopy (colonoscopy, flexible sigmoidoscopy) °Excessive amounts of blood in the stool  °Significant tenderness, worsening of abdominal pains  °Swelling of the abdomen that is new, acute  °Fever of 100° or  higher  °Following upper endoscopy (EGD, EUS, ERCP, esophageal dilation) °Vomiting of blood or coffee ground material  °New, significant abdominal pain  °New, significant chest pain or pain under the shoulder blades  °Painful or persistently difficult swallowing  °New shortness of breath  °Black, tarry-looking or red, bloody stools ° °FOLLOW UP:  °If any biopsies were taken you will be contacted by phone or by letter within the next 1-3 weeks. Call [phone]  if you have not heard about the biopsies in 3 weeks.  °Please also call with any specific questions about appointments or follow up tests. ° °

## 2018-09-29 NOTE — Anesthesia Preprocedure Evaluation (Signed)
Anesthesia Evaluation  Patient identified by MRN, date of birth, ID band Patient awake    Reviewed: Allergy & Precautions, NPO status , Patient's Chart, lab work & pertinent test results  History of Anesthesia Complications Negative for: history of anesthetic complications  Airway Mallampati: II  TM Distance: >3 FB Neck ROM: Full    Dental no notable dental hx.    Pulmonary neg pulmonary ROS, former smoker,    Pulmonary exam normal        Cardiovascular + CAD, + Past MI and + Cardiac Stents  Normal cardiovascular exam     Neuro/Psych negative neurological ROS  negative psych ROS   GI/Hepatic Neg liver ROS, PUD, GERD  ,  Endo/Other  diabetes, Insulin Dependent  Renal/GU negative Renal ROS  negative genitourinary   Musculoskeletal negative musculoskeletal ROS (+)   Abdominal Normal abdominal exam  (+)   Peds  Hematology negative hematology ROS (+)   Anesthesia Other Findings 68 yo F for EUS - CAD/MI s/p PCI to RCA (2003), 1st deg AVB, IDDM, GERD/PUD - [facility]PS 10/29/14: The inferior wall cannot be evaluated on the current study. Low risk stress nuclear study with otherwise normal perfusion and normal left ventricular regional and global systolic function. The previous study in 2011 described inferior wall scar with peri-infarct ischemia, but normal LVEF (73%)  Reproductive/Obstetrics                             Anesthesia Physical  Anesthesia Plan  ASA: III  Anesthesia Plan: MAC   Post-op Pain Management:    Induction:   PONV Risk Score and Plan: 2 and Propofol infusion and Treatment may vary due to age or medical condition  Airway Management Planned: Nasal Cannula and Simple Face Mask  Additional Equipment: None  Intra-op Plan:   Post-operative Plan:   Informed Consent: I have reviewed the patients History and Physical, chart, labs and discussed the procedure including the risks,  benefits and alternatives for the proposed anesthesia with the patient or authorized representative who has indicated his/her understanding and acceptance.       Plan Discussed with: CRNA  Anesthesia Plan Comments:         Anesthesia Quick Evaluation

## 2018-09-29 NOTE — Anesthesia Procedure Notes (Signed)
Procedure Name: MAC Date/Time: 09/29/2018 7:40 AM Performed by: Orlie Dakin, CRNA Pre-anesthesia Checklist: Patient identified, Emergency Drugs available, Suction available and Patient being monitored Patient Re-evaluated:Patient Re-evaluated prior to induction Oxygen Delivery Method: Nasal cannula Preoxygenation: Pre-oxygenation with 100% oxygen Induction Type: IV induction

## 2018-09-29 NOTE — Op Note (Signed)
[facility] Patient Name: Morgan [name] Procedure Date : 09/29/2018 MRN: [id number] Attending MD: Justice Britain , MD Date of Birth: Jul 01, 1950 CSN: [id number] Age: 68 Admit Type: Outpatient Procedure:                Upper GI endoscopy Indications:              Follow-up of gastric polyps, Follow-up of                            intestinal metaplasia Providers:                Justice Britain, MD, Vista Lawman, RN, William Dalton, Technician, Vania Rea, CRNA Referring MD:             Mauri Pole, MD, Shanon Ace, MD Medicines:                Monitored Anesthesia Care Complications:            No immediate complications. Estimated Blood Loss:     Estimated blood loss was minimal. Procedure:                Pre-Anesthesia Assessment:                           - Prior to the procedure, a History and Physical                            was performed, and patient medications and                            allergies were reviewed. The patient's tolerance of                            previous anesthesia was also reviewed. The risks                            and benefits of the procedure and the sedation                            options and risks were discussed with the patient.                            All questions were answered, and informed consent                            was obtained. Prior Anticoagulants: The patient has                            taken no previous anticoagulant or antiplatelet                            agents. ASA Grade Assessment: II - A patient with  mild systemic disease. After reviewing the risks                            and benefits, the patient was deemed in                            satisfactory condition to undergo the procedure.                           After obtaining informed consent, the endoscope was                            passed under direct vision.  Throughout the                            procedure, the patient's blood pressure, pulse, and                            oxygen saturations were monitored continuously. The                            [id number] Olympus therapeutic                            gastroscope was introduced through the mouth, and                            advanced to the second part of duodenum. The upper                            GI endoscopy was accomplished without difficulty.                            The patient tolerated the procedure. Scope In: Scope Out: Findings:      No gross lesions were noted in the proximal esophagus and in the mid       esophagus.      One tongue of salmon-colored mucosa was present from 35 to 37 cm. No       other visible abnormalities were present. Biopsies were taken with a       cold forceps for histology to rule out Barrett's.      A small hiatal hernia was present.      No gross lesions were noted in the entire examined stomach. Due to prior       metasplasia noted on biopsies, decision made to pursue gastric mapping.       Biopsies were taken with a cold forceps for histology from the fundus.       Biopsies were taken with a cold forceps for histology from the cardia.       Biopsies were taken with a cold forceps for histology from the greater       curve. Biopsies were taken with a cold forceps for histology from the       lesser curve. Biopsies were taken with a cold forceps for histology from       the incisura. Biopsies were taken with a  cold forceps for histology from       the antrum.      A 15 mm post mucosectomy scar was found in the prepyloric region of the       stomach. There was no evidence of the previous polyp. Some of the scar       had some prolapse into the pylorus, but there was no evidence of       obstruction. Biopsies were taken with a cold forceps for histology to       rule out remaining adenoma.      No gross lesions were noted in the  duodenal bulb, in the first portion       of the duodenum and in the second portion of the duodenum. Impression:               - No gross lesions in proximal/middle esophagus.                            Salmon-colored mucosa suggestive of short-segment                            Barrett's esophagus. Biopsied.                           - Small hiatal hernia.                           - No gross lesions in the stomach. Gastric mapping                            biopsies performed.                           - Scar in the prepyloric region of the stomach from                            previous mucosectomy without gross evidence of                            residual or recurrent polyp. Biopsied.                           - No gross lesions in the duodenal bulb, in the                            first portion of the duodenum and in the second                            portion of the duodenum. Recommendation:           - Proceed to scheduled colonoscopy.                           - Observe patient's clinical course.                           - Continue present medications.                           -  Await pathology results.                           - Repeat upper endoscopy in 1 year for surveillance                            if no evidence of recurrent adenoma is present on                            biopsies, otherwise, will require a repeat EMR of                            scar site and further region.                           - The findings and recommendations were discussed                            with the patient. Procedure Code(s):        --- Professional ---                           [phone], Esophagogastroduodenoscopy, flexible,                            transoral; with biopsy, single or multiple Diagnosis Code(s):        --- Professional ---                           K22.8, Other specified diseases of esophagus                           K44.9, Diaphragmatic hernia without  obstruction or                            gangrene                           K31.89, Other diseases of stomach and duodenum                           K31.7, Polyp of stomach and duodenum CPT copyright 2019 American Medical Association. All rights reserved. The codes documented in this report are preliminary and upon coder review may  be revised to meet current compliance requirements. Justice Britain, MD 09/29/2018 8:46:44 AM Number of Addenda: 0

## 2018-09-29 NOTE — H&P (Signed)
GASTROENTEROLOGY PROCEDURE H&P NOTE   Primary Care Physician: Burnis Medin, MD  HPI: Morgan [name] is a 68 y.o. female who presents for EGD/Colonoscopy.  Past Medical History:  Diagnosis Date  . Anemia    as a child   . CAD, NATIVE VESSEL 05/13/2009  . Cataracts, bilateral    MD just watching  . Complication of anesthesia   . DIABETES MELLITUS, TYPE II 12/09/2009  . Gastric ulcer   . Gastritis   . GERD (gastroesophageal reflux disease)   . Hx of abnormal cervical Pap smear    cryo  but normal since then   . HYPERLIPIDEMIA-MIXED 04/14/2008  . Intestinal metaplasia of gastric mucosa 2012  . Myocardial infarction ([place])    2003  . Obesity   . Occult blood in stools   . Osteopenia   . OVERWEIGHT/OBESITY 05/13/2009  . Ulcer    Duodenal ulcer by x-ray 40 years ago  . Varicose veins    under rx [facility] 2016   Past Surgical History:  Procedure Laterality Date  . CESAREAN SECTION     x2  . CLOSED REDUCTION PROXIMAL TIBIOFIBULAR JOINT [place]  2004   left  . COLONOSCOPY    . DILATION AND CURETTAGE OF UTERUS    . ECTOPIC PREGNANCY SURGERY    . ESOPHAGOGASTRODUODENOSCOPY N/A 02/12/2018   Procedure: ESOPHAGOGASTRODUODENOSCOPY (EGD);  Surgeon: Irving Copas., MD;  Location: [facility];  Service: Gastroenterology;  Laterality: N/A;  . EUS N/A 02/12/2018   Procedure: UPPER ENDOSCOPIC ULTRASOUND (EUS) RADIAL;  Surgeon: Rush Landmark Telford Nab., MD;  Location: [facility];  Service: Gastroenterology;  Laterality: N/A;  . FIBULA FRACTURE SURGERY    . GANGLION CYST EXCISION    . POLYPECTOMY  02/12/2018   Procedure: POLYPECTOMY;  Surgeon: Mansouraty, Telford Nab., MD;  Location: [facility];  Service: Gastroenterology;;  . TONSILLECTOMY    . TUBAL LIGATION     One Tube  . UPPER GASTROINTESTINAL ENDOSCOPY     08/27/17   Current Facility-Administered Medications  Medication Dose Route Frequency Provider Last Rate Last Dose  . lactated ringers infusion    Continuous  PRN Mansouraty, Telford Nab., MD   1,000 mL at 09/29/18 0710   Allergies  Allergen Reactions  . Penicillins Rash    Did it involve swelling of the face/tongue/throat, SOB, or low BP? No Did it involve sudden or severe rash/hives, skin peeling, or any reaction on the inside of your mouth or nose? No Did you need to seek medical attention at a hospital or doctor's office? No When did it last happen?childhood allergy If all above answers are "NO", may proceed with cephalosporin use.    Family History  Problem Relation Age of Onset  . Emphysema Mother        Smoker  . Heart attack Mother        due to head injury  . Alcohol abuse Mother   . Heart disease Mother   . Early death Mother   . Varicose Veins Mother   . AAA (abdominal aortic aneurysm) Mother   . Lung cancer Father   . Heart attack Father   . Hypertension Father   . Alcohol abuse Father   . Heart disease Father        before age 68  . Aneurysm Father   . Hyperlipidemia Father   . Alcohol abuse Brother   . Diabetes Brother   . Heart disease Brother   . Heart attack Brother   . Diabetes Brother   .  Aneurysm Paternal Grandmother   . Diabetes Other        grandfather  . Sudden Cardiac Death Brother   . Colon cancer Neg Hx   . Esophageal cancer Neg Hx   . Stomach cancer Neg Hx   . Inflammatory bowel disease Neg Hx   . Liver disease Neg Hx   . Pancreatic cancer Neg Hx   . Rectal cancer Neg Hx    Social History   Socioeconomic History  . Marital status: Widowed    Spouse name: Not on file  . Number of children: 2  . Years of education: phd   . Highest education level: Not on file  Occupational History  . Occupation: Mental Health Counselor    Comment: [employer]  . Occupation: Nurse, adult: [employer]  . Financial resource strain: Not on file  . Food insecurity    Worry: Not on file    Inability: Not on file  . Transportation needs    Medical: Not on file     Non-medical: Not on file  Tobacco Use  . Smoking status: Former Smoker    Types: Cigarettes    Quit date: 02/05/1994    Years since quitting: 24.6  . Smokeless tobacco: Never Used  Substance and Sexual Activity  . Alcohol use: No    Alcohol/week: 0.0 standard drinks  . Drug use: No  . Sexual activity: Not on file  Lifestyle  . Physical activity    Days per week: Not on file    Minutes per session: Not on file  . Stress: Not on file  Relationships  . Social Herbalist on phone: Not on file    Gets together: Not on file    Attends religious service: Not on file    Active member of club or organization: Not on file    Attends meetings of clubs or organizations: Not on file    Relationship status: Not on file  . Intimate partner violence    Fear of current or ex partner: Not on file    Emotionally abused: Not on file    Physically abused: Not on file    Forced sexual activity: Not on file  Other Topics Concern  . 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    No firearms    NO reg exercise    Pets- Poodles 2    Sleep 7 hours     Physical Exam: Vital signs in last 24 hours: Temp:  [97.8 F (36.6 C)] 97.8 F (36.6 C) (08/24 0700) Resp:  [13] 13 (08/24 0700) BP: (122)/(46) 122/46 (08/24 0700) SpO2:  [97 %] 97 % (08/24 0700) Weight:  [73.5 kg] 73.5 kg (08/24 0700)   GEN: NAD EYE: Sclerae anicteric ENT: MMM CV: Non-tachycardic GI: Soft, NT/ND NEURO:  Alert & Oriented x 3  Lab Results: No results for input(s): WBC, HGB, HCT, PLT in the last 72 hours. BMET No results for input(s): NA, K, CL, CO2, GLUCOSE, BUN, CREATININE, CALCIUM in the last 72 hours. LFT No results for input(s): PROT, ALBUMIN, AST, ALT, ALKPHOS, BILITOT, BILIDIR, IBILI  in the last  72 hours. PT/INR No results for input(s): LABPROT, INR in the last 72 hours.   Impression / Plan: This is a 68 y.o.female who presents for EGD/Colonoscopy.  The risks and benefits of endoscopic evaluation were discussed with the patient; these include but are not limited to the risk of perforation, infection, bleeding, missed lesions, lack of diagnosis, severe illness requiring hospitalization, as well as anesthesia and sedation related illnesses.  The patient is agreeable to proceed.    Justice Britain, MD [facility] Office # [phone]

## 2018-09-29 NOTE — Op Note (Signed)
[facility] Patient Name: Morgan [name] Procedure Date : 09/29/2018 MRN: [id number] Attending MD: Justice Britain , MD Date of Birth: 12/25/1950 CSN: [id number] Age: 68 Admit Type: Outpatient Procedure:                Colonoscopy Indications:              High risk colon cancer surveillance: Personal                            history of adenoma (10 mm or greater in size)                            removed 1-year ago in piecemeal fashion, Incidental                            - Follow-up for history of adenomatous polyps in                            the colon Providers:                Justice Britain, MD, Vista Lawman, RN, William Dalton, Technician, Vania Rea, CRNA Referring MD:             Mauri Pole, MD, Shanon Ace, MD Medicines:                Monitored Anesthesia Care Complications:            No immediate complications. Estimated Blood Loss:     Estimated blood loss was minimal. Estimated blood                            loss: none. Procedure:                Pre-Anesthesia Assessment:                           - Prior to the procedure, a History and Physical                            was performed, and patient medications and                            allergies were reviewed. The patient's tolerance of                            previous anesthesia was also reviewed. The risks                            and benefits of the procedure and the sedation                            options and risks were discussed with the patient.  All questions were answered, and informed consent                            was obtained. Prior Anticoagulants: The patient has                            taken no previous anticoagulant or antiplatelet                            agents. ASA Grade Assessment: II - A patient with                            mild systemic disease. After reviewing the risks                  and benefits, the patient was deemed in                            satisfactory condition to undergo the procedure.                           After obtaining informed consent, the colonoscope                            was passed under direct vision. Throughout the                            procedure, the patient's blood pressure, pulse, and                            oxygen saturations were monitored continuously. The                            [id number] ([id number] Olympus pediatric colonscope                            was introduced through the anus and advanced to the                            5 cm into the ileum. The colonoscopy was performed                            without difficulty. The patient tolerated the                            procedure. The quality of the bowel preparation was                            good. The terminal ileum, ileocecal valve,                            appendiceal orifice, and rectum were photographed. Scope In: 8:10:51 AM Scope Out: 8:27:47 AM Scope Withdrawal Time: 0 hours 11 minutes 19 seconds  Total Procedure Duration: 0 hours 16 minutes 56 seconds  Findings:      The digital rectal exam was normal. Pertinent negatives include no       palpable rectal lesions.      The terminal ileum and ileocecal valve appeared normal.      Normal mucosa was found in the entire colon.      Non-bleeding non-thrombosed internal hemorrhoids were found during       retroflexion. The hemorrhoids were Grade I (internal hemorrhoids that do       not prolapse). Impression:               - The examined portion of the ileum was normal.                           - Normal mucosa in the entire examined colon.                           - No evidence of recurrent polypoid tissue present                            on today's examination.                           - Non-bleeding non-thrombosed internal hemorrhoids. Recommendation:           - The patient will  be observed post-procedure,                            until all discharge criteria are met.                           - Discharge patient to home.                           - Patient has a contact number available for                            emergencies. The signs and symptoms of potential                            delayed complications were discussed with the                            patient. Return to normal activities tomorrow.                            Written discharge instructions were provided to the                            patient.                           - High fiber diet.                           - Continue present medications.                           -  Repeat colonoscopy in 3 years for surveillance.                            This can be done with Dr. Silverio Decamp (patient's                            primary GI).                           - The findings and recommendations were discussed                            with the patient. Procedure Code(s):        --- Professional ---                           G0174, Colorectal cancer screening; colonoscopy on                            individual at high risk Diagnosis Code(s):        --- Professional ---                           Z86.010, Personal history of colonic polyps                           K64.0, First degree hemorrhoids CPT copyright 2019 American Medical Association. All rights reserved. The codes documented in this report are preliminary and upon coder review may  be revised to meet current compliance requirements. Justice Britain, MD 09/29/2018 8:51:25 AM Number of Addenda: 0

## 2018-09-30 ENCOUNTER — Encounter (HOSPITAL_COMMUNITY): Payer: Self-pay | Admitting: Gastroenterology

## 2018-10-01 NOTE — Progress Notes (Signed)
Chief Complaint  Patient presents with  . Annual Exam    Pt present for annual exam  . Medication Management  . Diabetes    HPI: Morgan [name] 68 y.o. comes in today for Preventive Medicare exam/ wellness visit . And Chronic disease management  She has had her endo  And colon  bx neg so far and  Neg colon  To have 3 y fu.  [id number]  150 in am  About the same  No neuro or eye sx to get eye check this year  See med list   No lows  bp good HLD good  No dv sx  Sees gyne utd breast and gyne exam   Health Maintenance  Topic Date Due  . PNA vac Low Risk Adult (2 of 2 - PPSV23) 07/09/2017  . MAMMOGRAM  01/04/2018  . OPHTHALMOLOGY EXAM  08/21/2018  . INFLUENZA VACCINE  09/06/2018  . FOOT EXAM  09/21/2018  . HEMOGLOBIN A1C  01/31/2019  . URINE MICROALBUMIN  08/01/2019  . COLONOSCOPY  09/29/2019  . TETANUS/TDAP  09/23/2024  . DEXA SCAN  Completed  . Hepatitis C Screening  Completed  sees gyne  Health Maintenance Review LIFESTYLE:  Exercise:  Walks  20  Treadmill and wall push up as na sit ups.  Tobacco/ETS: no Alcohol:  no Sugar beverages: no Sleep: 7  Drug use: no HH:    40 plus  Hours.  Working virtual visits seem to work ok       Hearing: ok  Vision:  No limitations at present .glasses to get  Eye check   Safety:  Has smoke detector and wears seat belts.  . No excess sun exposure. Sees dentist regularly.  Falls: n  Memory: Felt to be good  , no concern from her or her family.  Depression: No anhedonia unusual crying or depressive symptoms  Nutrition: Eats well balanced diet; adequate calcium and vitamin D. No swallowing chewing problems.  Injury: no major injuries in the last six months.  Other healthcare providers:  Reviewed today .   Preventive parameters:   Reviewed   ADLS:   There are no problems or need for assistance  driving, feeding, obtaining food, dressing, toileting and bathing, managing money using phone. She is independent. working     ROS:  Left toe callous sore at times  No numnbess per se  GEN/ HEENT: No fever, significant weight changes sweats headaches vision problems hearing changes, CV/ PULM; No chest pain shortness of breath cough, syncope,edema  change in exercise tolerance. GI /GU: No adominal pain, vomiting, change in bowel habits. No blood in the stool. No significant GU symptoms. SKIN/HEME: ,no acute skin rashes suspicious lesions or bleeding. No lymphadenopathy, nodules, masses.  NEURO/ PSYCH:  No neurologic signs such as weakness numbness. No depression anxiety. IMM/ Allergy: No unusual infections.  Allergy .   REST of 12 system review negative except as per HPI   Past Medical History:  Diagnosis Date  . Anemia    as a child   . CAD, NATIVE VESSEL 05/13/2009  . Cataracts, bilateral    MD just watching  . Complication of anesthesia   . DIABETES MELLITUS, TYPE II 12/09/2009  . Gastric ulcer   . Gastritis   . GERD (gastroesophageal reflux disease)   . Hx of abnormal cervical Pap smear    cryo  but normal since then   . HYPERLIPIDEMIA-MIXED 04/14/2008  . Intestinal metaplasia of gastric mucosa 2012  . Myocardial  infarction ([place])    2003  . Obesity   . Occult blood in stools   . Osteopenia   . OVERWEIGHT/OBESITY 05/13/2009  . Ulcer    Duodenal ulcer by x-ray 40 years ago  . Varicose veins    under rx [facility] 2016    Family History  Problem Relation Age of Onset  . Emphysema Mother        Smoker  . Heart attack Mother        due to head injury  . Alcohol abuse Mother   . Heart disease Mother   . Early death Mother   . Varicose Veins Mother   . AAA (abdominal aortic aneurysm) Mother   . Lung cancer Father   . Heart attack Father   . Hypertension Father   . Alcohol abuse Father   . Heart disease Father        before age 71  . Aneurysm Father   . Hyperlipidemia Father   . Alcohol abuse Brother   . Diabetes Brother   . Heart disease Brother   . Heart attack Brother   . Diabetes Brother    . Aneurysm Paternal Grandmother   . Diabetes Other        grandfather  . Sudden Cardiac Death Brother   . Colon cancer Neg Hx   . Esophageal cancer Neg Hx   . Stomach cancer Neg Hx   . Inflammatory bowel disease Neg Hx   . Liver disease Neg Hx   . Pancreatic cancer Neg Hx   . Rectal cancer Neg Hx     Social History   Socioeconomic History  . Marital status: Widowed    Spouse name: Not on file  . Number of children: 2  . Years of education: phd   . Highest education level: Not on file  Occupational History  . Occupation: Mental Health Counselor    Comment: [employer]  . Occupation: Nurse, adult: [employer]  . Financial resource strain: Not on file  . Food insecurity    Worry: Not on file    Inability: Not on file  . Transportation needs    Medical: Not on file    Non-medical: Not on file  Tobacco Use  . Smoking status: Former Smoker    Types: Cigarettes    Quit date: 02/05/1994    Years since quitting: 24.6  . Smokeless tobacco: Never Used  Substance and Sexual Activity  . Alcohol use: No    Alcohol/week: 0.0 standard drinks  . Drug use: No  . Sexual activity: Not on file  Lifestyle  . Physical activity    Days per week: Not on file    Minutes per session: Not on file  . Stress: Not on file  Relationships  . Social Herbalist on phone: Not on file    Gets together: Not on file    Attends religious service: Not on file    Active member of club or organization: Not on file    Attends meetings of clubs or organizations: Not on file    Relationship status: Not on file  Other Topics Concern  . 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    No firearms    NO reg exercise    Pets- Poodles 2    Sleep 7  hours     Outpatient Encounter Medications as of 10/03/2018  Medication Sig  . acetaminophen (TYLENOL) 500 MG tablet Take 1,000 mg by mouth every 6 (six) hours as needed for moderate pain or headache.  Marland Kitchen aspirin 81 MG tablet Take 81 mg by mouth daily.    Marland Kitchen atorvastatin (LIPITOR) 80 MG tablet TAKE 1 TABLET BY MOUTH EVERY DAY AT 6 pm (Patient taking differently: Take 80 mg by mouth daily. )  . canagliflozin (INVOKANA) 300 MG TABS tablet TAKE 1 TABLET BY MOUTH EVERY DAY BEFORE BREAKFAST (Patient taking differently: Take 300 mg by mouth daily before breakfast. )  . Cholecalciferol (VITAMIN D3) 125 MCG (5000 UT) CAPS Take 5,000 Units by mouth daily.  . clindamycin (CLEOCIN T) 1 % lotion Apply 1 application topically daily.  . Cranberry-Vitamin C-Vitamin E (CRANBERRY PLUS VITAMIN C) 4200-20-3 MG-MG-UNIT CAPS Take 2 capsules by mouth daily.  . Cyanocobalamin (B-12 PO) Take 2 tablets by mouth daily. 1000 mcg per  . ezetimibe (ZETIA) 10 MG tablet TAKE 1 TABLET BY MOUTH EVERY DAY (Patient taking differently: Take 10 mg by mouth daily. )  . Ferrous Sulfate (IRON) 325 (65 Fe) MG TABS Take 650 mg by mouth daily.   Marland Kitchen glucose blood (ONE TOUCH ULTRA TEST) test strip One Touch Ultra:  Test blood glucose 2-3 times a day.  . Insulin Pen Needle (B-D UF III MINI PEN NEEDLES) 31G X 5 MM MISC Inject 1.8mg  under the skin daily.  Elmore Guise Devices (ONE TOUCH DELICA LANCING DEV) MISC Use as directed to check blood sugars twice to four time per day. Dx E11.9  . Lancets (ONETOUCH DELICA PLUS 123XX123) MISC 100 Product by Other route See admin instructions. Check twice daily  . liraglutide (VICTOZA) 18 MG/3ML SOPN INJECT 1.8 MG UNDER THE SKIN DAILY (Patient taking differently: Inject 1.8 mg into the skin daily. )  . metFORMIN (GLUCOPHAGE-XR) 500 MG 24 hr tablet Take 2 tablets (1,000 mg total) by mouth 2 (two) times daily with a meal.  . Multiple Vitamins-Minerals (MULTIVITAMIN PO) Take 1 tablet by mouth daily.   . Omega-3  Fatty Acids (FISH OIL) 1000 MG CAPS Take 2,000 mg by mouth daily.  Marland Kitchen omeprazole (PRILOSEC) 40 MG capsule Take 1 capsule (40 mg total) by mouth 2 (two) times daily. (Patient taking differently: Take 40 mg by mouth daily. )  . Semaglutide (RYBELSUS) 7 MG TABS Take 1 tablet by mouth daily.   No facility-administered encounter medications on file as of 10/03/2018.     EXAM:  BP 120/80 Comment: faint  Pulse 60   Temp 98 F (36.7 C) (Other (Comment)) Comment (Src): no touch themometer  Ht 5' 5.5" (1.664 m)   Wt 170 lb (77.1 kg)   LMP  (LMP Unknown)   SpO2 98%   BMI 27.86 kg/m   Body mass index is 27.86 kg/m. BP Readings from Last 3 Encounters:  10/03/18 120/80  09/29/18 103/62  07/26/18 104/68   Wt Readings from Last 3 Encounters:  10/03/18 170 lb (77.1 kg)  09/29/18 162 lb (73.5 kg)  07/26/18 160 lb (72.6 kg)     Physical Exam: Vital signs reviewed [id number] is a well-developed well-nourished alert cooperative   who appears stated age in no acute distress.  HEENT: normocephalic atraumatic ,  Eyes: PERRL EOM's full, conjunctiva clear, Nares: paten,t no deformity discharge or tenderness., Ears: no deformity EAC's clear TMs with normal landmarks. Mouth: clear OP, no lesions, edema.  Moist mucous membranes. Dentition in adequate repair. NECK: supple without masses, thyromegaly or bruits. CHEST/PULM:  Clear to auscultation and percussion breath sounds equal no wheeze , rales or rhonchi. No chest wall deformities or tenderness. CV: PMI is nondisplaced, S1 S2 no gallops, murmurs, rubs. Peripheral pulses are full without delay.No JVD .  ABDOMEN: Bowel sounds normal nontender  No guard or rebound, no hepato splenomegal no CVA tenderness.   Extremtities:  No clubbing cyanosis or edema, no acute joint swelling or redness no focal atrophy NEURO:  Oriented x3, cranial nerves 3-12 appear to be intact, no obvious focal weakness,gait within normal limits no abnormal reflexes or asymmetrical SKIN:  No acute rashes normal turgor, color, no bruising or petechiae. PSYCH: Oriented, good eye contact, no obvious depression anxiety, cognition and judgment appear normal. LN: no cervical axillary inguinal adenopathy No noted deficits in memory, attention, and speech.  Diabetic Foot Exam - Simple   Simple Foot Form Diabetic Foot exam was performed with the following findings: Yes 10/03/2018 10:02 AM  Visual Inspection No deformities, no ulcerations, no other skin breakdown bilaterally: Yes Sensation Testing Intact to touch and monofilament testing bilaterally: Yes Pulse Check Posterior Tibialis and Dorsalis pulse intact bilaterally: Yes Comments Left great toe with medial  callopus and no ulcer       Lab Results  Component Value Date   WBC 6.2 08/01/2018   HGB 14.7 08/01/2018   HCT 44.0 08/01/2018   PLT 215.0 08/01/2018   GLUCOSE 149 (H) 08/01/2018   CHOL 132 08/01/2018   TRIG 69.0 08/01/2018   HDL 44.90 08/01/2018   LDLCALC 74 08/01/2018   ALT 21 08/01/2018   AST 15 08/01/2018   NA 137 08/01/2018   K 4.2 08/01/2018   CL 99 08/01/2018   CREATININE 0.72 08/01/2018   BUN 11 08/01/2018   CO2 28 08/01/2018   TSH 2.84 09/20/2017   INR 0.9 01/13/2018   HGBA1C 7.8 (H) 08/01/2018   MICROALBUR <0.7 08/01/2018    ASSESSMENT AND PLAN:  Discussed the following assessment and plan:  Visit for preventive health examination  Diabetes mellitus without complication ([place]) - [id number] creeping up try oral rybeslus instead of victoza 7 mg with optino to inc to 14   Need for influenza vaccination - Plan: Flu Vaccine QUAD High Dose(Fluad)  Medication management  Hyperlipidemia, unspecified hyperlipidemia type Plan rov in about 3 mos with a1c at the visit  Foot care and eye check when due   Flu and pneumovax 23 today  Patient Care Team: Burnis Medin, MD as PCP - General (Internal Medicine) Martinique, Amy, MD as Consulting Physician (Dermatology) Dian Queen, MD as Attending  Physician (Obstetrics and Gynecology) Latanya Maudlin, MD as Consulting Physician (Orthopedic Surgery)  Patient Instructions   Health Maintenance Due  Topic Date Due  . PNA vac Low Risk Adult (2 of 2 - PPSV23) 07/09/2017  . MAMMOGRAM  01/04/2018  . OPHTHALMOLOGY EXAM  08/21/2018  . INFLUENZA VACCINE  09/06/2018  . FOOT EXAM  09/21/2018    let try oral  Med instead of victoza  I am givein you the medium  dose and  If needed there is a higher dose.   Stop the victoza if on rybelsus    Plan rov with hg a1c poc in 3 months    Health Maintenance, Female  Adopting a healthy lifestyle and getting preventive care are important in promoting health and wellness. Ask your health care provider about:  The right schedule for you to have regular tests and exams.  Things you can do on your own to prevent diseases and keep yourself healthy. What should I know about diet, weight, and exercise? Eat a healthy diet   Eat a diet that includes plenty of vegetables, fruits, low-fat dairy products, and lean protein.  Do not eat a lot of foods that are high in solid fats, added sugars, or sodium. Maintain a healthy weight Body mass index (BMI) is used to identify weight problems. It estimates body fat based on height and weight. Your health care provider can help determine your BMI and help you achieve or maintain a healthy weight. Get regular exercise Get regular exercise. This is one of the most important things you can do for your health. Most adults should:  Exercise for at least 150 minutes each week. The exercise should increase your heart rate and make you sweat (moderate-intensity exercise).  Do strengthening exercises at least twice a week. This is in addition to the moderate-intensity exercise.  Spend less time sitting. Even light physical activity can be beneficial. Watch cholesterol and blood lipids Have your blood tested for lipids and cholesterol at 67 years of age, then have this  test every 5 years. Have your cholesterol levels checked more often if:  Your lipid or cholesterol levels are high.  You are older than 67 years of age.  You are at high risk for heart disease. What should I know about cancer screening? Depending on your health history and family history, you may need to have cancer screening at various ages. This may include screening for:  Breast cancer.  Cervical cancer.  Colorectal cancer.  Skin cancer.  Lung cancer. What should I know about heart disease, diabetes, and high blood pressure? Blood pressure and heart disease  High blood pressure causes heart disease and increases the risk of stroke. This is more likely to develop in people who have high blood pressure readings, are of African descent, or are overweight.  Have your blood pressure checked: ? Every 3-5 years if you are 39-78 years of age. ? Every year if you are 63 years old or older. Diabetes Have regular diabetes screenings. This checks your fasting blood sugar level. Have the screening done:  Once every three years after age 24 if you are at a normal weight and have a low risk for diabetes.  More often and at a younger age if you are overweight or have a high risk for diabetes. What should I know about preventing infection? Hepatitis B If you have a higher risk for hepatitis B, you should be screened for this virus. Talk with your health care provider to find out if you are at risk for hepatitis B infection. Hepatitis C Testing is recommended for:  Everyone born from [birth year] through 1964.  Anyone with known risk factors for hepatitis C. Sexually transmitted infections (STIs)  Get screened for STIs, including gonorrhea and chlamydia, if: ? You are sexually active and are younger than 67 years of age. ? You are older than 67 years of age and your health care provider tells you that you are at risk for this type of infection. ? Your sexual activity has changed since you  were last screened, and you are at increased risk for chlamydia or gonorrhea. Ask your health care provider if you are  at risk.  Ask your health care provider about whether you are at high risk for HIV. Your health care provider may recommend a prescription medicine to help prevent HIV infection. If you choose to take medicine to prevent HIV, you should first get tested for HIV. You should then be tested every 3 months for as long as you are taking the medicine. Pregnancy  If you are about to stop having your period (premenopausal) and you may become pregnant, seek counseling before you get pregnant.  Take 400 to 800 micrograms (mcg) of folic acid every day if you become pregnant.  Ask for birth control (contraception) if you want to prevent pregnancy. Osteoporosis and menopause Osteoporosis is a disease in which the bones lose minerals and strength with aging. This can result in bone fractures. If you are 57 years old or older, or if you are at risk for osteoporosis and fractures, ask your health care provider if you should:  Be screened for bone loss.  Take a calcium or vitamin D supplement to lower your risk of fractures.  Be given hormone replacement therapy (HRT) to treat symptoms of menopause. Follow these instructions at home: Lifestyle  Do not use any products that contain nicotine or tobacco, such as cigarettes, e-cigarettes, and chewing tobacco. If you need help quitting, ask your health care provider.  Do not use street drugs.  Do not share needles.  Ask your health care provider for help if you need support or information about quitting drugs. Alcohol use  Do not drink alcohol if: ? Your health care provider tells you not to drink. ? You are pregnant, may be pregnant, or are planning to become pregnant.  If you drink alcohol: ? Limit how much you use to 0-1 drink a day. ? Limit intake if you are breastfeeding.  Be aware of how much alcohol is in your drink. In the  [place] one drink equals one 12 oz bottle of beer (355 mL), one 5 oz glass of wine (148 mL), or one 1 oz glass of hard liquor (44 mL). General instructions  Schedule regular health, dental, and eye exams.  Stay current with your vaccines.  Tell your health care provider if: ? You often feel depressed. ? You have ever been abused or do not feel safe at home. Summary  Adopting a healthy lifestyle and getting preventive care are important in promoting health and wellness.  Follow your health care provider's instructions about healthy diet, exercising, and getting tested or screened for diseases.  Follow your health care provider's instructions on monitoring your cholesterol and blood pressure. This information is not intended to replace advice given to you by your health care provider. Make sure you discuss any questions you have with your health care provider. Document Released: 08/07/2010 Document Revised: 01/15/2018 Document Reviewed: 01/15/2018 Elsevier Patient Education  2020 Harrison Nasirah Sachs M.D.

## 2018-10-03 ENCOUNTER — Other Ambulatory Visit: Payer: Self-pay

## 2018-10-03 ENCOUNTER — Ambulatory Visit (INDEPENDENT_AMBULATORY_CARE_PROVIDER_SITE_OTHER): Payer: PPO | Admitting: Internal Medicine

## 2018-10-03 ENCOUNTER — Encounter: Payer: Self-pay | Admitting: Internal Medicine

## 2018-10-03 ENCOUNTER — Telehealth: Payer: Self-pay | Admitting: Internal Medicine

## 2018-10-03 VITALS — BP 120/80 | HR 60 | Temp 98.0°F | Ht 65.5 in | Wt 170.0 lb

## 2018-10-03 DIAGNOSIS — Z79899 Other long term (current) drug therapy: Secondary | ICD-10-CM | POA: Diagnosis not present

## 2018-10-03 DIAGNOSIS — E785 Hyperlipidemia, unspecified: Secondary | ICD-10-CM

## 2018-10-03 DIAGNOSIS — E119 Type 2 diabetes mellitus without complications: Secondary | ICD-10-CM | POA: Diagnosis not present

## 2018-10-03 DIAGNOSIS — Z23 Encounter for immunization: Secondary | ICD-10-CM | POA: Diagnosis not present

## 2018-10-03 DIAGNOSIS — Z0000 Encounter for general adult medical examination without abnormal findings: Secondary | ICD-10-CM

## 2018-10-03 MED ORDER — RYBELSUS 7 MG PO TABS: 1.0000 | ORAL_TABLET | Freq: Every day | ORAL | 3 refills | Status: DC

## 2018-10-03 NOTE — Telephone Encounter (Signed)
Medication: Semaglutide (RYBELSUS) 7 MG TABS [id number] reqeusting a 90 day script  Has the patient contacted their pharmacy? Yes  (Agent: If no, request that the patient contact the pharmacy for the refill.) (Agent: If yes, when and what did the pharmacy advise?)  Preferred Pharmacy (with phone number or street name): [facility] - [place], [place] - [address] [phone] (Phone) [phone] (Fax)    Agent: Please be advised that RX refills may take up to 3 business days. We ask that you follow-up with your pharmacy.

## 2018-10-03 NOTE — Patient Instructions (Addendum)
Health Maintenance Due  Topic Date Due  . PNA vac Low Risk Adult (2 of 2 - PPSV23) 07/09/2017  . MAMMOGRAM  01/04/2018  . OPHTHALMOLOGY EXAM  08/21/2018  . INFLUENZA VACCINE  09/06/2018  . FOOT EXAM  09/21/2018    let try oral  Med instead of victoza  I am givein you the medium  dose and  If needed there is a higher dose.   Stop the victoza if on rybelsus    Plan rov with hg a1c poc in 3 months    Health Maintenance, Female Adopting a healthy lifestyle and getting preventive care are important in promoting health and wellness. Ask your health care provider about:  The right schedule for you to have regular tests and exams.  Things you can do on your own to prevent diseases and keep yourself healthy. What should I know about diet, weight, and exercise? Eat a healthy diet   Eat a diet that includes plenty of vegetables, fruits, low-fat dairy products, and lean protein.  Do not eat a lot of foods that are high in solid fats, added sugars, or sodium. Maintain a healthy weight Body mass index (BMI) is used to identify weight problems. It estimates body fat based on height and weight. Your health care provider can help determine your BMI and help you achieve or maintain a healthy weight. Get regular exercise Get regular exercise. This is one of the most important things you can do for your health. Most adults should:  Exercise for at least 150 minutes each week. The exercise should increase your heart rate and make you sweat (moderate-intensity exercise).  Do strengthening exercises at least twice a week. This is in addition to the moderate-intensity exercise.  Spend less time sitting. Even light physical activity can be beneficial. Watch cholesterol and blood lipids Have your blood tested for lipids and cholesterol at 67 years of age, then have this test every 5 years. Have your cholesterol levels checked more often if:  Your lipid or cholesterol levels are high.  You are older  than 67 years of age.  You are at high risk for heart disease. What should I know about cancer screening? Depending on your health history and family history, you may need to have cancer screening at various ages. This may include screening for:  Breast cancer.  Cervical cancer.  Colorectal cancer.  Skin cancer.  Lung cancer. What should I know about heart disease, diabetes, and high blood pressure? Blood pressure and heart disease  High blood pressure causes heart disease and increases the risk of stroke. This is more likely to develop in people who have high blood pressure readings, are of African descent, or are overweight.  Have your blood pressure checked: ? Every 3-5 years if you are 33-63 years of age. ? Every year if you are 38 years old or older. Diabetes Have regular diabetes screenings. This checks your fasting blood sugar level. Have the screening done:  Once every three years after age 45 if you are at a normal weight and have a low risk for diabetes.  More often and at a younger age if you are overweight or have a high risk for diabetes. What should I know about preventing infection? Hepatitis B If you have a higher risk for hepatitis B, you should be screened for this virus. Talk with your health care provider to find out if you are at risk for hepatitis B infection. Hepatitis C Testing is recommended for:  Everyone born from [birth year] through 1964.  Anyone with known risk factors for hepatitis C. Sexually transmitted infections (STIs)  Get screened for STIs, including gonorrhea and chlamydia, if: ? You are sexually active and are younger than 67 years of age. ? You are older than 67 years of age and your health care provider tells you that you are at risk for this type of infection. ? Your sexual activity has changed since you were last screened, and you are at increased risk for chlamydia or gonorrhea. Ask your health care provider if you are at risk.  Ask  your health care provider about whether you are at high risk for HIV. Your health care provider may recommend a prescription medicine to help prevent HIV infection. If you choose to take medicine to prevent HIV, you should first get tested for HIV. You should then be tested every 3 months for as long as you are taking the medicine. Pregnancy  If you are about to stop having your period (premenopausal) and you may become pregnant, seek counseling before you get pregnant.  Take 400 to 800 micrograms (mcg) of folic acid every day if you become pregnant.  Ask for birth control (contraception) if you want to prevent pregnancy. Osteoporosis and menopause Osteoporosis is a disease in which the bones lose minerals and strength with aging. This can result in bone fractures. If you are 22 years old or older, or if you are at risk for osteoporosis and fractures, ask your health care provider if you should:  Be screened for bone loss.  Take a calcium or vitamin D supplement to lower your risk of fractures.  Be given hormone replacement therapy (HRT) to treat symptoms of menopause. Follow these instructions at home: Lifestyle  Do not use any products that contain nicotine or tobacco, such as cigarettes, e-cigarettes, and chewing tobacco. If you need help quitting, ask your health care provider.  Do not use street drugs.  Do not share needles.  Ask your health care provider for help if you need support or information about quitting drugs. Alcohol use  Do not drink alcohol if: ? Your health care provider tells you not to drink. ? You are pregnant, may be pregnant, or are planning to become pregnant.  If you drink alcohol: ? Limit how much you use to 0-1 drink a day. ? Limit intake if you are breastfeeding.  Be aware of how much alcohol is in your drink. In the [place] one drink equals one 12 oz bottle of beer (355 mL), one 5 oz glass of wine (148 mL), or one 1 oz glass of hard liquor (44  mL). General instructions  Schedule regular health, dental, and eye exams.  Stay current with your vaccines.  Tell your health care provider if: ? You often feel depressed. ? You have ever been abused or do not feel safe at home. Summary  Adopting a healthy lifestyle and getting preventive care are important in promoting health and wellness.  Follow your health care provider's instructions about healthy diet, exercising, and getting tested or screened for diseases.  Follow your health care provider's instructions on monitoring your cholesterol and blood pressure. This information is not intended to replace advice given to you by your health care provider. Make sure you discuss any questions you have with your health care provider. Document Released: 08/07/2010 Document Revised: 01/15/2018 Document Reviewed: 01/15/2018 Elsevier Patient Education  2020 [employer].   I

## 2018-10-06 ENCOUNTER — Encounter: Payer: Self-pay | Admitting: Gastroenterology

## 2018-10-06 NOTE — Telephone Encounter (Signed)
This has been taking care of.

## 2018-10-07 ENCOUNTER — Telehealth: Payer: Self-pay | Admitting: Internal Medicine

## 2018-10-07 MED ORDER — RYBELSUS 7 MG PO TABS: 1.0000 | ORAL_TABLET | Freq: Every day | ORAL | 2 refills | Status: DC

## 2018-10-07 NOTE — Telephone Encounter (Signed)
Copied from [place] [id number]. Topic: General - Other >> Oct 07, 2018  8:24 AM Keene Breath wrote: Reason for CRM: Called because the patient is requesting a 90-day supply of Semaglutide (RYBELSUS) 7 MG TABS, and the script was written for 30 days.  Pharmacy would like to know if it can be changed to 90 days.  Please advise and call back at [phone]

## 2018-10-07 NOTE — Telephone Encounter (Signed)
This has been sent in  

## 2018-10-07 NOTE — Telephone Encounter (Signed)
Ok to do this  madisone please send this in

## 2018-10-26 DIAGNOSIS — H524 Presbyopia: Secondary | ICD-10-CM | POA: Diagnosis not present

## 2018-11-11 ENCOUNTER — Other Ambulatory Visit: Payer: Self-pay | Admitting: Internal Medicine

## 2018-11-26 NOTE — Telephone Encounter (Signed)
Try increasing dose to 14 mg  Per day ( can take 2of the  7 mg at a time ) and  Then let me know in  2 weeks what readings are doing .  The pill comes in 14 mg ( max dose)

## 2018-11-26 NOTE — Telephone Encounter (Signed)
Thanks for the update   I agree that sugar is too high   Make sure you are taking  30 minutes before first  "Food drink med "     And less than 4 oz of watere  If bg not coming down  We may increase dose to 14 mg  Let me know what sugars are   In another week

## 2018-11-28 MED ORDER — NITROFURANTOIN MONOHYD MACRO 100 MG PO CAPS: 100.0000 mg | ORAL_CAPSULE | Freq: Two times a day (BID) | ORAL | 0 refills | Status: AC

## 2018-11-28 NOTE — Telephone Encounter (Signed)
I will send in macrobid  antibiotic  [facility]   And go back on the [place]    Not sure why not working  For you.    Ask you  Insurance  if  ozempic  the weekly medication   Injectable   Is covered more completely by insuance     (Mostly to see if works better than  victoza   )

## 2018-12-04 ENCOUNTER — Other Ambulatory Visit: Payer: Self-pay | Admitting: Internal Medicine

## 2018-12-11 NOTE — Telephone Encounter (Signed)
Would use  1 mg  weekly

## 2018-12-22 MED ORDER — OMEPRAZOLE 40 MG PO CPDR: 40.0000 mg | DELAYED_RELEASE_CAPSULE | Freq: Two times a day (BID) | ORAL | 0 refills | Status: DC

## 2018-12-25 ENCOUNTER — Other Ambulatory Visit: Payer: Self-pay

## 2018-12-26 ENCOUNTER — Encounter: Payer: Self-pay | Admitting: Internal Medicine

## 2018-12-26 ENCOUNTER — Ambulatory Visit (INDEPENDENT_AMBULATORY_CARE_PROVIDER_SITE_OTHER): Payer: PPO | Admitting: Internal Medicine

## 2018-12-26 VITALS — BP 124/62 | HR 65 | Temp 97.6°F | Ht 65.0 in | Wt 170.4 lb

## 2018-12-26 DIAGNOSIS — E119 Type 2 diabetes mellitus without complications: Secondary | ICD-10-CM

## 2018-12-26 DIAGNOSIS — T887XXA Unspecified adverse effect of drug or medicament, initial encounter: Secondary | ICD-10-CM

## 2018-12-26 LAB — POCT GLYCOSYLATED HEMOGLOBIN (HGB A1C): Hemoglobin A1C: 8 % — AB (ref 4.0–5.6)

## 2018-12-26 MED ORDER — OZEMPIC (1 MG/DOSE) 2 MG/1.5ML ~~LOC~~ SOPN: 1.0000 mg | PEN_INJECTOR | SUBCUTANEOUS | 6 refills | Status: DC

## 2018-12-26 NOTE — Patient Instructions (Addendum)
Sending in ozempic  Weekly injection.

## 2018-12-26 NOTE — Progress Notes (Signed)
Chief Complaint  Patient presents with  . Diabetes  This visit occurred during the SARS-CoV-2 public health emergency.  Safety protocols were in place, including screening questions prior to the visit, additional usage of staff PPE, and extensive cleaning of exam room while observing appropriate contact time as indicated for disinfecting solutions.     HPI: Morgan [name] 68 y.o. come in for Chronic disease management   Fu dm  Tried rebels [place]  7 mg and had se and sugars went more out of control   So went back on victoza and here to disc other other options   Eye in September   Check ok  Need better glasses  No falling  New sx hard to lose weight   No uti sx today  Insurance will pay for 1 mg  ozempic         ROS: See pertinent positives and negatives per HPI.  Past Medical History:  Diagnosis Date  . Anemia    as a child   . CAD, NATIVE VESSEL 05/13/2009  . Cataracts, bilateral    MD just watching  . Complication of anesthesia   . DIABETES MELLITUS, TYPE II 12/09/2009  . Gastric ulcer   . Gastritis   . GERD (gastroesophageal reflux disease)   . Hx of abnormal cervical Pap smear    cryo  but normal since then   . HYPERLIPIDEMIA-MIXED 04/14/2008  . Intestinal metaplasia of gastric mucosa 2012  . Myocardial infarction ([place])    2003  . Obesity   . Occult blood in stools   . Osteopenia   . OVERWEIGHT/OBESITY 05/13/2009  . Ulcer    Duodenal ulcer by x-ray 40 years ago  . Varicose veins    under rx [place] 2016    Family History  Problem Relation Age of Onset  . Emphysema Mother        Smoker  . Heart attack Mother        due to head injury  . Alcohol abuse Mother   . Heart disease Mother   . Early death Mother   . Varicose Veins Mother   . AAA (abdominal aortic aneurysm) Mother   . Lung cancer Father   . Heart attack Father   . Hypertension Father   . Alcohol abuse Father   . Heart disease Father        before age 66  . Aneurysm Father   . Hyperlipidemia  Father   . Alcohol abuse Brother   . Diabetes Brother   . Heart disease Brother   . Heart attack Brother   . Diabetes Brother   . Aneurysm Paternal Grandmother   . Diabetes Other        grandfather  . Sudden Cardiac Death Brother   . Colon cancer Neg Hx   . Esophageal cancer Neg Hx   . Stomach cancer Neg Hx   . Inflammatory bowel disease Neg Hx   . Liver disease Neg Hx   . Pancreatic cancer Neg Hx   . Rectal cancer Neg Hx     Social History   Socioeconomic History  . Marital status: Widowed    Spouse name: Not on file  . Number of children: 2  . Years of education: phd   . Highest education level: Not on file  Occupational History  . Occupation: Mental Health Counselor    Comment: [employer]  . Occupation: Nurse, adult: [place]  .  Financial resource strain: Not on file  . Food insecurity    Worry: Not on file    Inability: Not on file  . Transportation needs    Medical: Not on file    Non-medical: Not on file  Tobacco Use  . Smoking status: Former Smoker    Types: Cigarettes    Quit date: 02/05/1994    Years since quitting: 24.9  . Smokeless tobacco: Never Used  Substance and Sexual Activity  . Alcohol use: No    Alcohol/week: 0.0 standard drinks  . Drug use: No  . Sexual activity: Not on file  Lifestyle  . Physical activity    Days per week: Not on file    Minutes per session: Not on file  . Stress: Not on file  Relationships  . Social Herbalist on phone: Not on file    Gets together: Not on file    Attends religious service: Not on file    Active member of club or organization: Not on file    Attends meetings of clubs or organizations: Not on file    Relationship status: Not on file  Other Topics Concern  . 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    No firearms    NO reg exercise    Pets- Poodles 2    Sleep 7 hours     Outpatient Medications Prior to Visit  Medication Sig Dispense Refill  . acetaminophen (TYLENOL) 500 MG tablet Take 1,000 mg by mouth every 6 (six) hours as needed for moderate pain or headache.    Marland Kitchen aspirin 81 MG tablet Take 81 mg by mouth daily.      Marland Kitchen atorvastatin (LIPITOR) 80 MG tablet TAKE 1 TABLET BY MOUTH EVERY DAY AT 6 pm (Patient taking differently: Take 80 mg by mouth daily. ) 90 tablet 1  . canagliflozin (INVOKANA) 300 MG TABS tablet TAKE 1 TABLET BY MOUTH EVERY DAY BEFORE BREAKFAST (Patient taking differently: Take 300 mg by mouth daily before breakfast. ) 90 tablet 1  . Cholecalciferol (VITAMIN D3) 125 MCG (5000 UT) CAPS Take 5,000 Units by mouth daily.    . clindamycin (CLEOCIN T) 1 % lotion Apply 1 application topically daily.    . Cranberry-Vitamin C-Vitamin E (CRANBERRY PLUS VITAMIN C) 4200-20-3 MG-MG-UNIT CAPS Take 2 capsules by mouth daily.    . Cyanocobalamin (B-12 PO) Take 2 tablets by mouth daily. 1000 mcg per    . ezetimibe (ZETIA) 10 MG tablet TAKE 1 TABLET BY MOUTH EVERY DAY (Patient taking differently: Take 10 mg by mouth daily. ) 90 tablet 1  . Ferrous Sulfate (IRON) 325 (65 Fe) MG TABS Take 650 mg by mouth daily.     Marland Kitchen glucose blood (ONE TOUCH ULTRA TEST) test strip One Touch Ultra:  Test blood glucose 2-3 times a day. 100 each 12  . Insulin Pen Needle (B-D UF III MINI PEN NEEDLES) 31G X 5 MM MISC Inject 1.8mg  under the skin daily. 300 each 3  . Lancet Devices (ONE TOUCH DELICA LANCING DEV) MISC Use as directed to check blood sugars twice to four time per day. Dx E11.9 1 each  11  . Lancets (ONETOUCH DELICA PLUS 123XX123) MISC CHECK BLOOD SUGAR 2 TIMES DAILY 100 each 2  . liraglutide (VICTOZA) 18 MG/3ML SOPN INJECT 1.8 MG UNDER THE SKIN DAILY (Patient taking differently: Inject 1.8 mg into the skin daily. ) 27 mL 1  . metFORMIN  (GLUCOPHAGE-XR) 500 MG 24 hr tablet TAKE 2 TABLETS BY MOUTH 2 TIMES DAILY WITH A MEAL 360 tablet 1  . Multiple Vitamins-Minerals (MULTIVITAMIN PO) Take 1 tablet by mouth daily.     . Omega-3 Fatty Acids (FISH OIL) 1000 MG CAPS Take 2,000 mg by mouth daily.    Marland Kitchen omeprazole (PRILOSEC) 40 MG capsule Take 1 capsule (40 mg total) by mouth 2 (two) times daily. 180 capsule 0  . Semaglutide (RYBELSUS) 7 MG TABS Take 1 tablet by mouth daily. 90 tablet 2   No facility-administered medications prior to visit.      EXAM:  BP 124/62 (BP Location: Right Arm, Patient Position: Sitting, Cuff Size: Normal)   Pulse 65   Temp 97.6 F (36.4 C) (Temporal)   Ht 5\' 5"  (1.651 m)   Wt 170 lb 6.4 oz (77.3 kg)   LMP  (LMP Unknown)   SpO2 97%   BMI 28.36 kg/m   Body mass index is 28.36 kg/m.  GENERAL: vitals reviewed and listed above, alert, oriented, appears well hydrated and in no acute distress HEENT: atraumatic, conjunctiva  clear, no obvious abnormalities on inspection of external nose and ears OP :masked   NECK: no obvious masses on inspection palpation  LUNGS: clear to auscultation bilaterally, no wheezes, rales or rhonchi, good air movement CV: HRRR, no clubbing cyanosis or  peripheral edema nl cap refill  MS: moves all extremities without noticeable focal  abnormality PSYCH: pleasant and cooperative, no obvious depression or anxiety  Diabetic Foot Exam - Simple   Simple Foot Form Diabetic Foot exam was performed with the following findings: Yes 12/26/2018  9:17 AM  Visual Inspection No deformities, no ulcerations, no other skin breakdown bilaterally: Yes Sensation Testing Intact to touch and monofilament testing bilaterally: Yes Pulse Check Posterior Tibialis and Dorsalis pulse intact bilaterally: Yes Comments     Lab Results  Component Value Date   WBC 6.2 08/01/2018   HGB 14.7 08/01/2018   HCT 44.0 08/01/2018   PLT 215.0 08/01/2018   GLUCOSE 149 (H) 08/01/2018   CHOL 132  08/01/2018   TRIG 69.0 08/01/2018   HDL 44.90 08/01/2018   LDLCALC 74 08/01/2018   ALT 21 08/01/2018   AST 15 08/01/2018   NA 137 08/01/2018   K 4.2 08/01/2018   CL 99 08/01/2018   CREATININE 0.72 08/01/2018   BUN 11 08/01/2018   CO2 28 08/01/2018   TSH 2.84 09/20/2017   INR 0.9 01/13/2018   HGBA1C 8.0 (A) 12/26/2018   MICROALBUR <0.7 08/01/2018   BP Readings from Last 3 Encounters:  12/26/18 124/62  10/03/18 120/80  09/29/18 103/62    ASSESSMENT AND PLAN:  Discussed the following assessment and plan:  Diabetes mellitus without complication (HCC) - dec control pt aware  change to ozempic  1 mg weekly - Plan: POC HgB A1c  Medication side effect - and ineffective rebelsus  Plan change to oze,p[ic  And then rov in 3-4 mos or as indication   -Patient advised to return or notify health care team  if  new concerns arise. Return in about 4 months (around 04/25/2019) for a1c fu.  Patient Instructions  Sending in ozempic  Weekly injection.  Standley Brooking. Jaeveon Ashland M.D.

## 2019-01-07 ENCOUNTER — Telehealth: Payer: PPO | Admitting: Internal Medicine

## 2019-01-31 ENCOUNTER — Other Ambulatory Visit: Payer: Self-pay | Admitting: Internal Medicine

## 2019-02-14 ENCOUNTER — Other Ambulatory Visit: Payer: Self-pay | Admitting: Internal Medicine

## 2019-02-17 MED ORDER — OZEMPIC (1 MG/DOSE) 2 MG/1.5ML ~~LOC~~ SOPN: 1.0000 mg | PEN_INJECTOR | SUBCUTANEOUS | 1 refills | Status: DC

## 2019-02-17 NOTE — Addendum Note (Signed)
Addended by: Modena Morrow R on: 02/17/2019 04:11 PM   Modules accepted: Orders

## 2019-02-17 NOTE — Telephone Encounter (Signed)
Please send in 90 days refill x 3  As she requestes

## 2019-03-05 ENCOUNTER — Ambulatory Visit: Payer: PPO

## 2019-03-14 ENCOUNTER — Ambulatory Visit: Payer: PPO | Attending: Internal Medicine

## 2019-03-14 DIAGNOSIS — Z23 Encounter for immunization: Secondary | ICD-10-CM | POA: Insufficient documentation

## 2019-03-14 NOTE — Progress Notes (Signed)
   Covid-19 Vaccination Clinic  Name:  Morgan [name]    MRN: [id number] DOB: 12-16-1950  03/14/2019  [name] was observed post Covid-19 immunization for 15 minutes without incidence. She was provided with Vaccine Information Sheet and instruction to access the V-Safe system.   [name] was instructed to call 911 with any severe reactions post vaccine: Marland Kitchen Difficulty breathing  . Swelling of your face and throat  . A fast heartbeat  . A bad rash all over your body  . Dizziness and weakness    Immunizations Administered    Name Date Dose VIS Date Route   Pfizer COVID-19 Vaccine 03/14/2019 11:45 AM 0.3 mL 01/16/2019 Intramuscular   Manufacturer: [place]   Lot: [id number]   [place]: [id number]

## 2019-03-20 ENCOUNTER — Other Ambulatory Visit: Payer: Self-pay | Admitting: Internal Medicine

## 2019-03-20 ENCOUNTER — Other Ambulatory Visit: Payer: Self-pay | Admitting: Cardiology

## 2019-03-22 ENCOUNTER — Ambulatory Visit: Payer: PPO

## 2019-04-06 ENCOUNTER — Ambulatory Visit: Payer: PPO | Attending: Internal Medicine

## 2019-04-06 DIAGNOSIS — Z23 Encounter for immunization: Secondary | ICD-10-CM | POA: Insufficient documentation

## 2019-04-06 NOTE — Progress Notes (Signed)
   Covid-19 Vaccination Clinic  Name:  Morgan [name]    MRN: [id number] DOB: [birth year]  04/06/2019  [name] was observed post Covid-19 immunization for 15 minutes without incidence. She was provided with Vaccine Information Sheet and instruction to access the V-Safe system.   [name] was instructed to call 911 with any severe reactions post vaccine: Marland Kitchen Difficulty breathing  . Swelling of your face and throat  . A fast heartbeat  . A bad rash all over your body  . Dizziness and weakness    Immunizations Administered    Name Date Dose VIS Date Route   Pfizer COVID-19 Vaccine 04/06/2019  8:15 AM 0.3 mL 01/16/2019 Intramuscular   Manufacturer: [place]   Lot: [id number]   [place]: [id number]

## 2019-04-23 ENCOUNTER — Other Ambulatory Visit: Payer: Self-pay

## 2019-04-24 ENCOUNTER — Ambulatory Visit (INDEPENDENT_AMBULATORY_CARE_PROVIDER_SITE_OTHER): Payer: PPO | Admitting: Internal Medicine

## 2019-04-24 ENCOUNTER — Encounter: Payer: Self-pay | Admitting: Internal Medicine

## 2019-04-24 ENCOUNTER — Ambulatory Visit (INDEPENDENT_AMBULATORY_CARE_PROVIDER_SITE_OTHER): Payer: PPO

## 2019-04-24 ENCOUNTER — Telehealth: Payer: Self-pay | Admitting: Internal Medicine

## 2019-04-24 VITALS — BP 110/60 | HR 67 | Temp 97.4°F | Ht 65.0 in | Wt 160.0 lb

## 2019-04-24 DIAGNOSIS — Z79899 Other long term (current) drug therapy: Secondary | ICD-10-CM | POA: Diagnosis not present

## 2019-04-24 DIAGNOSIS — E785 Hyperlipidemia, unspecified: Secondary | ICD-10-CM

## 2019-04-24 DIAGNOSIS — E119 Type 2 diabetes mellitus without complications: Secondary | ICD-10-CM

## 2019-04-24 DIAGNOSIS — M954 Acquired deformity of chest and rib: Secondary | ICD-10-CM | POA: Diagnosis not present

## 2019-04-24 DIAGNOSIS — I2510 Atherosclerotic heart disease of native coronary artery without angina pectoris: Secondary | ICD-10-CM

## 2019-04-24 LAB — POCT GLYCOSYLATED HEMOGLOBIN (HGB A1C): Hemoglobin A1C: 7.4 % — AB (ref 4.0–5.6)

## 2019-04-24 MED ORDER — OZEMPIC (1 MG/DOSE) 2 MG/1.5ML ~~LOC~~ SOPN: 1.0000 mg | PEN_INJECTOR | SUBCUTANEOUS | 5 refills | Status: DC

## 2019-04-24 NOTE — Patient Instructions (Signed)
Continue  ozempic   Plan cpx and labs in 3-4 months   I can place lab orders pre visit if you chose.  X ray to day of sternum

## 2019-04-24 NOTE — Progress Notes (Signed)
As we suspected  bump probably a bony spur  that can be seen with    degenerative arthritis type changes and no concerns     observe and follow

## 2019-04-24 NOTE — Progress Notes (Signed)
Chief Complaint  Patient presents with  . Diabetes    4 month f/u     HPI: Morgan [name] 69 y.o. come in for Chronic disease management   DM Likes ozempic  And bg is getting better   Got  Immunization  covid #2 had reaction 2-3 days later   Felt tired  And weak.   10 days  Fatigue   And a week later . Then  Felt better.  Right away. Now better today  Mood a bit down with isolation at this time but planning trips to family Working telvisits.  Developed bump upper sternum without sx noted over past few monts  No trauma  ROS: See pertinent positives and negatives per HPI.  Past Medical History:  Diagnosis Date  . Anemia    as a child   . CAD, NATIVE VESSEL 05/13/2009  . Cataracts, bilateral    MD just watching  . Complication of anesthesia   . DIABETES MELLITUS, TYPE II 12/09/2009  . Gastric ulcer   . Gastritis   . GERD (gastroesophageal reflux disease)   . Hx of abnormal cervical Pap smear    cryo  but normal since then   . HYPERLIPIDEMIA-MIXED 04/14/2008  . Intestinal metaplasia of gastric mucosa 2012  . Myocardial infarction ([place])    2003  . Obesity   . Occult blood in stools   . Osteopenia   . OVERWEIGHT/OBESITY 05/13/2009  . Ulcer    Duodenal ulcer by x-ray 40 years ago  . Varicose veins    under rx [facility] 2016    Family History  Problem Relation Age of Onset  . Emphysema Mother        Smoker  . Heart attack Mother        due to head injury  . Alcohol abuse Mother   . Heart disease Mother   . Early death Mother   . Varicose Veins Mother   . AAA (abdominal aortic aneurysm) Mother   . Lung cancer Father   . Heart attack Father   . Hypertension Father   . Alcohol abuse Father   . Heart disease Father        before age 53  . Aneurysm Father   . Hyperlipidemia Father   . Alcohol abuse Brother   . Diabetes Brother   . Heart disease Brother   . Heart attack Brother   . Diabetes Brother   . Aneurysm Paternal Grandmother   . Diabetes Other    grandfather  . Sudden Cardiac Death Brother   . Colon cancer Neg Hx   . Esophageal cancer Neg Hx   . Stomach cancer Neg Hx   . Inflammatory bowel disease Neg Hx   . Liver disease Neg Hx   . Pancreatic cancer Neg Hx   . Rectal cancer Neg Hx     Social History   Socioeconomic History  . Marital status: Widowed    Spouse name: Not on file  . Number of children: 2  . Years of education: phd   . Highest education level: Not on file  Occupational History  . Occupation: Mental Health Counselor    Comment: [employer]  . Occupation: Nurse, adult: [employer]  Tobacco Use  . Smoking status: Former Smoker    Types: Cigarettes    Quit date: 02/05/1994    Years since quitting: 25.2  . Smokeless tobacco: Never Used  Substance and Sexual Activity  . Alcohol  use: No    Alcohol/week: 0.0 standard drinks  . Drug use: No  . Sexual activity: Not on file  Other Topics Concern  . 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    No firearms    NO reg exercise    Pets- Poodles 2    Sleep 7 hours    Social Determinants of Health   Financial Resource Strain:   . Difficulty of Paying Living Expenses:   Food Insecurity:   . Worried About Charity fundraiser in the Last Year:   . Arboriculturist in the Last Year:   Transportation Needs:   . Film/video editor (Medical):   Marland Kitchen Lack of Transportation (Non-Medical):   Physical Activity:   . Days of Exercise per Week:   . Minutes of Exercise per Session:   Stress:   . Feeling of Stress :   Social Connections:   . Frequency of Communication with Friends and Family:   . Frequency of Social Gatherings with Friends and Family:   . Attends Religious Services:   . Active Member of Clubs or Organizations:   .  Attends Archivist Meetings:   Marland Kitchen Marital Status:     Outpatient Medications Prior to Visit  Medication Sig Dispense Refill  . acetaminophen (TYLENOL) 500 MG tablet Take 1,000 mg by mouth every 6 (six) hours as needed for moderate pain or headache.    Marland Kitchen aspirin 81 MG tablet Take 81 mg by mouth daily.      Marland Kitchen atorvastatin (LIPITOR) 80 MG tablet TAKE 1 TABLET BY MOUTH EVERY DAY AT 6 pm 90 tablet 1  . Cholecalciferol (VITAMIN D3) 125 MCG (5000 UT) CAPS Take 5,000 Units by mouth daily.    . clindamycin (CLEOCIN T) 1 % lotion Apply 1 application topically daily.    . Cranberry-Vitamin C-Vitamin E (CRANBERRY PLUS VITAMIN C) 4200-20-3 MG-MG-UNIT CAPS Take 2 capsules by mouth daily.    . Cyanocobalamin (B-12 PO) Take 2 tablets by mouth daily. 1000 mcg per    . ezetimibe (ZETIA) 10 MG tablet TAKE 1 TABLET BY MOUTH EVERY DAY 90 tablet 0  . Ferrous Sulfate (IRON) 325 (65 Fe) MG TABS Take 650 mg by mouth daily.     Marland Kitchen glucose blood (ONE TOUCH ULTRA TEST) test strip One Touch Ultra:  Test blood glucose 2-3 times a day. 100 each 12  . INVOKANA 300 MG TABS tablet TAKE 1 TABLET BY MOUTH EVERY DAY BEFORE BREAKFAST 90 tablet 1  . Lancet Devices (ONE TOUCH DELICA LANCING DEV) MISC Use as directed to check blood sugars twice to four time per day. Dx E11.9 1 each 11  . Lancets (ONETOUCH DELICA PLUS 123XX123) MISC CHECK BLOOD SUGAR 2 TIMES DAILY 100 each 2  . metFORMIN (GLUCOPHAGE-XR) 500 MG 24 hr tablet TAKE 2 TABLETS BY MOUTH 2 TIMES DAILY WITH A MEAL 360 tablet 1  . Multiple Vitamins-Minerals (MULTIVITAMIN PO) Take 1 tablet by mouth daily.     . Omega-3 Fatty Acids (FISH OIL) 1000 MG CAPS Take 2,000 mg by mouth daily.    . Insulin Pen Needle (B-D UF III MINI PEN NEEDLES) 31G  X 5 MM MISC Inject 1.8mg  under the skin daily. 300 each 3  . liraglutide (VICTOZA) 18 MG/3ML SOPN INJECT 1.8 MG UNDER THE SKIN DAILY (Patient taking differently: Inject 1.8 mg into the skin daily. ) 27 mL 1  . Semaglutide, 1  MG/DOSE, (OZEMPIC, 1 MG/DOSE,) 2 MG/1.5ML SOPN Inject 1 mg into the skin once a week. 12 mL 1  . omeprazole (PRILOSEC) 40 MG capsule Take 1 capsule (40 mg total) by mouth 2 (two) times daily. 180 capsule 0   No facility-administered medications prior to visit.     EXAM:  BP 110/60   Pulse 67   Temp (!) 97.4 F (36.3 C) (Other (Comment))   Ht 5\' 5"  (1.651 m)   Wt 160 lb (72.6 kg)   LMP  (LMP Unknown)   SpO2 98%   BMI 26.63 kg/m   Body mass index is 26.63 kg/m.  GENERAL: vitals reviewed and listed above, alert, oriented, appears well hydrated and in no acute distress HEENT: atraumatic, conjunctiva  clear, no obvious abnormalities on inspection of external nose and ears OP : masked  NECK: no obvious masses on inspection palpation  LUNGS: clear to auscultation bilaterally, no wheezes, rales or rhonchi, good air movement Sternal upper  Mid to left  Bony prominence without tenderness or skin changes  CV: HRRR, no clubbing cyanosis or  peripheral edema nl cap refill  MS: moves all extremities without noticeable focal  abnormality PSYCH: pleasant and cooperative, no obvious depression or anxiety Lab Results  Component Value Date   WBC 6.2 08/01/2018   HGB 14.7 08/01/2018   HCT 44.0 08/01/2018   PLT 215.0 08/01/2018   GLUCOSE 149 (H) 08/01/2018   CHOL 132 08/01/2018   TRIG 69.0 08/01/2018   HDL 44.90 08/01/2018   LDLCALC 74 08/01/2018   ALT 21 08/01/2018   AST 15 08/01/2018   NA 137 08/01/2018   K 4.2 08/01/2018   CL 99 08/01/2018   CREATININE 0.72 08/01/2018   BUN 11 08/01/2018   CO2 28 08/01/2018   TSH 2.84 09/20/2017   INR 0.9 01/13/2018   HGBA1C 7.4 (A) 04/24/2019   MICROALBUR <0.7 08/01/2018   BP Readings from Last 3 Encounters:  04/24/19 110/60  12/26/18 124/62  10/03/18 120/80    ASSESSMENT AND PLAN:  Discussed the following assessment and plan:  Diabetes mellitus without complication ([place]) - Plan: POC HgB 123456, Basic metabolic panel, CBC with  Differential/Platelet, Hemoglobin A1c, Hepatic function panel, Lipid panel, TSH, Microalbumin / creatinine urine ratio  Sternal deformity - Plan: DG Sternum, Basic metabolic panel, CBC with Differential/Platelet, Hemoglobin A1c, Hepatic function panel, Lipid panel, TSH, Microalbumin / creatinine urine ratio  Medication management - Plan: Basic metabolic panel, CBC with Differential/Platelet, Hemoglobin A1c, Hepatic function panel, Lipid panel, TSH, Microalbumin / creatinine urine ratio  Hyperlipidemia, unspecified hyperlipidemia type - Plan: Basic metabolic panel, CBC with Differential/Platelet, Hemoglobin A1c, Hepatic function panel, Lipid panel, TSH, Microalbumin / creatinine urine ratio  Atherosclerosis of native coronary artery of native heart without angina pectoris - Plan: Basic metabolic panel, CBC with Differential/Platelet, Hemoglobin A1c, Hepatic function panel, Lipid panel, TSH, Microalbumin / creatinine urine ratio Doing much better on ozempic and will continue  Check x ray  prob osteophyte or such observe  Etc  Otherwise   Plan cpx and labs at or before visit in  The fall  -Patient advised to return or notify health care team  if  new concerns arise. In interim  Return for 3-4 months ,  preventive /cpx and medications and labs . 32 minutes record datat  review  Med eval visit and counseling   Patient Instructions  Continue  ozempic   Plan cpx and labs in 3-4 months   I can place lab orders pre visit if you chose.  X ray to day of sternum     Standley Brooking. Margee Trentham M.D.

## 2019-04-24 NOTE — Progress Notes (Signed)
  Chronic Care Management   Outreach Note  04/24/2019 Name: Morgan [name] MRN: [id number] DOB: [birth year]  Referred by: Burnis Medin, MD Reason for referral : No chief complaint on file.   An unsuccessful telephone outreach was attempted today. The patient was referred to the pharmacist for assistance with care management and care coordination.   Follow Up Plan:   Raynicia Dukes [employer] Scheduler

## 2019-04-28 ENCOUNTER — Telehealth: Payer: Self-pay | Admitting: Internal Medicine

## 2019-04-28 NOTE — Progress Notes (Signed)
  Chronic Care Management   Outreach Note  04/28/2019 Name: CECILY STPETER MRN: [id number] DOB: [birth year]  Referred by: Burnis Medin, MD Reason for referral : No chief complaint on file.   A second unsuccessful telephone outreach was attempted today. The patient was referred to pharmacist for assistance with care management and care coordination.  Follow Up Plan:   Raynicia Dukes [employer] Scheduler

## 2019-04-30 ENCOUNTER — Other Ambulatory Visit: Payer: Self-pay | Admitting: Internal Medicine

## 2019-04-30 MED ORDER — OMEPRAZOLE 40 MG PO CPDR: 40.0000 mg | DELAYED_RELEASE_CAPSULE | Freq: Two times a day (BID) | ORAL | 0 refills | Status: DC

## 2019-05-06 ENCOUNTER — Other Ambulatory Visit: Payer: Self-pay

## 2019-05-06 NOTE — Progress Notes (Signed)
HPI: FU CAD. Previous PCI of her RCA following myocardial infarction in 2003. Echocardiogram in 2004 showed normal LV function. Abd ultrasound 11/15 showed no aneurysm. Nuclear study 9/16 showed EF 57 and inability to evaluate inferior wall but otherwise normal perfusion.Carotid Dopplers October 2017 showed 1-39% stenosis. There is note of abnormal thyroid tissue and dedicated thyroid ultrasound suggested. Thyroid ultrasound November 2017 showed borderline enlargement with moderately heterogeneous thyroid with no discrete nodule.Since last seen, the patient denies any dyspnea on exertion, orthopnea, PND, pedal edema, palpitations, syncope or chest pain.   Current Outpatient Medications  Medication Sig Dispense Refill  . acetaminophen (TYLENOL) 500 MG tablet Take 1,000 mg by mouth every 6 (six) hours as needed for moderate pain or headache.    Marland Kitchen aspirin 81 MG tablet Take 81 mg by mouth daily.      Marland Kitchen atorvastatin (LIPITOR) 80 MG tablet TAKE 1 TABLET BY MOUTH EVERY DAY AT 6 pm 90 tablet 1  . Cholecalciferol (VITAMIN D3) 125 MCG (5000 UT) CAPS Take 5,000 Units by mouth daily.    . clindamycin (CLEOCIN T) 1 % lotion Apply 1 application topically daily.    . Cranberry-Vitamin C-Vitamin E (CRANBERRY PLUS VITAMIN C) 4200-20-3 MG-MG-UNIT CAPS Take 2 capsules by mouth daily.    . Cyanocobalamin (B-12 PO) Take 2 tablets by mouth daily. 1000 mcg per    . ezetimibe (ZETIA) 10 MG tablet TAKE 1 TABLET BY MOUTH EVERY DAY 90 tablet 0  . Ferrous Sulfate (IRON) 325 (65 Fe) MG TABS Take 650 mg by mouth daily.     Marland Kitchen glucose blood (ONE TOUCH ULTRA TEST) test strip One Touch Ultra:  Test blood glucose 2-3 times a day. 100 each 12  . INVOKANA 300 MG TABS tablet TAKE 1 TABLET BY MOUTH EVERY DAY BEFORE BREAKFAST 90 tablet 1  . Lancet Devices (ONE TOUCH DELICA LANCING DEV) MISC Use as directed to check blood sugars twice to four time per day. Dx E11.9 1 each 11  . Lancets (ONETOUCH DELICA PLUS 123XX123) MISC  CHECK BLOOD SUGAR 2 TIMES DAILY 100 each 2  . metFORMIN (GLUCOPHAGE-XR) 500 MG 24 hr tablet TAKE 2 TABLETS BY MOUTH 2 TIMES DAILY WITH A MEAL 360 tablet 1  . Multiple Vitamins-Minerals (MULTIVITAMIN PO) Take 1 tablet by mouth daily.     . Omega-3 Fatty Acids (FISH OIL) 1000 MG CAPS Take 2,000 mg by mouth daily.    Marland Kitchen omeprazole (PRILOSEC) 40 MG capsule Take 1 capsule (40 mg total) by mouth 2 (two) times daily. 180 capsule 0  . Semaglutide, 1 MG/DOSE, (OZEMPIC, 1 MG/DOSE,) 2 MG/1.5ML SOPN Inject 1 mg into the skin once a week. 12 mL 5   No current facility-administered medications for this visit.     Past Medical History:  Diagnosis Date  . Anemia    as a child   . CAD, NATIVE VESSEL 05/13/2009  . Cataracts, bilateral    MD just watching  . Complication of anesthesia   . DIABETES MELLITUS, TYPE II 12/09/2009  . Gastric ulcer   . Gastritis   . GERD (gastroesophageal reflux disease)   . Hx of abnormal cervical Pap smear    cryo  but normal since then   . HYPERLIPIDEMIA-MIXED 04/14/2008  . Intestinal metaplasia of gastric mucosa 2012  . Myocardial infarction ([place])    2003  . Obesity   . Occult blood in stools   . Osteopenia   . OVERWEIGHT/OBESITY 05/13/2009  . Ulcer  Duodenal ulcer by x-ray 40 years ago  . Varicose veins    under rx [facility] 2016    Past Surgical History:  Procedure Laterality Date  . BIOPSY  09/29/2018   Procedure: BIOPSY;  Surgeon: Rush Landmark Telford Nab., MD;  Location: [facility];  Service: Gastroenterology;;  . CESAREAN SECTION     x2  . CLOSED REDUCTION PROXIMAL TIBIOFIBULAR JOINT [place]  2004   left  . COLONOSCOPY    . COLONOSCOPY WITH PROPOFOL N/A 09/29/2018   Procedure: COLONOSCOPY WITH PROPOFOL;  Surgeon: Rush Landmark Telford Nab., MD;  Location: [facility];  Service: Gastroenterology;  Laterality: N/A;  . DILATION AND CURETTAGE OF UTERUS    . ECTOPIC PREGNANCY SURGERY    . ESOPHAGOGASTRODUODENOSCOPY N/A 02/12/2018   Procedure:  ESOPHAGOGASTRODUODENOSCOPY (EGD);  Surgeon: Irving Copas., MD;  Location: [facility];  Service: Gastroenterology;  Laterality: N/A;  . ESOPHAGOGASTRODUODENOSCOPY N/A 09/29/2018   Procedure: ESOPHAGOGASTRODUODENOSCOPY (EGD);  Surgeon: Irving Copas., MD;  Location: [facility];  Service: Gastroenterology;  Laterality: N/A;  . EUS N/A 02/12/2018   Procedure: UPPER ENDOSCOPIC ULTRASOUND (EUS) RADIAL;  Surgeon: Rush Landmark Telford Nab., MD;  Location: [facility];  Service: Gastroenterology;  Laterality: N/A;  . FIBULA FRACTURE SURGERY    . GANGLION CYST EXCISION    . POLYPECTOMY  02/12/2018   Procedure: POLYPECTOMY;  Surgeon: Mansouraty, Telford Nab., MD;  Location: [facility];  Service: Gastroenterology;;  . TONSILLECTOMY    . TUBAL LIGATION     One Tube  . UPPER GASTROINTESTINAL ENDOSCOPY     08/27/17    Social History   Socioeconomic History  . Marital status: Widowed    Spouse name: Not on file  . Number of children: 2  . Years of education: phd   . Highest education level: Not on file  Occupational History  . Occupation: Mental Health Counselor    Comment: [employer]  . Occupation: Nurse, adult: [employer]  Tobacco Use  . Smoking status: Former Smoker    Types: Cigarettes    Quit date: 02/05/1994    Years since quitting: 25.2  . Smokeless tobacco: Never Used  Substance and Sexual Activity  . Alcohol use: No    Alcohol/week: 0.0 standard drinks  . Drug use: No  . Sexual activity: Not on file  Other Topics Concern  . 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    No firearms    NO reg exercise    Pets- Poodles 2    Sleep 7 hours    Social Determinants of Health   Financial Resource Strain:   . Difficulty of Paying  Living Expenses:   Food Insecurity:   . Worried About Charity fundraiser in the Last Year:   . Arboriculturist in the Last Year:   Transportation Needs:   . Film/video editor (Medical):   Marland Kitchen Lack of Transportation (Non-Medical):   Physical Activity:   . Days of Exercise per Week:   . Minutes of Exercise per Session:   Stress:   . Feeling of Stress :   Social Connections:   . Frequency of Communication with Friends and  Family:   . Frequency of Social Gatherings with Friends and Family:   . Attends Religious Services:   . Active Member of Clubs or Organizations:   . Attends Archivist Meetings:   Marland Kitchen Marital Status:   Intimate Partner Violence:   . Fear of Current or Ex-Partner:   . Emotionally Abused:   Marland Kitchen Physically Abused:   . Sexually Abused:     Family History  Problem Relation Age of Onset  . Emphysema Mother        Smoker  . Heart attack Mother        due to head injury  . Alcohol abuse Mother   . Heart disease Mother   . Early death Mother   . Varicose Veins Mother   . AAA (abdominal aortic aneurysm) Mother   . Lung cancer Father   . Heart attack Father   . Hypertension Father   . Alcohol abuse Father   . Heart disease Father        before age 6  . Aneurysm Father   . Hyperlipidemia Father   . Alcohol abuse Brother   . Diabetes Brother   . Heart disease Brother   . Heart attack Brother   . Diabetes Brother   . Aneurysm Paternal Grandmother   . Diabetes Other        grandfather  . Sudden Cardiac Death Brother   . Colon cancer Neg Hx   . Esophageal cancer Neg Hx   . Stomach cancer Neg Hx   . Inflammatory bowel disease Neg Hx   . Liver disease Neg Hx   . Pancreatic cancer Neg Hx   . Rectal cancer Neg Hx     ROS: no fevers or chills, productive cough, hemoptysis, dysphasia, odynophagia, melena, hematochezia, dysuria, hematuria, rash, seizure activity, orthopnea, PND, pedal edema, claudication. Remaining systems are negative.  Physical  Exam: Well-developed well-nourished in no acute distress.  Skin is warm and dry.  HEENT is normal.  Neck is supple.  Chest is clear to auscultation with normal expansion.  Cardiovascular exam is regular rate and rhythm.  Abdominal exam nontender or distended. No masses palpated. Extremities show no edema. neuro grossly intact  ECG-sinus rhythm with first-degree AV block, no ST changes.  Personally reviewed  A/P  1 coronary artery disease-no chest pain.  Plan to continue medical therapy with aspirin and statin.  2 hyperlipidemia-continue zetia and statin.  3 carotid artery disease-mild on most recent carotid Dopplers.  Kirk Ruths, MD

## 2019-05-08 ENCOUNTER — Ambulatory Visit (INDEPENDENT_AMBULATORY_CARE_PROVIDER_SITE_OTHER): Payer: PPO | Admitting: Cardiology

## 2019-05-08 ENCOUNTER — Encounter: Payer: Self-pay | Admitting: Cardiology

## 2019-05-08 ENCOUNTER — Other Ambulatory Visit: Payer: Self-pay

## 2019-05-08 VITALS — BP 114/64 | HR 69 | Ht 65.5 in | Wt 161.0 lb

## 2019-05-08 DIAGNOSIS — I493 Ventricular premature depolarization: Secondary | ICD-10-CM

## 2019-05-08 DIAGNOSIS — I2510 Atherosclerotic heart disease of native coronary artery without angina pectoris: Secondary | ICD-10-CM

## 2019-05-08 DIAGNOSIS — E7800 Pure hypercholesterolemia, unspecified: Secondary | ICD-10-CM | POA: Diagnosis not present

## 2019-05-08 NOTE — Patient Instructions (Signed)

## 2019-05-22 DIAGNOSIS — H52203 Unspecified astigmatism, bilateral: Secondary | ICD-10-CM | POA: Diagnosis not present

## 2019-05-22 DIAGNOSIS — H2513 Age-related nuclear cataract, bilateral: Secondary | ICD-10-CM | POA: Diagnosis not present

## 2019-05-22 DIAGNOSIS — E119 Type 2 diabetes mellitus without complications: Secondary | ICD-10-CM | POA: Diagnosis not present

## 2019-05-22 LAB — HM DIABETES EYE EXAM

## 2019-06-18 ENCOUNTER — Other Ambulatory Visit: Payer: Self-pay | Admitting: Cardiology

## 2019-07-10 ENCOUNTER — Other Ambulatory Visit (INDEPENDENT_AMBULATORY_CARE_PROVIDER_SITE_OTHER): Payer: PPO

## 2019-07-10 ENCOUNTER — Other Ambulatory Visit: Payer: Self-pay

## 2019-07-10 DIAGNOSIS — M954 Acquired deformity of chest and rib: Secondary | ICD-10-CM

## 2019-07-10 DIAGNOSIS — E785 Hyperlipidemia, unspecified: Secondary | ICD-10-CM | POA: Diagnosis not present

## 2019-07-10 DIAGNOSIS — I2510 Atherosclerotic heart disease of native coronary artery without angina pectoris: Secondary | ICD-10-CM | POA: Diagnosis not present

## 2019-07-10 DIAGNOSIS — E119 Type 2 diabetes mellitus without complications: Secondary | ICD-10-CM

## 2019-07-10 DIAGNOSIS — Z79899 Other long term (current) drug therapy: Secondary | ICD-10-CM | POA: Diagnosis not present

## 2019-07-10 LAB — CBC WITH DIFFERENTIAL/PLATELET
Basophils Absolute: 0 10*3/uL (ref 0.0–0.1)
Basophils Relative: 0.7 % (ref 0.0–3.0)
Eosinophils Absolute: 0 10*3/uL (ref 0.0–0.7)
Eosinophils Relative: 0.7 % (ref 0.0–5.0)
HCT: 42.8 % (ref 36.0–46.0)
Hemoglobin: 14.8 g/dL (ref 12.0–15.0)
Lymphocytes Relative: 31.7 % (ref 12.0–46.0)
Lymphs Abs: 1.8 10*3/uL (ref 0.7–4.0)
MCHC: 34.6 g/dL (ref 30.0–36.0)
MCV: 93.3 fl (ref 78.0–100.0)
Monocytes Absolute: 0.3 10*3/uL (ref 0.1–1.0)
Monocytes Relative: 6.2 % (ref 3.0–12.0)
Neutro Abs: 3.4 10*3/uL (ref 1.4–7.7)
Neutrophils Relative %: 60.7 % (ref 43.0–77.0)
Platelets: 190 10*3/uL (ref 150.0–400.0)
RBC: 4.59 Mil/uL (ref 3.87–5.11)
RDW: 13.4 % (ref 11.5–15.5)
WBC: 5.6 10*3/uL (ref 4.0–10.5)

## 2019-07-10 LAB — BASIC METABOLIC PANEL
BUN: 10 mg/dL (ref 6–23)
CO2: 29 mEq/L (ref 19–32)
Calcium: 9.8 mg/dL (ref 8.4–10.5)
Chloride: 98 mEq/L (ref 96–112)
Creatinine, Ser: 0.66 mg/dL (ref 0.40–1.20)
GFR: 88.99 mL/min (ref >60.00)
Glucose, Bld: 171 mg/dL — ABNORMAL HIGH (ref 70–99)
Potassium: 4.3 mEq/L (ref 3.5–5.1)
Sodium: 136 mEq/L (ref 135–145)

## 2019-07-10 LAB — TSH: TSH: 4.82 u[IU]/mL — ABNORMAL HIGH (ref 0.35–4.50)

## 2019-07-10 LAB — HEPATIC FUNCTION PANEL
ALT: 25 U/L (ref 0–35)
AST: 17 U/L (ref 0–37)
Albumin: 4.6 g/dL (ref 3.5–5.2)
Alkaline Phosphatase: 62 U/L (ref 39–117)
Bilirubin, Direct: 0.2 mg/dL (ref 0.0–0.3)
Total Bilirubin: 0.7 mg/dL (ref 0.2–1.2)
Total Protein: 6.3 g/dL (ref 6.0–8.3)

## 2019-07-10 LAB — LIPID PANEL
Cholesterol: 130 mg/dL (ref 0–200)
HDL: 44.8 mg/dL (ref >39.00)
LDL Cholesterol: 69 mg/dL (ref 0–99)
NonHDL: 85.02
Total CHOL/HDL Ratio: 3
Triglycerides: 78 mg/dL (ref 0.0–149.0)
VLDL: 15.6 mg/dL (ref 0.0–40.0)

## 2019-07-10 LAB — MICROALBUMIN / CREATININE URINE RATIO
Creatinine,U: 49.7 mg/dL
Microalb Creat Ratio: 3 mg/g (ref 0.0–30.0)
Microalb, Ur: 1.5 mg/dL (ref 0.0–1.9)

## 2019-07-10 LAB — HEMOGLOBIN A1C: Hgb A1c MFr Bld: 7.6 % — ABNORMAL HIGH (ref 4.6–6.5)

## 2019-07-13 NOTE — Progress Notes (Signed)
Results stable but thyroid is borderline abnormal  We should review and discuss  at your upcoming visit

## 2019-07-17 ENCOUNTER — Ambulatory Visit: Payer: PPO | Admitting: Internal Medicine

## 2019-07-23 ENCOUNTER — Other Ambulatory Visit: Payer: Self-pay

## 2019-07-23 NOTE — Progress Notes (Signed)
Chief Complaint  Patient presents with  . Annual Exam    Doing okay  . Medication Refill  . Diabetes    HPI: Morgan [name] 69 y.o. comes in today for Preventive Medicare exam/ wellness visit and  Chronic disease management  DM needs refill meds  Doing ok likes  the ozempic  bp: ok dry mouth for years  No snoring since better weight  And no sx of osa has small  Nasal passages   Health Maintenance  Topic Date Due  . INFLUENZA VACCINE  09/06/2019  . COLONOSCOPY  09/29/2019  . FOOT EXAM  12/26/2019  . HEMOGLOBIN A1C  01/09/2020  . MAMMOGRAM  03/12/2020  . OPHTHALMOLOGY EXAM  05/21/2020  . URINE MICROALBUMIN  07/09/2020  . TETANUS/TDAP  09/23/2024  . DEXA SCAN  Completed  . COVID-19 Vaccine  Completed  . Hepatitis C Screening  Completed  . PNA vac Low Risk Adult  Completed   Health Maintenance Review LIFESTYLE:  Exercise:  Jan not  Feeling up to this.  ? If could be from thyroid.  Tobacco/ETS: no Alcohol:  no Sugar beverages: no Sleep:varies  Up and down  Urination   interrupted 6-8   Drinks lots of water cause of dry mouth   Drug use: no HH: 2  Apt lost  Dog in feb   Plans on new puppy  Work   Hours  Depends  40 direct contact  Travels to see family   Uses prophyalxis  For dentistry .   [place]   Hearing: ok  Vision:  No limitations at present . Last eye check UTD  Safety:  Has smoke detector and wears seat belts.   No excess sun exposure. Sees dentist regularly.  Falls:  no  Advance directive :  Reviewed  Has one.  Memory: Felt to be good  , no concern from her or her family.  Depression: No anhedonia unusual crying or depressive symptoms  Nutrition: Eats well balanced diet; adequate calcium and vitamin D. No swallowing chewing problems.  Injury: no major injuries in the last six months.  Other healthcare providers:  Reviewed today .  Preventive parameters: up-to-date  Reviewed   ADLS:   There are no problems or need for assistance  driving,  feeding, obtaining food, dressing, toileting and bathing, managing money using phone. She is independent.    ROS:  GEN/ HEENT: No fever, significant weight changes sweats headaches vision problems hearing changes, CV/ PULM; No chest pain shortness of breath cough, syncope,edema  change in exercise tolerance. GI /GU: No adominal pain, vomiting, change in bowel habits. No blood in the stool.SKIN/HEME: ,no acute skin rashes suspicious lesions or bleeding. No lymphadenopathy, nodules, masses.  NEURO/ PSYCH:  No neurologic signs such as weakness numbness. No depression anxiety. IMM/ Allergy: No unusual infections.  Allergy .   REST of 12 system review negative except as per HPI   Past Medical History:  Diagnosis Date  . Anemia    as a child   . CAD, NATIVE VESSEL 05/13/2009  . Cataracts, bilateral    MD just watching  . Complication of anesthesia   . DIABETES MELLITUS, TYPE II 12/09/2009  . Gastric ulcer   . Gastritis   . GERD (gastroesophageal reflux disease)   . Hx of abnormal cervical Pap smear    cryo  but normal since then   . HYPERLIPIDEMIA-MIXED 04/14/2008  . Intestinal metaplasia of gastric mucosa 2012  . Myocardial infarction ([place])    2003  .  Obesity   . Occult blood in stools   . Osteopenia   . OVERWEIGHT/OBESITY 05/13/2009  . Ulcer    Duodenal ulcer by x-ray 40 years ago  . Varicose veins    under rx [facility] 2016    Family History  Problem Relation Age of Onset  . Emphysema Mother        Smoker  . Heart attack Mother        due to head injury  . Alcohol abuse Mother   . Heart disease Mother   . Early death Mother   . Varicose Veins Mother   . AAA (abdominal aortic aneurysm) Mother   . Lung cancer Father   . Heart attack Father   . Hypertension Father   . Alcohol abuse Father   . Heart disease Father        before age 63  . Aneurysm Father   . Hyperlipidemia Father   . Alcohol abuse Brother   . Diabetes Brother   . Heart disease Brother   . Heart attack  Brother   . Diabetes Brother   . Aneurysm Paternal Grandmother   . Diabetes Other        grandfather  . Sudden Cardiac Death Brother   . Colon cancer Neg Hx   . Esophageal cancer Neg Hx   . Stomach cancer Neg Hx   . Inflammatory bowel disease Neg Hx   . Liver disease Neg Hx   . Pancreatic cancer Neg Hx   . Rectal cancer Neg Hx     Social History   Socioeconomic History  . Marital status: Widowed    Spouse name: Not on file  . Number of children: 2  . Years of education: phd   . Highest education level: Not on file  Occupational History  . Occupation: Mental Health Counselor    Comment: [employer]  . Occupation: Nurse, adult: [employer]  Tobacco Use  . Smoking status: Former Smoker    Types: Cigarettes    Quit date: 02/05/1994    Years since quitting: 25.5  . Smokeless tobacco: Never Used  Vaping Use  . Vaping Use: Never used  Substance and Sexual Activity  . Alcohol use: No    Alcohol/week: 0.0 standard drinks  . Drug use: No  . Sexual activity: Not on file  Other Topics Concern  . 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 Self employed. PHD education fom [employer].    No firearms    NO reg exercise    Pets- Poodles 2    Sleep 7 hours    Social Determinants of Health   Financial Resource Strain:   . Difficulty of Paying Living Expenses:   Food Insecurity:   . Worried About Charity fundraiser in the Last Year:   . Arboriculturist in the Last Year:   Transportation Needs:   . Film/video editor (Medical):   Marland Kitchen Lack of Transportation (Non-Medical):   Physical Activity:   . Days of Exercise per Week:   . Minutes of Exercise per Session:   Stress:   . Feeling of Stress :   Social Connections:   .  Frequency of Communication with Friends and Family:   . Frequency  of Social Gatherings with Friends and Family:   . Attends Religious Services:   . Active Member of Clubs or Organizations:   . Attends Archivist Meetings:   Marland Kitchen Marital Status:     Outpatient Encounter Medications as of 07/24/2019  Medication Sig  . acetaminophen (TYLENOL) 500 MG tablet Take 1,000 mg by mouth every 6 (six) hours as needed for moderate pain or headache.  Marland Kitchen aspirin 81 MG tablet Take 81 mg by mouth daily.    Marland Kitchen atorvastatin (LIPITOR) 80 MG tablet TAKE 1 TABLET BY MOUTH EVERY DAY AT 6 pm  . canagliflozin (INVOKANA) 300 MG TABS tablet TAKE 1 TABLET BY MOUTH EVERY DAY BEFORE BREAKFAST  . Cholecalciferol (VITAMIN D3) 125 MCG (5000 UT) CAPS Take 5,000 Units by mouth daily.  . clindamycin (CLEOCIN T) 1 % lotion Apply 1 application topically daily.  . clindamycin (CLEOCIN) 300 MG capsule TAKE 2 CAPSULES BY MOUTH 1 HOUR PRIOR TO DENTAL APPOINTMENT & 2 CAPSULES IMMEDIATELY FOLLOWING APPOINTMENT  . Cranberry-Vitamin C-Vitamin E (CRANBERRY PLUS VITAMIN C) 4200-20-3 MG-MG-UNIT CAPS Take 2 capsules by mouth daily.  . Cyanocobalamin (B-12 PO) Take 2 tablets by mouth daily. 1000 mcg per  . ezetimibe (ZETIA) 10 MG tablet TAKE 1 TABLET BY MOUTH EVERY DAY  . Ferrous Sulfate (IRON) 325 (65 Fe) MG TABS Take 650 mg by mouth daily.   Marland Kitchen glucose blood (ONE TOUCH ULTRA TEST) test strip One Touch Ultra:  Test blood glucose 2-3 times a day.  Elmore Guise Devices (ONE TOUCH DELICA LANCING DEV) MISC Use as directed to check blood sugars twice to four time per day. Dx E11.9  . Lancets (ONETOUCH DELICA PLUS TSVXBL39Q) MISC CHECK BLOOD SUGAR 2 TIMES DAILY  . metFORMIN (GLUCOPHAGE-XR) 500 MG 24 hr tablet TAKE 2 TABLETS BY MOUTH 2 TIMES DAILY WITH A MEAL  . Multiple Vitamins-Minerals (MULTIVITAMIN PO) Take 1 tablet by mouth daily.   . Omega-3 Fatty Acids (FISH OIL) 1000 MG CAPS Take 2,000 mg by mouth daily.  Marland Kitchen omeprazole (PRILOSEC) 40 MG capsule Take 1 capsule (40 mg total) by mouth 2 (two) times daily.  .  Semaglutide, 1 MG/DOSE, (OZEMPIC, 1 MG/DOSE,) 2 MG/1.5ML SOPN Inject 0.75 mLs (1 mg total) into the skin once a week.  . [DISCONTINUED] clindamycin (CLEOCIN) 300 MG capsule TAKE 2 CAPSULES BY MOUTH 1 HOUR PRIOR TO DENTAL APPOINTMENT & 2 CAPSULES IMMEDIATELY FOLLOWING APPOINTMENT  . [DISCONTINUED] INVOKANA 300 MG TABS tablet TAKE 1 TABLET BY MOUTH EVERY DAY BEFORE BREAKFAST  . [DISCONTINUED] metFORMIN (GLUCOPHAGE-XR) 500 MG 24 hr tablet TAKE 2 TABLETS BY MOUTH 2 TIMES DAILY WITH A MEAL  . [DISCONTINUED] Semaglutide, 1 MG/DOSE, (OZEMPIC, 1 MG/DOSE,) 2 MG/1.5ML SOPN Inject 1 mg into the skin once a week.   No facility-administered encounter medications on file as of 07/24/2019.    EXAM:  BP 118/74   Pulse 69   Temp (!) 97.3 F (36.3 C) (Temporal)   Ht 5' 4.8" (1.646 m)   Wt 158 lb 12.8 oz (72 kg)   LMP  (LMP Unknown)   SpO2 97%   BMI 26.59 kg/m   Body mass index is 26.59 kg/m.  Physical Exam: Vital signs reviewed ZES:PQZR is a well-developed well-nourished alert cooperative   who appears stated age in no acute distress.  HEENT: normocephalic atraumatic , Eyes: PERRL EOM's full, conjunctiva clear, Nares: paten,t no deformity discharge or tenderness., Ears: no deformity EAC's clear TMs  with normal landmarks. Mouth: masked  NECK: supple without masses, thyromegaly or bruits. CHEST/PULM:  Clear to auscultation and percussion breath sounds equal no wheeze , rales or rhonchi. No chest wall deformities or tenderness. CV: PMI is nondisplaced, S1 S2 no gallops, murmurs, rubs. Peripheral pulses are full without delay.No JVD .  Breast exam deferred for gyne  ABDOMEN: Bowel sounds normal nontender  No guard or rebound, no hepato splenomegal no CVA tenderness.   Extremtities:  No clubbing cyanosis or edema, no acute joint swelling or redness no focal atrophy few  VV  NEURO:  Oriented x3, cranial nerves 3-12 appear to be intact, no obvious focal weakness,gait within normal limits no abnormal reflexes  or asymmetrical SKIN: No acute rashes normal turgor, color, no bruising or petechiae. Right hadn a "senile ecchymosis "  Small  PSYCH: Oriented, good eye contact, no obvious depression anxiety, cognition and judgment appear normal. LN: no cervical axillary inguinal adenopathy No noted deficits in memory, attention, and speech. Foot exam  sens to microfilament   Lab Results  Component Value Date   WBC 5.6 07/10/2019   HGB 14.8 07/10/2019   HCT 42.8 07/10/2019   PLT 190.0 07/10/2019   GLUCOSE 171 (H) 07/10/2019   CHOL 130 07/10/2019   TRIG 78.0 07/10/2019   HDL 44.80 07/10/2019   LDLCALC 69 07/10/2019   ALT 25 07/10/2019   AST 17 07/10/2019   NA 136 07/10/2019   K 4.3 07/10/2019   CL 98 07/10/2019   CREATININE 0.66 07/10/2019   BUN 10 07/10/2019   CO2 29 07/10/2019   TSH 4.82 (H) 07/10/2019   INR 0.9 01/13/2018   HGBA1C 7.6 (H) 07/10/2019   MICROALBUR 1.5 07/10/2019    ASSESSMENT AND PLAN:  Discussed the following assessment and plan:  Visit for preventive health examination  Medication management - Plan: TSH, T4, free, Thyroid peroxidase antibody  Hyperlipidemia, unspecified hyperlipidemia type  Diabetes mellitus without complication (HCC)  Elevated TSH - had [place] in 2017 borderline thyroid plan close fu with aby reassess  if dec for rx and follow  - Plan: TSH, T4, free, Thyroid peroxidase antibody  Need for pneumococcal vaccination - Plan: Pneumococcal polysaccharide vaccine 23-valent greater than or equal to 2yo subcutaneous/IM  Nocturia more than twice per night  Dry mouth Update pneumovax  23  Plan thyroid    1-2 months      ?  Reason for sbe pyophylactic   Will check into this  Usually done by dentist and or cardiology team.  Patient Care Team: Ansley Stanwood, Standley Brooking, MD as PCP - General (Internal Medicine) Martinique, Amy, MD as Consulting Physician (Dermatology) Dian Queen, MD as Attending Physician (Obstetrics and Gynecology) Latanya Maudlin, MD as  Consulting Physician (Orthopedic Surgery) Lelon Perla, MD as Consulting Physician (Cardiology)  Patient Instructions  I have not filled the clindamycin in recent past but will do this today  But   Will get Dr.  Stanford Breed to opine on continued need  at this time May be able to stop safely.   Will consider exploring   Dry mouth sleep issue that may be causeing nocturia and interrupted sleep and then  sleeping ness fatigue Do not think the thryoid levels are currently clinically significant so far but needs  Follow up to make sure not  Becoming more abnormal .    Health Maintenance, Female Adopting a healthy lifestyle and getting preventive care are important in promoting health and wellness. Ask your health care provider about:  The  right schedule for you to have regular tests and exams.  Things you can do on your own to prevent diseases and keep yourself healthy. What should I know about diet, weight, and exercise? Eat a healthy diet   Eat a diet that includes plenty of vegetables, fruits, low-fat dairy products, and lean protein.  Do not eat a lot of foods that are high in solid fats, added sugars, or sodium. Maintain a healthy weight Body mass index (BMI) is used to identify weight problems. It estimates body fat based on height and weight. Your health care provider can help determine your BMI and help you achieve or maintain a healthy weight. Get regular exercise Get regular exercise. This is one of the most important things you can do for your health. Most adults should:  Exercise for at least 150 minutes each week. The exercise should increase your heart rate and make you sweat (moderate-intensity exercise).  Do strengthening exercises at least twice a week. This is in addition to the moderate-intensity exercise.  Spend less time sitting. Even light physical activity can be beneficial. Watch cholesterol and blood lipids Have your blood tested for lipids and cholesterol at  68 years of age, then have this test every 5 years. Have your cholesterol levels checked more often if:  Your lipid or cholesterol levels are high.  You are older than 68 years of age.  You are at high risk for heart disease. What should I know about cancer screening? Depending on your health history and family history, you may need to have cancer screening at various ages. This may include screening for:  Breast cancer.  Cervical cancer.  Colorectal cancer.  Skin cancer.  Lung cancer. What should I know about heart disease, diabetes, and high blood pressure? Blood pressure and heart disease  High blood pressure causes heart disease and increases the risk of stroke. This is more likely to develop in people who have high blood pressure readings, are of African descent, or are overweight.  Have your blood pressure checked: ? Every 3-5 years if you are 49-65 years of age. ? Every year if you are 51 years old or older. Diabetes Have regular diabetes screenings. This checks your fasting blood sugar level. Have the screening done:  Once every three years after age 51 if you are at a normal weight and have a low risk for diabetes.  More often and at a younger age if you are overweight or have a high risk for diabetes. What should I know about preventing infection? Hepatitis B If you have a higher risk for hepatitis B, you should be screened for this virus. Talk with your health care provider to find out if you are at risk for hepatitis B infection. Hepatitis C Testing is recommended for:  Everyone born from [birth year] through 1964.  Anyone with known risk factors for hepatitis C. Sexually transmitted infections (STIs)  Get screened for STIs, including gonorrhea and chlamydia, if: ? You are sexually active and are younger than 68 years of age. ? You are older than 68 years of age and your health care provider tells you that you are at risk for this type of infection. ? Your sexual  activity has changed since you were last screened, and you are at increased risk for chlamydia or gonorrhea. Ask your health care provider if you are at risk.  Ask your health care provider about whether you are at high risk for HIV. Your health care provider may recommend  a prescription medicine to help prevent HIV infection. If you choose to take medicine to prevent HIV, you should first get tested for HIV. You should then be tested every 3 months for as long as you are taking the medicine. Pregnancy  If you are about to stop having your period (premenopausal) and you may become pregnant, seek counseling before you get pregnant.  Take 400 to 800 micrograms (mcg) of folic acid every day if you become pregnant.  Ask for birth control (contraception) if you want to prevent pregnancy. Osteoporosis and menopause Osteoporosis is a disease in which the bones lose minerals and strength with aging. This can result in bone fractures. If you are 37 years old or older, or if you are at risk for osteoporosis and fractures, ask your health care provider if you should:  Be screened for bone loss.  Take a calcium or vitamin D supplement to lower your risk of fractures.  Be given hormone replacement therapy (HRT) to treat symptoms of menopause. Follow these instructions at home: Lifestyle  Do not use any products that contain nicotine or tobacco, such as cigarettes, e-cigarettes, and chewing tobacco. If you need help quitting, ask your health care provider.  Do not use street drugs.  Do not share needles.  Ask your health care provider for help if you need support or information about quitting drugs. Alcohol use  Do not drink alcohol if: ? Your health care provider tells you not to drink. ? You are pregnant, may be pregnant, or are planning to become pregnant.  If you drink alcohol: ? Limit how much you use to 0-1 drink a day. ? Limit intake if you are breastfeeding.  Be aware of how much  alcohol is in your drink. In the [place] one drink equals one 12 oz bottle of beer (355 mL), one 5 oz glass of wine (148 mL), or one 1 oz glass of hard liquor (44 mL). General instructions  Schedule regular health, dental, and eye exams.  Stay current with your vaccines.  Tell your health care provider if: ? You often feel depressed. ? You have ever been abused or do not feel safe at home. Summary  Adopting a healthy lifestyle and getting preventive care are important in promoting health and wellness.  Follow your health care provider's instructions about healthy diet, exercising, and getting tested or screened for diseases.  Follow your health care provider's instructions on monitoring your cholesterol and blood pressure. This information is not intended to replace advice given to you by your health care provider. Make sure you discuss any questions you have with your health care provider. Document Revised: 01/15/2018 Document Reviewed: 01/15/2018 Elsevier Patient Education  2020 [place] Ellese Julius M.D.

## 2019-07-24 ENCOUNTER — Encounter: Payer: Self-pay | Admitting: Internal Medicine

## 2019-07-24 ENCOUNTER — Ambulatory Visit (INDEPENDENT_AMBULATORY_CARE_PROVIDER_SITE_OTHER): Payer: PPO | Admitting: Internal Medicine

## 2019-07-24 VITALS — BP 118/74 | HR 69 | Temp 97.3°F | Ht 64.8 in | Wt 158.8 lb

## 2019-07-24 DIAGNOSIS — R682 Dry mouth, unspecified: Secondary | ICD-10-CM | POA: Diagnosis not present

## 2019-07-24 DIAGNOSIS — Z0000 Encounter for general adult medical examination without abnormal findings: Secondary | ICD-10-CM

## 2019-07-24 DIAGNOSIS — E119 Type 2 diabetes mellitus without complications: Secondary | ICD-10-CM

## 2019-07-24 DIAGNOSIS — E785 Hyperlipidemia, unspecified: Secondary | ICD-10-CM

## 2019-07-24 DIAGNOSIS — Z79899 Other long term (current) drug therapy: Secondary | ICD-10-CM | POA: Diagnosis not present

## 2019-07-24 DIAGNOSIS — Z23 Encounter for immunization: Secondary | ICD-10-CM

## 2019-07-24 DIAGNOSIS — R7989 Other specified abnormal findings of blood chemistry: Secondary | ICD-10-CM | POA: Diagnosis not present

## 2019-07-24 DIAGNOSIS — R351 Nocturia: Secondary | ICD-10-CM | POA: Diagnosis not present

## 2019-07-24 MED ORDER — METFORMIN HCL ER 500 MG PO TB24: ORAL_TABLET | ORAL | 3 refills | Status: DC

## 2019-07-24 MED ORDER — OZEMPIC (1 MG/DOSE) 2 MG/1.5ML ~~LOC~~ SOPN: 1.0000 mg | PEN_INJECTOR | SUBCUTANEOUS | 5 refills | Status: DC

## 2019-07-24 MED ORDER — CANAGLIFLOZIN 300 MG PO TABS: ORAL_TABLET | ORAL | 3 refills | Status: DC

## 2019-07-24 MED ORDER — CLINDAMYCIN HCL 300 MG PO CAPS: ORAL_CAPSULE | ORAL | 0 refills | Status: DC

## 2019-07-24 NOTE — Patient Instructions (Addendum)
I have not filled the clindamycin in recent past but will do this today  But   Will get Dr.  Stanford Breed to opine on continued need  at this time May be able to stop safely.   Will consider exploring   Dry mouth sleep issue that may be causeing nocturia and interrupted sleep and then  sleeping ness fatigue Do not think the thryoid levels are currently clinically significant so far but needs  Follow up to make sure not  Becoming more abnormal .    Health Maintenance, Female Adopting a healthy lifestyle and getting preventive care are important in promoting health and wellness. Ask your health care provider about:  The right schedule for you to have regular tests and exams.  Things you can do on your own to prevent diseases and keep yourself healthy. What should I know about diet, weight, and exercise? Eat a healthy diet   Eat a diet that includes plenty of vegetables, fruits, low-fat dairy products, and lean protein.  Do not eat a lot of foods that are high in solid fats, added sugars, or sodium. Maintain a healthy weight Body mass index (BMI) is used to identify weight problems. It estimates body fat based on height and weight. Your health care provider can help determine your BMI and help you achieve or maintain a healthy weight. Get regular exercise Get regular exercise. This is one of the most important things you can do for your health. Most adults should:  Exercise for at least 150 minutes each week. The exercise should increase your heart rate and make you sweat (moderate-intensity exercise).  Do strengthening exercises at least twice a week. This is in addition to the moderate-intensity exercise.  Spend less time sitting. Even light physical activity can be beneficial. Watch cholesterol and blood lipids Have your blood tested for lipids and cholesterol at 68 years of age, then have this test every 5 years. Have your cholesterol levels checked more often if:  Your lipid or  cholesterol levels are high.  You are older than 68 years of age.  You are at high risk for heart disease. What should I know about cancer screening? Depending on your health history and family history, you may need to have cancer screening at various ages. This may include screening for:  Breast cancer.  Cervical cancer.  Colorectal cancer.  Skin cancer.  Lung cancer. What should I know about heart disease, diabetes, and high blood pressure? Blood pressure and heart disease  High blood pressure causes heart disease and increases the risk of stroke. This is more likely to develop in people who have high blood pressure readings, are of African descent, or are overweight.  Have your blood pressure checked: ? Every 3-5 years if you are 2-42 years of age. ? Every year if you are 63 years old or older. Diabetes Have regular diabetes screenings. This checks your fasting blood sugar level. Have the screening done:  Once every three years after age 45 if you are at a normal weight and have a low risk for diabetes.  More often and at a younger age if you are overweight or have a high risk for diabetes. What should I know about preventing infection? Hepatitis B If you have a higher risk for hepatitis B, you should be screened for this virus. Talk with your health care provider to find out if you are at risk for hepatitis B infection. Hepatitis C Testing is recommended for:  Everyone born from  1944 through 64.  Anyone with known risk factors for hepatitis C. Sexually transmitted infections (STIs)  Get screened for STIs, including gonorrhea and chlamydia, if: ? You are sexually active and are younger than 68 years of age. ? You are older than 68 years of age and your health care provider tells you that you are at risk for this type of infection. ? Your sexual activity has changed since you were last screened, and you are at increased risk for chlamydia or gonorrhea. Ask your  health care provider if you are at risk.  Ask your health care provider about whether you are at high risk for HIV. Your health care provider may recommend a prescription medicine to help prevent HIV infection. If you choose to take medicine to prevent HIV, you should first get tested for HIV. You should then be tested every 3 months for as long as you are taking the medicine. Pregnancy  If you are about to stop having your period (premenopausal) and you may become pregnant, seek counseling before you get pregnant.  Take 400 to 800 micrograms (mcg) of folic acid every day if you become pregnant.  Ask for birth control (contraception) if you want to prevent pregnancy. Osteoporosis and menopause Osteoporosis is a disease in which the bones lose minerals and strength with aging. This can result in bone fractures. If you are 7 years old or older, or if you are at risk for osteoporosis and fractures, ask your health care provider if you should:  Be screened for bone loss.  Take a calcium or vitamin D supplement to lower your risk of fractures.  Be given hormone replacement therapy (HRT) to treat symptoms of menopause. Follow these instructions at home: Lifestyle  Do not use any products that contain nicotine or tobacco, such as cigarettes, e-cigarettes, and chewing tobacco. If you need help quitting, ask your health care provider.  Do not use street drugs.  Do not share needles.  Ask your health care provider for help if you need support or information about quitting drugs. Alcohol use  Do not drink alcohol if: ? Your health care provider tells you not to drink. ? You are pregnant, may be pregnant, or are planning to become pregnant.  If you drink alcohol: ? Limit how much you use to 0-1 drink a day. ? Limit intake if you are breastfeeding.  Be aware of how much alcohol is in your drink. In the [place] one drink equals one 12 oz bottle of beer (355 mL), one 5 oz glass of wine (148  mL), or one 1 oz glass of hard liquor (44 mL). General instructions  Schedule regular health, dental, and eye exams.  Stay current with your vaccines.  Tell your health care provider if: ? You often feel depressed. ? You have ever been abused or do not feel safe at home. Summary  Adopting a healthy lifestyle and getting preventive care are important in promoting health and wellness.  Follow your health care provider's instructions about healthy diet, exercising, and getting tested or screened for diseases.  Follow your health care provider's instructions on monitoring your cholesterol and blood pressure. This information is not intended to replace advice given to you by your health care provider. Make sure you discuss any questions you have with your health care provider. Document Revised: 01/15/2018 Document Reviewed: 01/15/2018 Elsevier Patient Education  2020 [employer].

## 2019-08-19 ENCOUNTER — Other Ambulatory Visit: Payer: Self-pay | Admitting: Internal Medicine

## 2019-08-24 NOTE — Addendum Note (Signed)
Addended by: Marrion Coy on: 08/24/2019 04:52 PM   Modules accepted: Orders

## 2019-08-31 DIAGNOSIS — M9903 Segmental and somatic dysfunction of lumbar region: Secondary | ICD-10-CM | POA: Diagnosis not present

## 2019-08-31 DIAGNOSIS — M5134 Other intervertebral disc degeneration, thoracic region: Secondary | ICD-10-CM | POA: Diagnosis not present

## 2019-08-31 DIAGNOSIS — M9902 Segmental and somatic dysfunction of thoracic region: Secondary | ICD-10-CM | POA: Diagnosis not present

## 2019-09-14 ENCOUNTER — Other Ambulatory Visit: Payer: Self-pay | Admitting: Internal Medicine

## 2019-09-14 ENCOUNTER — Other Ambulatory Visit: Payer: Self-pay | Admitting: Cardiology

## 2019-09-25 ENCOUNTER — Other Ambulatory Visit (INDEPENDENT_AMBULATORY_CARE_PROVIDER_SITE_OTHER): Payer: PPO

## 2019-09-25 ENCOUNTER — Other Ambulatory Visit: Payer: Self-pay

## 2019-09-25 DIAGNOSIS — Z79899 Other long term (current) drug therapy: Secondary | ICD-10-CM | POA: Diagnosis not present

## 2019-09-25 DIAGNOSIS — R7989 Other specified abnormal findings of blood chemistry: Secondary | ICD-10-CM | POA: Diagnosis not present

## 2019-09-28 DIAGNOSIS — M9903 Segmental and somatic dysfunction of lumbar region: Secondary | ICD-10-CM | POA: Diagnosis not present

## 2019-09-28 DIAGNOSIS — M5134 Other intervertebral disc degeneration, thoracic region: Secondary | ICD-10-CM | POA: Diagnosis not present

## 2019-09-28 DIAGNOSIS — M9902 Segmental and somatic dysfunction of thoracic region: Secondary | ICD-10-CM | POA: Diagnosis not present

## 2019-09-28 LAB — T4, FREE: Free T4: 1.4 ng/dL (ref 0.8–1.8)

## 2019-09-28 LAB — THYROID PEROXIDASE ANTIBODY: Thyroperoxidase Ab SerPl-aCnc: 401 IU/mL — ABNORMAL HIGH (ref <9)

## 2019-09-28 LAB — TSH: TSH: 5.83 mIU/L — ABNORMAL HIGH (ref 0.40–4.50)

## 2019-09-28 NOTE — Progress Notes (Signed)
Thyroid  still mildly lunderactive  function  and positive aby consistent with autoimmune thyroid condition. You may have no sx with this  but  can add to fatigue . I advise we try adding low dose  thyroid med if you are ok with this.  Please send in levothyroxine or synthroid  50 mcg  1 po qd disp 90    Check tsh  and free t4 in  2 months and then plan rov or VV  to go over results since we are starting a new medication.

## 2019-09-29 ENCOUNTER — Other Ambulatory Visit: Payer: Self-pay

## 2019-09-29 DIAGNOSIS — R7989 Other specified abnormal findings of blood chemistry: Secondary | ICD-10-CM

## 2019-09-29 MED ORDER — LEVOTHYROXINE SODIUM 50 MCG PO TABS
50.0000 ug | ORAL_TABLET | Freq: Every day | ORAL | 0 refills | Status: DC
Start: 2019-09-29 — End: 2019-12-22

## 2019-09-29 NOTE — Telephone Encounter (Signed)
Yes please   Complete and send in med and arrange fu labs as in results note

## 2019-11-09 ENCOUNTER — Telehealth: Payer: Self-pay

## 2019-11-09 DIAGNOSIS — M9903 Segmental and somatic dysfunction of lumbar region: Secondary | ICD-10-CM | POA: Diagnosis not present

## 2019-11-09 DIAGNOSIS — M5134 Other intervertebral disc degeneration, thoracic region: Secondary | ICD-10-CM | POA: Diagnosis not present

## 2019-11-09 DIAGNOSIS — M9902 Segmental and somatic dysfunction of thoracic region: Secondary | ICD-10-CM | POA: Diagnosis not present

## 2019-11-09 NOTE — Telephone Encounter (Signed)
Per recall pt needs to have EGD at the hospital for gastric polyps and intestinal metaplasia.

## 2019-11-10 ENCOUNTER — Other Ambulatory Visit: Payer: Self-pay

## 2019-11-10 DIAGNOSIS — M9903 Segmental and somatic dysfunction of lumbar region: Secondary | ICD-10-CM | POA: Diagnosis not present

## 2019-11-10 DIAGNOSIS — K3189 Other diseases of stomach and duodenum: Secondary | ICD-10-CM

## 2019-11-10 DIAGNOSIS — M5134 Other intervertebral disc degeneration, thoracic region: Secondary | ICD-10-CM | POA: Diagnosis not present

## 2019-11-10 DIAGNOSIS — M9902 Segmental and somatic dysfunction of thoracic region: Secondary | ICD-10-CM | POA: Diagnosis not present

## 2019-11-10 NOTE — Telephone Encounter (Signed)
The pt has been scheduled for EGD on 01/14/20 at [facility] 730 am and COVID test on 12/6.  The pt was instructed and asked for information to be sent to My Chart.

## 2019-11-10 NOTE — Telephone Encounter (Signed)
Placed call to the pt left message on voice mail to offer 12/23/19 at [facility] 1115 am with Dr Rush Landmark

## 2019-11-11 ENCOUNTER — Ambulatory Visit (INDEPENDENT_AMBULATORY_CARE_PROVIDER_SITE_OTHER): Payer: PPO

## 2019-11-11 ENCOUNTER — Other Ambulatory Visit: Payer: Self-pay

## 2019-11-11 VITALS — Wt 150.0 lb

## 2019-11-11 DIAGNOSIS — Z0000 Encounter for general adult medical examination without abnormal findings: Secondary | ICD-10-CM | POA: Diagnosis not present

## 2019-11-11 NOTE — Progress Notes (Signed)
Virtual Visit via Telephone Note  I connected with  Morgan [name] on 11/11/19 at  8:00 AM EDT by telephone and verified that I am speaking with the correct person using two identifiers.  Medicare Annual Wellness visit completed telephonically due to Covid-19 pandemic.   Persons participating in this call: This Health Coach and this patient.   Location: Patient: Home Provider: Office   I discussed the limitations, risks, security and privacy concerns of performing an evaluation and management service by telephone and the availability of in person appointments. The patient expressed understanding and agreed to proceed.  Unable to perform video visit due to video visit attempted and failed and/or patient does not have video capability.   Some vital signs may be absent or patient reported.   Willette Brace, LPN    Subjective:   Morgan [name] is a 69 y.o. female who presents for an Initial Medicare Annual Wellness Visit.  Review of Systems     Cardiac Risk Factors include: advanced age (>55men, >19 women);diabetes mellitus;dyslipidemia     Objective:    Today's Vitals   11/11/19 0808  Weight: 150 lb (68 kg)   Body mass index is 25.12 kg/m.  Advanced Directives 11/11/2019 09/29/2018 02/12/2018  Does Patient Have a Medical Advance Directive? Yes Yes Yes  Type of Paramedic of [place];Living will [place];Living will [place];Living will  Copy of [place] in Chart? No - copy requested No - copy requested No - copy requested    Current Medications (verified) Outpatient Encounter Medications as of 11/11/2019  Medication Sig  . acetaminophen (TYLENOL) 500 MG tablet Take 1,000 mg by mouth every 6 (six) hours as needed for moderate [name] or headache.  Marland Kitchen aspirin 81 MG tablet Take 81 mg by mouth daily.    Marland Kitchen atorvastatin (LIPITOR) 80 MG tablet TAKE 1 TABLET BY MOUTH EVERY DAY AT 6 pm  .  canagliflozin (INVOKANA) 300 MG TABS tablet TAKE 1 TABLET BY MOUTH EVERY DAY BEFORE BREAKFAST  . Cholecalciferol (VITAMIN D3) 125 MCG (5000 UT) CAPS Take 5,000 Units by mouth daily.  . clindamycin (CLEOCIN T) 1 % lotion Apply 1 application topically daily.  . Cranberry-Vitamin C-Vitamin E (CRANBERRY PLUS VITAMIN C) 4200-20-3 MG-MG-UNIT CAPS Take 2 capsules by mouth daily.  . Cyanocobalamin (B-12 PO) Take 2 tablets by mouth daily. 1000 mcg per  . ezetimibe (ZETIA) 10 MG tablet TAKE 1 TABLET BY MOUTH EVERY DAY  . Ferrous Sulfate (IRON) 325 (65 Fe) MG TABS Take 650 mg by mouth daily.   Elmore Guise Devices (ONE TOUCH DELICA LANCING DEV) MISC Use as directed to check blood sugars twice to four time per day. Dx E11.9  . Lancets (ONETOUCH DELICA PLUS [id number]) MISC Check blood sugar 2 to 3 times daily.  Marland Kitchen levothyroxine (SYNTHROID) 50 MCG tablet Take 1 tablet (50 mcg total) by mouth daily.  . metFORMIN (GLUCOPHAGE-XR) 500 MG 24 hr tablet TAKE 2 TABLETS BY MOUTH 2 TIMES DAILY WITH A MEAL  . Multiple Vitamins-Minerals (MULTIVITAMIN PO) Take 1 tablet by mouth daily.   . Omega-3 Fatty Acids (FISH OIL) 1000 MG CAPS Take 2,000 mg by mouth daily.  Glory Rosebush ULTRA test strip USE TO CHECK BLOOD SUGAR 2 TO 3 TIMES DAILY  . Semaglutide, 1 MG/DOSE, (OZEMPIC, 1 MG/DOSE,) 2 MG/1.5ML SOPN Inject 0.75 mLs (1 mg total) into the skin once a week.  . clindamycin (CLEOCIN) 300 MG capsule TAKE 2 CAPSULES BY  MOUTH 1 HOUR PRIOR TO DENTAL APPOINTMENT & 2 CAPSULES IMMEDIATELY FOLLOWING APPOINTMENT (Patient not taking: Reported on 11/11/2019)  . omeprazole (PRILOSEC) 40 MG capsule Take 1 capsule (40 mg total) by mouth 2 (two) times daily.   No facility-administered encounter medications on file as of 11/11/2019.    Allergies (verified) Penicillins   History: Past Medical History:  Diagnosis Date  . Anemia    as a child   . CAD, NATIVE VESSEL 05/13/2009  . Cataracts, bilateral    MD just watching  . Complication of  anesthesia   . DIABETES MELLITUS, TYPE II 12/09/2009  . Gastric ulcer   . Gastritis   . GERD (gastroesophageal reflux disease)   . Hx of abnormal cervical Pap smear    cryo  but normal since then   . HYPERLIPIDEMIA-MIXED 04/14/2008  . Intestinal metaplasia of gastric mucosa 2012  . Myocardial infarction ([place])    2003  . Obesity   . Occult blood in stools   . Osteopenia   . OVERWEIGHT/OBESITY 05/13/2009  . Ulcer    Duodenal ulcer by x-ray 40 years ago  . Varicose veins    under rx [place] 2016   Past Surgical History:  Procedure Laterality Date  . BIOPSY  09/29/2018   Procedure: BIOPSY;  Surgeon: Rush Landmark Telford Nab., MD;  Location: [place];  Service: Gastroenterology;;  . CESAREAN SECTION     x2  . CLOSED REDUCTION PROXIMAL TIBIOFIBULAR JOINT [place]  2004   left  . COLONOSCOPY    . COLONOSCOPY WITH PROPOFOL N/A 09/29/2018   Procedure: COLONOSCOPY WITH PROPOFOL;  Surgeon: Rush Landmark Telford Nab., MD;  Location: [facility];  Service: Gastroenterology;  Laterality: N/A;  . DILATION AND CURETTAGE OF UTERUS    . ECTOPIC PREGNANCY SURGERY    . ESOPHAGOGASTRODUODENOSCOPY N/A 02/12/2018   Procedure: ESOPHAGOGASTRODUODENOSCOPY (EGD);  Surgeon: Irving Copas., MD;  Location: [facility];  Service: Gastroenterology;  Laterality: N/A;  . ESOPHAGOGASTRODUODENOSCOPY N/A 09/29/2018   Procedure: ESOPHAGOGASTRODUODENOSCOPY (EGD);  Surgeon: Irving Copas., MD;  Location: [place];  Service: Gastroenterology;  Laterality: N/A;  . EUS N/A 02/12/2018   Procedure: UPPER ENDOSCOPIC ULTRASOUND (EUS) RADIAL;  Surgeon: Rush Landmark Telford Nab., MD;  Location: [facility];  Service: Gastroenterology;  Laterality: N/A;  . FIBULA FRACTURE SURGERY    . GANGLION CYST EXCISION    . POLYPECTOMY  02/12/2018   Procedure: POLYPECTOMY;  Surgeon: Mansouraty, Telford Nab., MD;  Location: [facility];  Service: Gastroenterology;;  . TONSILLECTOMY    . TUBAL LIGATION     One Tube  . UPPER  GASTROINTESTINAL ENDOSCOPY     08/27/17   Family History  Problem Relation Age of Onset  . Emphysema Mother        Smoker  . Heart attack Mother        due to head injury  . Alcohol abuse Mother   . Heart disease Mother   . Early death Mother   . Varicose Veins Mother   . AAA (abdominal aortic aneurysm) Mother   . Lung cancer Father   . Heart attack Father   . Hypertension Father   . Alcohol abuse Father   . Heart disease Father        before age 106  . Aneurysm Father   . Hyperlipidemia Father   . Alcohol abuse Brother   . Diabetes Brother   . Heart disease Brother   . Heart attack Brother   . Diabetes Brother   . Aneurysm Paternal Grandmother   .  Diabetes Other        grandfather  . Sudden Cardiac Death Brother   . Colon cancer Neg Hx   . Esophageal cancer Neg Hx   . Stomach cancer Neg Hx   . Inflammatory bowel disease Neg Hx   . Liver disease Neg Hx   . Pancreatic cancer Neg Hx   . Rectal cancer Neg Hx    Social History   Socioeconomic History  . Marital status: Widowed    Spouse name: Not on file  . Number of children: 2  . Years of education: phd   . Highest education level: Not on file  Occupational History  . Occupation: Mental Health Counselor    Comment: [employer]  . Occupation: Nurse, adult: [employer]  Tobacco Use  . Smoking status: Former Smoker    Types: Cigarettes    Quit date: 02/05/1994    Years since quitting: 25.7  . Smokeless tobacco: Never Used  Vaping Use  . Vaping Use: Never used  Substance and Sexual Activity  . Alcohol use: No    Alcohol/week: 0.0 standard drinks  . Drug use: No  . Sexual activity: Not on file  Other Topics Concern  . 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    No firearms      NO reg exercise    Pets- Poodles 2    Sleep 7 hours    Social Determinants of Health   Financial Resource Strain: Low Risk   . Difficulty of Paying Living Expenses: Not hard at all  Food Insecurity: No Food Insecurity  . Worried About Charity fundraiser in the Last Year: Never true  . Ran Out of Food in the Last Year: Never true  Transportation Needs: No Transportation Needs  . Lack of Transportation (Medical): No  . Lack of Transportation (Non-Medical): No  Physical Activity: Inactive  . Days of Exercise per Week: 0 days  . Minutes of Exercise per Session: 0 min  Stress: No Stress Concern Present  . Feeling of Stress : Not at all  Social Connections: Moderately Isolated  . Frequency of Communication with Friends and Family: More than three times a week  . Frequency of Social Gatherings with Friends and Family: More than three times a week  . Attends Religious Services: Never  . Active Member of Clubs or Organizations: Yes  . Attends Archivist Meetings: More than 4 times per year  . Marital Status: Widowed    Tobacco Counseling Counseling given: Not Answered   Clinical Intake:  Pre-visit preparation completed: Yes  [name] : No/denies [name]     BMI - recorded: 25.12 Nutritional Status: BMI 25 -29 Overweight Nutritional Risks: None, Unintentional weight loss (concerned about pre cancer history) Diabetes: Yes CBG done?: Yes (113) CBG resulted in Enter/ Edit results?: No Did pt. bring in CBG monitor from home?: No  How often do you need to have someone help you when you read instructions, pamphlets, or other written materials from your doctor or pharmacy?: 1 - Never  Diabetic?Yes  Interpreter Needed?: No  Information entered by :: Charlott Rakes, LPN  Activities of Daily Living In your present state of health, do you have any difficulty performing the following activities: 11/11/2019  Hearing? N  Vision? N  Difficulty concentrating or making  decisions? N  Walking or climbing stairs? N  Dressing or bathing? N  Doing errands, shopping? N  Preparing Food and eating ? N  Using the Toilet? N  In the past six months, have you accidently leaked urine? N  Do you have problems with loss of bowel control? N  Managing your Medications? N  Managing your Finances? N  Housekeeping or managing your Housekeeping? N  Some recent data might be hidden    Patient Care Team: Panosh, Standley Brooking, MD as PCP - General (Internal Medicine) Martinique, Amy, MD as Consulting Physician (Dermatology) Dian Queen, MD as Attending Physician (Obstetrics and Gynecology) Latanya Maudlin, MD as Consulting Physician (Orthopedic Surgery) Lelon Perla, MD as Consulting Physician (Cardiology)  Indicate any recent Medical Services you may have received from other than [facility] providers in the past year (date may be approximate).     Assessment:   This is a routine wellness examination for [facility].  Hearing/Vision screen  Hearing Screening   125Hz  250Hz  500Hz  1000Hz  2000Hz  3000Hz  4000Hz  6000Hz  8000Hz   Right ear:           Left ear:           Comments: Pt states no difficulty hearing at this time  Vision Screening Comments: Pt follows up with [facility] and Dr Celene Squibb for cataracts  Dietary issues and exercise activities discussed: Current Exercise Habits: The patient does not participate in regular exercise at present  Goals    . Patient Stated     Stay Healthy and start back using treadmill       Depression Screen PHQ 2/9 Scores 11/11/2019 07/24/2019 10/03/2018 09/20/2017 07/09/2016  PHQ - 2 Score 1 0 0 0 0  PHQ- 9 Score - 0 - - -    Fall Risk Fall Risk  11/11/2019 07/24/2019 12/26/2018 10/03/2018 09/20/2017  Falls in the past year? 0 0 0 0 No  Number falls in past yr: 0 - 0 - -  Injury with Fall? 0 - 0 0 -  Risk for fall due to : Impaired vision - History of fall(s) - -  Follow up Falls prevention discussed - Falls evaluation completed - -     Any stairs in or around the home? Yes  If so, are there any without handrails? No  Home free of loose throw rugs in walkways, pet beds, electrical cords, etc? Yes  Adequate lighting in your home to reduce risk of falls? Yes   ASSISTIVE DEVICES UTILIZED TO PREVENT FALLS:  Life alert? No  Use of a cane, walker or w/c? No  Grab bars in the bathroom? No  Shower chair or bench in shower? Yes  Elevated toilet seat or a handicapped toilet? Yes   TIMED UP AND GO:  Was the test performed? No .      Cognitive Function:     6CIT Screen 11/11/2019  What Year? 0 points  What month? 0 points  Count back from 20 0 points  Months in reverse 0 points  Repeat phrase 0 points    Immunizations Immunization History  Administered Date(s) Administered  . Fluad Quad(high Dose 65+) 10/03/2018  . Hep A / Hep B 04/14/2010, 05/19/2010, 10/27/2010  . Influenza Split 10/27/2010  . Influenza, High Dose Seasonal PF 12/20/2016  . Influenza,inj,Quad PF,6+ Mos 12/12/2015  .  PFIZER SARS-COV-2 Vaccination 03/14/2019, 04/06/2019, 11/01/2019  . Pneumococcal Conjugate-13 07/09/2016  . Pneumococcal Polysaccharide-23 10/27/2004, 04/14/2010, 07/24/2019  . Td 07/08/2004  . Tdap 09/24/2014    TDAP status: Up to date Flu Vaccine status: Declined, Education has been provided regarding the importance of this vaccine but patient still declined. Advised may receive this vaccine at local pharmacy or Health Dept. Aware to provide a copy of the vaccination record if obtained from local pharmacy or Health Dept. Verbalized acceptance and understanding.pt will get flu vaccine at next visit 11/27/19 Pneumococcal vaccine status: Up to date Covid-19 vaccine status: Completed vaccines  Qualifies for Shingles Vaccine? Yes   Zostavax completed No   Shingrix Completed?: No.    Education has been provided regarding the importance of this vaccine. Patient has been advised to call insurance company to determine out of  pocket expense if they have not yet received this vaccine. Advised may also receive vaccine at local pharmacy or Health Dept. Verbalized acceptance and understanding.  Screening Tests Health Maintenance  Topic Date Due  . INFLUENZA VACCINE  09/06/2019  . COLONOSCOPY  09/29/2019  . FOOT EXAM  12/26/2019  . HEMOGLOBIN A1C  01/09/2020  . MAMMOGRAM  03/12/2020  . OPHTHALMOLOGY EXAM  05/21/2020  . URINE MICROALBUMIN  07/09/2020  . TETANUS/TDAP  09/23/2024  . DEXA SCAN  Completed  . COVID-19 Vaccine  Completed  . Hepatitis C Screening  Completed  . PNA vac Low Risk Adult  Completed    Health Maintenance  Health Maintenance Due  Topic Date Due  . INFLUENZA VACCINE  09/06/2019  . COLONOSCOPY  09/29/2019    Colorectal cancer screening: Completed 09/29/18. Repeat every 1 years Mammogram status: Completed 03/12/18. Repeat every year Bone Density status: Completed 09/17/14. Results reflect: Bone density results: OSTEOPENIA. Repeat every 2 years.    Additional Screening:  Hepatitis C Screening: Completed 06/20/12  Vision Screening: Recommended annual ophthalmology exams for early detection of glaucoma and other disorders of the eye. Is the patient up to date with their annual eye exam?  Yes  Who is the provider or what is the name of the office in which the patient attends annual eye exams? [facility] and Dr Celene Squibb   Dental Screening: Recommended annual dental exams for proper oral hygiene  Community Resource Referral / Chronic Care Management: CRR required this visit?  No   CCM required this visit?  No      Plan:     I have personally reviewed and noted the following in the patient's chart:   . Medical and social history . Use of alcohol, tobacco or illicit drugs  . Current medications and supplements . Functional ability and status . Nutritional status . Physical activity . Advanced directives . List of other physicians . Hospitalizations, surgeries, and ER visits  in previous 12 months . Vitals . Screenings to include cognitive, depression, and falls . Referrals and appointments  In addition, I have reviewed and discussed with patient certain preventive protocols, quality metrics, and best practice recommendations. A written personalized care plan for preventive services as well as general preventive health recommendations were provided to patient.     Willette Brace, LPN   [id number]   Nurse Notes: Pt is due for colonoscopy, upon placing order it stated it had already been ordered by a MD on 11/10/19.

## 2019-11-11 NOTE — Patient Instructions (Addendum)
Morgan [name] , Thank you for taking time to come for your Medicare Wellness Visit. I appreciate your ongoing commitment to your health goals. Please review the following plan we discussed and let me know if I can assist you in the future.   Screening recommendations/referrals: Colonoscopy: Done 09/29/18 Mammogram: Done 03/12/18 Bone Density: Done 09/17/14 Recommended yearly ophthalmology/optometry visit for glaucoma screening and checkup Recommended yearly dental visit for hygiene and checkup  Vaccinations: Influenza vaccine: Due, Pt wants administration upon office visit 11/27/19 Pneumococcal vaccine: Up to date Tdap vaccine: Up to date Shingles vaccine: Shingrix discussed. Please contact your pharmacy for coverage information.    Covid-19:Completed 2/6, 3/1, & 11/01/19  Advanced directives: Please bring a copy of your health care power of attorney and living will to the office at your convenience.  Conditions/risks identified: Stay healthy and start using Treadmill again  Next appointment: Follow up in one year for your annual wellness visit    Preventive Care 65 Years and Older, Female Preventive care refers to lifestyle choices and visits with your health care provider that can promote health and wellness. What does preventive care include?  A yearly physical exam. This is also called an annual well check.  Dental exams once or twice a year.  Routine eye exams. Ask your health care provider how often you should have your eyes checked.  Personal lifestyle choices, including:  Daily care of your teeth and gums.  Regular physical activity.  Eating a healthy diet.  Avoiding tobacco and drug use.  Limiting alcohol use.  Practicing safe sex.  Taking low-dose aspirin every day.  Taking vitamin and mineral supplements as recommended by your health care provider. What happens during an annual well check? The services and screenings done by your health care provider during your  annual well check will depend on your age, overall health, lifestyle risk factors, and family history of disease. Counseling  Your health care provider may ask you questions about your:  Alcohol use.  Tobacco use.  Drug use.  Emotional well-being.  Home and relationship well-being.  Sexual activity.  Eating habits.  History of falls.  Memory and ability to understand (cognition).  Work and work Statistician.  Reproductive health. Screening  You may have the following tests or measurements:  Height, weight, and BMI.  Blood pressure.  Lipid and cholesterol levels. These may be checked every 5 years, or more frequently if you are over 66 years old.  Skin check.  Lung cancer screening. You may have this screening every year starting at age 26 if you have a 30-pack-year history of smoking and currently smoke or have quit within the past 15 years.  Fecal occult blood test (FOBT) of the stool. You may have this test every year starting at age 44.  Flexible sigmoidoscopy or colonoscopy. You may have a sigmoidoscopy every 5 years or a colonoscopy every 10 years starting at age 107.  Hepatitis C blood test.  Hepatitis B blood test.  Sexually transmitted disease (STD) testing.  Diabetes screening. This is done by checking your blood sugar (glucose) after you have not eaten for a while (fasting). You may have this done every 1-3 years.  Bone density scan. This is done to screen for osteoporosis. You may have this done starting at age 24.  Mammogram. This may be done every 1-2 years. Talk to your health care provider about how often you should have regular mammograms. Talk with your health care provider about your test results, treatment options,  and if necessary, the need for more tests. Vaccines  Your health care provider may recommend certain vaccines, such as:  Influenza vaccine. This is recommended every year.  Tetanus, diphtheria, and acellular pertussis (Tdap, Td)  vaccine. You may need a Td booster every 10 years.  Zoster vaccine. You may need this after age 55.  Pneumococcal 13-valent conjugate (PCV13) vaccine. One dose is recommended after age 36.  Pneumococcal polysaccharide (PPSV23) vaccine. One dose is recommended after age 8. Talk to your health care provider about which screenings and vaccines you need and how often you need them. This information is not intended to replace advice given to you by your health care provider. Make sure you discuss any questions you have with your health care provider. Document Released: 02/18/2015 Document Revised: 10/12/2015 Document Reviewed: 11/23/2014 Elsevier Interactive Patient Education  2017 [employer] Prevention in the Home Falls can cause injuries. They can happen to people of all ages. There are many things you can do to make your home safe and to help prevent falls. What can I do on the outside of my home?  Regularly fix the edges of walkways and driveways and fix any cracks.  Remove anything that might make you trip as you walk through a door, such as a raised step or threshold.  Trim any bushes or trees on the path to your home.  Use bright outdoor lighting.  Clear any walking paths of anything that might make someone trip, such as rocks or tools.  Regularly check to see if handrails are loose or broken. Make sure that both sides of any steps have handrails.  Any raised decks and porches should have guardrails on the edges.  Have any leaves, snow, or ice cleared regularly.  Use sand or salt on walking paths during winter.  Clean up any spills in your garage right away. This includes oil or grease spills. What can I do in the bathroom?  Use night lights.  Install grab bars by the toilet and in the tub and shower. Do not use towel bars as grab bars.  Use non-skid mats or decals in the tub or shower.  If you need to sit down in the shower, use a plastic, non-slip  stool.  Keep the floor dry. Clean up any water that spills on the floor as soon as it happens.  Remove soap buildup in the tub or shower regularly.  Attach bath mats securely with double-sided non-slip rug tape.  Do not have throw rugs and other things on the floor that can make you trip. What can I do in the bedroom?  Use night lights.  Make sure that you have a light by your bed that is easy to reach.  Do not use any sheets or blankets that are too big for your bed. They should not hang down onto the floor.  Have a firm chair that has side arms. You can use this for support while you get dressed.  Do not have throw rugs and other things on the floor that can make you trip. What can I do in the kitchen?  Clean up any spills right away.  Avoid walking on wet floors.  Keep items that you use a lot in easy-to-reach places.  If you need to reach something above you, use a strong step stool that has a grab bar.  Keep electrical cords out of the way.  Do not use floor polish or wax that makes floors slippery. If  you must use wax, use non-skid floor wax.  Do not have throw rugs and other things on the floor that can make you trip. What can I do with my stairs?  Do not leave any items on the stairs.  Make sure that there are handrails on both sides of the stairs and use them. Fix handrails that are broken or loose. Make sure that handrails are as long as the stairways.  Check any carpeting to make sure that it is firmly attached to the stairs. Fix any carpet that is loose or worn.  Avoid having throw rugs at the top or bottom of the stairs. If you do have throw rugs, attach them to the floor with carpet tape.  Make sure that you have a light switch at the top of the stairs and the bottom of the stairs. If you do not have them, ask someone to add them for you. What else can I do to help prevent falls?  Wear shoes that:  Do not have high heels.  Have rubber bottoms.  Are  comfortable and fit you well.  Are closed at the toe. Do not wear sandals.  If you use a stepladder:  Make sure that it is fully opened. Do not climb a closed stepladder.  Make sure that both sides of the stepladder are locked into place.  Ask someone to hold it for you, if possible.  Clearly mark and make sure that you can see:  Any grab bars or handrails.  First and last steps.  Where the edge of each step is.  Use tools that help you move around (mobility aids) if they are needed. These include:  Canes.  Walkers.  Scooters.  Crutches.  Turn on the lights when you go into a dark area. Replace any light bulbs as soon as they burn out.  Set up your furniture so you have a clear path. Avoid moving your furniture around.  If any of your floors are uneven, fix them.  If there are any pets around you, be aware of where they are.  Review your medicines with your doctor. Some medicines can make you feel dizzy. This can increase your chance of falling. Ask your doctor what other things that you can do to help prevent falls. This information is not intended to replace advice given to you by your health care provider. Make sure you discuss any questions you have with your health care provider. Document Released: 11/18/2008 Document Revised: 06/30/2015 Document Reviewed: 02/26/2014 Elsevier Interactive Patient Education  2017 [employer].

## 2019-11-12 DIAGNOSIS — M9902 Segmental and somatic dysfunction of thoracic region: Secondary | ICD-10-CM | POA: Diagnosis not present

## 2019-11-12 DIAGNOSIS — M9903 Segmental and somatic dysfunction of lumbar region: Secondary | ICD-10-CM | POA: Diagnosis not present

## 2019-11-12 DIAGNOSIS — M5134 Other intervertebral disc degeneration, thoracic region: Secondary | ICD-10-CM | POA: Diagnosis not present

## 2019-11-17 DIAGNOSIS — M9902 Segmental and somatic dysfunction of thoracic region: Secondary | ICD-10-CM | POA: Diagnosis not present

## 2019-11-17 DIAGNOSIS — M5134 Other intervertebral disc degeneration, thoracic region: Secondary | ICD-10-CM | POA: Diagnosis not present

## 2019-11-17 DIAGNOSIS — M9903 Segmental and somatic dysfunction of lumbar region: Secondary | ICD-10-CM | POA: Diagnosis not present

## 2019-11-19 DIAGNOSIS — M9903 Segmental and somatic dysfunction of lumbar region: Secondary | ICD-10-CM | POA: Diagnosis not present

## 2019-11-19 DIAGNOSIS — M5134 Other intervertebral disc degeneration, thoracic region: Secondary | ICD-10-CM | POA: Diagnosis not present

## 2019-11-19 DIAGNOSIS — M9902 Segmental and somatic dysfunction of thoracic region: Secondary | ICD-10-CM | POA: Diagnosis not present

## 2019-11-20 ENCOUNTER — Other Ambulatory Visit (INDEPENDENT_AMBULATORY_CARE_PROVIDER_SITE_OTHER): Payer: PPO

## 2019-11-20 ENCOUNTER — Other Ambulatory Visit: Payer: Self-pay

## 2019-11-20 DIAGNOSIS — E119 Type 2 diabetes mellitus without complications: Secondary | ICD-10-CM

## 2019-11-20 DIAGNOSIS — R7989 Other specified abnormal findings of blood chemistry: Secondary | ICD-10-CM

## 2019-11-20 LAB — POCT GLYCOSYLATED HEMOGLOBIN (HGB A1C): Hemoglobin A1C: 7.2 % — AB (ref 4.0–5.6)

## 2019-11-21 LAB — T4, FREE: Free T4: 1.6 ng/dL (ref 0.8–1.8)

## 2019-11-21 LAB — EXTRA LAV TOP TUBE

## 2019-11-21 LAB — TSH: TSH: 2.49 mIU/L (ref 0.40–4.50)

## 2019-11-23 DIAGNOSIS — M9902 Segmental and somatic dysfunction of thoracic region: Secondary | ICD-10-CM | POA: Diagnosis not present

## 2019-11-23 DIAGNOSIS — M9903 Segmental and somatic dysfunction of lumbar region: Secondary | ICD-10-CM | POA: Diagnosis not present

## 2019-11-23 DIAGNOSIS — M5134 Other intervertebral disc degeneration, thoracic region: Secondary | ICD-10-CM | POA: Diagnosis not present

## 2019-11-24 DIAGNOSIS — M9903 Segmental and somatic dysfunction of lumbar region: Secondary | ICD-10-CM | POA: Diagnosis not present

## 2019-11-24 DIAGNOSIS — M9902 Segmental and somatic dysfunction of thoracic region: Secondary | ICD-10-CM | POA: Diagnosis not present

## 2019-11-24 DIAGNOSIS — M5134 Other intervertebral disc degeneration, thoracic region: Secondary | ICD-10-CM | POA: Diagnosis not present

## 2019-11-24 NOTE — Progress Notes (Signed)
To discuss review and upcoming visit   a1c a bit better   thyroid in range

## 2019-11-26 DIAGNOSIS — M5134 Other intervertebral disc degeneration, thoracic region: Secondary | ICD-10-CM | POA: Diagnosis not present

## 2019-11-26 DIAGNOSIS — M9903 Segmental and somatic dysfunction of lumbar region: Secondary | ICD-10-CM | POA: Diagnosis not present

## 2019-11-26 DIAGNOSIS — M9902 Segmental and somatic dysfunction of thoracic region: Secondary | ICD-10-CM | POA: Diagnosis not present

## 2019-11-27 ENCOUNTER — Telehealth: Payer: Self-pay | Admitting: Gastroenterology

## 2019-11-27 ENCOUNTER — Encounter: Payer: Self-pay | Admitting: Internal Medicine

## 2019-11-27 ENCOUNTER — Ambulatory Visit (INDEPENDENT_AMBULATORY_CARE_PROVIDER_SITE_OTHER): Payer: PPO | Admitting: Internal Medicine

## 2019-11-27 ENCOUNTER — Other Ambulatory Visit: Payer: Self-pay

## 2019-11-27 VITALS — BP 110/56 | HR 83 | Temp 97.5°F | Ht 64.96 in | Wt 152.8 lb

## 2019-11-27 DIAGNOSIS — R350 Frequency of micturition: Secondary | ICD-10-CM

## 2019-11-27 DIAGNOSIS — E038 Other specified hypothyroidism: Secondary | ICD-10-CM | POA: Diagnosis not present

## 2019-11-27 DIAGNOSIS — Z87440 Personal history of urinary (tract) infections: Secondary | ICD-10-CM

## 2019-11-27 DIAGNOSIS — E1165 Type 2 diabetes mellitus with hyperglycemia: Secondary | ICD-10-CM

## 2019-11-27 DIAGNOSIS — Z79899 Other long term (current) drug therapy: Secondary | ICD-10-CM | POA: Diagnosis not present

## 2019-11-27 DIAGNOSIS — Z23 Encounter for immunization: Secondary | ICD-10-CM | POA: Diagnosis not present

## 2019-11-27 DIAGNOSIS — R7989 Other specified abnormal findings of blood chemistry: Secondary | ICD-10-CM

## 2019-11-27 LAB — POCT URINALYSIS DIPSTICK
Bilirubin, UA: NEGATIVE
Blood, UA: NEGATIVE
Glucose, UA: POSITIVE — AB
Ketones, UA: NEGATIVE
Leukocytes, UA: NEGATIVE
Nitrite, UA: NEGATIVE
Protein, UA: NEGATIVE
Spec Grav, UA: 1.01 (ref 1.010–1.025)
Urobilinogen, UA: 0.2 E.U./dL
pH, UA: 6 (ref 5.0–8.0)

## 2019-11-27 NOTE — Telephone Encounter (Signed)
Pt called stating that she received a messages in Mychart saying that she is overdue for colon. Recall tab in Epic has pt due in 2023. Pls call pt to clarify. It is ok to leave a detailed message.

## 2019-11-27 NOTE — Telephone Encounter (Signed)
Left the patient a detailed message.

## 2019-11-27 NOTE — Patient Instructions (Signed)
Glad you are doing better .   No change in meds at this time .   Plan rov in 4 mos  Or as needed   Flu vaccine today

## 2019-11-27 NOTE — Telephone Encounter (Signed)
The pt has been scheduled and instructed for EGD at the hospital

## 2019-11-27 NOTE — Telephone Encounter (Signed)
She is scheduled for the repeat EGD with [facility]. I am not clear on the colonoscopy. Please review and let me know.

## 2019-11-27 NOTE — Telephone Encounter (Signed)
Dr. Rush Landmark did the surveillance colonoscopy in 2020, there is no residual polyp tissue.  Recommended recall in 3 years, she is due in August 2023, please schedule recall colonoscopy with me. She is due for surveillance EGD with Dr. Rush Landmark, I will forward to [facility] so she can get on his schedule.  Thank you

## 2019-11-27 NOTE — Progress Notes (Signed)
Chief Complaint  Patient presents with  . Follow-up    pain when urinating and cloudy urine yesterday, and urinary frequency   . Diabetes  . Medication Management  . Hypothyroidism    HPI: Morgan [name] 69 y.o. come in for Chronic disease management   bg doing better  .   Continuing to lose weight easier.  Likes ozempic  Results   Thyroid some help fatigue taking reg   Fatigue : nocturia and  Poodle puppy   To have gi fu December .   Due for  Colon.  Having urinary sx off and on not today but dysuria recent and cloudy not sure if uti . Has hs of recurrent UTI   ROS: See pertinent positives and negatives per HPI. No numbness eye changes  Past Medical History:  Diagnosis Date  . Anemia    as a child   . CAD, NATIVE VESSEL 05/13/2009  . Cataracts, bilateral    MD just watching  . Complication of anesthesia   . DIABETES MELLITUS, TYPE II 12/09/2009  . Gastric ulcer   . Gastritis   . GERD (gastroesophageal reflux disease)   . Hx of abnormal cervical Pap smear    cryo  but normal since then   . HYPERLIPIDEMIA-MIXED 04/14/2008  . Intestinal metaplasia of gastric mucosa 2012  . Myocardial infarction ([place])    2003  . Obesity   . Occult blood in stools   . Osteopenia   . OVERWEIGHT/OBESITY 05/13/2009  . Ulcer    Duodenal ulcer by x-ray 40 years ago  . Varicose veins    under rx [facility] 2016    Family History  Problem Relation Age of Onset  . Emphysema Mother        Smoker  . Heart attack Mother        due to head injury  . Alcohol abuse Mother   . Heart disease Mother   . Early death Mother   . Varicose Veins Mother   . AAA (abdominal aortic aneurysm) Mother   . Lung cancer Father   . Heart attack Father   . Hypertension Father   . Alcohol abuse Father   . Heart disease Father        before age 42  . Aneurysm Father   . Hyperlipidemia Father   . Alcohol abuse Brother   . Diabetes Brother   . Heart disease Brother   . Heart attack Brother   .  Diabetes Brother   . Aneurysm Paternal Grandmother   . Diabetes Other        grandfather  . Sudden Cardiac Death Brother   . Colon cancer Neg Hx   . Esophageal cancer Neg Hx   . Stomach cancer Neg Hx   . Inflammatory bowel disease Neg Hx   . Liver disease Neg Hx   . Pancreatic cancer Neg Hx   . Rectal cancer Neg Hx     Social History   Socioeconomic History  . Marital status: Widowed    Spouse name: Not on file  . Number of children: 2  . Years of education: phd   . Highest education level: Not on file  Occupational History  . Occupation: Mental Health Counselor    Comment: [employer]  . Occupation: Nurse, adult: [employer]  Tobacco Use  . Smoking status: Former Smoker    Types: Cigarettes    Quit date: 02/05/1994    Years since quitting:  25.8  . Smokeless tobacco: Never Used  Vaping Use  . Vaping Use: Never used  Substance and Sexual Activity  . Alcohol use: No    Alcohol/week: 0.0 standard drinks  . Drug use: No  . Sexual activity: Not on file  Other Topics Concern  . 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 Self employed. PHD education fom [employer].    No firearms    NO reg exercise    Pets- Poodles 2    Sleep 7 hours    Social Determinants of Health   Financial Resource Strain: Low Risk   . Difficulty of Paying Living Expenses: Not hard at all  Food Insecurity: No Food Insecurity  . Worried About Charity fundraiser in the Last Year: Never true  . Ran Out of Food in the Last Year: Never true  Transportation Needs: No Transportation Needs  . Lack of Transportation (Medical): No  . Lack of Transportation (Non-Medical): No  Physical Activity: Inactive  . Days of Exercise per Week: 0 days  . Minutes of Exercise per Session: 0 min  Stress: No Stress Concern  Present  . Feeling of Stress : Not at all  Social Connections: Moderately Isolated  . Frequency of Communication with Friends and Family: More than three times a week  . Frequency of Social Gatherings with Friends and Family: More than three times a week  . Attends Religious Services: Never  . Active Member of Clubs or Organizations: Yes  . Attends Archivist Meetings: More than 4 times per year  . Marital Status: Widowed    Outpatient Medications Prior to Visit  Medication Sig Dispense Refill  . acetaminophen (TYLENOL) 500 MG tablet Take 1,000 mg by mouth every 6 (six) hours as needed for moderate pain or headache.    Marland Kitchen aspirin 81 MG tablet Take 81 mg by mouth daily.      Marland Kitchen atorvastatin (LIPITOR) 80 MG tablet TAKE 1 TABLET BY MOUTH EVERY DAY AT 6 pm 90 tablet 1  . canagliflozin (INVOKANA) 300 MG TABS tablet TAKE 1 TABLET BY MOUTH EVERY DAY BEFORE BREAKFAST 90 tablet 3  . Cholecalciferol (VITAMIN D3) 125 MCG (5000 UT) CAPS Take 5,000 Units by mouth daily.    . clindamycin (CLEOCIN T) 1 % lotion Apply 1 application topically daily.    . clindamycin (CLEOCIN) 300 MG capsule TAKE 2 CAPSULES BY MOUTH 1 HOUR PRIOR TO DENTAL APPOINTMENT & 2 CAPSULES IMMEDIATELY FOLLOWING APPOINTMENT (Patient not taking: Reported on 11/11/2019) 12 capsule 0  . Cranberry-Vitamin C-Vitamin E (CRANBERRY PLUS VITAMIN C) 4200-20-3 MG-MG-UNIT CAPS Take 2 capsules by mouth daily.    . Cyanocobalamin (B-12 PO) Take 2 tablets by mouth daily. 1000 mcg per    . ezetimibe (ZETIA) 10 MG tablet TAKE 1 TABLET BY MOUTH EVERY DAY 90 tablet 3  . Ferrous Sulfate (IRON) 325 (65 Fe) MG TABS Take 650 mg by mouth daily.     Elmore Guise Devices (ONE TOUCH DELICA LANCING DEV) MISC Use as directed to check blood sugars twice to four time per day. Dx E11.9 1 each 11  . Lancets (ONETOUCH DELICA PLUS AYTKZS01U) MISC Check blood sugar 2 to 3 times daily. 100  each 3  . levothyroxine (SYNTHROID) 50 MCG tablet Take 1 tablet (50 mcg total)  by mouth daily. 90 tablet 0  . metFORMIN (GLUCOPHAGE-XR) 500 MG 24 hr tablet TAKE 2 TABLETS BY MOUTH 2 TIMES DAILY WITH A MEAL 360 tablet 3  . Multiple Vitamins-Minerals (MULTIVITAMIN PO) Take 1 tablet by mouth daily.     . Omega-3 Fatty Acids (FISH OIL) 1000 MG CAPS Take 2,000 mg by mouth daily.    Marland Kitchen omeprazole (PRILOSEC) 40 MG capsule Take 1 capsule (40 mg total) by mouth 2 (two) times daily. 180 capsule 0  . ONETOUCH ULTRA test strip USE TO CHECK BLOOD SUGAR 2 TO 3 TIMES DAILY 100 strip 3  . Semaglutide, 1 MG/DOSE, (OZEMPIC, 1 MG/DOSE,) 2 MG/1.5ML SOPN Inject 0.75 mLs (1 mg total) into the skin once a week. 12 mL 5   No facility-administered medications prior to visit.     EXAM:  BP (!) 110/56   Pulse 83   Temp (!) 97.5 F (36.4 C)   Ht 5' 4.96" (1.65 m)   Wt 152 lb 12.8 oz (69.3 kg)   LMP  (LMP Unknown)   SpO2 96%   BMI 25.46 kg/m   Body mass index is 25.46 kg/m. Wt Readings from Last 3 Encounters:  11/27/19 152 lb 12.8 oz (69.3 kg)  11/11/19 150 lb (68 kg)  07/24/19 158 lb 12.8 oz (72 kg)    GENERAL: vitals reviewed and listed above, alert, oriented, appears well hydrated and in no acute distress HEENT: atraumatic, conjunctiva  clear, no obvious abnormalities on inspection of external nose and ears OP : masked  NECK: no obvious masses on inspection palpation  LUNGS: clear to auscultation bilaterally, no wheezes, rales or rhonchi, good air movement CV: HRRR, no clubbing cyanosis or  peripheral edema nl cap refill  Abdomen:  Sof,t normal bowel sounds without hepatosplenomegaly, no guarding rebound or masses no CVA tenderness MS: moves all extremities without noticeable focal  abnormality PSYCH: pleasant and cooperative, no obvious depression or anxiety Lab Results  Component Value Date   WBC 5.6 07/10/2019   HGB 14.8 07/10/2019   HCT 42.8 07/10/2019   PLT 190.0 07/10/2019   GLUCOSE 171 (H) 07/10/2019   CHOL 130 07/10/2019   TRIG 78.0 07/10/2019   HDL 44.80  07/10/2019   LDLCALC 69 07/10/2019   ALT 25 07/10/2019   AST 17 07/10/2019   NA 136 07/10/2019   K 4.3 07/10/2019   CL 98 07/10/2019   CREATININE 0.66 07/10/2019   BUN 10 07/10/2019   CO2 29 07/10/2019   TSH 2.49 11/20/2019   INR 0.9 01/13/2018   HGBA1C 7.2 (A) 11/20/2019   MICROALBUR 1.5 07/10/2019   BP Readings from Last 3 Encounters:  11/27/19 (!) 110/56  07/24/19 118/74  05/08/19 114/64   Urinalysis    Component Value Date/Time   COLORURINE YELLOW 04/26/2011 0752   APPEARANCEUR CLEAR 04/26/2011 0752   LABSPEC <=1.005 07/26/2018 1057   PHURINE 5.5 07/26/2018 1057   GLUCOSEU 500 (A) 07/26/2018 1057   GLUCOSEU NEGATIVE 04/26/2011 0752   HGBUR MODERATE (A) 07/26/2018 1057   BILIRUBINUR n 11/27/2019 0904   KETONESUR NEGATIVE 07/26/2018 1057   PROTEINUR Negative 11/27/2019 0904   PROTEINUR NEGATIVE 07/26/2018 1057   UROBILINOGEN 0.2 11/27/2019 0904   UROBILINOGEN 0.2 07/26/2018 1057   NITRITE neg 11/27/2019 0904   NITRITE NEGATIVE 07/26/2018 1057   LEUKOCYTESUR Negative 11/27/2019 0904   LEUKOCYTESUR SMALL (A) 07/26/2018 1057      ASSESSMENT AND  PLAN:  Discussed the following assessment and plan:  Type 2 diabetes mellitus with hyperglycemia, unspecified whether long term insulin use ([place])  Need for vaccination - Plan: Flu Vaccine QUAD High Dose(Fluad)  Urine frequency - Plan: POC Urinalysis Dipstick  History of recurrent UTIs  Elevated TSH  Subclinical hypothyroidism  Medication management Over all improved     urinary sx  No uti today  looke into [facility] at home for pre screen for uti if gets new sx  Plan rov in about 4 mos  a1c  If want to do yearly labs pre visit contact  Me for orders .  Flu vaccine today  -Patient advised to return or notify health care team  if  new concerns arise. 32 min review visit counsel and fu Patient Instructions  Glad you are doing better .   No change in meds at this time .   Plan rov in 4 mos  Or as needed   Flu  vaccine today     Mariann Laster K. Chinenye Katzenberger M.D.

## 2019-12-04 ENCOUNTER — Other Ambulatory Visit: Payer: Self-pay | Admitting: Internal Medicine

## 2019-12-15 ENCOUNTER — Other Ambulatory Visit: Payer: Self-pay | Admitting: Internal Medicine

## 2019-12-22 ENCOUNTER — Other Ambulatory Visit: Payer: Self-pay | Admitting: Internal Medicine

## 2020-01-11 ENCOUNTER — Other Ambulatory Visit (HOSPITAL_COMMUNITY)
Admission: RE | Admit: 2020-01-11 | Discharge: 2020-01-11 | Disposition: A | Discharge status: Home / Self Care | Payer: PPO | Source: Ambulatory Visit | Attending: Gastroenterology | Admitting: Gastroenterology

## 2020-01-11 DIAGNOSIS — Z20822 Contact with and (suspected) exposure to covid-19: Secondary | ICD-10-CM | POA: Insufficient documentation

## 2020-01-11 DIAGNOSIS — Z01812 Encounter for preprocedural laboratory examination: Secondary | ICD-10-CM | POA: Insufficient documentation

## 2020-01-11 LAB — SARS CORONAVIRUS 2 (TAT 6-24 HRS): SARS Coronavirus 2: NEGATIVE

## 2020-01-13 ENCOUNTER — Encounter (HOSPITAL_COMMUNITY): Payer: Self-pay | Admitting: Gastroenterology

## 2020-01-13 NOTE — Progress Notes (Signed)
Unable to reach patient.  Left detailed instructions for DOS on voicemail.  Advised patient to call back with any questions or concerns.  PCP - Dr Shanon Ace Cardiologist - Dr Stanford Breed  Chest x-ray - n/a EKG - 05/08/19 Stress Test - 10/29/14 ECHO - 03/11/02 Cardiac Cath - n/a  . Do not take oral diabetes medicines (Invokana, metformin) the morning of surgery.  . If your blood sugar is less than 70 mg/dL, you will need to treat for low blood sugar: o Treat a low blood sugar (less than 70 mg/dL) with  cup of clear juice (cranberry or apple), 4 glucose tablets, OR glucose gel. o Recheck blood sugar in 15 minutes after treatment (to make sure it is greater than 70 mg/dL). If your blood sugar is not greater than 70 mg/dL on recheck, call [phone] for further instructions.  Aspirin Instructions: Follow your surgeon's instructions on when to stop aspirin prior to surgery.  Anesthesia review: Yes  STOP now taking any Aspirin (unless otherwise instructed by your surgeon), Aleve, Naproxen, Ibuprofen, Motrin, Advil, Goody's, BC's, all herbal medications, fish oil, and all vitamins.   Coronavirus Screening Covid test on 01/11/20 was negative.

## 2020-01-13 NOTE — Progress Notes (Signed)
Anesthesia Chart Review: Same day workup  Follows with cardiology for hx of CAD. Previous PCI of her RCA following myocardial infarction in 2003. Echocardiogram in 2004 showed normal LV function. Abd ultrasound 11/15 showed no aneurysm. Nuclear study 9/16 showed EF 57 and inability to evaluate inferior wall but otherwise normal perfusion.Carotid Dopplers October 2017 showed 1-39% stenosis. There is note of abnormal thyroid tissue and dedicated thyroid ultrasound suggested. Thyroid ultrasound November 2017 showed borderline enlargement with moderately heterogeneous thyroid with no discrete nodule.Last seen by Dr. Stanford Breed 05/08/19. Per note, "Since last seen,the patient denies any dyspnea on exertion, orthopnea, PND, pedal edema, palpitations, syncope or chest pain...coronary artery disease-no chest pain.  Plan to continue medical therapy with aspirin and statin."  DMII, last A1c 7.6 on 07/10/19.  Will need DOS labs and eval.   Nuclear stress 10/29/14:   The left ventricular ejection fraction is normal (55-65%).  Nuclear stress EF: 57%.  There was no ST segment deviation noted during stress.  No T wave inversion was noted during stress.  This is a low risk study.   The inferior wall cannot be evaluated on the current study. Low risk stress nuclear study with otherwise normal perfusion and normal left ventricular regional and global systolic function. The previous study in 2011 described inferior wall scar with peri-infarct ischemia, but normal LVEF (73%)   Wynonia Musty [facility] Short Stay [facility]/Anesthesiology Phone [phone] 01/13/2020 11:22 AM

## 2020-01-13 NOTE — Anesthesia Preprocedure Evaluation (Addendum)
Anesthesia Evaluation  Patient identified by MRN, date of birth, ID band Patient awake    Reviewed: Allergy & Precautions, NPO status , Patient's Chart, lab work & pertinent test results  History of Anesthesia Complications (+) DIFFICULT AIRWAYHistory of anesthetic complications: h/o difficult airway during ectopic preg surgery.  Airway Mallampati: II  TM Distance: >3 FB Neck ROM: Full    Dental no notable dental hx.    Pulmonary former smoker,    Pulmonary exam normal breath sounds clear to auscultation       Cardiovascular + CAD and + Past MI  Normal cardiovascular exam+ dysrhythmias  Rhythm:Regular Rate:Normal     Neuro/Psych negative neurological ROS  negative psych ROS   GI/Hepatic PUD, GERD  Medicated,  Endo/Other  diabetesObesity hyperlipidemia  Renal/GU   negative genitourinary   Musculoskeletal negative musculoskeletal ROS (+)   Abdominal   Peds negative pediatric ROS (+)  Hematology  (+) anemia ,   Anesthesia Other Findings   Reproductive/Obstetrics negative OB ROS                          Anesthesia Physical Anesthesia Plan  ASA: III  Anesthesia Plan: MAC   Post-op Pain Management:    Induction:   PONV Risk Score and Plan: 2 and Propofol infusion and Treatment may vary due to age or medical condition  Airway Management Planned: Natural Airway and Simple Face Mask  Additional Equipment:   Intra-op Plan:   Post-operative Plan: Extubation in OR  Informed Consent: I have reviewed the patients History and Physical, chart, labs and discussed the procedure including the risks, benefits and alternatives for the proposed anesthesia with the patient or authorized representative who has indicated his/her understanding and acceptance.     Dental advisory given  Plan Discussed with: CRNA and Anesthesiologist  Anesthesia Plan Comments: (PAT note by Karoline Caldwell,  PA-C: Follows with cardiology for hx of CAD. Previous PCI of her RCA following myocardial infarction in 2003. Echocardiogram in 2004 showed normal LV function. Abd ultrasound 11/15 showed no aneurysm. Nuclear study 9/16 showed EF 57 and inability to evaluate inferior wall but otherwise normal perfusion.Carotid Dopplers October 2017 showed 1-39% stenosis. There is note of abnormal thyroid tissue and dedicated thyroid ultrasound suggested. Thyroid ultrasound November 2017 showed borderline enlargement with moderately heterogeneous thyroid with no discrete nodule.Last seen by Dr. Stanford Breed 05/08/19. Per note, "Since last seen,the patient denies any dyspnea on exertion, orthopnea, PND, pedal edema, palpitations, syncope or chest pain...coronary artery disease-no chest pain.  Plan to continue medical therapy with aspirin and statin."  DMII, last A1c 7.6 on 07/10/19.  Will need DOS labs and eval.   Nuclear stress 10/29/14:  The left ventricular ejection fraction is normal (55-65%). Nuclear stress EF: 57%. There was no ST segment deviation noted during stress. No T wave inversion was noted during stress. This is a low risk study.   The inferior wall cannot be evaluated on the current study. Low risk stress nuclear study with otherwise normal perfusion and normal left ventricular regional and global systolic function. The previous study in 2011 described inferior wall scar with peri-infarct ischemia, but normal LVEF (73%))      Anesthesia Quick Evaluation

## 2020-01-14 ENCOUNTER — Encounter (HOSPITAL_COMMUNITY)
Admission: RE | Disposition: A | Discharge status: Home / Self Care | Payer: Self-pay | Source: Ambulatory Visit | Attending: Gastroenterology

## 2020-01-14 ENCOUNTER — Ambulatory Visit (HOSPITAL_COMMUNITY): Payer: PPO | Admitting: Physician Assistant

## 2020-01-14 ENCOUNTER — Ambulatory Visit (HOSPITAL_COMMUNITY)
Admission: RE | Admit: 2020-01-14 | Discharge: 2020-01-14 | Disposition: A | Discharge status: Home / Self Care | Payer: PPO | Source: Ambulatory Visit | Attending: Gastroenterology | Admitting: Gastroenterology

## 2020-01-14 ENCOUNTER — Encounter (HOSPITAL_COMMUNITY): Payer: Self-pay | Admitting: Gastroenterology

## 2020-01-14 DIAGNOSIS — K2950 Unspecified chronic gastritis without bleeding: Secondary | ICD-10-CM | POA: Diagnosis not present

## 2020-01-14 DIAGNOSIS — K3189 Other diseases of stomach and duodenum: Secondary | ICD-10-CM

## 2020-01-14 DIAGNOSIS — Z87891 Personal history of nicotine dependence: Secondary | ICD-10-CM | POA: Insufficient documentation

## 2020-01-14 DIAGNOSIS — I252 Old myocardial infarction: Secondary | ICD-10-CM | POA: Diagnosis not present

## 2020-01-14 DIAGNOSIS — Z880 Allergy status to penicillin: Secondary | ICD-10-CM | POA: Insufficient documentation

## 2020-01-14 DIAGNOSIS — K449 Diaphragmatic hernia without obstruction or gangrene: Secondary | ICD-10-CM | POA: Insufficient documentation

## 2020-01-14 DIAGNOSIS — K317 Polyp of stomach and duodenum: Secondary | ICD-10-CM | POA: Diagnosis not present

## 2020-01-14 DIAGNOSIS — K31A19 Gastric intestinal metaplasia without dysplasia, unspecified site: Secondary | ICD-10-CM | POA: Diagnosis not present

## 2020-01-14 DIAGNOSIS — K2289 Other specified disease of esophagus: Secondary | ICD-10-CM | POA: Insufficient documentation

## 2020-01-14 DIAGNOSIS — Z09 Encounter for follow-up examination after completed treatment for conditions other than malignant neoplasm: Secondary | ICD-10-CM | POA: Diagnosis not present

## 2020-01-14 HISTORY — PX: BIOPSY: SHX5522

## 2020-01-14 HISTORY — PX: ESOPHAGOGASTRODUODENOSCOPY (EGD) WITH PROPOFOL: SHX5813

## 2020-01-14 HISTORY — PX: POLYPECTOMY: SHX5525

## 2020-01-14 SURGERY — ESOPHAGOGASTRODUODENOSCOPY (EGD) WITH PROPOFOL
Anesthesia: Monitor Anesthesia Care

## 2020-01-14 MED ORDER — PROPOFOL 10 MG/ML IV BOLUS
INTRAVENOUS | Status: DC | PRN
  Administered 2020-01-14 (×2): 10 mg via INTRAVENOUS
  Administered 2020-01-14: 20 mg via INTRAVENOUS

## 2020-01-14 MED ORDER — PROPOFOL 500 MG/50ML IV EMUL
INTRAVENOUS | Status: DC | PRN
  Administered 2020-01-14: 125 ug/kg/min via INTRAVENOUS

## 2020-01-14 MED ORDER — LIDOCAINE 2% (20 MG/ML) 5 ML SYRINGE
INTRAMUSCULAR | Status: DC | PRN
  Administered 2020-01-14: 60 mg via INTRAVENOUS

## 2020-01-14 MED ORDER — SODIUM CHLORIDE 0.9 % IV SOLN: INTRAVENOUS | Status: DC

## 2020-01-14 SURGICAL SUPPLY — 15 items

## 2020-01-14 NOTE — Anesthesia Postprocedure Evaluation (Signed)
Anesthesia Post Note  Patient: Morgan [name]  Procedure(s) Performed: ESOPHAGOGASTRODUODENOSCOPY (EGD) WITH PROPOFOL (N/A ) BIOPSY POLYPECTOMY     Patient location during evaluation: PACU Anesthesia Type: MAC Level of consciousness: awake and alert Pain management: pain level controlled Vital Signs Assessment: post-procedure vital signs reviewed and stable Respiratory status: spontaneous breathing and respiratory function stable Cardiovascular status: stable Postop Assessment: no apparent nausea or vomiting Anesthetic complications: no   No complications documented.  Last Vitals:  Vitals:   01/14/20 0825 01/14/20 0835  BP: (!) 132/59 (!) 133/57  Pulse: 62 67  Resp: 18 15  Temp:    SpO2: 99% 99%    Last Pain:  Vitals:   01/14/20 0835  TempSrc:   PainSc: 0-No pain                 Merlinda Frederick

## 2020-01-14 NOTE — Discharge Instructions (Signed)
Upper Endoscopy, Adult, Care After This sheet gives you information about how to care for yourself after your procedure. Your health care provider may also give you more specific instructions. If you have problems or questions, contact your health care provider. What can I expect after the procedure? After the procedure, it is common to have:  A sore throat.  Mild stomach pain or discomfort.  Bloating.  Nausea. Follow these instructions at home:   Follow instructions from your health care provider about what to eat or drink after your procedure.  Return to your normal activities as told by your health care provider. Ask your health care provider what activities are safe for you.  Take over-the-counter and prescription medicines only as told by your health care provider.  Do not drive for 24 hours if you were given a sedative during your procedure.  Keep all follow-up visits as told by your health care provider. This is important. Contact a health care provider if you have:  A sore throat that lasts longer than one day.  Trouble swallowing. Get help right away if:  You vomit blood or your vomit looks like coffee grounds.  You have: ? A fever. ? Bloody, black, or tarry stools. ? A severe sore throat or you cannot swallow. ? Difficulty breathing. ? Severe pain in your chest or abdomen. Summary  After the procedure, it is common to have a sore throat, mild stomach discomfort, bloating, and nausea.  Do not drive for 24 hours if you were given a sedative during the procedure.  Follow instructions from your health care provider about what to eat or drink after your procedure.  Return to your normal activities as told by your health care provider. This information is not intended to replace advice given to you by your health care provider. Make sure you discuss any questions you have with your health care provider. Document Revised: 07/16/2017 Document Reviewed:  06/24/2017 Elsevier Patient Education  [place]. Please restart taking Omeprazole 40 mg twice daily as per prescription. Minimize Non-steroidal use for next 2-weeks to decrease risk of bleeding.

## 2020-01-14 NOTE — Anesthesia Procedure Notes (Signed)
Procedure Name: MAC Date/Time: 01/14/2020 7:37 AM Performed by: Janene Harvey, CRNA Pre-anesthesia Checklist: Patient identified, Emergency Drugs available, Suction available and Patient being monitored Patient Re-evaluated:Patient Re-evaluated prior to induction Oxygen Delivery Method: Nasal cannula Induction Type: IV induction Placement Confirmation: positive ETCO2 Dental Injury: Teeth and Oropharynx as per pre-operative assessment

## 2020-01-14 NOTE — Transfer of Care (Signed)
Immediate Anesthesia Transfer of Care Note  Patient: Morgan [name]  Procedure(s) Performed: ESOPHAGOGASTRODUODENOSCOPY (EGD) WITH PROPOFOL (N/A ) BIOPSY POLYPECTOMY  Patient Location: Endoscopy Unit  Anesthesia Type:MAC  Level of Consciousness: drowsy and patient cooperative  Airway & Oxygen Therapy: Patient Spontanous Breathing and Patient connected to nasal cannula oxygen  Post-op Assessment: Report given to RN and Post -op Vital signs reviewed and stable  Post vital signs: Reviewed  Last Vitals:  Vitals Value Taken Time  BP    Temp    Pulse 64 01/14/20 0815  Resp 20 01/14/20 0815  SpO2 100 % 01/14/20 0815  Vitals shown include unvalidated device data.  Last Pain:  Vitals:   01/14/20 0709  TempSrc: Oral  PainSc: 0-No pain         Complications: No complications documented.

## 2020-01-14 NOTE — Op Note (Signed)
[facility] Patient Name: Morgan [name] Procedure Date : 01/14/2020 MRN: [id number] Attending MD: Justice Britain , MD Date of Birth: 1950/05/21 CSN: [id number] Age: 69 Admit Type: Outpatient Procedure:                Upper GI endoscopy Indications:              Follow-up of gastric polyps (previous gastric                            adenoma s/p resection in 2020 with negative 31-month                           follow up here for 1-year follow up) Providers:                GJustice Britain MD, JKary KosRN, RN,                            NLesia Sago Technician, CElspeth Cho[employer].,                            Technician Referring MD:             KMauri Pole MD, WStandley Brooking Panosh Medicines:                Monitored Anesthesia Care Complications:            No immediate complications. Estimated Blood Loss:     Estimated blood loss was minimal. Procedure:                Pre-Anesthesia Assessment:                           - Prior to the procedure, a History and Physical                            was performed, and patient medications and                            allergies were reviewed. The patient's tolerance of                            previous anesthesia was also reviewed. The risks                            and benefits of the procedure and the sedation                            options and risks were discussed with the patient.                            All questions were answered, and informed consent                            was obtained. Prior Anticoagulants: The patient has  taken no previous anticoagulant or antiplatelet                            agents except for aspirin. ASA Grade Assessment: II                            - A patient with mild systemic disease. After                            reviewing the risks and benefits, the patient was                            deemed in satisfactory condition  to undergo the                            procedure.                           After obtaining informed consent, the endoscope was                            passed under direct vision. Throughout the                            procedure, the patient's blood pressure, pulse, and                            oxygen saturations were monitored continuously. The                            GIF-H190 ([id number]) Olympus gastroscope was                            introduced through the mouth, and advanced to the                            second part of duodenum. The upper GI endoscopy was                            accomplished without difficulty. The patient                            tolerated the procedure. Scope In: Scope Out: Findings:      No gross lesions were noted in the proximal esophagus and in the mid       esophagus.      One tongue of salmon-colored mucosa was present from 35 to 37 cm. No       other visible abnormalities were present. Biopsies were taken with a       cold forceps for histology.      The Z-line was irregular and was found 37 cm from the incisors.      A 3 cm hiatal hernia was present.      One 10 mm semi-sessile polyp with no bleeding and no stigmata of recent       bleeding  was found on the greater curvature of the stomach - likely       fundic gland vs hyperplastic. The polyp was removed with a cold snare.       Resection and retrieval were complete.      A medium post mucosectomy scar was found in the prepyloric extending       into the pylorus. There was no residual polypoid tissue at the gastric       portion of the scar, however, there was some mild       protuberance/nodularity of tissue within the pyloric channel (query a       result of prolapse vs potential for recurrent adenoma. The scar site and       the region within the pylorus extending into the duodenum was removed       with a cold snare for histology purposes to ensure no evidence of        recurrent adenoma.      No other gross lesions were noted in the entire examined stomach.      No gross lesions were noted in the duodenal bulb, in the first portion       of the duodenum and in the second portion of the duodenum. Impression:               - No gross lesions in esophagus proximally.                            Salmon-colored mucosa tongue noted -biopsied to                            rule out Barrett's.                           - Z-line irregular, 37 cm from the incisors.                           - 3 cm hiatal hernia.                           - One gastric polyp - likely hyperplastic or fundic                            glan. Resected and retrieved.                           - Scar in the prepyloric to pylorus region. Some                            protuberance/nodularity within the pyloric channel                            noted. Region biopsied via cold snare to ensure no                            evidence of recurrent adenoma.                           - No other gross lesions in the  stomach.                           - No gross lesions in the duodenal bulb, in the                            first portion of the duodenum and in the second                            portion of the duodenum. Recommendation:           - The patient will be observed post-procedure,                            until all discharge criteria are met.                           - Discharge patient to home.                           - Full liquid diet today through lunch time. If                            doing well then soft diet for next 48 hours. Then                            may advance from there.                           - Please continue taking Omeprazole. 40 mg twice                            daily at this time for at least next month. If your                            other symptoms are doing well you may decrease back                            down to 40 mg daily  thereafter.                           - Observe patient's clinical course.                           - Await pathology results.                           - OK to restart medications today. Aspirin should                            be restarted tomorrow to decrease risk of bleeding                            post-interventions today.                           -  If there is no evidence of gastric adenoma on                            scar site biopsies, then repeat upper endoscopy in                            3 years for surveillance of region. If any                            recurrence then 1-year follow up EGD will be                            recommended. If there is evidence of Barrett's                            esophagus on biopsies then follow up will be                            determined based on pathology findings.                           - The findings and recommendations were discussed                            with the patient.                           - The findings and recommendations were discussed                            with the designated responsible adult. Procedure Code(s):        --- Professional ---                           [phone], Esophagogastroduodenoscopy, flexible,                            transoral; with removal of tumor(s), polyp(s), or                            other lesion(s) by snare technique Diagnosis Code(s):        --- Professional ---                           K22.8, Other specified diseases of esophagus                           K44.9, Diaphragmatic hernia without obstruction or                            gangrene                           K31.7, Polyp of stomach and duodenum  K31.89, Other diseases of stomach and duodenum CPT copyright 2019 American Medical Association. All rights reserved. The codes documented in this report are preliminary and upon coder review may  be revised to meet current compliance  requirements. Justice Britain, MD 01/14/2020 8:24:25 AM Number of Addenda: 0

## 2020-01-14 NOTE — H&P (Signed)
GASTROENTEROLOGY PROCEDURE H&P NOTE   Primary Care Physician: Burnis Medin, MD  HPI: Morgan [name] is a 69 y.o. female who presents for EGD for follow up of previous gastric adenoma s/p resection in 2020 with negative 78-month EGD and now 1 year later.  Past Medical History:  Diagnosis Date  . Anemia    as a child   . CAD, NATIVE VESSEL 05/13/2009  . Cataracts, bilateral    MD just watching  . Complication of anesthesia   . DIABETES MELLITUS, TYPE II 12/09/2009  . Gastric ulcer   . Gastritis   . GERD (gastroesophageal reflux disease)   . Hx of abnormal cervical Pap smear    cryo  but normal since then   . HYPERLIPIDEMIA-MIXED 04/14/2008  . Intestinal metaplasia of gastric mucosa 2012  . Myocardial infarction ([place])    2003  . Obesity   . Occult blood in stools   . Osteopenia   . OVERWEIGHT/OBESITY 05/13/2009  . Ulcer    Duodenal ulcer by x-ray 40 years ago  . Varicose veins    under rx [facility] 2016   Past Surgical History:  Procedure Laterality Date  . BIOPSY  09/29/2018   Procedure: BIOPSY;  Surgeon: Rush Landmark Telford Nab., MD;  Location: [facility];  Service: Gastroenterology;;  . CESAREAN SECTION     x2  . CLOSED REDUCTION PROXIMAL TIBIOFIBULAR JOINT [place]  2004   left  . COLONOSCOPY    . COLONOSCOPY WITH PROPOFOL N/A 09/29/2018   Procedure: COLONOSCOPY WITH PROPOFOL;  Surgeon: Rush Landmark Telford Nab., MD;  Location: [facility];  Service: Gastroenterology;  Laterality: N/A;  . DILATION AND CURETTAGE OF UTERUS    . ECTOPIC PREGNANCY SURGERY    . ESOPHAGOGASTRODUODENOSCOPY N/A 02/12/2018   Procedure: ESOPHAGOGASTRODUODENOSCOPY (EGD);  Surgeon: Irving Copas., MD;  Location: [facility];  Service: Gastroenterology;  Laterality: N/A;  . ESOPHAGOGASTRODUODENOSCOPY N/A 09/29/2018   Procedure: ESOPHAGOGASTRODUODENOSCOPY (EGD);  Surgeon: Irving Copas., MD;  Location: [facility];  Service: Gastroenterology;  Laterality: N/A;  . EUS N/A  02/12/2018   Procedure: UPPER ENDOSCOPIC ULTRASOUND (EUS) RADIAL;  Surgeon: Rush Landmark Telford Nab., MD;  Location: [facility];  Service: Gastroenterology;  Laterality: N/A;  . FIBULA FRACTURE SURGERY    . GANGLION CYST EXCISION    . POLYPECTOMY  02/12/2018   Procedure: POLYPECTOMY;  Surgeon: Mansouraty, Telford Nab., MD;  Location: [facility];  Service: Gastroenterology;;  . TONSILLECTOMY    . TUBAL LIGATION     One Tube  . UPPER GASTROINTESTINAL ENDOSCOPY     08/27/17   Current Facility-Administered Medications  Medication Dose Route Frequency Provider Last Rate Last Admin  . 0.9 %  sodium chloride infusion   Intravenous Continuous Mansouraty, Telford Nab., MD       Allergies  Allergen Reactions  . Penicillins Rash    Did it involve swelling of the face/tongue/throat, SOB, or low BP? No Did it involve sudden or severe rash/hives, skin peeling, or any reaction on the inside of your mouth or nose? No Did you need to seek medical attention at a hospital or doctor's office? No When did it last happen?childhood allergy If all above answers are "NO", may proceed with cephalosporin use.    Family History  Problem Relation Age of Onset  . Emphysema Mother        Smoker  . Heart attack Mother        due to head injury  . Alcohol abuse Mother   . Heart disease Mother   .  Early death Mother   . Varicose Veins Mother   . AAA (abdominal aortic aneurysm) Mother   . Lung cancer Father   . Heart attack Father   . Hypertension Father   . Alcohol abuse Father   . Heart disease Father        before age 38  . Aneurysm Father   . Hyperlipidemia Father   . Alcohol abuse Brother   . Diabetes Brother   . Heart disease Brother   . Heart attack Brother   . Diabetes Brother   . Aneurysm Paternal Grandmother   . Diabetes Other        grandfather  . Sudden Cardiac Death Brother   . Colon cancer Neg Hx   . Esophageal cancer Neg Hx   . Stomach cancer Neg Hx   . Inflammatory bowel  disease Neg Hx   . Liver disease Neg Hx   . Pancreatic cancer Neg Hx   . Rectal cancer Neg Hx    Social History   Socioeconomic History  . Marital status: Widowed    Spouse name: Not on file  . Number of children: 2  . Years of education: phd   . Highest education level: Not on file  Occupational History  . Occupation: Mental Health Counselor    Comment: [employer]  . Occupation: Nurse, adult: [place]  Tobacco Use  . Smoking status: Former Smoker    Types: Cigarettes    Quit date: 02/05/1994    Years since quitting: 25.9  . Smokeless tobacco: Never Used  Vaping Use  . Vaping Use: Never used  Substance and Sexual Activity  . Alcohol use: No    Alcohol/week: 0.0 standard drinks  . Drug use: No  . Sexual activity: Not on file  Other Topics Concern  . 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    No firearms    NO reg exercise    Pets- Poodles 2    Sleep 7 hours    Social Determinants of Health   Financial Resource Strain: Low Risk   . Difficulty of Paying Living Expenses: Not hard at all  Food Insecurity: No Food Insecurity  . Worried About Charity fundraiser in the Last Year: Never true  . Ran Out of Food in the Last Year: Never true  Transportation Needs: No Transportation Needs  . Lack of Transportation (Medical): No  . Lack of Transportation (Non-Medical): No  Physical Activity: Inactive  . Days of Exercise per Week: 0 days  . Minutes of Exercise per Session: 0 min  Stress: No Stress Concern Present  . Feeling of Stress : Not at all  Social Connections: Moderately Isolated  . Frequency of Communication with Friends and Family: More than three times a week  . Frequency of Social Gatherings with Friends and Family: More than  three times a week  . Attends Religious Services: Never  . Active Member of Clubs or Organizations: Yes  . Attends Archivist Meetings: More than 4 times per year  . Marital Status: Widowed  Intimate Partner Violence: Not At Risk  . Fear of Current  or Ex-Partner: No  . Emotionally Abused: No  . Physically Abused: No  . Sexually Abused: No    Physical Exam: Vital signs in last 24 hours: Temp:  [97.7 F (36.5 C)] 97.7 F (36.5 C) (12/09 0709) Pulse Rate:  [66] 66 (12/09 0709) Resp:  [13] 13 (12/09 0709) BP: (133)/(45) 133/45 (12/09 0709) SpO2:  [98 %] 98 % (12/09 0709) Weight:  [66.7 kg] 66.7 kg (12/09 0709)   GEN: NAD EYE: Sclerae anicteric ENT: MMM CV: Non-tachycardic GI: Soft, NT/ND NEURO:  Alert & Oriented x 3  Lab Results: No results for input(s): WBC, HGB, HCT, PLT in the last 72 hours. BMET No results for input(s): NA, K, CL, CO2, GLUCOSE, BUN, CREATININE, CALCIUM in the last 72 hours. LFT No results for input(s): PROT, ALBUMIN, AST, ALT, ALKPHOS, BILITOT, BILIDIR, IBILI in the last 72 hours. PT/INR No results for input(s): LABPROT, INR in the last 72 hours.   Impression / Plan: This is a 69 y.o.female who presents for EGD for follow up of previous gastric adenoma s/p resection in 2020 with negative 21-month EGD and now 1 year later.  The risks and benefits of endoscopic evaluation were discussed with the patient; these include but are not limited to the risk of perforation, infection, bleeding, missed lesions, lack of diagnosis, severe illness requiring hospitalization, as well as anesthesia and sedation related illnesses.  The patient is agreeable to proceed.    Justice Britain, MD [facility] Advanced [facility]oscopy Office # [phone]

## 2020-01-15 ENCOUNTER — Encounter: Payer: Self-pay | Admitting: Gastroenterology

## 2020-01-15 LAB — SURGICAL PATHOLOGY

## 2020-01-15 NOTE — Progress Notes (Signed)
LEFT MESSAGE

## 2020-01-17 ENCOUNTER — Encounter (HOSPITAL_COMMUNITY): Payer: Self-pay | Admitting: Gastroenterology

## 2020-01-18 DIAGNOSIS — F331 Major depressive disorder, recurrent, moderate: Secondary | ICD-10-CM | POA: Diagnosis not present

## 2020-02-16 DIAGNOSIS — F331 Major depressive disorder, recurrent, moderate: Secondary | ICD-10-CM | POA: Diagnosis not present

## 2020-03-01 ENCOUNTER — Encounter: Payer: Self-pay | Admitting: Internal Medicine

## 2020-03-07 DIAGNOSIS — M8588 Other specified disorders of bone density and structure, other site: Secondary | ICD-10-CM | POA: Diagnosis not present

## 2020-03-07 DIAGNOSIS — R309 Painful micturition, unspecified: Secondary | ICD-10-CM | POA: Diagnosis not present

## 2020-03-07 DIAGNOSIS — Z1231 Encounter for screening mammogram for malignant neoplasm of breast: Secondary | ICD-10-CM | POA: Diagnosis not present

## 2020-03-07 DIAGNOSIS — R300 Dysuria: Secondary | ICD-10-CM | POA: Diagnosis not present

## 2020-03-07 DIAGNOSIS — N958 Other specified menopausal and perimenopausal disorders: Secondary | ICD-10-CM | POA: Diagnosis not present

## 2020-03-07 DIAGNOSIS — Z6824 Body mass index (BMI) 24.0-24.9, adult: Secondary | ICD-10-CM | POA: Diagnosis not present

## 2020-03-07 DIAGNOSIS — Z124 Encounter for screening for malignant neoplasm of cervix: Secondary | ICD-10-CM | POA: Diagnosis not present

## 2020-03-29 ENCOUNTER — Other Ambulatory Visit: Payer: Self-pay

## 2020-03-30 MED ORDER — OMEPRAZOLE 40 MG PO CPDR
40.0000 mg | DELAYED_RELEASE_CAPSULE | Freq: Two times a day (BID) | ORAL | 2 refills | Status: DC
Start: 2020-03-30 — End: 2020-12-01

## 2020-04-01 ENCOUNTER — Ambulatory Visit: Payer: PPO | Admitting: Internal Medicine

## 2020-04-04 ENCOUNTER — Other Ambulatory Visit: Payer: Self-pay

## 2020-04-04 ENCOUNTER — Encounter: Payer: Self-pay | Admitting: Internal Medicine

## 2020-04-04 ENCOUNTER — Ambulatory Visit (INDEPENDENT_AMBULATORY_CARE_PROVIDER_SITE_OTHER): Payer: PPO | Admitting: Internal Medicine

## 2020-04-04 VITALS — BP 120/68 | HR 64 | Temp 97.5°F | Ht 65.0 in | Wt 148.6 lb

## 2020-04-04 DIAGNOSIS — E038 Other specified hypothyroidism: Secondary | ICD-10-CM | POA: Diagnosis not present

## 2020-04-04 DIAGNOSIS — E1165 Type 2 diabetes mellitus with hyperglycemia: Secondary | ICD-10-CM | POA: Diagnosis not present

## 2020-04-04 DIAGNOSIS — Z87440 Personal history of urinary (tract) infections: Secondary | ICD-10-CM | POA: Diagnosis not present

## 2020-04-04 DIAGNOSIS — E785 Hyperlipidemia, unspecified: Secondary | ICD-10-CM | POA: Diagnosis not present

## 2020-04-04 DIAGNOSIS — Z79899 Other long term (current) drug therapy: Secondary | ICD-10-CM

## 2020-04-04 LAB — POCT GLYCOSYLATED HEMOGLOBIN (HGB A1C): Hemoglobin A1C: 6.7 % — AB (ref 4.0–5.6)

## 2020-04-04 NOTE — Progress Notes (Signed)
Chief Complaint  Patient presents with  . Follow-up    4 month f/u meds. No concerns.    . Medication Management  . Diabetes    HPI: Morgan [name] 70 y.o. come in for Chronic disease management  Says she is doing better on low level Lexapro sleeping better. Blood sugar much better 140 is usually a high number mostly 118 Has had a UTI seen recently by Dr. Rochele Pages despite the negative over-the-counter strips.  She has a history of resistant arterial using Macrobid. Thinks the Invokana may be in the problem she never had recurrent UTIs before that medicine.  But realizes that it is helpful. She has been able to lose weight on the low-dose thyroid medicine in addition. ROS: See pertinent positives and negatives per HPI.  Past Medical History:  Diagnosis Date  . Anemia    as a child   . CAD, NATIVE VESSEL 05/13/2009  . Cataracts, bilateral    MD just watching  . Complication of anesthesia   . DIABETES MELLITUS, TYPE II 12/09/2009  . Gastric ulcer   . Gastritis   . GERD (gastroesophageal reflux disease)   . Hx of abnormal cervical Pap smear    cryo  but normal since then   . HYPERLIPIDEMIA-MIXED 04/14/2008  . Intestinal metaplasia of gastric mucosa 2012  . Myocardial infarction ([place])    2003  . Obesity   . Occult blood in stools   . Osteopenia   . OVERWEIGHT/OBESITY 05/13/2009  . Ulcer    Duodenal ulcer by x-ray 40 years ago  . Varicose veins    under rx lawson 2016    Family History  Problem Relation Age of Onset  . Emphysema Mother        Smoker  . Heart attack Mother        due to head injury  . Alcohol abuse Mother   . Heart disease Mother   . Early death Mother   . Varicose Veins Mother   . AAA (abdominal aortic aneurysm) Mother   . Lung cancer Father   . Heart attack Father   . Hypertension Father   . Alcohol abuse Father   . Heart disease Father        before age 94  . Aneurysm Father   . Hyperlipidemia Father   . Alcohol abuse Brother   .  Diabetes Brother   . Heart disease Brother   . Heart attack Brother   . Diabetes Brother   . Aneurysm Paternal Grandmother   . Diabetes Other        grandfather  . Sudden Cardiac Death Brother   . Colon cancer Neg Hx   . Esophageal cancer Neg Hx   . Stomach cancer Neg Hx   . Inflammatory bowel disease Neg Hx   . Liver disease Neg Hx   . Pancreatic cancer Neg Hx   . Rectal cancer Neg Hx     Social History   Socioeconomic History  . Marital status: Widowed    Spouse name: Not on file  . Number of children: 2  . Years of education: phd   . Highest education level: Not on file  Occupational History  . Occupation: Mental Health Counselor    Comment: [employer]  . Occupation: Nurse, adult: [employer]  Tobacco Use  . Smoking status: Former Smoker    Types: Cigarettes    Quit date: 02/05/1994    Years since quitting: 26.1  .  Smokeless tobacco: Never Used  Vaping Use  . Vaping Use: Never used  Substance and Sexual Activity  . Alcohol use: No    Alcohol/week: 0.0 standard drinks  . Drug use: No  . Sexual activity: Not on file  Other Topics Concern  . 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 PHD education fom [place].    No firearms    NO reg exercise    Pets- Poodles 2    Sleep 7 hours    Social Determinants of Health   Financial Resource Strain: Low Risk   . Difficulty of Paying Living Expenses: Not hard at all  Food Insecurity: No Food Insecurity  . Worried About Charity fundraiser in the Last Year: Never true  . Ran Out of Food in the Last Year: Never true  Transportation Needs: No Transportation Needs  . Lack of Transportation (Medical): No  . Lack of Transportation (Non-Medical): No  Physical Activity: Inactive  . Days of Exercise per Week: 0 days   . Minutes of Exercise per Session: 0 min  Stress: No Stress Concern Present  . Feeling of Stress : Not at all  Social Connections: Moderately Isolated  . Frequency of Communication with Friends and Family: More than three times a week  . Frequency of Social Gatherings with Friends and Family: More than three times a week  . Attends Religious Services: Never  . Active Member of Clubs or Organizations: Yes  . Attends Archivist Meetings: More than 4 times per year  . Marital Status: Widowed    Outpatient Medications Prior to Visit  Medication Sig Dispense Refill  . acetaminophen (TYLENOL) 500 MG tablet Take 1,000 mg by mouth every 6 (six) hours as needed for moderate pain or headache.    Marland Kitchen aspirin 81 MG tablet Take 81 mg by mouth daily.      Marland Kitchen atorvastatin (LIPITOR) 80 MG tablet TAKE 1 TABLET BY MOUTH DAILY AT 6 PM (Patient taking differently: Take 80 mg by mouth daily at 6 PM.) 90 tablet 2  . canagliflozin (INVOKANA) 300 MG TABS tablet TAKE 1 TABLET BY MOUTH EVERY DAY BEFORE BREAKFAST (Patient taking differently: Take 300 mg by mouth daily before breakfast.) 90 tablet 3  . Cholecalciferol (VITAMIN D3) 125 MCG (5000 UT) CAPS Take 5,000 Units by mouth daily.    . clindamycin (CLEOCIN T) 1 % lotion Apply 1 application topically daily.    . Cranberry-Vitamin C-Vitamin E (CRANBERRY PLUS VITAMIN C) 4200-20-3 MG-MG-UNIT CAPS Take 2 capsules by mouth daily.    Marland Kitchen escitalopram (LEXAPRO) 10 MG tablet Take 10 mg by mouth daily.    Marland Kitchen ezetimibe (ZETIA) 10 MG tablet TAKE 1 TABLET BY MOUTH EVERY DAY (Patient taking differently: Take 10 mg by mouth daily.) 90 tablet 3  . Ferrous Sulfate (IRON) 325 (65 Fe) MG TABS Take 650 mg by mouth daily.     Elmore Guise Devices (ONE TOUCH DELICA LANCING DEV) MISC Use as directed to check blood sugars twice to four time per day. Dx E11.9 1 each 11  . Lancets (ONETOUCH DELICA PLUS [id number]) MISC Check blood sugar 2 to 3 times daily. 100 each 3  . levothyroxine  (  SYNTHROID) 50 MCG tablet TAKE 1 TABLET BY MOUTH EVERY DAY (Patient taking differently: Take 50 mcg by mouth daily.) 90 tablet 3  . metFORMIN (GLUCOPHAGE-XR) 500 MG 24 hr tablet TAKE 2 TABLETS BY MOUTH 2 TIMES DAILY WITH A MEAL (Patient taking differently: Take 1,000 mg by mouth 2 (two) times daily with a meal.) 360 tablet 3  . Multiple Vitamins-Minerals (MULTIVITAMIN PO) Take 1 tablet by mouth daily.     . Omega-3 Fatty Acids (FISH OIL) 1000 MG CAPS Take 2,000 mg by mouth daily.    Marland Kitchen omeprazole (PRILOSEC) 40 MG capsule Take 1 capsule (40 mg total) by mouth 2 (two) times daily. 180 capsule 2  . ONETOUCH ULTRA test strip USE TO CHECK BLOOD SUGAR 2 TO 3 TIMES DAILY 100 strip 3  . Semaglutide, 1 MG/DOSE, (OZEMPIC, 1 MG/DOSE,) 2 MG/1.5ML SOPN Inject 0.75 mLs (1 mg total) into the skin once a week. (Patient taking differently: Inject 1 mg into the skin every Sunday.) 12 mL 5  . vitamin B-12 (CYANOCOBALAMIN) 1000 MCG tablet Take 2,000 mcg by mouth daily.     No facility-administered medications prior to visit.     EXAM:  BP 120/68   Pulse 64   Temp (!) 97.5 F (36.4 C) (Oral)   Ht 5\' 5"  (1.651 m)   Wt 148 lb 9.6 oz (67.4 kg)   LMP  (LMP Unknown)   SpO2 97%   BMI 24.73 kg/m   Body mass index is 24.73 kg/m.  GENERAL: vitals reviewed and listed above, alert, oriented, appears well hydrated and in no acute distress HEENT: atraumatic, conjunctiva  clear, no obvious abnormalities on inspection of external nose and ears OP : Masked NECK: no obvious masses on inspection palpation  LUNGS: clear to auscultation bilaterally, no wheezes, rales or rhonchi, good air movement CV: HRRR, no clubbing cyanosis or  peripheral edema nl cap refill few varicose veins Abdomen soft without organomegaly guarding rebound. MS: moves all extremities without noticeable focal  abnormality PSYCH: pleasant and cooperative, no obvious depression or anxiety Lab Results  Component Value Date   WBC 5.6 07/10/2019   HGB  14.8 07/10/2019   HCT 42.8 07/10/2019   PLT 190.0 07/10/2019   GLUCOSE 171 (H) 07/10/2019   CHOL 130 07/10/2019   TRIG 78.0 07/10/2019   HDL 44.80 07/10/2019   LDLCALC 69 07/10/2019   ALT 25 07/10/2019   AST 17 07/10/2019   NA 136 07/10/2019   K 4.3 07/10/2019   CL 98 07/10/2019   CREATININE 0.66 07/10/2019   BUN 10 07/10/2019   CO2 29 07/10/2019   TSH 2.49 11/20/2019   INR 0.9 01/13/2018   HGBA1C 7.2 (A) 11/20/2019   MICROALBUR 1.5 07/10/2019   BP Readings from Last 3 Encounters:  04/04/20 120/68  01/14/20 (!) 133/57  11/27/19 (!) 110/56   Wt Readings from Last 3 Encounters:  04/04/20 148 lb 9.6 oz (67.4 kg)  01/14/20 147 lb (66.7 kg)  11/27/19 152 lb 12.8 oz (69.3 kg)    ASSESSMENT AND PLAN:  Discussed the following assessment and plan:  Type 2 diabetes mellitus with hyperglycemia, unspecified whether long term insulin use ([place]) - Plan: Basic metabolic panel, CBC with Differential/Platelet, Hemoglobin A1c, Hepatic function panel, Lipid panel, TSH, Microalbumin / creatinine urine ratio  Subclinical hypothyroidism - Plan: Basic metabolic panel, CBC with Differential/Platelet, Hemoglobin A1c, Hepatic function panel, Lipid panel, TSH, Microalbumin / creatinine urine ratio  History of recurrent UTIs - Plan: Basic metabolic panel, CBC with Differential/Platelet, Hemoglobin  A1c, Hepatic function panel, Lipid panel, TSH, Microalbumin / creatinine urine ratio  Medication management - Plan: Basic metabolic panel, CBC with Differential/Platelet, Hemoglobin A1c, Hepatic function panel, Lipid panel, TSH, Microalbumin / creatinine urine ratio  Hyperlipidemia, unspecified hyperlipidemia type - Plan: Basic metabolic panel, CBC with Differential/Platelet, Hemoglobin A1c, Hepatic function panel, Lipid panel, TSH, Microalbumin / creatinine urine ratio A1c is much better under 7 she states she has not had such a good A1c in over 10 years.  Ozempic and weight loss has been helpful.  Due  for  Full labs  In June  And cpx then   uti off and on .   May be invokanna .  Can repeat discussed medicine at her CPX in June considering decreasing the dose changing in the class at a lower dose.  She could also talk with cardiology about advantage of cardiovascular risk. We will continue on thyroid medication Okay to get labs done before CPX in June -Patient advised to return or notify health care team  if  new concerns arise.  Patient Instructions  Glad you are doing well today .  Hg a1c is 6.7    May try to dec invokanna to every other day  Or  3 d per week and see how control is .  There is  100 mg  aviable but not sure if insurance covers     [employer]. Malek Skog M.D.

## 2020-04-04 NOTE — Patient Instructions (Addendum)
Glad you are doing well today .  Hg a1c is 6.7    May try to dec invokanna to every other day  Or  3 d per week and see how control is .  There is  100 mg  aviable but not sure if insurance covers

## 2020-04-04 NOTE — Addendum Note (Signed)
Addended by: Konrad Saha on: 04/04/2020 01:11 PM   Modules accepted: Orders

## 2020-04-18 ENCOUNTER — Encounter: Payer: Self-pay | Admitting: Internal Medicine

## 2020-04-18 ENCOUNTER — Other Ambulatory Visit: Payer: Self-pay | Admitting: Internal Medicine

## 2020-04-19 NOTE — Progress Notes (Signed)
Order(s) created erroneously. Erroneous order ID: [id number]  Order moved by: Genia Harold D  Order move date/time: 04/19/2020 10:31 AM  Source Patient: [id number]  Source Contact: 04/18/2020  Destination Patient: [id number]  Destination Contact: 09/24/2019

## 2020-04-25 DIAGNOSIS — F331 Major depressive disorder, recurrent, moderate: Secondary | ICD-10-CM | POA: Diagnosis not present

## 2020-05-09 ENCOUNTER — Other Ambulatory Visit: Payer: Self-pay | Admitting: Internal Medicine

## 2020-05-26 NOTE — Progress Notes (Signed)
HPI: FU CAD. Previous PCI of her RCA following myocardial infarction in 2003. Echocardiogram in 2004 showed normal LV function. Abd ultrasound 11/15 showed no aneurysm. Nuclear study 9/16 showed EF 57 and inability to evaluate inferior wall but otherwise normal perfusion.Carotid Dopplers October 2017 showed 1-39% stenosis. There is note of abnormal thyroid tissue and dedicated thyroid ultrasound suggested. Thyroid ultrasound November 2017 showed borderline enlargement with moderately heterogeneous thyroid with no discrete nodule.Since last seen,the patient denies any dyspnea on exertion, orthopnea, PND, pedal edema, palpitations, syncope or chest pain.   Current Outpatient Medications  Medication Sig Dispense Refill  . acetaminophen (TYLENOL) 500 MG tablet Take 1,000 mg by mouth every 6 (six) hours as needed for moderate pain or headache.    Marland Kitchen aspirin 81 MG tablet Take 81 mg by mouth daily.      Marland Kitchen atorvastatin (LIPITOR) 80 MG tablet TAKE 1 TABLET BY MOUTH DAILY AT 6 PM 90 tablet 2  . Cholecalciferol (VITAMIN D3) 125 MCG (5000 UT) CAPS Take 5,000 Units by mouth daily.    . clindamycin (CLEOCIN T) 1 % lotion Apply 1 application topically daily.    . Cranberry-Vitamin C-Vitamin E (CRANBERRY PLUS VITAMIN C) 4200-20-3 MG-MG-UNIT CAPS Take 2 capsules by mouth daily.    Marland Kitchen escitalopram (LEXAPRO) 10 MG tablet Take 10 mg by mouth daily.    Marland Kitchen ezetimibe (ZETIA) 10 MG tablet TAKE 1 TABLET BY MOUTH EVERY DAY 90 tablet 3  . Ferrous Sulfate (IRON) 325 (65 Fe) MG TABS Take 650 mg by mouth daily.     . INVOKANA 300 MG TABS tablet TAKE 1 TABLET BY MOUTH EVERY DAY BEFORE BREAKFAST 90 tablet 1  . Lancet Devices (ONE TOUCH DELICA LANCING DEV) MISC Use as directed to check blood sugars twice to four time per day. Dx E11.9 1 each 11  . Lancets (ONETOUCH DELICA PLUS [id number]) MISC Check blood sugar 2 to 3 times daily. 100 each 1  . levothyroxine (SYNTHROID) 50 MCG tablet TAKE 1 TABLET BY MOUTH EVERY DAY 90  tablet 3  . metFORMIN (GLUCOPHAGE-XR) 500 MG 24 hr tablet TAKE 2 TABLETS BY MOUTH 2 TIMES DAILY WITH A MEAL 360 tablet 1  . Multiple Vitamins-Minerals (MULTIVITAMIN PO) Take 1 tablet by mouth daily.     . Omega-3 Fatty Acids (FISH OIL) 1000 MG CAPS Take 2,000 mg by mouth daily.    Marland Kitchen omeprazole (PRILOSEC) 40 MG capsule Take 1 capsule (40 mg total) by mouth 2 (two) times daily. 180 capsule 2  . ONETOUCH ULTRA test strip USE TO CHECK BLOOD SUGAR 2 TO 3 TIMES DAILY 100 strip 1  . Semaglutide, 1 MG/DOSE, (OZEMPIC, 1 MG/DOSE,) 2 MG/1.5ML SOPN Inject 0.75 mLs (1 mg total) into the skin once a week. 12 mL 5  . vitamin B-12 (CYANOCOBALAMIN) 1000 MCG tablet Take 2,000 mcg by mouth daily.     No current facility-administered medications for this visit.     Past Medical History:  Diagnosis Date  . Anemia    as a child   . CAD, NATIVE VESSEL 05/13/2009  . Cataracts, bilateral    MD just watching  . Complication of anesthesia   . DIABETES MELLITUS, TYPE II 12/09/2009  . Gastric ulcer   . Gastritis   . GERD (gastroesophageal reflux disease)   . Hx of abnormal cervical Pap smear    cryo  but normal since then   . HYPERLIPIDEMIA-MIXED 04/14/2008  . Intestinal metaplasia of gastric mucosa 2012  . Myocardial  infarction ([place])    2003  . Obesity   . Occult blood in stools   . Osteopenia   . OVERWEIGHT/OBESITY 05/13/2009  . Ulcer    Duodenal ulcer by x-ray 40 years ago  . Varicose veins    under rx [facility] 2016    Past Surgical History:  Procedure Laterality Date  . BIOPSY  09/29/2018   Procedure: BIOPSY;  Surgeon: Rush Landmark Telford Nab., MD;  Location: [facility];  Service: Gastroenterology;;  . BIOPSY  01/14/2020   Procedure: BIOPSY;  Surgeon: Irving Copas., MD;  Location: [facility];  Service: Gastroenterology;;  . CESAREAN SECTION     x2  . CLOSED REDUCTION PROXIMAL TIBIOFIBULAR JOINT [place]  2004   left  . COLONOSCOPY    . COLONOSCOPY WITH PROPOFOL N/A 09/29/2018    Procedure: COLONOSCOPY WITH PROPOFOL;  Surgeon: Rush Landmark Telford Nab., MD;  Location: [facility];  Service: Gastroenterology;  Laterality: N/A;  . DILATION AND CURETTAGE OF UTERUS    . ECTOPIC PREGNANCY SURGERY    . ESOPHAGOGASTRODUODENOSCOPY N/A 02/12/2018   Procedure: ESOPHAGOGASTRODUODENOSCOPY (EGD);  Surgeon: Irving Copas., MD;  Location: [facility];  Service: Gastroenterology;  Laterality: N/A;  . ESOPHAGOGASTRODUODENOSCOPY N/A 09/29/2018   Procedure: ESOPHAGOGASTRODUODENOSCOPY (EGD);  Surgeon: Irving Copas., MD;  Location: [facility];  Service: Gastroenterology;  Laterality: N/A;  . ESOPHAGOGASTRODUODENOSCOPY (EGD) WITH PROPOFOL N/A 01/14/2020   Procedure: ESOPHAGOGASTRODUODENOSCOPY (EGD) WITH PROPOFOL;  Surgeon: Rush Landmark Telford Nab., MD;  Location: [facility];  Service: Gastroenterology;  Laterality: N/A;  . EUS N/A 02/12/2018   Procedure: UPPER ENDOSCOPIC ULTRASOUND (EUS) RADIAL;  Surgeon: Rush Landmark Telford Nab., MD;  Location: [facility];  Service: Gastroenterology;  Laterality: N/A;  . FIBULA FRACTURE SURGERY    . GANGLION CYST EXCISION    . POLYPECTOMY  02/12/2018   Procedure: POLYPECTOMY;  Surgeon: Mansouraty, Telford Nab., MD;  Location: [facility];  Service: Gastroenterology;;  . POLYPECTOMY  01/14/2020   Procedure: POLYPECTOMY;  Surgeon: Irving Copas., MD;  Location: [facility];  Service: Gastroenterology;;  . TONSILLECTOMY    . TUBAL LIGATION     One Tube  . UPPER GASTROINTESTINAL ENDOSCOPY     08/27/17    Social History   Socioeconomic History  . Marital status: Widowed    Spouse name: Not on file  . Number of children: 2  . Years of education: phd   . Highest education level: Not on file  Occupational History  . Occupation: Mental Health Counselor    Comment: [employer]  . Occupation: Nurse, adult: [employer]  Tobacco Use  . Smoking status: Former Smoker    Types: Cigarettes    Quit date:  02/05/1994    Years since quitting: 26.3  . Smokeless tobacco: Never Used  Vaping Use  . Vaping Use: Never used  Substance and Sexual Activity  . Alcohol use: No    Alcohol/week: 0.0 standard drinks  . Drug use: No  . Sexual activity: Not on file  Other Topics Concern  . 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    No firearms    NO reg exercise    Pets- Poodles 2  Sleep 7 hours    Social Determinants of Health   Financial Resource Strain: Low Risk   . Difficulty of Paying Living Expenses: Not hard at all  Food Insecurity: No Food Insecurity  . Worried About Charity fundraiser in the Last Year: Never true  . Ran Out of Food in the Last Year: Never true  Transportation Needs: No Transportation Needs  . Lack of Transportation (Medical): No  . Lack of Transportation (Non-Medical): No  Physical Activity: Inactive  . Days of Exercise per Week: 0 days  . Minutes of Exercise per Session: 0 min  Stress: No Stress Concern Present  . Feeling of Stress : Not at all  Social Connections: Moderately Isolated  . Frequency of Communication with Friends and Family: More than three times a week  . Frequency of Social Gatherings with Friends and Family: More than three times a week  . Attends Religious Services: Never  . Active Member of Clubs or Organizations: Yes  . Attends Archivist Meetings: More than 4 times per year  . Marital Status: Widowed  Intimate Partner Violence: Not At Risk  . Fear of Current or Ex-Partner: No  . Emotionally Abused: No  . Physically Abused: No  . Sexually Abused: No    Family History  Problem Relation Age of Onset  . Emphysema Mother        Smoker  . Heart attack Mother        due to head injury  . Alcohol abuse Mother   . Heart  disease Mother   . Early death Mother   . Varicose Veins Mother   . AAA (abdominal aortic aneurysm) Mother   . Lung cancer Father   . Heart attack Father   . Hypertension Father   . Alcohol abuse Father   . Heart disease Father        before age 57  . Aneurysm Father   . Hyperlipidemia Father   . Alcohol abuse Brother   . Diabetes Brother   . Heart disease Brother   . Heart attack Brother   . Diabetes Brother   . Aneurysm Paternal Grandmother   . Diabetes Other        grandfather  . Sudden Cardiac Death Brother   . Colon cancer Neg Hx   . Esophageal cancer Neg Hx   . Stomach cancer Neg Hx   . Inflammatory bowel disease Neg Hx   . Liver disease Neg Hx   . Pancreatic cancer Neg Hx   . Rectal cancer Neg Hx     ROS: no fevers or chills, productive cough, hemoptysis, dysphasia, odynophagia, melena, hematochezia, dysuria, hematuria, rash, seizure activity, orthopnea, PND, pedal edema, claudication. Remaining systems are negative.  Physical Exam: Well-developed well-nourished in no acute distress.  Skin is warm and dry.  HEENT is normal.  Neck is supple.  Chest is clear to auscultation with normal expansion.  Cardiovascular exam is regular rate and rhythm.  Abdominal exam nontender or distended. No masses palpated. Extremities show no edema. neuro grossly intact  ECG-sinus bradycardia at a rate of 59, normal axis, first-degree AV block.  Personally reviewed  A/P  1 coronary artery disease-patient doing well with no chest pain.  Continue aspirin and statin.  2 carotid artery disease-mild on most recent carotid Dopplers.  3 hyperlipidemia-continue Lipitor and Zetia.  Kirk Ruths, MD

## 2020-05-31 ENCOUNTER — Ambulatory Visit: Payer: PPO | Admitting: Gastroenterology

## 2020-05-31 ENCOUNTER — Encounter: Payer: Self-pay | Admitting: Gastroenterology

## 2020-05-31 VITALS — BP 110/70 | HR 61 | Ht 65.0 in | Wt 149.0 lb

## 2020-05-31 DIAGNOSIS — K31A0 Gastric intestinal metaplasia, unspecified: Secondary | ICD-10-CM | POA: Diagnosis not present

## 2020-05-31 NOTE — Patient Instructions (Signed)
Do Lactulose free diet trial 1-2 weeks   Avoid artificial sweeteners  Take Benefiber 1 tablespoon twice a day  Increase water intake 8-10 cups daily   Lactose-Free Diet, Adult If you have lactose intolerance, you are not able to digest lactose. Lactose is a natural sugar found mainly in dairy milk and dairy products. You may need to avoid all foods and beverages that contain lactose. A lactose-free diet can help you do this. Which foods have lactose? Lactose is found in dairy milk and dairy products, such as:  Yogurt.  Cheese.  Butter.  Margarine.  Sour cream.  Cream.  Whipped toppings and nondairy creamers.  Ice cream and other dairy-based desserts. Lactose is also found in foods or products made with dairy milk or milk ingredients. To find out whether a food contains dairy milk or a milk ingredient, look at the ingredients list. Avoid foods with the statement "May contain milk" and foods that contain:  Milk powder.  Whey.  Curd.  Caseinate.  Lactose.  Lactalbumin.  Lactoglobulin. What are alternatives to dairy milk and foods made with milk products?  Lactose-free milk.  Soy milk with added calcium and vitamin D.  Almond milk, coconut milk, rice milk, or other nondairy milk alternatives with added calcium and vitamin D. Note that these are low in protein.  Soy products, such as soy yogurt, soy cheese, soy ice cream, and soy-based sour cream.  Other nut milk products, such as almond yogurt, almond cheese, cashew yogurt, cashew cheese, cashew ice cream, coconut yogurt, and coconut ice cream. What are tips for following this plan?  Do not consume foods, beverages, vitamins, minerals, or medicines containing lactose. Read ingredient lists carefully.  Look for the words "lactose-free" on labels.  Use lactase enzyme drops or tablets as directed by your health care provider.  Use lactose-free milk or a milk alternative, such as soy milk or almond milk, for  drinking and cooking.  Make sure you get enough calcium and vitamin D in your diet. A lactose-free eating plan can be lacking in these important nutrients.  Take calcium and vitamin D supplements as directed by your health care provider. Talk to your health care provider about supplements if you are not able to get enough calcium and vitamin D from food. What foods can I eat? Fruits All fresh, canned, frozen, or dried fruits that are not processed with lactose. Vegetables All fresh, frozen, and canned vegetables without cheese, cream, or butter sauces. Grains Any that are not made with dairy milk or dairy products. Meats and other proteins Any meat, fish, poultry, and other protein sources that are not made with dairy milk or dairy products. Soy cheese and yogurt. Fats and oils Any that are not made with dairy milk or dairy products. Beverages Lactose-free milk. Soy, rice, or almond milk with added calcium and vitamin D. Fruit and vegetable juices. Sweets and desserts Any that are not made with dairy milk or dairy products. Seasonings and condiments Any that are not made with dairy milk or dairy products. Calcium Calcium is found in many foods that contain lactose and is important for bone health. The amount of calcium you need depends on your age:  Adults younger than 50 years: 1,000 mg of calcium a day.  Adults older than 50 years: 1,200 mg of calcium a day. If you are not getting enough calcium, you may get it from other sources, including:  Orange juice with calcium added. There are 300-350 mg of calcium in  1 cup of orange juice.  Calcium-fortified soy milk. There are 300-400 mg of calcium in 1 cup of calcium-fortified soy milk.  Calcium-fortified rice or almond milk. There are 300 mg of calcium in 1 cup of calcium-fortified rice or almond milk.  Calcium-fortified breakfast cereals. There are 100-1,000 mg of calcium in calcium-fortified breakfast cereals.  Spinach, cooked.  There are 145 mg of calcium in  cup of cooked spinach.  Edamame, cooked. There are 130 mg of calcium in  cup of cooked edamame.  Collard greens, cooked. There are 125 mg of calcium in  cup of cooked collard greens.  Kale, frozen or cooked. There are 90 mg of calcium in  cup of cooked or frozen kale.  Almonds. There are 95 mg of calcium in  cup of almonds.  Broccoli, cooked. There are 60 mg of calcium in 1 cup of cooked broccoli. The items listed above may not be a complete list of recommended foods and beverages. Contact a dietitian for more options.   What foods are not recommended? Fruits None, unless they are made with dairy milk or dairy products. Vegetables None, unless they are made with dairy milk or dairy products. Grains Any grains that are made with dairy milk or dairy products. Meats and other proteins None, unless they are made with dairy milk or dairy products. Dairy All dairy products, including milk, goat's milk, buttermilk, kefir, acidophilus milk, flavored milk, evaporated milk, condensed milk, dulce de [place], eggnog, yogurt, cheese, and cheese spreads. Fats and oils Any that are made with milk or milk products. Margarines and salad dressings that contain milk or cheese. Cream. Half and half. Cream cheese. Sour cream. Chip dips made with sour cream or yogurt. Beverages Hot chocolate. Cocoa with lactose. Instant iced teas. Powdered fruit drinks. Smoothies made with dairy milk or yogurt. Sweets and desserts Any that are made with milk or milk products. Seasonings and condiments Chewing gum that has lactose. Spice blends if they contain lactose. Artificial sweeteners that contain lactose. Nondairy creamers. The items listed above may not be a complete list of foods and beverages to avoid. Contact a dietitian for more information. Summary  If you are lactose intolerant, it means that you have a hard time digesting lactose, a natural sugar found in milk and milk  products.  Following a lactose-free diet can help you manage this condition.  Calcium is important for bone health and is found in many foods that contain lactose. Talk with your health care provider about other sources of calcium. This information is not intended to replace advice given to you by your health care provider. Make sure you discuss any questions you have with your health care provider. Document Revised: 02/19/2017 Document Reviewed: 02/19/2017 Elsevier Patient Education  2021 [employer].  Due to recent changes in healthcare laws, you may see the results of your imaging and laboratory studies on MyChart before your provider has had a chance to review them.  We understand that in some cases there may be results that are confusing or concerning to you. Not all laboratory results come back in the same time frame and the provider may be waiting for multiple results in order to interpret others.  Please give [place] 48 hours in order for your provider to thoroughly review all the results before contacting the office for clarification of your results.   I appreciate the  opportunity to care for you  Thank You   Harl Bowie , MD

## 2020-05-31 NOTE — Progress Notes (Signed)
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  Subsequent surveillance EGDs negative for recurrence. She had large adenomatous polyps removed from colon in 2019, surveillance colonoscopy in 2020 was negative.  She was recommended 3-year recall  Overall she is doing well.  She is having irregular bowel habits with constipation and diarrhea.  She has changed her diet.  She is avoiding caffeinated drinks.  She is using artificial sweeteners, has fecal urgency and slightly increased frequency of bowel movements on some days. Denies any rectal bleeding, melena, abdominal pain, nausea or vomiting.  EGD 01/14/20 - No gross lesions in esophagus proximally. Salmon-colored mucosa tongue noted -biopsied to rule out Barrett's. - Z-line irregular, 37 cm from the incisors. - 3 cm hiatal hernia. - One gastric polyp - likely hyperplastic or fundic glan. Resected and retrieved. - Scar in the prepyloric to pylorus region. Some protuberance/nodularity within the pyloric channel noted. Region biopsied via cold snare to ensure no evidence of recurrent adenoma. - No other gross lesions in the stomach. - No gross lesions in the duodenal bulb, in the first portion of the duodenum and in the second portion of the duodenum.  A. PRE-PYLORIC EMR SCAR, BIOPSY:  - Gastric antral type mucosa with mild chronic gastritis, reactive  gastropathy and focal intestinal metaplasia.  - Negative for dysplasia.   B. ESOPHAGUS, DISTAL, BIOPSY:  -  Squamocolumnar esophageal mucosa with reactive/regenerative changes.  - Negative for intestinal metaplasia (goblet cell metaplasia).   C. STOMACH, POLYPECTOMY:  - Fundic gland polyp.  - Negative for dysplasia.   EGD 09/29/18 - No gross lesions in proximal/middle esophagus. Salmon-colored mucosa suggestive of short-segment Barrett's esophagus. Biopsied. - Small hiatal hernia. - No gross lesions in the stomach. Gastric mapping biopsies performed. - Scar in the prepyloric region of the stomach from previous mucosectomy without gross evidence of residual or recurrent polyp. Biopsied. - No gross lesions in the duodenal bulb, in the first portion of the duodenum and in the second portion of the duodenum.  Colonoscopy 09/29/18 - The examined portion of the ileum was normal. - Normal mucosa in the entire examined colon. - No evidence of recurrent polypoid tissue present on today's examination. - Non-bleeding non-thrombosed internal hemorrhoids.  Colonoscopy August 26, 2017 - One 6 mm polyp in the ascending colon, removed with a cold snare. Resected and retrieved. - One 18 mm polyp in the ascending colon, removed piecemeal using a cold snare. Resected and retrieved. - Moderate diverticulosis in the sigmoid colon. Peri-diverticular erythema was seen. Biopsied to exclude segmental colitis associated with diverticulosis. - Non-bleeding internal hemorrhoids.   Outpatient Encounter Medications as of 05/31/2020  Medication Sig  . acetaminophen (TYLENOL) 500 MG tablet Take 1,000 mg by mouth every 6 (six) hours as needed for moderate pain or headache.  Marland Kitchen aspirin 81 MG tablet Take 81 mg by mouth daily.    Marland Kitchen atorvastatin (LIPITOR) 80 MG tablet TAKE 1 TABLET BY MOUTH DAILY AT 6 PM (Patient taking differently: Take 80 mg by mouth daily at 6 PM.)  . Cholecalciferol (  VITAMIN D3) 125 MCG (5000 UT) CAPS Take 5,000 Units by mouth daily.  . clindamycin (CLEOCIN T) 1 % lotion Apply 1 application topically  daily.  . Cranberry-Vitamin C-Vitamin E (CRANBERRY PLUS VITAMIN C) 4200-20-3 MG-MG-UNIT CAPS Take 2 capsules by mouth daily.  Marland Kitchen escitalopram (LEXAPRO) 10 MG tablet Take 10 mg by mouth daily.  Marland Kitchen ezetimibe (ZETIA) 10 MG tablet TAKE 1 TABLET BY MOUTH EVERY DAY (Patient taking differently: Take 10 mg by mouth daily.)  . Ferrous Sulfate (IRON) 325 (65 Fe) MG TABS Take 650 mg by mouth daily.   . INVOKANA 300 MG TABS tablet TAKE 1 TABLET BY MOUTH EVERY DAY BEFORE BREAKFAST  . Lancet Devices (ONE TOUCH DELICA LANCING DEV) MISC Use as directed to check blood sugars twice to four time per day. Dx E11.9  . Lancets (ONETOUCH DELICA PLUS ENIDPO24M) MISC Check blood sugar 2 to 3 times daily.  Marland Kitchen levothyroxine (SYNTHROID) 50 MCG tablet TAKE 1 TABLET BY MOUTH EVERY DAY (Patient taking differently: Take 50 mcg by mouth daily.)  . metFORMIN (GLUCOPHAGE-XR) 500 MG 24 hr tablet TAKE 2 TABLETS BY MOUTH 2 TIMES DAILY WITH A MEAL  . Multiple Vitamins-Minerals (MULTIVITAMIN PO) Take 1 tablet by mouth daily.   . Omega-3 Fatty Acids (FISH OIL) 1000 MG CAPS Take 2,000 mg by mouth daily.  Marland Kitchen omeprazole (PRILOSEC) 40 MG capsule Take 1 capsule (40 mg total) by mouth 2 (two) times daily.  Glory Rosebush ULTRA test strip USE TO CHECK BLOOD SUGAR 2 TO 3 TIMES DAILY  . Semaglutide, 1 MG/DOSE, (OZEMPIC, 1 MG/DOSE,) 2 MG/1.5ML SOPN Inject 0.75 mLs (1 mg total) into the skin once a week. (Patient taking differently: Inject 1 mg into the skin every Sunday.)  . vitamin B-12 (CYANOCOBALAMIN) 1000 MCG tablet Take 2,000 mcg by mouth daily.   No facility-administered encounter medications on file as of 05/31/2020.    Allergies as of 05/31/2020 - Review Complete 05/31/2020  Allergen Reaction Noted  . Penicillins Rash 02/24/2010    Past Medical History:  Diagnosis Date  . Anemia    as a child   . CAD, NATIVE VESSEL 05/13/2009  . Cataracts, bilateral    MD just watching  . Complication of anesthesia   . DIABETES MELLITUS, TYPE II  12/09/2009  . Gastric ulcer   . Gastritis   . GERD (gastroesophageal reflux disease)   . Hx of abnormal cervical Pap smear    cryo  but normal since then   . HYPERLIPIDEMIA-MIXED 04/14/2008  . Intestinal metaplasia of gastric mucosa 2012  . Myocardial infarction ([place])    2003  . Obesity   . Occult blood in stools   . Osteopenia   . OVERWEIGHT/OBESITY 05/13/2009  . Ulcer    Duodenal ulcer by x-ray 40 years ago  . Varicose veins    under rx [place] 2016    Past Surgical History:  Procedure Laterality Date  . BIOPSY  09/29/2018   Procedure: BIOPSY;  Surgeon: Rush Landmark Telford Nab., MD;  Location: [facility];  Service: Gastroenterology;;  . BIOPSY  01/14/2020   Procedure: BIOPSY;  Surgeon: Irving Copas., MD;  Location: [facility];  Service: Gastroenterology;;  . CESAREAN SECTION     x2  . CLOSED REDUCTION PROXIMAL TIBIOFIBULAR JOINT [place]  2004   left  . COLONOSCOPY    . COLONOSCOPY WITH PROPOFOL N/A 09/29/2018   Procedure: COLONOSCOPY WITH PROPOFOL;  Surgeon: Rush Landmark Telford Nab., MD;  Location: [facility];  Service: Gastroenterology;  Laterality: N/A;  .  DILATION AND CURETTAGE OF UTERUS    . ECTOPIC PREGNANCY SURGERY    . ESOPHAGOGASTRODUODENOSCOPY N/A 02/12/2018   Procedure: ESOPHAGOGASTRODUODENOSCOPY (EGD);  Surgeon: Lemar Lofty., MD;  Location: [facility];  Service: Gastroenterology;  Laterality: N/A;  . ESOPHAGOGASTRODUODENOSCOPY N/A 09/29/2018   Procedure: ESOPHAGOGASTRODUODENOSCOPY (EGD);  Surgeon: Lemar Lofty., MD;  [facility];  Service: Gastroenterology;  Laterality: N/A;  . ESOPHAGOGASTRODUODENOSCOPY (EGD) WITH PROPOFOL N/A 01/14/2020   Procedure: ESOPHAGOGASTRODUODENOSCOPY (EGD) WITH PROPOFOL;  Surgeon: Meridee Score Netty Starring., MD;  Location: [facility] ENDOSCOPY;  Service: Gastroenterology;  Laterality: N/A;  . EUS N/A 02/12/2018   Procedure: UPPER ENDOSCOPIC ULTRASOUND (EUS) RADIAL;  Surgeon: Meridee Score Netty Starring., MD;   Location: [facility] ENDOSCOPY;  Service: Gastroenterology;  Laterality: N/A;  . FIBULA FRACTURE SURGERY    . GANGLION CYST EXCISION    . POLYPECTOMY  02/12/2018   Procedure: POLYPECTOMY;  Surgeon: Mansouraty, Netty Starring., MD;  Location: [facility];  Service: Gastroenterology;;  . POLYPECTOMY  01/14/2020   Procedure: POLYPECTOMY;  Surgeon: Lemar Lofty., MD;  Location: [facility] ENDOSCOPY;  Service: Gastroenterology;;  . TONSILLECTOMY    . TUBAL LIGATION     One Tube  . UPPER GASTROINTESTINAL ENDOSCOPY     08/27/17    Family History  Problem Relation Age of Onset  . Emphysema Mother        Smoker  . Heart attack Mother        due to head injury  . Alcohol abuse Mother   . Heart disease Mother   . Early death Mother   . Varicose Veins Mother   . AAA (abdominal aortic aneurysm) Mother   . Lung cancer Father   . Heart attack Father   . Hypertension Father   . Alcohol abuse Father   . Heart disease Father        before age 24  . Aneurysm Father   . Hyperlipidemia Father   . Alcohol abuse Brother   . Diabetes Brother   . Heart disease Brother   . Heart attack Brother   . Diabetes Brother   . Aneurysm Paternal Grandmother   . Diabetes Other        grandfather  . Sudden Cardiac Death Brother   . Colon cancer Neg Hx   . Esophageal cancer Neg Hx   . Stomach cancer Neg Hx   . Inflammatory bowel disease Neg Hx   . Liver disease Neg Hx   . Pancreatic cancer Neg Hx   . Rectal cancer Neg Hx     Social History   Socioeconomic History  . Marital status: Widowed    Spouse name: Not on file  . Number of children: 2  . Years of education: phd   . Highest education level: Not on file  Occupational History  . Occupation: Mental Health Counselor    Comment: [employer]  . Occupation: Glass blower/designer: [employer]  Tobacco Use  . Smoking status: Former Smoker    Types: Cigarettes    Quit date: 02/05/1994    Years since quitting: 26.3  . Smokeless tobacco:  Never Used  Vaping Use  . Vaping Use: Never used  Substance and Sexual Activity  . Alcohol use: No    Alcohol/week: 0.0 standard drinks  . Drug use: No  . Sexual activity: Not on file  Other Topics Concern  . 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    No firearms    NO reg exercise    Pets- Poodles 2    Sleep 7 hours    Social Determinants of Health   Financial Resource Strain: Low Risk   . Difficulty of Paying Living Expenses: Not hard at all  Food Insecurity: No Food Insecurity  . Worried About Charity fundraiser in the Last Year: Never true  . Ran Out of Food in the Last Year: Never true  Transportation Needs: No Transportation Needs  . Lack of Transportation (Medical): No  . Lack of Transportation (Non-Medical): No  Physical Activity: Inactive  . Days of Exercise per Week: 0 days  . Minutes of Exercise per Session: 0 min  Stress: No Stress Concern Present  . Feeling of Stress : Not at all  Social Connections: Moderately Isolated  . Frequency of Communication with Friends and Family: More than three times a week  . Frequency of Social Gatherings with Friends and Family: More than three times a week  . Attends Religious Services: Never  . Active Member of Clubs or Organizations: Yes  . Attends Archivist Meetings: More than 4 times per year  . Marital Status: Widowed  Intimate Partner Violence: Not At Risk  . Fear of Current or Ex-Partner: No  . Emotionally Abused: No  . Physically Abused: No  . Sexually Abused: No      Review of systems: All other review of systems negative except as mentioned in the HPI.   Physical Exam: Vitals:   05/31/20 0847  BP: 110/70  Pulse: 61   Body mass index is 24.79 kg/m. 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  Recommended repeat EGD 2 to 3 years, will plan for surveillance EGD in August 2023 along with colonoscopy Continue omeprazole 40 mg twice daily Antireflux measures  History of advanced adenomatous colon polyps: Due for surveillance colonoscopy in August 2023  Change in bowel habits with increased frequency and urgency: Trial of lactose-free diet Avoid artificial sweeteners Increase dietary fiber and water intake Add Benefiber 1 tablespoon 2-3 times daily with meals  Iron deficiency anemia: Improved  Return as needed   The patient was provided an opportunity to ask questions and all were answered. The patient agreed with the plan and demonstrated an understanding of the instructions.  Damaris Hippo , MD    CC: Panosh, Standley Brooking, MD

## 2020-06-02 ENCOUNTER — Encounter: Payer: Self-pay | Admitting: Gastroenterology

## 2020-06-03 ENCOUNTER — Encounter: Payer: Self-pay | Admitting: Cardiology

## 2020-06-03 ENCOUNTER — Ambulatory Visit (INDEPENDENT_AMBULATORY_CARE_PROVIDER_SITE_OTHER): Payer: PPO | Admitting: Cardiology

## 2020-06-03 ENCOUNTER — Other Ambulatory Visit: Payer: Self-pay

## 2020-06-03 VITALS — BP 102/60 | HR 59 | Ht 65.0 in | Wt 148.8 lb

## 2020-06-03 DIAGNOSIS — E7800 Pure hypercholesterolemia, unspecified: Secondary | ICD-10-CM | POA: Diagnosis not present

## 2020-06-03 DIAGNOSIS — I2510 Atherosclerotic heart disease of native coronary artery without angina pectoris: Secondary | ICD-10-CM

## 2020-06-03 NOTE — Patient Instructions (Signed)

## 2020-06-15 ENCOUNTER — Other Ambulatory Visit: Payer: Self-pay | Admitting: Internal Medicine

## 2020-06-15 ENCOUNTER — Other Ambulatory Visit: Payer: Self-pay | Admitting: Cardiology

## 2020-07-06 ENCOUNTER — Telehealth: Payer: Self-pay | Admitting: Internal Medicine

## 2020-07-06 NOTE — Chronic Care Management (AMB) (Signed)
  Chronic Care Management   Outreach Note  07/06/2020 Name: Morgan [name] MRN: [id number] DOB: Apr 24, 1950  Referred by: Burnis Medin, MD Reason for referral : No chief complaint on file.   An unsuccessful telephone outreach was attempted today. The patient was referred to the pharmacist for assistance with care management and care coordination.   Follow Up Plan:   Tatjana Dellinger Upstream Scheduler

## 2020-07-20 ENCOUNTER — Telehealth: Payer: Self-pay | Admitting: Internal Medicine

## 2020-07-20 NOTE — Chronic Care Management (AMB) (Signed)
  Chronic Care Management   Outreach Note  07/20/2020 Name: Morgan [name] MRN: [id number] DOB: July 16, 1950  Referred by: Burnis Medin, MD Reason for referral : No chief complaint on file.   A second unsuccessful telephone outreach was attempted today. The patient was referred to pharmacist for assistance with care management and care coordination.  Follow Up Plan:   Tatjana Dellinger Upstream Scheduler

## 2020-07-22 ENCOUNTER — Other Ambulatory Visit: Payer: Self-pay

## 2020-07-22 ENCOUNTER — Other Ambulatory Visit (INDEPENDENT_AMBULATORY_CARE_PROVIDER_SITE_OTHER): Payer: PPO

## 2020-07-22 DIAGNOSIS — E1165 Type 2 diabetes mellitus with hyperglycemia: Secondary | ICD-10-CM | POA: Diagnosis not present

## 2020-07-22 DIAGNOSIS — Z79899 Other long term (current) drug therapy: Secondary | ICD-10-CM | POA: Diagnosis not present

## 2020-07-22 DIAGNOSIS — E038 Other specified hypothyroidism: Secondary | ICD-10-CM | POA: Diagnosis not present

## 2020-07-22 DIAGNOSIS — Z87440 Personal history of urinary (tract) infections: Secondary | ICD-10-CM

## 2020-07-22 DIAGNOSIS — E785 Hyperlipidemia, unspecified: Secondary | ICD-10-CM | POA: Diagnosis not present

## 2020-07-22 LAB — BASIC METABOLIC PANEL
BUN: 7 mg/dL (ref 6–23)
CO2: 30 mEq/L (ref 19–32)
Calcium: 9.6 mg/dL (ref 8.4–10.5)
Chloride: 99 mEq/L (ref 96–112)
Creatinine, Ser: 0.68 mg/dL (ref 0.40–1.20)
GFR: 88.94 mL/min (ref >60.00)
Glucose, Bld: 140 mg/dL — ABNORMAL HIGH (ref 70–99)
Potassium: 4.2 mEq/L (ref 3.5–5.1)
Sodium: 138 mEq/L (ref 135–145)

## 2020-07-22 LAB — HEPATIC FUNCTION PANEL
ALT: 19 U/L (ref 0–35)
AST: 16 U/L (ref 0–37)
Albumin: 4.2 g/dL (ref 3.5–5.2)
Alkaline Phosphatase: 54 U/L (ref 39–117)
Bilirubin, Direct: 0.2 mg/dL (ref 0.0–0.3)
Total Bilirubin: 0.7 mg/dL (ref 0.2–1.2)
Total Protein: 6.4 g/dL (ref 6.0–8.3)

## 2020-07-22 LAB — TSH: TSH: 2.64 u[IU]/mL (ref 0.35–4.50)

## 2020-07-22 LAB — CBC WITH DIFFERENTIAL/PLATELET
Basophils Absolute: 0 10*3/uL (ref 0.0–0.1)
Basophils Relative: 0.3 % (ref 0.0–3.0)
Eosinophils Absolute: 0 10*3/uL (ref 0.0–0.7)
Eosinophils Relative: 0.4 % (ref 0.0–5.0)
HCT: 41.5 % (ref 36.0–46.0)
Hemoglobin: 14.3 g/dL (ref 12.0–15.0)
Lymphocytes Relative: 32.1 % (ref 12.0–46.0)
Lymphs Abs: 1.2 10*3/uL (ref 0.7–4.0)
MCHC: 34.5 g/dL (ref 30.0–36.0)
MCV: 92.1 fl (ref 78.0–100.0)
Monocytes Absolute: 0.3 10*3/uL (ref 0.1–1.0)
Monocytes Relative: 6.9 % (ref 3.0–12.0)
Neutro Abs: 2.2 10*3/uL (ref 1.4–7.7)
Neutrophils Relative %: 60.3 % (ref 43.0–77.0)
Platelets: 189 10*3/uL (ref 150.0–400.0)
RBC: 4.51 Mil/uL (ref 3.87–5.11)
RDW: 13.2 % (ref 11.5–15.5)
WBC: 3.7 10*3/uL — ABNORMAL LOW (ref 4.0–10.5)

## 2020-07-22 LAB — HEMOGLOBIN A1C: Hgb A1c MFr Bld: 7.3 % — ABNORMAL HIGH (ref 4.6–6.5)

## 2020-07-22 LAB — LIPID PANEL
Cholesterol: 109 mg/dL (ref 0–200)
HDL: 34.3 mg/dL — ABNORMAL LOW (ref >39.00)
LDL Cholesterol: 61 mg/dL (ref 0–99)
NonHDL: 74.92
Total CHOL/HDL Ratio: 3
Triglycerides: 69 mg/dL (ref 0.0–149.0)
VLDL: 13.8 mg/dL (ref 0.0–40.0)

## 2020-07-22 LAB — MICROALBUMIN / CREATININE URINE RATIO
Creatinine,U: 58.8 mg/dL
Microalb Creat Ratio: 1.2 mg/g (ref 0.0–30.0)
Microalb, Ur: <0.7 mg/dL (ref 0.0–1.9)

## 2020-07-22 NOTE — Progress Notes (Signed)
A1c up slightly  results otherwise stable of normal...will review at upcoming  visit

## 2020-07-29 ENCOUNTER — Other Ambulatory Visit: Payer: Self-pay

## 2020-07-31 NOTE — Progress Notes (Signed)
Chief Complaint  Patient presents with   Annual Exam     HPI: Morgan [name] 70 y.o. comes in today for Preventive Medicare exam/ wellness visit .Since last visit. And Chronic disease management  She is seen [place] psychiatrist been placed on Lexapro 10 mg since the fall 2021 and feels like she sleeps so much better but is some sleepiness in the day no depression minimal anxiety. DM 124  109 is been doing well on Ozempic.  Has continued to lose some weight but eats BP  ok  HLD has seen cardiology on aspirin Thyroid no change No excess bleeding but forearms and hands do have subcutaneous ecchymosis with minor hits. Has seen cardiology  for hld cad ,GI  poss lactose intolerance ,ophthalmology will be getting cataract surgery in the near future.  To see GYN   Health Maintenance  Topic Date Due   Zoster Vaccines- Shingrix (1 of 2) Never done   COLONOSCOPY (Pts 45-67yrs Insurance coverage will need to be confirmed)  09/29/2019   COVID-19 Vaccine (5 - Booster for Pfizer series) 03/02/2020   MAMMOGRAM  03/12/2020   OPHTHALMOLOGY EXAM  05/21/2020   INFLUENZA VACCINE  09/05/2020   HEMOGLOBIN A1C  01/21/2021   URINE MICROALBUMIN  07/22/2021   FOOT EXAM  08/01/2021   TETANUS/TDAP  09/23/2024   DEXA SCAN  Completed   Hepatitis C Screening  Completed   PNA vac Low Risk Adult  Completed   HPV VACCINES  Aged Out   Health Maintenance Review LIFESTYLE:  Exercise: Not sedentary Tobacco/ETS:n Alcohol: n Sugar beverages:n Sleep: Good up to 9 hours Drug use: no HH:1 Work 40 hours a week still enjoys     Hearing: Okay except if there is background noise some difficulty but not affecting socialization Vision:   Last eye check UTD has cataract Safety:  Has smoke detector and wears seat belts.  No firearms. No excess sun exposure. Sees dentist regularly. Falls: n Memory: Felt to be good  , no concern from her or her family. Depression: No anhedonia unusual  crying or depressive symptoms on medicine doing well Nutrition: Eats well balanced diet; adequate calcium and vitamin D. No swallowing chewing problems. Injury: no major injuries in the last six months. Other healthcare providers:  Reviewed today . Preventive parameters: up-to-date  Reviewed  ADLS:   There are no problems or need for assistance  driving, feeding, obtaining food, dressing, toileting and bathing, managing money using phone. She is independent.    ROS:  REST of 12 system review negative except as per HPI   Past Medical History:  Diagnosis Date   Anemia    as a child    CAD, NATIVE VESSEL 05/13/2009   Cataracts, bilateral    MD just watching   Complication of anesthesia    DIABETES MELLITUS, TYPE II 12/09/2009   Gastric ulcer    Gastritis    GERD (gastroesophageal reflux disease)    Hx of abnormal cervical Pap smear    cryo  but normal since then    HYPERLIPIDEMIA-MIXED 04/14/2008   Intestinal metaplasia of gastric mucosa 2012   Myocardial infarction ([place])    2003   Obesity    Occult blood in stools    Osteopenia    OVERWEIGHT/OBESITY 05/13/2009   Ulcer    Duodenal ulcer by x-ray 40 years ago   Varicose veins    under rx [facility] 2016    Family History  Problem Relation Age of Onset  Emphysema Mother        Smoker   Heart attack Mother        due to head injury   Alcohol abuse Mother    Heart disease Mother    Early death Mother    Varicose Veins Mother    AAA (abdominal aortic aneurysm) Mother    Lung cancer Father    Heart attack Father    Hypertension Father    Alcohol abuse Father    Heart disease Father        before age 59   Aneurysm Father    Hyperlipidemia Father    Alcohol abuse Brother    Diabetes Brother    Heart disease Brother    Heart attack Brother    Diabetes Brother    Aneurysm Paternal Grandmother    Diabetes Other        grandfather   Sudden Cardiac Death Brother    Colon cancer Neg Hx    Esophageal cancer Neg Hx     Stomach cancer Neg Hx    Inflammatory bowel disease Neg Hx    Liver disease Neg Hx    Pancreatic cancer Neg Hx    Rectal cancer Neg Hx     Social History   Socioeconomic History   Marital status: Widowed    Spouse name: Not on file   Number of children: 2   Years of education: phd    Highest education level: Not on file  Occupational History   Occupation: Mental Health Counselor    Comment: [employer]   Occupation: Nurse, adult: [employer]  Tobacco Use   Smoking status: Former    Pack years: 0.00    Types: Cigarettes    Quit date: 02/05/1994    Years since quitting: 26.5   Smokeless tobacco: Never  Vaping Use   Vaping Use: Never used  Substance and Sexual Activity   Alcohol use: No    Alcohol/week: 0.0 standard drinks   Drug use: No   Sexual activity: Not on file  Other Topics Concern   Not on file  Social History Narrative   HH of 2 currently    No tobacco    Separated     Recently widowed   5 14       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    No firearms    NO reg exercise    Pets- Poodles 2    Sleep 7 hours    Social Determinants of Health   Financial Resource Strain: Low Risk    Difficulty of Paying Living Expenses: Not hard at all  Food Insecurity: No Food Insecurity   Worried About Charity fundraiser in the Last Year: Never true   [place] in the Last Year: Never true  Transportation Needs: No Transportation Needs   Lack of Transportation (Medical): No   Lack of Transportation (Non-Medical): No  Physical Activity: Inactive   Days of Exercise per Week: 0 days   Minutes of Exercise per Session: 0 min  Stress: No Stress Concern Present   Feeling of Stress : Not at all  Social Connections: Moderately Isolated   Frequency of Communication with Friends and Family: More than three times a week   Frequency of Social Gatherings with  Friends and Family: More than three times a week  Attends Religious Services: Never   Active Member of Clubs or Organizations: Yes   Attends Archivist Meetings: More than 4 times per year   Marital Status: Widowed    Outpatient Encounter Medications as of 08/01/2020  Medication Sig   acetaminophen (TYLENOL) 500 MG tablet Take 1,000 mg by mouth every 6 (six) hours as needed for moderate pain or headache.   aspirin 81 MG tablet Take 81 mg by mouth daily.     atorvastatin (LIPITOR) 80 MG tablet TAKE 1 TABLET BY MOUTH DAILY AT 6 PM   Cholecalciferol (VITAMIN D3) 125 MCG (5000 UT) CAPS Take 5,000 Units by mouth daily.   clindamycin (CLEOCIN T) 1 % lotion Apply 1 application topically daily.   Cranberry-Vitamin C-Vitamin E (CRANBERRY PLUS VITAMIN C) 4200-20-3 MG-MG-UNIT CAPS Take 2 capsules by mouth daily.   escitalopram (LEXAPRO) 10 MG tablet Take 10 mg by mouth daily.   ezetimibe (ZETIA) 10 MG tablet TAKE 1 TABLET BY MOUTH EVERY DAY   Ferrous Sulfate (IRON) 325 (65 Fe) MG TABS Take 650 mg by mouth daily.    INVOKANA 300 MG TABS tablet TAKE 1 TABLET BY MOUTH EVERY DAY BEFORE BREAKFAST   Lancet Devices (ONE TOUCH DELICA LANCING DEV) MISC Use as directed to check blood sugars twice to four time per day. Dx E11.9   Lancets (ONETOUCH DELICA PLUS KDTOIZ12W) MISC Check blood sugar 2 to 3 times daily.   levothyroxine (SYNTHROID) 50 MCG tablet TAKE 1 TABLET BY MOUTH EVERY DAY   metFORMIN (GLUCOPHAGE-XR) 500 MG 24 hr tablet TAKE 2 TABLETS BY MOUTH 2 TIMES DAILY WITH A MEAL   Multiple Vitamins-Minerals (MULTIVITAMIN PO) Take 1 tablet by mouth daily.    Omega-3 Fatty Acids (FISH OIL) 1000 MG CAPS Take 2,000 mg by mouth daily.   omeprazole (PRILOSEC) 40 MG capsule Take 1 capsule (40 mg total) by mouth 2 (two) times daily.   ONETOUCH ULTRA test strip USE TO CHECK BLOOD SUGAR 2 TO 3 TIMES DAILY   Semaglutide, 1 MG/DOSE, (OZEMPIC, 1 MG/DOSE,) 2 MG/1.5ML SOPN Inject 0.75 mLs (1 mg total) into the  skin once a week.   vitamin B-12 (CYANOCOBALAMIN) 1000 MCG tablet Take 2,000 mcg by mouth daily.   No facility-administered encounter medications on file as of 08/01/2020.    EXAM:  BP 100/60 (BP Location: Left Arm, Patient Position: Sitting, Cuff Size: Normal)   Pulse (!) 56   Temp 97.7 F (36.5 C) (Oral)   Ht 5\' 5"  (1.651 m)   Wt 146 lb 6.4 oz (66.4 kg)   LMP  (LMP Unknown)   SpO2 97%   BMI 24.36 kg/m   Body mass index is 24.36 kg/m.  Physical Exam: Vital signs reviewed PYK:DXIP is a well-developed well-nourished alert cooperative   who appears stated age in no acute distress.  HEENT: normocephalic atraumatic , Eyes: PERRL EOM's full, conjunctiva clear, Nares: paten,t no deformity discharge or tenderness., Ears: no deformity EAC's clear TMs with normal landmarks. Mouth: masked  NECK: supple without masses, thyromegaly or bruits. CHEST/PULM:  Clear to auscultation and percussion breath sounds equal no wheeze , rales or rhonchi. No chest wall deformities or tenderness. Breast: normal by inspection . No dimpling, discharge, masses, tenderness or discharge . CV: PMI is nondisplaced, S1 S2 no gallops, murmurs, rubs. Peripheral pulses are full without delay.No JVD .  ABDOMEN: Bowel sounds normal nontender  No guard or rebound, no hepato splenomegal no CVA tenderness.   Extremtities:  No clubbing cyanosis or edema, no  acute joint swelling or redness no focal atrophy NEURO:  Oriented x3, cranial nerves 3-12 appear to be intact, no obvious focal weakness,gait within normal limits no abnormal reflexes or asymmetrical SKIN: No acute rashes normal turgor, color,senile ecchymosis right hand forearm  PSYCH: Oriented, good eye contact, no obvious depression anxiety, cognition and judgment appear normal. LN: no cervical axillary inguinal adenopathy No noted deficits in memory, attention, and speech. Wt Readings from Last 3 Encounters:  08/01/20 146 lb 6.4 oz (66.4 kg)  06/03/20 148 lb 12.8 oz  (67.5 kg)  05/31/20 149 lb (67.6 kg)   Diabetic Foot Exam - Simple   Simple Foot Form Diabetic Foot exam was performed with the following findings: Yes 08/01/2020 12:03 PM  Visual Inspection No deformities, no ulcerations, no other skin breakdown bilaterally: Yes Sensation Testing Intact to touch and monofilament testing bilaterally: Yes Pulse Check Posterior Tibialis and Dorsalis pulse intact bilaterally: Yes Comments      Lab Results  Component Value Date   WBC 3.7 (L) 07/22/2020   HGB 14.3 07/22/2020   HCT 41.5 07/22/2020   PLT 189.0 07/22/2020   GLUCOSE 140 (H) 07/22/2020   CHOL 109 07/22/2020   TRIG 69.0 07/22/2020   HDL 34.30 (L) 07/22/2020   LDLCALC 61 07/22/2020   ALT 19 07/22/2020   AST 16 07/22/2020   NA 138 07/22/2020   K 4.2 07/22/2020   CL 99 07/22/2020   CREATININE 0.68 07/22/2020   BUN 7 07/22/2020   CO2 30 07/22/2020   TSH 2.64 07/22/2020   INR 0.9 01/13/2018   HGBA1C 7.3 (H) 07/22/2020   MICROALBUR <0.7 07/22/2020    ASSESSMENT AND PLAN:  Discussed the following assessment and plan:  Visit for preventive health examination  Type 2 diabetes mellitus with hyperglycemia, unspecified whether long term insulin use ([place])  Subclinical hypothyroidism  Medication management  Hyperlipidemia, unspecified hyperlipidemia type  Senile ecchymosis - needs to stay on asa and ssri consider  dc the FO   Atherosclerosis of native coronary artery of native heart without angina pectoris  Aspirin long-term use Doing well although A1c is up from optimal him when it was below 7 not concerning at this time. Continue medications healthy lifestyle.   Consider taking the Lexapro at night to see if it decreases daytime drowsiness Plan follow-up in 6 months we will do A1c at that time. Earlier as needed. Appears to be up-to-date on immunizations. Patient Care Team: Usher Hedberg, Standley Brooking, MD as PCP - General (Internal Medicine) Martinique, Amy, MD as Consulting Physician  (Dermatology) Dian Queen, MD as Attending Physician (Obstetrics and Gynecology) Stanford Breed Denice Bors, MD as Consulting Physician (Cardiology) Mauri Pole, MD as Consulting Physician (Gastroenterology) Geanie Kenning, MD (Psychiatry)  Patient Instructions  Glad you are doing well. Consider hearing eval if   interfering.   Continue meds .  FU 6 mos and check a1c at that time.   Health Maintenance, Female Adopting a healthy lifestyle and getting preventive care are important in promoting health and wellness. Ask your health care provider about: The right schedule for you to have regular tests and exams. Things you can do on your own to prevent diseases and keep yourself healthy. What should I know about diet, weight, and exercise? Eat a healthy diet  Eat a diet that includes plenty of vegetables, fruits, low-fat dairy products, and lean protein. Do not eat a lot of foods that are high in solid fats, added sugars, or sodium.  Maintain a healthy weight Body mass  index (BMI) is used to identify weight problems. It estimates body fat based on height and weight. Your health care provider can help determineyour BMI and help you achieve or maintain a healthy weight. Get regular exercise Get regular exercise. This is one of the most important things you can do for your health. Most adults should: Exercise for at least 150 minutes each week. The exercise should increase your heart rate and make you sweat (moderate-intensity exercise). Do strengthening exercises at least twice a week. This is in addition to the moderate-intensity exercise. Spend less time sitting. Even light physical activity can be beneficial. Watch cholesterol and blood lipids Have your blood tested for lipids and cholesterol at 69 years of age, then havethis test every 5 years. Have your cholesterol levels checked more often if: Your lipid or cholesterol levels are high. You are older than 69 years of age. You  are at high risk for heart disease. What should I know about cancer screening? Depending on your health history and family history, you may need to have cancer screening at various ages. This may include screening for: Breast cancer. Cervical cancer. Colorectal cancer. Skin cancer. Lung cancer. What should I know about heart disease, diabetes, and high blood pressure? Blood pressure and heart disease High blood pressure causes heart disease and increases the risk of stroke. This is more likely to develop in people who have high blood pressure readings, are of African descent, or are overweight. Have your blood pressure checked: Every 3-5 years if you are 71-31 years of age. Every year if you are 34 years old or older. Diabetes Have regular diabetes screenings. This checks your fasting blood sugar level. Have the screening done: Once every three years after age 66 if you are at a normal weight and have a low risk for diabetes. More often and at a younger age if you are overweight or have a high risk for diabetes. What should I know about preventing infection? Hepatitis B If you have a higher risk for hepatitis B, you should be screened for this virus. Talk with your health care provider to find out if you are at risk forhepatitis B infection. Hepatitis C Testing is recommended for: Everyone born from [birth year] through 1964. Anyone with known risk factors for hepatitis C. Sexually transmitted infections (STIs) Get screened for STIs, including gonorrhea and chlamydia, if: You are sexually active and are younger than 69 years of age. You are older than 69 years of age and your health care provider tells you that you are at risk for this type of infection. Your sexual activity has changed since you were last screened, and you are at increased risk for chlamydia or gonorrhea. Ask your health care provider if you are at risk. Ask your health care provider about whether you are at high risk for HIV.  Your health care provider may recommend a prescription medicine to help prevent HIV infection. If you choose to take medicine to prevent HIV, you should first get tested for HIV. You should then be tested every 3 months for as long as you are taking the medicine. Pregnancy If you are about to stop having your period (premenopausal) and you may become pregnant, seek counseling before you get pregnant. Take 400 to 800 micrograms (mcg) of folic acid every day if you become pregnant. Ask for birth control (contraception) if you want to prevent pregnancy. Osteoporosis and menopause Osteoporosis is a disease in which the bones lose minerals and strength with aging.  This can result in bone fractures. If you are 58 years old or older, or if you are at risk for osteoporosis and fractures, ask your health care provider if you should: Be screened for bone loss. Take a calcium or vitamin D supplement to lower your risk of fractures. Be given hormone replacement therapy (HRT) to treat symptoms of menopause. Follow these instructions at home: Lifestyle Do not use any products that contain nicotine or tobacco, such as cigarettes, e-cigarettes, and chewing tobacco. If you need help quitting, ask your health care provider. Do not use street drugs. Do not share needles. Ask your health care provider for help if you need support or information about quitting drugs. Alcohol use Do not drink alcohol if: Your health care provider tells you not to drink. You are pregnant, may be pregnant, or are planning to become pregnant. If you drink alcohol: Limit how much you use to 0-1 drink a day. Limit intake if you are breastfeeding. Be aware of how much alcohol is in your drink. In the [place] one drink equals one 12 oz bottle of beer (355 mL), one 5 oz glass of wine (148 mL), or one 1 oz glass of hard liquor (44 mL). General instructions Schedule regular health, dental, and eye exams. Stay current with your  vaccines. Tell your health care provider if: You often feel depressed. You have ever been abused or do not feel safe at home. Summary Adopting a healthy lifestyle and getting preventive care are important in promoting health and wellness. Follow your health care provider's instructions about healthy diet, exercising, and getting tested or screened for diseases. Follow your health care provider's instructions on monitoring your cholesterol and blood pressure. This information is not intended to replace advice given to you by your health care provider. Make sure you discuss any questions you have with your healthcare provider. Document Revised: 01/15/2018 Document Reviewed: 01/15/2018 Elsevier Patient Education  2022 [place]. Belen Zwahlen M.D.

## 2020-08-01 ENCOUNTER — Other Ambulatory Visit: Payer: Self-pay

## 2020-08-01 ENCOUNTER — Encounter: Payer: Self-pay | Admitting: Internal Medicine

## 2020-08-01 ENCOUNTER — Ambulatory Visit (INDEPENDENT_AMBULATORY_CARE_PROVIDER_SITE_OTHER): Payer: PPO | Admitting: Internal Medicine

## 2020-08-01 VITALS — BP 100/60 | HR 56 | Temp 97.7°F | Ht 65.0 in | Wt 146.4 lb

## 2020-08-01 DIAGNOSIS — Z79899 Other long term (current) drug therapy: Secondary | ICD-10-CM

## 2020-08-01 DIAGNOSIS — E038 Other specified hypothyroidism: Secondary | ICD-10-CM | POA: Diagnosis not present

## 2020-08-01 DIAGNOSIS — I2510 Atherosclerotic heart disease of native coronary artery without angina pectoris: Secondary | ICD-10-CM

## 2020-08-01 DIAGNOSIS — E785 Hyperlipidemia, unspecified: Secondary | ICD-10-CM

## 2020-08-01 DIAGNOSIS — E1165 Type 2 diabetes mellitus with hyperglycemia: Secondary | ICD-10-CM | POA: Diagnosis not present

## 2020-08-01 DIAGNOSIS — Z0000 Encounter for general adult medical examination without abnormal findings: Secondary | ICD-10-CM | POA: Diagnosis not present

## 2020-08-01 DIAGNOSIS — R233 Spontaneous ecchymoses: Secondary | ICD-10-CM | POA: Diagnosis not present

## 2020-08-01 DIAGNOSIS — Z7982 Long term (current) use of aspirin: Secondary | ICD-10-CM | POA: Diagnosis not present

## 2020-08-01 NOTE — Patient Instructions (Signed)
Glad you are doing well. Consider hearing eval if   interfering.   Continue meds .  FU 6 mos and check a1c at that time.   Health Maintenance, Female Adopting a healthy lifestyle and getting preventive care are important in promoting health and wellness. Ask your health care provider about: The right schedule for you to have regular tests and exams. Things you can do on your own to prevent diseases and keep yourself healthy. What should I know about diet, weight, and exercise? Eat a healthy diet  Eat a diet that includes plenty of vegetables, fruits, low-fat dairy products, and lean protein. Do not eat a lot of foods that are high in solid fats, added sugars, or sodium.  Maintain a healthy weight Body mass index (BMI) is used to identify weight problems. It estimates body fat based on height and weight. Your health care provider can help determineyour BMI and help you achieve or maintain a healthy weight. Get regular exercise Get regular exercise. This is one of the most important things you can do for your health. Most adults should: Exercise for at least 150 minutes each week. The exercise should increase your heart rate and make you sweat (moderate-intensity exercise). Do strengthening exercises at least twice a week. This is in addition to the moderate-intensity exercise. Spend less time sitting. Even light physical activity can be beneficial. Watch cholesterol and blood lipids Have your blood tested for lipids and cholesterol at 69 years of age, then havethis test every 5 years. Have your cholesterol levels checked more often if: Your lipid or cholesterol levels are high. You are older than 69 years of age. You are at high risk for heart disease. What should I know about cancer screening? Depending on your health history and family history, you may need to have cancer screening at various ages. This may include screening for: Breast cancer. Cervical cancer. Colorectal  cancer. Skin cancer. Lung cancer. What should I know about heart disease, diabetes, and high blood pressure? Blood pressure and heart disease High blood pressure causes heart disease and increases the risk of stroke. This is more likely to develop in people who have high blood pressure readings, are of African descent, or are overweight. Have your blood pressure checked: Every 3-5 years if you are 64-36 years of age. Every year if you are 49 years old or older. Diabetes Have regular diabetes screenings. This checks your fasting blood sugar level. Have the screening done: Once every three years after age 47 if you are at a normal weight and have a low risk for diabetes. More often and at a younger age if you are overweight or have a high risk for diabetes. What should I know about preventing infection? Hepatitis B If you have a higher risk for hepatitis B, you should be screened for this virus. Talk with your health care provider to find out if you are at risk forhepatitis B infection. Hepatitis C Testing is recommended for: Everyone born from [birth year] through 1964. Anyone with known risk factors for hepatitis C. Sexually transmitted infections (STIs) Get screened for STIs, including gonorrhea and chlamydia, if: You are sexually active and are younger than 69 years of age. You are older than 69 years of age and your health care provider tells you that you are at risk for this type of infection. Your sexual activity has changed since you were last screened, and you are at increased risk for chlamydia or gonorrhea. Ask your health care provider if  you are at risk. Ask your health care provider about whether you are at high risk for HIV. Your health care provider may recommend a prescription medicine to help prevent HIV infection. If you choose to take medicine to prevent HIV, you should first get tested for HIV. You should then be tested every 3 months for as long as you are taking the  medicine. Pregnancy If you are about to stop having your period (premenopausal) and you may become pregnant, seek counseling before you get pregnant. Take 400 to 800 micrograms (mcg) of folic acid every day if you become pregnant. Ask for birth control (contraception) if you want to prevent pregnancy. Osteoporosis and menopause Osteoporosis is a disease in which the bones lose minerals and strength with aging. This can result in bone fractures. If you are 86 years old or older, or if you are at risk for osteoporosis and fractures, ask your health care provider if you should: Be screened for bone loss. Take a calcium or vitamin D supplement to lower your risk of fractures. Be given hormone replacement therapy (HRT) to treat symptoms of menopause. Follow these instructions at home: Lifestyle Do not use any products that contain nicotine or tobacco, such as cigarettes, e-cigarettes, and chewing tobacco. If you need help quitting, ask your health care provider. Do not use street drugs. Do not share needles. Ask your health care provider for help if you need support or information about quitting drugs. Alcohol use Do not drink alcohol if: Your health care provider tells you not to drink. You are pregnant, may be pregnant, or are planning to become pregnant. If you drink alcohol: Limit how much you use to 0-1 drink a day. Limit intake if you are breastfeeding. Be aware of how much alcohol is in your drink. In the [place] one drink equals one 12 oz bottle of beer (355 mL), one 5 oz glass of wine (148 mL), or one 1 oz glass of hard liquor (44 mL). General instructions Schedule regular health, dental, and eye exams. Stay current with your vaccines. Tell your health care provider if: You often feel depressed. You have ever been abused or do not feel safe at home. Summary Adopting a healthy lifestyle and getting preventive care are important in promoting health and wellness. Follow your health  care provider's instructions about healthy diet, exercising, and getting tested or screened for diseases. Follow your health care provider's instructions on monitoring your cholesterol and blood pressure. This information is not intended to replace advice given to you by your health care provider. Make sure you discuss any questions you have with your healthcare provider. Document Revised: 01/15/2018 Document Reviewed: 01/15/2018 Elsevier Patient Education  2022 [employer].

## 2020-08-04 ENCOUNTER — Telehealth: Payer: Self-pay | Admitting: Internal Medicine

## 2020-08-04 NOTE — Chronic Care Management (AMB) (Signed)
  Chronic Care Management   Outreach Note  08/04/2020 Name: Morgan [name] MRN: [id number] DOB: Oct 01, 1950  Referred by: Burnis Medin, MD Reason for referral : No chief complaint on file.   Third unsuccessful telephone outreach was attempted today. The patient was referred to the pharmacist for assistance with care management and care coordination.   Follow Up Plan:   Tatjana Dellinger [employer] Scheduler

## 2020-08-15 ENCOUNTER — Other Ambulatory Visit: Payer: Self-pay | Admitting: Internal Medicine

## 2020-08-15 DIAGNOSIS — F331 Major depressive disorder, recurrent, moderate: Secondary | ICD-10-CM | POA: Diagnosis not present

## 2020-08-29 ENCOUNTER — Other Ambulatory Visit: Payer: Self-pay | Admitting: Internal Medicine

## 2020-09-21 ENCOUNTER — Encounter: Payer: Self-pay | Admitting: Internal Medicine

## 2020-09-21 DIAGNOSIS — H524 Presbyopia: Secondary | ICD-10-CM | POA: Diagnosis not present

## 2020-09-21 DIAGNOSIS — E119 Type 2 diabetes mellitus without complications: Secondary | ICD-10-CM | POA: Diagnosis not present

## 2020-09-21 DIAGNOSIS — H2513 Age-related nuclear cataract, bilateral: Secondary | ICD-10-CM | POA: Diagnosis not present

## 2020-09-21 LAB — HM DIABETES EYE EXAM

## 2020-11-14 DIAGNOSIS — F331 Major depressive disorder, recurrent, moderate: Secondary | ICD-10-CM | POA: Diagnosis not present

## 2020-11-16 ENCOUNTER — Other Ambulatory Visit: Payer: Self-pay

## 2020-11-16 ENCOUNTER — Ambulatory Visit (INDEPENDENT_AMBULATORY_CARE_PROVIDER_SITE_OTHER): Payer: PPO

## 2020-11-16 DIAGNOSIS — Z0000 Encounter for general adult medical examination without abnormal findings: Secondary | ICD-10-CM | POA: Diagnosis not present

## 2020-11-16 NOTE — Patient Instructions (Signed)
[name] , Thank you for taking time to come for your Medicare Wellness Visit. I appreciate your ongoing commitment to your health goals. Please review the following plan we discussed and let me know if I can assist you in the future.   Screening recommendations/referrals: Colonoscopy: Done 09/29/18 pt will schedule appt with Providers Mammogram: Done 03/12/18 repeat every year pt will confer with physicians for women Bone Density: Done 09/17/14 repeat every 2 years  Recommended yearly ophthalmology/optometry visit for glaucoma screening and checkup Recommended yearly dental visit for hygiene and checkup  Vaccinations: Influenza vaccine: Due and discussed Pneumococcal vaccine: Up to date Tdap vaccine: Done 09/24/14 repeat every 10 years  Shingles vaccine: Shingrix discussed. Please contact your pharmacy for coverage information.    Covid-19:Completed 2/6, 3/1,& 11/01/19  Advanced directives: Please bring a copy of your health care power of attorney and living will to the office at your convenience.  Conditions/risks identified: Stay Healthy   Next appointment: Follow up in one year for your annual wellness visit    Preventive Care 65 Years and Older, Female Preventive care refers to lifestyle choices and visits with your health care provider that can promote health and wellness. What does preventive care include? A yearly physical exam. This is also called an annual well check. Dental exams once or twice a year. Routine eye exams. Ask your health care provider how often you should have your eyes checked. Personal lifestyle choices, including: Daily care of your teeth and gums. Regular physical activity. Eating a healthy diet. Avoiding tobacco and drug use. Limiting alcohol use. Practicing safe sex. Taking low-dose aspirin every day. Taking vitamin and mineral supplements as recommended by your health care provider. What happens during an annual well check? The services and  screenings done by your health care provider during your annual well check will depend on your age, overall health, lifestyle risk factors, and family history of disease. Counseling  Your health care provider may ask you questions about your: Alcohol use. Tobacco use. Drug use. Emotional well-being. Home and relationship well-being. Sexual activity. Eating habits. History of falls. Memory and ability to understand (cognition). Work and work Statistician. Reproductive health. Screening  You may have the following tests or measurements: Height, weight, and BMI. Blood pressure. Lipid and cholesterol levels. These may be checked every 5 years, or more frequently if you are over 24 years old. Skin check. Lung cancer screening. You may have this screening every year starting at age 13 if you have a 30-pack-year history of smoking and currently smoke or have quit within the past 15 years. Fecal occult blood test (FOBT) of the stool. You may have this test every year starting at age 21. Flexible sigmoidoscopy or colonoscopy. You may have a sigmoidoscopy every 5 years or a colonoscopy every 10 years starting at age 67. Hepatitis C blood test. Hepatitis B blood test. Sexually transmitted disease (STD) testing. Diabetes screening. This is done by checking your blood sugar (glucose) after you have not eaten for a while (fasting). You may have this done every 1-3 years. Bone density scan. This is done to screen for osteoporosis. You may have this done starting at age 23. Mammogram. This may be done every 1-2 years. Talk to your health care provider about how often you should have regular mammograms. Talk with your health care provider about your test results, treatment options, and if necessary, the need for more tests. Vaccines  Your health care provider may recommend certain vaccines, such as: Influenza  vaccine. This is recommended every year. Tetanus, diphtheria, and acellular pertussis (Tdap,  Td) vaccine. You may need a Td booster every 10 years. Zoster vaccine. You may need this after age 68. Pneumococcal 13-valent conjugate (PCV13) vaccine. One dose is recommended after age 1. Pneumococcal polysaccharide (PPSV23) vaccine. One dose is recommended after age 56. Talk to your health care provider about which screenings and vaccines you need and how often you need them. This information is not intended to replace advice given to you by your health care provider. Make sure you discuss any questions you have with your health care provider. Document Released: 02/18/2015 Document Revised: 10/12/2015 Document Reviewed: 11/23/2014 Elsevier Interactive Patient Education  2017 [place] Prevention in the Home Falls can cause injuries. They can happen to people of all ages. There are many things you can do to make your home safe and to help prevent falls. What can I do on the outside of my home? Regularly fix the edges of walkways and driveways and fix any cracks. Remove anything that might make you trip as you walk through a door, such as a raised step or threshold. Trim any bushes or trees on the path to your home. Use bright outdoor lighting. Clear any walking paths of anything that might make someone trip, such as rocks or tools. Regularly check to see if handrails are loose or broken. Make sure that both sides of any steps have handrails. Any raised decks and porches should have guardrails on the edges. Have any leaves, snow, or ice cleared regularly. Use sand or salt on walking paths during winter. Clean up any spills in your garage right away. This includes oil or grease spills. What can I do in the bathroom? Use night lights. Install grab bars by the toilet and in the tub and shower. Do not use towel bars as grab bars. Use non-skid mats or decals in the tub or shower. If you need to sit down in the shower, use a plastic, non-slip stool. Keep the floor dry. Clean up any  water that spills on the floor as soon as it happens. Remove soap buildup in the tub or shower regularly. Attach bath mats securely with double-sided non-slip rug tape. Do not have throw rugs and other things on the floor that can make you trip. What can I do in the bedroom? Use night lights. Make sure that you have a light by your bed that is easy to reach. Do not use any sheets or blankets that are too big for your bed. They should not hang down onto the floor. Have a firm chair that has side arms. You can use this for support while you get dressed. Do not have throw rugs and other things on the floor that can make you trip. What can I do in the kitchen? Clean up any spills right away. Avoid walking on wet floors. Keep items that you use a lot in easy-to-reach places. If you need to reach something above you, use a strong step stool that has a grab bar. Keep electrical cords out of the way. Do not use floor polish or wax that makes floors slippery. If you must use wax, use non-skid floor wax. Do not have throw rugs and other things on the floor that can make you trip. What can I do with my stairs? Do not leave any items on the stairs. Make sure that there are handrails on both sides of the stairs and use them. Fix  handrails that are broken or loose. Make sure that handrails are as long as the stairways. Check any carpeting to make sure that it is firmly attached to the stairs. Fix any carpet that is loose or worn. Avoid having throw rugs at the top or bottom of the stairs. If you do have throw rugs, attach them to the floor with carpet tape. Make sure that you have a light switch at the top of the stairs and the bottom of the stairs. If you do not have them, ask someone to add them for you. What else can I do to help prevent falls? Wear shoes that: Do not have high heels. Have rubber bottoms. Are comfortable and fit you well. Are closed at the toe. Do not wear sandals. If you use a  stepladder: Make sure that it is fully opened. Do not climb a closed stepladder. Make sure that both sides of the stepladder are locked into place. Ask someone to hold it for you, if possible. Clearly mark and make sure that you can see: Any grab bars or handrails. First and last steps. Where the edge of each step is. Use tools that help you move around (mobility aids) if they are needed. These include: Canes. Walkers. Scooters. Crutches. Turn on the lights when you go into a dark area. Replace any light bulbs as soon as they burn out. Set up your furniture so you have a clear path. Avoid moving your furniture around. If any of your floors are uneven, fix them. If there are any pets around you, be aware of where they are. Review your medicines with your doctor. Some medicines can make you feel dizzy. This can increase your chance of falling. Ask your doctor what other things that you can do to help prevent falls. This information is not intended to replace advice given to you by your health care provider. Make sure you discuss any questions you have with your health care provider. Document Released: 11/18/2008 Document Revised: 06/30/2015 Document Reviewed: 02/26/2014 Elsevier Interactive Patient Education  2017 [employer].

## 2020-11-16 NOTE — Progress Notes (Addendum)
Virtual Visit via Telephone Note  I connected with  Morgan [name] on 11/16/20 at  8:00 AM EDT by telephone and verified that I am speaking with the correct person using two identifiers.  Medicare Annual Wellness visit completed telephonically due to Covid-19 pandemic.   Persons participating in this call: This Health Coach and this patient.   Location: Patient: Home Provider: Office   I discussed the limitations, risks, security and privacy concerns of performing an evaluation and management service by telephone and the availability of in person appointments. The patient expressed understanding and agreed to proceed.  Unable to perform video visit due to video visit attempted and failed and/or patient does not have video capability.   Some vital signs may be absent or patient reported.   Willette Brace, LPN   Subjective:   Morgan [name] is a 70 y.o. female who presents for Medicare Annual (Subsequent) preventive examination.  Review of Systems     Cardiac Risk Factors include: advanced age (>74men, >34 women);dyslipidemia     Objective:    There were no vitals filed for this visit. There is no height or weight on file to calculate BMI.  Advanced Directives 11/16/2020 01/14/2020 11/11/2019 09/29/2018 02/12/2018  Does Patient Have a Medical Advance Directive? Yes Yes Yes Yes Yes  Type of Paramedic of [place];Living will - [place];Living will [place];Living will [place];Living will  Copy of [place] in Chart? No - copy requested - No - copy requested No - copy requested No - copy requested    Current Medications (verified) Outpatient Encounter Medications as of 11/16/2020  Medication Sig   acetaminophen (TYLENOL) 500 MG tablet Take 1,000 mg by mouth every 6 (six) hours as needed for moderate [name] or headache.   aspirin 81 MG tablet Take 81 mg by mouth daily.      atorvastatin (LIPITOR) 80 MG tablet TAKE 1 TABLET BY MOUTH EVERY DAY AT 6 pm   Cholecalciferol (VITAMIN D3) 125 MCG (5000 UT) CAPS Take 5,000 Units by mouth daily.   clindamycin (CLEOCIN T) 1 % lotion Apply 1 application topically daily.   Cranberry-Vitamin C-Vitamin E (CRANBERRY PLUS VITAMIN C) 4200-20-3 MG-MG-UNIT CAPS Take 2 capsules by mouth daily.   escitalopram (LEXAPRO) 10 MG tablet Take 10 mg by mouth daily.   ezetimibe (ZETIA) 10 MG tablet TAKE 1 TABLET BY MOUTH EVERY DAY   Ferrous Sulfate (IRON) 325 (65 Fe) MG TABS Take 650 mg by mouth daily.    INVOKANA 300 MG TABS tablet TAKE 1 TABLET BY MOUTH EVERY MORNING BEFORE BREAKFAST   Lancet Devices (ONE TOUCH DELICA LANCING DEV) MISC Use as directed to check blood sugars twice to four time per day. Dx E11.9   Lancets (ONETOUCH DELICA PLUS IEPPIR51O) MISC Check blood sugar 2 to 3 times daily.   levothyroxine (SYNTHROID) 50 MCG tablet TAKE 1 TABLET BY MOUTH EVERY DAY   metFORMIN (GLUCOPHAGE-XR) 500 MG 24 hr tablet TAKE 2 TABLETS BY MOUTH 2 TIMES DAILY WITH A MEAL   Multiple Vitamins-Minerals (MULTIVITAMIN PO) Take 1 tablet by mouth daily.    Omega-3 Fatty Acids (FISH OIL) 1000 MG CAPS Take 2,000 mg by mouth daily.   omeprazole (PRILOSEC) 40 MG capsule Take 1 capsule (40 mg total) by mouth 2 (two) times daily.   ONETOUCH ULTRA test strip USE TO CHECK BLOOD SUGAR 2 TO 3 TIMES DAILY   OZEMPIC, 1 MG/DOSE, 4 MG/3ML SOPN  INJECT 1 MG INTO THE SKIN ONCE A WEEK   vitamin B-12 (CYANOCOBALAMIN) 1000 MCG tablet Take 2,000 mcg by mouth daily.   PFIZER-BIONT COVID-19 VAC-TRIS SUSP injection    No facility-administered encounter medications on file as of 11/16/2020.    Allergies (verified) Penicillins   History: Past Medical History:  Diagnosis Date   Anemia    as a child    CAD, NATIVE VESSEL 05/13/2009   Cataracts, bilateral    MD just watching   Complication of anesthesia    DIABETES MELLITUS, TYPE II 12/09/2009   Gastric ulcer    Gastritis     GERD (gastroesophageal reflux disease)    Hx of abnormal cervical Pap smear    cryo  but normal since then    HYPERLIPIDEMIA-MIXED 04/14/2008   Intestinal metaplasia of gastric mucosa 2012   Myocardial infarction ([place])    2003   Obesity    Occult blood in stools    Osteopenia    OVERWEIGHT/OBESITY 05/13/2009   Ulcer    Duodenal ulcer by x-ray 40 years ago   Varicose veins    under rx [place] 2016   Past Surgical History:  Procedure Laterality Date   BIOPSY  09/29/2018   Procedure: BIOPSY;  Surgeon: Irving Copas., MD;  Location: [place];  Service: Gastroenterology;;   BIOPSY  01/14/2020   Procedure: BIOPSY;  Surgeon: Irving Copas., MD;  Location: [place];  Service: Gastroenterology;;   CESAREAN SECTION     x2   CLOSED REDUCTION PROXIMAL TIBIOFIBULAR JOINT [place]  2004   left   COLONOSCOPY     COLONOSCOPY WITH PROPOFOL N/A 09/29/2018   Procedure: COLONOSCOPY WITH PROPOFOL;  Surgeon: Irving Copas., MD;  Location: [place];  Service: Gastroenterology;  Laterality: N/A;   DILATION AND CURETTAGE OF UTERUS     ECTOPIC PREGNANCY SURGERY     ESOPHAGOGASTRODUODENOSCOPY N/A 02/12/2018   Procedure: ESOPHAGOGASTRODUODENOSCOPY (EGD);  Surgeon: Irving Copas., MD;  Location: [facility];  Service: Gastroenterology;  Laterality: N/A;   ESOPHAGOGASTRODUODENOSCOPY N/A 09/29/2018   Procedure: ESOPHAGOGASTRODUODENOSCOPY (EGD);  Surgeon: Irving Copas., MD;  Location: [place];  Service: Gastroenterology;  Laterality: N/A;   ESOPHAGOGASTRODUODENOSCOPY (EGD) WITH PROPOFOL N/A 01/14/2020   Procedure: ESOPHAGOGASTRODUODENOSCOPY (EGD) WITH PROPOFOL;  Surgeon: Rush Landmark Telford Nab., MD;  Location: [place];  Service: Gastroenterology;  Laterality: N/A;   EUS N/A 02/12/2018   Procedure: UPPER ENDOSCOPIC ULTRASOUND (EUS) RADIAL;  Surgeon: Rush Landmark Telford Nab., MD;  Location: [facility];  Service: Gastroenterology;  Laterality: N/A;    FIBULA FRACTURE SURGERY     GANGLION CYST EXCISION     POLYPECTOMY  02/12/2018   Procedure: POLYPECTOMY;  Surgeon: Mansouraty, Telford Nab., MD;  Location: [facility];  Service: Gastroenterology;;   POLYPECTOMY  01/14/2020   Procedure: POLYPECTOMY;  Surgeon: Irving Copas., MD;  [facility];  Service: Gastroenterology;;   TONSILLECTOMY     TUBAL LIGATION     One Tube   UPPER GASTROINTESTINAL ENDOSCOPY     08/27/17   Family History  Problem Relation Age of Onset   Emphysema Mother        Smoker   Heart attack Mother        due to head injury   Alcohol abuse Mother    Heart disease Mother    Early death Mother    Varicose Veins Mother    AAA (abdominal aortic aneurysm) Mother    Lung cancer Father    Heart attack Father  Hypertension Father    Alcohol abuse Father    Heart disease Father        before age 38   Aneurysm Father    Hyperlipidemia Father    Alcohol abuse Brother    Diabetes Brother    Heart disease Brother    Heart attack Brother    Diabetes Brother    Aneurysm Paternal Grandmother    Diabetes Other        grandfather   Sudden Cardiac Death Brother    Colon cancer Neg Hx    Esophageal cancer Neg Hx    Stomach cancer Neg Hx    Inflammatory bowel disease Neg Hx    Liver disease Neg Hx    Pancreatic cancer Neg Hx    Rectal cancer Neg Hx    Social History   Socioeconomic History   Marital status: Widowed    Spouse name: Not on file   Number of children: 2   Years of education: phd    Highest education level: Not on file  Occupational History   Occupation: Mental Health Counselor    Comment: [employer]   Occupation: Nurse, adult: [place]  Tobacco Use   Smoking status: Former    Types: Cigarettes    Quit date: 02/05/1994    Years since quitting: 26.7   Smokeless tobacco: Never  Vaping Use   Vaping Use: Never used  Substance and Sexual Activity   Alcohol use: No    Alcohol/week: 0.0 standard  drinks   Drug use: No   Sexual activity: Not on file  Other Topics Concern   Not on file  Social History Narrative   HH of 2 currently    No tobacco    Separated     Recently widowed   5 14       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    No firearms    NO reg exercise    Pets- Poodles 2    Sleep 7 hours    Social Determinants of Health   Financial Resource Strain: Low Risk    Difficulty of Paying Living Expenses: Not hard at all  Food Insecurity: No Food Insecurity   Worried About Charity fundraiser in the Last Year: Never true   [place] in the Last Year: Never true  Transportation Needs: No Transportation Needs   Lack of Transportation (Medical): No   Lack of Transportation (Non-Medical): No  Physical Activity: Inactive   Days of Exercise per Week: 0 days   Minutes of Exercise per Session: 0 min  Stress: No Stress Concern Present   Feeling of Stress : Not at all  Social Connections: Moderately Integrated   Frequency of Communication with Friends and Family: More than three times a week   Frequency of Social Gatherings with Friends and Family: More than three times a week   Attends Religious Services: 1 to 4 times per year   Active Member of [employer] or Organizations: Yes   Attends Archivist Meetings: 1 to 4 times per year   Marital Status: Widowed    Tobacco Counseling Counseling given: Not Answered   Clinical Intake:  Pre-visit preparation completed: Yes  [name] : No/denies [name]     BMI - recorded: 24.36 Nutritional Status: BMI of 19-24  Normal Nutritional Risks: None Diabetes:  Yes CBG done?: Yes (113) CBG resulted in Enter/ Edit results?: No Did pt. bring in CBG monitor from home?: No  How often do you need to have someone help you when you read instructions, pamphlets, or other written materials from your doctor or pharmacy?: 1 -  Never  Diabetic?Nutrition Risk Assessment:  Has the patient had any N/V/D within the last 2 months?  No  Does the patient have any non-healing wounds?  No  Has the patient had any unintentional weight loss or weight gain?  No   Diabetes:  Is the patient diabetic?  Yes  If diabetic, was a CBG obtained today?  Yes  Did the patient bring in their glucometer from home?  No  How often do you monitor your CBG's? Daily.   Financial Strains and Diabetes Management:  Are you having any financial strains with the device, your supplies or your medication? No .  Does the patient want to be seen by Chronic Care Management for management of their diabetes?  No  Would the patient like to be referred to a Nutritionist or for Diabetic Management?  No   Diabetic Exams:  Diabetic Eye Exam: Completed 09/21/20 Diabetic Foot Exam: Completed 08/01/20   Interpreter Needed?: No  Information entered by :: Charlott Rakes, LPN   Activities of Daily Living In your present state of health, do you have any difficulty performing the following activities: 11/16/2020  Hearing? N  Vision? N  Difficulty concentrating or making decisions? N  Walking or climbing stairs? N  Dressing or bathing? N  Preparing Food and eating ? N  Using the Toilet? N  In the past six months, have you accidently leaked urine? N  Do you have problems with loss of bowel control? N  Managing your Medications? N  Managing your Finances? N  Housekeeping or managing your Housekeeping? N  Some recent data might be hidden    Patient Care Team: Panosh, Standley Brooking, MD as PCP - General (Internal Medicine) Martinique, Amy, MD as Consulting Physician (Dermatology) Dian Queen, MD as Attending Physician (Obstetrics and Gynecology) Stanford Breed Denice Bors, MD as Consulting Physician (Cardiology) Mauri Pole, MD as Consulting Physician (Gastroenterology) Geanie Kenning, MD (Psychiatry)  Indicate any recent Medical Services you may have  received from other than [facility] providers in the past year (date may be approximate).     Assessment:   This is a routine wellness examination for [facility].  Hearing/Vision screen Hearing Screening - Comments:: Pt denies any hearing issues  Vision Screening - Comments:: Pt follows up with Dr Ellie Lunch fr annual eye exams   Dietary issues and exercise activities discussed: Current Exercise Habits: The patient does not participate in regular exercise at present   Goals Addressed             This Visit's Progress    Patient Stated       Stay healthy       Depression Screen PHQ 2/9 Scores 11/16/2020 08/01/2020 11/11/2019 07/24/2019 10/03/2018 09/20/2017 07/09/2016  PHQ - 2 Score 0 0 1 0 0 0 0  PHQ- 9 Score - 2 - 0 - - -    Fall Risk Fall Risk  11/16/2020 11/11/2019 07/24/2019 12/26/2018 10/03/2018  Falls in the past year? 0 0 0 0 0  Number falls in past yr: 0 0 - 0 -  Injury with Fall? 0 0 - 0 0  Risk for fall due to : Impaired vision Impaired vision - History of fall(s) -  Follow  up Falls prevention discussed Falls prevention discussed - Falls evaluation completed -    FALL RISK PREVENTION PERTAINING TO THE HOME:  Any stairs in or around the home? Yes  If so, are there any without handrails? No  Home free of loose throw rugs in walkways, pet beds, electrical cords, etc? Yes  Adequate lighting in your home to reduce risk of falls? Yes   ASSISTIVE DEVICES UTILIZED TO PREVENT FALLS:  Life alert? No  Use of a cane, walker or w/c? No  Grab bars in the bathroom? Yes  Shower chair or bench in shower? Yes  Elevated toilet seat or a handicapped toilet? No   TIMED UP AND GO:  Was the test performed? No .  Cognitive Function:     6CIT Screen 11/16/2020 11/11/2019  What Year? 0 points 0 points  What month? 0 points 0 points  What time? 0 points -  Count back from 20 0 points 0 points  Months in reverse 0 points 0 points  Repeat phrase 0 points 0 points  Total Score 0 -     Immunizations Immunization History  Administered Date(s) Administered   Fluad Quad(high Dose 65+) 10/03/2018, 11/27/2019   Hep A / Hep B 04/14/2010, 05/19/2010, 10/27/2010   Influenza Split 10/27/2010   Influenza, High Dose Seasonal PF 12/20/2016   Influenza,inj,Quad PF,6+ Mos 12/12/2015   PFIZER(Purple Top)SARS-COV-2 Vaccination 03/14/2019, 04/06/2019, 11/01/2019   Pneumococcal Conjugate-13 07/09/2016   Pneumococcal Polysaccharide-23 10/27/2004, 04/14/2010, 07/24/2019   Td 07/08/2004   Tdap 09/24/2014    TDAP status: Up to date  Flu Vaccine status: Due, Education has been provided regarding the importance of this vaccine. Advised may receive this vaccine at local pharmacy or Health Dept. Aware to provide a copy of the vaccination record if obtained from local pharmacy or Health Dept. Verbalized acceptance and understanding.  Pneumococcal vaccine status: Up to date  Covid-19 vaccine status: Completed vaccines  Qualifies for Shingles Vaccine? Yes   Zostavax completed No   Shingrix Completed?: No.    Education has been provided regarding the importance of this vaccine. Patient has been advised to call insurance company to determine out of pocket expense if they have not yet received this vaccine. Advised may also receive vaccine at local pharmacy or Health Dept. Verbalized acceptance and understanding.  Screening Tests Health Maintenance  Topic Date Due   Zoster Vaccines- Shingrix (1 of 2) Never done   COLONOSCOPY (Pts 45-51yrs Insurance coverage will need to be confirmed)  09/29/2019   COVID-19 Vaccine (5 - Booster for Pfizer series) 03/02/2020   MAMMOGRAM  03/12/2020   INFLUENZA VACCINE  09/05/2020   HEMOGLOBIN A1C  01/21/2021   URINE MICROALBUMIN  07/22/2021   FOOT EXAM  08/01/2021   OPHTHALMOLOGY EXAM  09/21/2021   TETANUS/TDAP  09/23/2024   DEXA SCAN  Completed   Hepatitis C Screening  Completed   HPV VACCINES  Aged Out    Health Maintenance  Health Maintenance  Due  Topic Date Due   Zoster Vaccines- Shingrix (1 of 2) Never done   COLONOSCOPY (Pts 45-67yrs Insurance coverage will need to be confirmed)  09/29/2019   COVID-19 Vaccine (5 - Booster for [place] series) 03/02/2020   MAMMOGRAM  03/12/2020   INFLUENZA VACCINE  09/05/2020    Colonoscopy will be scheduled by patient  Mammogram will be done by physicians for women  Bone Density status: Completed 09/17/14. Results reflect: Bone density results: NORMAL. Repeat every 2 years.   Additional Screening:  Hepatitis C  Screening:  Completed 06/20/12  Vision Screening: Recommended annual ophthalmology exams for early detection of glaucoma and other disorders of the eye. Is the patient up to date with their annual eye exam?  Yes  Who is the provider or what is the name of the office in which the patient attends annual eye exams? Dr Ellie Lunch If pt is not established with a provider, would they like to be referred to a provider to establish care? No .   Dental Screening: Recommended annual dental exams for proper oral hygiene  Community Resource Referral / Chronic Care Management: CRR required this visit?  No   CCM required this visit?  No      Plan:     I have personally reviewed and noted the following in the patient's chart:   Medical and social history Use of alcohol, tobacco or illicit drugs  Current medications and supplements including opioid prescriptions.  Functional ability and status Nutritional status Physical activity Advanced directives List of other physicians Hospitalizations, surgeries, and ER visits in previous 12 months Vitals Screenings to include cognitive, depression, and falls Referrals and appointments  In addition, I have reviewed and discussed with patient certain preventive protocols, quality metrics, and best practice recommendations. A written personalized care plan for preventive services as well as general preventive health recommendations were provided  to patient.     Willette Brace, LPN   [id number]   Nurse Notes: None

## 2020-11-30 ENCOUNTER — Other Ambulatory Visit: Payer: Self-pay | Admitting: Internal Medicine

## 2021-01-06 ENCOUNTER — Ambulatory Visit: Payer: PPO | Admitting: Internal Medicine

## 2021-01-09 ENCOUNTER — Ambulatory Visit: Payer: PPO | Admitting: Internal Medicine

## 2021-01-22 NOTE — Progress Notes (Signed)
Chief Complaint  Patient presents with   Follow-up    HPI: Morgan [name] 70 y.o. come in for Chronic disease management   DM : doing ok  ozempic working extremely well.  No lows  130 range .  Cataracts  dr Ellie Lunch.  BP  No problem not checking  recetly .  Had resp infection iun November net home test for covid uri  90 % better but still a lot of clear mucous  no fever  pain blood  sinus pain .   On lexapro helps anxiety  new and  baby due in June  ( [place]) ROS: See pertinent positives and negatives per HPI.  Past Medical History:  Diagnosis Date   Anemia    as a child    CAD, NATIVE VESSEL 05/13/2009   Cataracts, bilateral    MD just watching   Complication of anesthesia    DIABETES MELLITUS, TYPE II 12/09/2009   Gastric ulcer    Gastritis    GERD (gastroesophageal reflux disease)    Hx of abnormal cervical Pap smear    cryo  but normal since then    HYPERLIPIDEMIA-MIXED 04/14/2008   Intestinal metaplasia of gastric mucosa 2012   Myocardial infarction ([place])    2003   Obesity    Occult blood in stools    Osteopenia    OVERWEIGHT/OBESITY 05/13/2009   Ulcer    Duodenal ulcer by x-ray 40 years ago   Varicose veins    under rx lawson 2016    Family History  Problem Relation Age of Onset   Emphysema Mother        Smoker   Heart attack Mother        due to head injury   Alcohol abuse Mother    Heart disease Mother    Early death Mother    Varicose Veins Mother    AAA (abdominal aortic aneurysm) Mother    Lung cancer Father    Heart attack Father    Hypertension Father    Alcohol abuse Father    Heart disease Father        before age 41   Aneurysm Father    Hyperlipidemia Father    Alcohol abuse Brother    Diabetes Brother    Heart disease Brother    Heart attack Brother    Diabetes Brother    Aneurysm Paternal Grandmother    Diabetes Other        grandfather   Sudden Cardiac Death Brother    Colon cancer Neg Hx    Esophageal cancer Neg Hx     Stomach cancer Neg Hx    Inflammatory bowel disease Neg Hx    Liver disease Neg Hx    Pancreatic cancer Neg Hx    Rectal cancer Neg Hx     Social History   Socioeconomic History   Marital status: Widowed    Spouse name: Not on file   Number of children: 2   Years of education: phd    Highest education level: Not on file  Occupational History   Occupation: Mental Health Counselor    Comment: [employer]   Occupation: COUNSELOR    Employer: [employer]  Tobacco Use   Smoking status: Former    Types: Cigarettes    Quit date: 02/05/1994    Years since quitting: 26.9   Smokeless tobacco: Never  Vaping Use   Vaping Use: Never used  Substance and Sexual Activity  Alcohol use: No    Alcohol/week: 0.0 standard drinks   Drug use: No   Sexual activity: Not on file  Other Topics Concern   Not on file  Social History Narrative   HH of 2 currently    No tobacco    Separated     Recently widowed   5 14       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    No firearms    NO reg exercise    Pets- Poodles 2    Sleep 7 hours    Social Determinants of Health   Financial Resource Strain: Low Risk    Difficulty of Paying Living Expenses: Not hard at all  Food Insecurity: No Food Insecurity   Worried About Charity fundraiser in the Last Year: Never true   [place] in the Last Year: Never true  Transportation Needs: No Transportation Needs   Lack of Transportation (Medical): No   Lack of Transportation (Non-Medical): No  Physical Activity: Inactive   Days of Exercise per Week: 0 days   Minutes of Exercise per Session: 0 min  Stress: No Stress Concern Present   Feeling of Stress : Not at all  Social Connections: Moderately Integrated   Frequency of Communication with Friends and Family: More than three times a week   Frequency of Social Gatherings with Friends and  Family: More than three times a week   Attends Religious Services: 1 to 4 times per year   Active Member of [employer] or Organizations: Yes   Attends Archivist Meetings: 1 to 4 times per year   Marital Status: Widowed    Outpatient Medications Prior to Visit  Medication Sig Dispense Refill   acetaminophen (TYLENOL) 500 MG tablet Take 1,000 mg by mouth every 6 (six) hours as needed for moderate pain or headache.     aspirin 81 MG tablet Take 81 mg by mouth daily.       atorvastatin (LIPITOR) 80 MG tablet TAKE 1 TABLET BY MOUTH EVERY DAY AT 6 pm 90 tablet 0   Cholecalciferol (VITAMIN D3) 125 MCG (5000 UT) CAPS Take 5,000 Units by mouth daily.     clindamycin (CLEOCIN T) 1 % lotion Apply 1 application topically daily.     Cranberry-Vitamin C-Vitamin E (CRANBERRY PLUS VITAMIN C) 4200-20-3 MG-MG-UNIT CAPS Take 2 capsules by mouth daily.     escitalopram (LEXAPRO) 10 MG tablet Take 10 mg by mouth daily.     ezetimibe (ZETIA) 10 MG tablet TAKE 1 TABLET BY MOUTH EVERY DAY 90 tablet 3   Ferrous Sulfate (IRON) 325 (65 Fe) MG TABS Take 650 mg by mouth daily.      Lancet Devices (ONE TOUCH DELICA LANCING DEV) MISC Use as directed to check blood sugars twice to four time per day. Dx E11.9 1 each 11   levothyroxine (SYNTHROID) 50 MCG tablet TAKE 1 TABLET BY MOUTH EVERY DAY 90 tablet 2   metFORMIN (GLUCOPHAGE-XR) 500 MG 24 hr tablet TAKE 2 TABLETS BY MOUTH 2 TIMES DAILY WITH A MEAL 360 tablet 0   Multiple Vitamins-Minerals (MULTIVITAMIN PO) Take 1 tablet by mouth daily.      Omega-3 Fatty Acids (FISH OIL) 1000 MG CAPS Take 2,000 mg by mouth daily.     omeprazole (PRILOSEC) 40 MG capsule TAKE 1 CAPSULE BY MOUTH 2 TIMES DAILY 180  capsule 2   OZEMPIC, 1 MG/DOSE, 4 MG/3ML SOPN INJECT 1 MG INTO THE SKIN ONCE A WEEK 12 mL 5   vitamin B-12 (CYANOCOBALAMIN) 1000 MCG tablet Take 2,000 mcg by mouth daily.     INVOKANA 300 MG TABS tablet TAKE 1 TABLET BY MOUTH EVERY MORNING BEFORE BREAKFAST 90 tablet 0    Lancets (ONETOUCH DELICA PLUS [id number]) MISC Check blood sugar 2 to 3 times daily. 100 each 1   ONETOUCH ULTRA test strip USE TO CHECK BLOOD SUGAR 2 TO 3 TIMES DAILY 100 strip 1   PFIZER-BIONT COVID-19 VAC-TRIS SUSP injection      No facility-administered medications prior to visit.     EXAM:  BP 116/70 (BP Location: Left Arm, Patient Position: Sitting, Cuff Size: Normal)    Pulse 64    Temp 97.9 F (36.6 C) (Oral)    Ht 5\' 5"  (1.651 m)    Wt 154 lb 3.2 oz (69.9 kg)    LMP  (LMP Unknown)    SpO2 97%    BMI 25.66 kg/m   Body mass index is 25.66 kg/m.  GENERAL: vitals reviewed and listed above, alert, oriented, appears well hydrated and in no acute distress HEENT: atraumatic, conjunctiva  clear, no obvious abnormalities on inspection of external nose and ears OP masked  NECK: no obvious masses on inspection palpation  LUNGS: clear to auscultation bilaterally, no wheezes, rales or rhonchi, good air movement CV: HRRR, no clubbing cyanosis or  peripheral edema nl cap refill  Abdomen:  Sof,t normal bowel sounds without hepatosplenomegaly, no guarding rebound or masses no CVA tenderness MS: moves all extremities without noticeable focal  abnormality PSYCH: pleasant and cooperative, no obvious depression or anxiety Lab Results  Component Value Date   WBC 3.7 (L) 07/22/2020   HGB 14.3 07/22/2020   HCT 41.5 07/22/2020   PLT 189.0 07/22/2020   GLUCOSE 140 (H) 07/22/2020   CHOL 109 07/22/2020   TRIG 69.0 07/22/2020   HDL 34.30 (L) 07/22/2020   LDLCALC 61 07/22/2020   ALT 19 07/22/2020   AST 16 07/22/2020   NA 138 07/22/2020   K 4.2 07/22/2020   CL 99 07/22/2020   CREATININE 0.68 07/22/2020   BUN 7 07/22/2020   CO2 30 07/22/2020   TSH 2.64 07/22/2020   INR 0.9 01/13/2018   HGBA1C 7.5 (A) 01/23/2021   MICROALBUR <0.7 07/22/2020   BP Readings from Last 3 Encounters:  01/23/21 116/70  08/01/20 100/60  06/03/20 102/60    ASSESSMENT AND PLAN:  Discussed the following  assessment and plan:  Type 2 diabetes mellitus with hyperglycemia, unspecified whether long term insulin use (HCC) - Plan: POC HgB A1c  Subclinical hypothyroidism  Medication management  Hyperlipidemia, unspecified hyperlipidemia type  History of upper respiratory infection  Dm stable on triple meds   tolerating metformin ozemic and invokanna    will continue  options to add other meds but doing well and is pleased with current progress.  No neuropathy or retinopathy issues  Bp cointrolled  plan labs and cpx in June July  or as needed  -Patient advised to return or notify health care team  if  new concerns arise.  Patient Instructions  I think the respiratory sx are convalescent from viral uri.  If relapsing fever pain  etc.  Contact  medical team  Glad  you are doing well .   Plan  yearly cpx in June . I will place future orders  to get labs pre visit  if you wish or day of .      Standley Brooking. Neal Trulson M.D.

## 2021-01-23 ENCOUNTER — Ambulatory Visit (INDEPENDENT_AMBULATORY_CARE_PROVIDER_SITE_OTHER): Payer: PPO | Admitting: Internal Medicine

## 2021-01-23 ENCOUNTER — Other Ambulatory Visit: Payer: Self-pay | Admitting: Internal Medicine

## 2021-01-23 ENCOUNTER — Encounter: Payer: Self-pay | Admitting: Internal Medicine

## 2021-01-23 VITALS — BP 116/70 | HR 64 | Temp 97.9°F | Ht 65.0 in | Wt 154.2 lb

## 2021-01-23 DIAGNOSIS — E785 Hyperlipidemia, unspecified: Secondary | ICD-10-CM | POA: Diagnosis not present

## 2021-01-23 DIAGNOSIS — E038 Other specified hypothyroidism: Secondary | ICD-10-CM

## 2021-01-23 DIAGNOSIS — E1165 Type 2 diabetes mellitus with hyperglycemia: Secondary | ICD-10-CM

## 2021-01-23 DIAGNOSIS — Z8709 Personal history of other diseases of the respiratory system: Secondary | ICD-10-CM | POA: Diagnosis not present

## 2021-01-23 DIAGNOSIS — Z79899 Other long term (current) drug therapy: Secondary | ICD-10-CM

## 2021-01-23 DIAGNOSIS — I2510 Atherosclerotic heart disease of native coronary artery without angina pectoris: Secondary | ICD-10-CM

## 2021-01-23 LAB — POCT GLYCOSYLATED HEMOGLOBIN (HGB A1C): Hemoglobin A1C: 7.5 % — AB (ref 4.0–5.6)

## 2021-01-23 NOTE — Patient Instructions (Signed)
I think the respiratory sx are convalescent from viral uri.  If relapsing fever pain  etc.  Contact  medical team  Glad  you are doing well .   Plan  yearly cpx in June . I will place future orders  to get labs pre visit  if you wish or day of .

## 2021-01-24 DIAGNOSIS — H2513 Age-related nuclear cataract, bilateral: Secondary | ICD-10-CM | POA: Diagnosis not present

## 2021-01-24 DIAGNOSIS — H52203 Unspecified astigmatism, bilateral: Secondary | ICD-10-CM | POA: Diagnosis not present

## 2021-03-02 ENCOUNTER — Other Ambulatory Visit: Payer: Self-pay | Admitting: Internal Medicine

## 2021-03-10 ENCOUNTER — Telehealth: Payer: Self-pay

## 2021-03-10 NOTE — Telephone Encounter (Signed)
I intiated a PA for the pt's Invokana 300 mg on 03/07/2021. The PA was sent to the pt's insurance and was approved on 03/09/2021.  Key: [id number] Left a message for the pt to return my call in regards to approval.

## 2021-03-13 DIAGNOSIS — F331 Major depressive disorder, recurrent, moderate: Secondary | ICD-10-CM | POA: Diagnosis not present

## 2021-04-06 DIAGNOSIS — H2512 Age-related nuclear cataract, left eye: Secondary | ICD-10-CM | POA: Diagnosis not present

## 2021-04-06 DIAGNOSIS — H25812 Combined forms of age-related cataract, left eye: Secondary | ICD-10-CM | POA: Diagnosis not present

## 2021-04-20 DIAGNOSIS — H2511 Age-related nuclear cataract, right eye: Secondary | ICD-10-CM | POA: Diagnosis not present

## 2021-04-20 DIAGNOSIS — H25811 Combined forms of age-related cataract, right eye: Secondary | ICD-10-CM | POA: Diagnosis not present

## 2021-05-29 ENCOUNTER — Other Ambulatory Visit: Payer: Self-pay | Admitting: Internal Medicine

## 2021-06-03 ENCOUNTER — Other Ambulatory Visit: Payer: Self-pay | Admitting: Internal Medicine

## 2021-06-08 ENCOUNTER — Other Ambulatory Visit: Payer: Self-pay | Admitting: Cardiology

## 2021-06-08 ENCOUNTER — Other Ambulatory Visit: Payer: Self-pay | Admitting: Internal Medicine

## 2021-06-25 ENCOUNTER — Encounter: Payer: Self-pay | Admitting: Internal Medicine

## 2021-06-25 DIAGNOSIS — R350 Frequency of micturition: Secondary | ICD-10-CM

## 2021-06-25 DIAGNOSIS — Z87440 Personal history of urinary (tract) infections: Secondary | ICD-10-CM

## 2021-06-26 ENCOUNTER — Telehealth: Payer: Self-pay | Admitting: Internal Medicine

## 2021-06-26 NOTE — Telephone Encounter (Signed)
Get her to do urine with micro and u cx at [facility] lab and then can do virtual visit to evaluate.  Can add her on at 12 noon  or otherwise can work in  other times of day depending on her schedule.

## 2021-06-26 NOTE — Telephone Encounter (Signed)
Pt is unable to come in this week to have urine test to change for UTI and pt would like abx  [facility] [place], [place] - [address] Phone:  [phone]  Fax:  [phone]

## 2021-06-26 NOTE — Telephone Encounter (Signed)
Please advise 

## 2021-06-26 NOTE — Addendum Note (Signed)
Addended by: Geradine Girt D on: 06/26/2021 08:46 AM   Modules accepted: Orders

## 2021-06-26 NOTE — Telephone Encounter (Signed)
See encounter note.

## 2021-06-27 ENCOUNTER — Other Ambulatory Visit (INDEPENDENT_AMBULATORY_CARE_PROVIDER_SITE_OTHER): Payer: PPO

## 2021-06-27 DIAGNOSIS — Z87440 Personal history of urinary (tract) infections: Secondary | ICD-10-CM | POA: Diagnosis not present

## 2021-06-27 DIAGNOSIS — R350 Frequency of micturition: Secondary | ICD-10-CM | POA: Diagnosis not present

## 2021-06-27 LAB — URINALYSIS, ROUTINE W REFLEX MICROSCOPIC
Bilirubin Urine: NEGATIVE
Ketones, ur: NEGATIVE
Nitrite: NEGATIVE
Specific Gravity, Urine: <=1.005 — AB (ref 1.000–1.030)
Total Protein, Urine: NEGATIVE
Urine Glucose: >=1000 — AB
Urobilinogen, UA: 0.2 (ref 0.0–1.0)
pH: 6 (ref 5.0–8.0)

## 2021-06-27 LAB — MICROALBUMIN / CREATININE URINE RATIO
Creatinine,U: 29.8 mg/dL
Microalb Creat Ratio: 9.1 mg/g (ref 0.0–30.0)
Microalb, Ur: 2.7 mg/dL — ABNORMAL HIGH (ref 0.0–1.9)

## 2021-06-27 MED ORDER — NITROFURANTOIN MONOHYD MACRO 100 MG PO CAPS: 100.0000 mg | ORAL_CAPSULE | Freq: Two times a day (BID) | ORAL | 0 refills | Status: DC

## 2021-06-27 MED ORDER — NITROFURANTOIN MONOHYD MACRO 100 MG PO CAPS: 100.0000 mg | ORAL_CAPSULE | Freq: Two times a day (BID) | ORAL | 0 refills | Status: AC

## 2021-06-27 NOTE — Telephone Encounter (Signed)
Sure  [name]

## 2021-06-27 NOTE — Telephone Encounter (Signed)
So last  e coli bacteria was  resistant to  oral medication except for macobid and cipro .  We can  try cipro this time   short course     will send in cirpo 500 bid for 3 days awaiting cutlure results  Cipro has risk of se  neuropathy tendon rupture    risk of c diff higher than Macrobid  If  you  prefer we can use macrobid again until culture is back  Let me know   preference before I send in antibiotic

## 2021-06-30 LAB — URINE CULTURE
MICRO NUMBER:: 13433458
SPECIMEN QUALITY:: ADEQUATE

## 2021-07-03 NOTE — Progress Notes (Signed)
\  urine culture shows e coli  sensitive to medication given . Macrobid Should resolve with current treatment .FU if not better.

## 2021-07-13 DIAGNOSIS — L299 Pruritus, unspecified: Secondary | ICD-10-CM | POA: Diagnosis not present

## 2021-07-13 DIAGNOSIS — D229 Melanocytic nevi, unspecified: Secondary | ICD-10-CM | POA: Diagnosis not present

## 2021-07-13 DIAGNOSIS — L578 Other skin changes due to chronic exposure to nonionizing radiation: Secondary | ICD-10-CM | POA: Diagnosis not present

## 2021-07-13 DIAGNOSIS — L814 Other melanin hyperpigmentation: Secondary | ICD-10-CM | POA: Diagnosis not present

## 2021-07-13 DIAGNOSIS — R233 Spontaneous ecchymoses: Secondary | ICD-10-CM | POA: Diagnosis not present

## 2021-07-13 DIAGNOSIS — D1801 Hemangioma of skin and subcutaneous tissue: Secondary | ICD-10-CM | POA: Diagnosis not present

## 2021-07-13 DIAGNOSIS — L821 Other seborrheic keratosis: Secondary | ICD-10-CM | POA: Diagnosis not present

## 2021-07-20 ENCOUNTER — Encounter: Payer: Self-pay | Admitting: Internal Medicine

## 2021-08-01 DIAGNOSIS — L810 Postinflammatory hyperpigmentation: Secondary | ICD-10-CM | POA: Diagnosis not present

## 2021-08-01 DIAGNOSIS — L299 Pruritus, unspecified: Secondary | ICD-10-CM | POA: Diagnosis not present

## 2021-08-01 DIAGNOSIS — L700 Acne vulgaris: Secondary | ICD-10-CM | POA: Diagnosis not present

## 2021-08-01 DIAGNOSIS — Z411 Encounter for cosmetic surgery: Secondary | ICD-10-CM | POA: Diagnosis not present

## 2021-08-07 ENCOUNTER — Other Ambulatory Visit: Payer: Self-pay | Admitting: Internal Medicine

## 2021-08-13 NOTE — Progress Notes (Unsigned)
No chief complaint on file.   HPI: Patient  Morgan [name]  71 y.o. comes in today for Preventive Health Care visit   and multiple medical problems. GI   DM    Health Maintenance  Topic Date Due   Zoster Vaccines- Shingrix (1 of 2) Never done   COVID-19 Vaccine (5 - Booster for Pfizer series) 12/27/2019   HEMOGLOBIN A1C  07/24/2021   FOOT EXAM  08/01/2021   COLONOSCOPY (Pts 45-55yr Insurance coverage will need to be confirmed)  01/23/2022 (Originally 09/29/2019)   INFLUENZA VACCINE  09/05/2021   OPHTHALMOLOGY EXAM  09/21/2021   MAMMOGRAM  03/07/2022   URINE MICROALBUMIN  06/28/2022   TETANUS/TDAP  09/23/2024   Pneumonia Vaccine 70 Years old  Completed   DEXA SCAN  Completed   Hepatitis C Screening  Completed   HPV VACCINES  Aged Out   Health Maintenance Review LIFESTYLE:  Exercise:   Tobacco/ETS: Alcohol:  Sugar beverages: Sleep: Drug use: no HH of  Work:    ROS:  GEN/ HEENT: No fever, significant weight changes sweats headaches vision problems hearing changes, CV/ PULM; No chest pain shortness of breath cough, syncope,edema  change in exercise tolerance. GI /GU: No adominal pain, vomiting, change in bowel habits. No blood in the stool. No significant GU symptoms. SKIN/HEME: ,no acute skin rashes suspicious lesions or bleeding. No lymphadenopathy, nodules, masses.  NEURO/ PSYCH:  No neurologic signs such as weakness numbness. No depression anxiety. IMM/ Allergy: No unusual infections.  Allergy .   REST of 12 system review negative except as per HPI   Past Medical History:  Diagnosis Date   Anemia    as a child    CAD, NATIVE VESSEL 05/13/2009   Cataracts, bilateral    MD just watching   Complication of anesthesia    DIABETES MELLITUS, TYPE II 12/09/2009   Gastric ulcer    Gastritis    GERD (gastroesophageal reflux disease)    Hx of abnormal cervical Pap smear    cryo  but normal since then    HYPERLIPIDEMIA-MIXED 04/14/2008   Intestinal  metaplasia of gastric mucosa 2012   [place]    [place]   Obesity    Occult blood in stools    Osteopenia    OVERWEIGHT/OBESITY 05/13/2009   Ulcer    Duodenal ulcer by x-ray 40 years ago   Varicose veins    under rx lawson 2016    Past Surgical History:  Procedure Laterality Date   BIOPSY  09/29/2018   Procedure: BIOPSY;  Surgeon: MIrving Copas, MD;  [facility]  Service: Gastroenterology;;   BIOPSY  01/14/2020   Procedure: BIOPSY;  Surgeon: MIrving Copas, MD;  [facility]  Service: Gastroenterology;;   CESAREAN SECTION     x2   CLOSED REDUCTION PROXIMAL TIBIOFIBULAR JOINT [facility] 2004   left   COLONOSCOPY     COLONOSCOPY WITH PROPOFOL N/A 09/29/2018   Procedure: COLONOSCOPY WITH PROPOFOL;  Surgeon: MIrving Copas, MD;  [facility]  Service: Gastroenterology;  Laterality: N/A;   DILATION AND CURETTAGE OF UTERUS     ECTOPIC PREGNANCY SURGERY     ESOPHAGOGASTRODUODENOSCOPY N/A 02/12/2018   Procedure: ESOPHAGOGASTRODUODENOSCOPY (EGD);  Surgeon: MIrving Copas, MD;  [facility];  Service: Gastroenterology;  Laterality: N/A;   ESOPHAGOGASTRODUODENOSCOPY N/A 09/29/2018   Procedure: ESOPHAGOGASTRODUODENOSCOPY (EGD);  Surgeon: MIrving Copas, MD;  [facility]  Service: Gastroenterology;  Laterality: N/A;   ESOPHAGOGASTRODUODENOSCOPY (EGD)  WITH PROPOFOL N/A 01/14/2020   Procedure: ESOPHAGOGASTRODUODENOSCOPY (EGD) WITH PROPOFOL;  Surgeon: Rush Landmark Telford Nab., MD;  Location: [facility];  Service: Gastroenterology;  Laterality: N/A;   EUS N/A 02/12/2018   Procedure: UPPER ENDOSCOPIC ULTRASOUND (EUS) RADIAL;  Surgeon: Rush Landmark Telford Nab., MD;  [facility];  Service: Gastroenterology;  Laterality: N/A;   FIBULA FRACTURE SURGERY     GANGLION CYST EXCISION     POLYPECTOMY  02/12/2018   Procedure: POLYPECTOMY;  Surgeon: Mansouraty, Telford Nab., MD;  [facility];  Service: Gastroenterology;;   POLYPECTOMY  01/14/2020   Procedure: POLYPECTOMY;  Surgeon: Irving Copas., MD;  Location: [place];  Service: Gastroenterology;;   TONSILLECTOMY     TUBAL LIGATION     One Tube   UPPER GASTROINTESTINAL ENDOSCOPY     08/27/17    Family History  Problem Relation Age of Onset   Emphysema Mother        Smoker   Heart attack Mother        due to head injury   Alcohol abuse Mother    Heart disease Mother    Early death Mother    Varicose Veins Mother    AAA (abdominal aortic aneurysm) Mother    Lung cancer Father    Heart attack Father    Hypertension Father    Alcohol abuse Father    Heart disease Father        before age 72   Aneurysm Father    Hyperlipidemia Father    Alcohol abuse Brother    Diabetes Brother    Heart disease Brother    Heart attack Brother    Diabetes Brother    Aneurysm Paternal Grandmother    Diabetes Other        grandfather   Sudden Cardiac Death Brother    Colon cancer Neg Hx    Esophageal cancer Neg Hx    Stomach cancer Neg Hx    Inflammatory bowel disease Neg Hx    Liver disease Neg Hx    Pancreatic cancer Neg Hx    Rectal cancer Neg Hx     Social History   Socioeconomic History   Marital status: Widowed    Spouse name: Not on file   Number of children: 2   Years of education: phd    Highest education level: Not on file  Occupational History   Occupation: Mental Health Counselor    Comment: [employer]   Occupation: Nurse, adult: [employer]  Tobacco Use   Smoking status: Former    Types: Cigarettes    Quit date: 02/05/1994    Years since quitting: 27.5   Smokeless tobacco: Never  Vaping Use   Vaping Use: Never used  Substance and Sexual Activity   Alcohol use: No    Alcohol/week: 0.0 standard drinks of alcohol   Drug use: No   Sexual activity: Not on file  Other Topics Concern   Not on file  Social History Narrative   HH of 2 currently    No  tobacco    Separated     Recently widowed   5 14       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    No firearms    NO reg exercise    Pets- Poodles 2    Sleep 7 hours  Social Determinants of Health   Financial Resource Strain: Low Risk  (11/16/2020)   Overall Financial Resource Strain (CARDIA)    Difficulty of Paying Living Expenses: Not hard at all  Food Insecurity: No Food Insecurity (11/16/2020)   Hunger Vital Sign    Worried About Running Out of Food in the Last Year: Never true    Ran Out of Food in the Last Year: Never true  Transportation Needs: No Transportation Needs (11/16/2020)   PRAPARE - Hydrologist (Medical): No    Lack of Transportation (Non-Medical): No  Physical Activity: Inactive (11/16/2020)   Exercise Vital Sign    Days of Exercise per Week: 0 days    Minutes of Exercise per Session: 0 min  Stress: No Stress Concern Present (11/16/2020)   [place]    Feeling of Stress : Not at all  Social Connections: Moderately Integrated (11/16/2020)   Social Connection and Isolation Panel [NHANES]    Frequency of Communication with Friends and Family: More than three times a week    Frequency of Social Gatherings with Friends and Family: More than three times a week    Attends Religious Services: 1 to 4 times per year    Active Member of [employer] or Organizations: Yes    Attends Archivist Meetings: 1 to 4 times per year    Marital Status: Widowed    Outpatient Medications Prior to Visit  Medication Sig Dispense Refill   acetaminophen (TYLENOL) 500 MG tablet Take 1,000 mg by mouth every 6 (six) hours as needed for moderate pain or headache.     aspirin 81 MG tablet Take 81 mg by mouth daily.       atorvastatin (LIPITOR) 80 MG tablet TAKE 1 TABLET BY MOUTH EVERY DAY AT 6 PM 90  tablet 0   Cholecalciferol (VITAMIN D3) 125 MCG (5000 UT) CAPS Take 5,000 Units by mouth daily.     clindamycin (CLEOCIN T) 1 % lotion Apply 1 application topically daily.     Cranberry-Vitamin C-Vitamin E (CRANBERRY PLUS VITAMIN C) 4200-20-3 MG-MG-UNIT CAPS Take 2 capsules by mouth daily.     escitalopram (LEXAPRO) 10 MG tablet Take 10 mg by mouth daily.     ezetimibe (ZETIA) 10 MG tablet TAKE 1 TABLET BY MOUTH EVERY DAY 90 tablet 1   Ferrous Sulfate (IRON) 325 (65 Fe) MG TABS Take 650 mg by mouth daily.      INVOKANA 300 MG TABS tablet TAKE 1 TABLET BY MOUTH EVERY DAY BEFORE breakfast 90 tablet 0   Lancet Devices (ONE TOUCH DELICA LANCING DEV) MISC Use as directed to check blood sugars twice to four time per day. Dx E11.9 1 each 11   Lancets (ONETOUCH DELICA PLUS [id number]) MISC USE TO check blood sugar 2-3 TIMES A DAY 100 each 1   levothyroxine (SYNTHROID) 50 MCG tablet TAKE 1 TABLET BY MOUTH EVERY DAY 90 tablet 2   metFORMIN (GLUCOPHAGE-XR) 500 MG 24 hr tablet TAKE 2 TABLETS BY MOUTH 2 TIMES DAILY WITH A meal 360 tablet 0   Multiple Vitamins-Minerals (MULTIVITAMIN PO) Take 1 tablet by mouth daily.      Omega-3 Fatty Acids (FISH OIL) 1000 MG CAPS Take 2,000 mg by mouth daily.     omeprazole (PRILOSEC) 40 MG capsule TAKE 1 CAPSULE BY MOUTH 2 TIMES DAILY 180 capsule 2   ONETOUCH ULTRA test strip USE TO check blood sugar 2-3 TIMES A DAY 100  strip 1   OZEMPIC, 1 MG/DOSE, 4 MG/3ML SOPN INJECT 1 MG INTO THE SKIN ONCE A WEEK 12 mL 5   PFIZER-BIONT COVID-19 VAC-TRIS SUSP injection      vitamin B-12 (CYANOCOBALAMIN) 1000 MCG tablet Take 2,000 mcg by mouth daily.     No facility-administered medications prior to visit.     EXAM:  LMP  (LMP Unknown)   There is no height or weight on file to calculate BMI. Wt Readings from Last 3 Encounters:  01/23/21 154 lb 3.2 oz (69.9 kg)  08/01/20 146 lb 6.4 oz (66.4 kg)  06/03/20 148 lb 12.8 oz (67.5 kg)    Physical Exam: Vital signs  reviewed [id number]:[id number] is a well-developed well-nourished alert cooperative    who appearsr stated age in no acute distress.  HEENT: normocephalic atraumatic , Eyes: PERRL EOM's full, conjunctiva clear, Nares: paten,t no deformity discharge or tenderness., Ears: no deformity EAC's clear TMs with normal landmarks. Mouth: clear OP, no lesions, edema.  Moist mucous membranes. Dentition in adequate repair. NECK: supple without masses, thyromegaly or bruits. CHEST/PULM:  Clear to auscultation and percussion breath sounds equal no wheeze , rales or rhonchi. No chest wall deformities or tenderness. Breast: normal by inspection . No dimpling, discharge, masses, tenderness or discharge . CV: PMI is nondisplaced, S1 S2 no gallops, murmurs, rubs. Peripheral pulses are full without delay.No JVD .  ABDOMEN: Bowel sounds normal nontender  No guard or rebound, no hepato splenomegal no CVA tenderness.  No hernia. Extremtities:  No clubbing cyanosis or edema, no acute joint swelling or redness no focal atrophy NEURO:  Oriented x3, cranial nerves 3-12 appear to be intact, no obvious focal weakness,gait within normal limits no abnormal reflexes or asymmetrical SKIN: No acute rashes normal turgor, color, no bruising or petechiae. PSYCH: Oriented, good eye contact, no obvious depression anxiety, cognition and judgment appear normal. LN: no cervical axillary inguinal adenopathy  Lab Results  Component Value Date   WBC 3.7 (L) 07/22/2020   HGB 14.3 07/22/2020   HCT 41.5 07/22/2020   PLT 189.0 07/22/2020   GLUCOSE 140 (H) 07/22/2020   CHOL 109 07/22/2020   TRIG 69.0 07/22/2020   HDL 34.30 (L) 07/22/2020   LDLCALC 61 07/22/2020   ALT 19 07/22/2020   AST 16 07/22/2020   NA 138 07/22/2020   K 4.2 07/22/2020   CL 99 07/22/2020   CREATININE 0.68 07/22/2020   BUN 7 07/22/2020   CO2 30 07/22/2020   TSH 2.64 07/22/2020   INR 0.9 01/13/2018   HGBA1C 7.5 (A) 01/23/2021   MICROALBUR 2.7 (H) 06/27/2021    BP Readings  from Last 3 Encounters:  01/23/21 116/70  08/01/20 100/60  06/03/20 102/60    Lab plan  results reviewed with patient  orders are in system  ASSESSMENT AND PLAN:  Discussed the following assessment and plan:    ICD-10-CM   1. Visit for preventive health examination  Z00.00     2. Medication management  Z79.899     3. Hyperlipidemia, unspecified hyperlipidemia type  E78.5     4. Type 2 diabetes mellitus with hyperglycemia, without long-term current use of insulin (HCC)  E11.65     5. History of recurrent UTIs  Z87.440      No follow-ups on file.  Patient Care Team: Yumna Ebers, Standley Brooking, MD as PCP - General (Internal Medicine) [place]ique, Amy, MD as Consulting Physician (Dermatology) Dian Queen, MD as Attending Physician (Obstetrics and Gynecology) Lelon Perla, MD as Consulting Physician (  Cardiology) Mauri Pole, MD as Consulting Physician (Gastroenterology) Geanie Kenning, MD (Psychiatry) There are no Patient Instructions on file for this visit.  Standley Brooking. Tya Haughey M.D.

## 2021-08-14 ENCOUNTER — Ambulatory Visit (INDEPENDENT_AMBULATORY_CARE_PROVIDER_SITE_OTHER): Payer: PPO | Admitting: Internal Medicine

## 2021-08-14 ENCOUNTER — Telehealth: Payer: Self-pay | Admitting: Internal Medicine

## 2021-08-14 ENCOUNTER — Encounter: Payer: Self-pay | Admitting: Internal Medicine

## 2021-08-14 VITALS — BP 104/60 | HR 58 | Temp 97.9°F | Ht 65.25 in | Wt 154.0 lb

## 2021-08-14 DIAGNOSIS — E038 Other specified hypothyroidism: Secondary | ICD-10-CM

## 2021-08-14 DIAGNOSIS — M545 Low back pain, unspecified: Secondary | ICD-10-CM

## 2021-08-14 DIAGNOSIS — Z0000 Encounter for general adult medical examination without abnormal findings: Secondary | ICD-10-CM

## 2021-08-14 DIAGNOSIS — I2510 Atherosclerotic heart disease of native coronary artery without angina pectoris: Secondary | ICD-10-CM | POA: Diagnosis not present

## 2021-08-14 DIAGNOSIS — E1165 Type 2 diabetes mellitus with hyperglycemia: Secondary | ICD-10-CM

## 2021-08-14 DIAGNOSIS — E785 Hyperlipidemia, unspecified: Secondary | ICD-10-CM | POA: Diagnosis not present

## 2021-08-14 DIAGNOSIS — Z79899 Other long term (current) drug therapy: Secondary | ICD-10-CM | POA: Diagnosis not present

## 2021-08-14 DIAGNOSIS — Z87440 Personal history of urinary (tract) infections: Secondary | ICD-10-CM

## 2021-08-14 DIAGNOSIS — F331 Major depressive disorder, recurrent, moderate: Secondary | ICD-10-CM | POA: Diagnosis not present

## 2021-08-14 LAB — CBC WITH DIFFERENTIAL/PLATELET
Basophils Absolute: 0 10*3/uL (ref 0.0–0.1)
Basophils Relative: 0.7 % (ref 0.0–3.0)
Eosinophils Absolute: 0.1 10*3/uL (ref 0.0–0.7)
Eosinophils Relative: 1.7 % (ref 0.0–5.0)
HCT: 43.8 % (ref 36.0–46.0)
Hemoglobin: 14.9 g/dL (ref 12.0–15.0)
Lymphocytes Relative: 23.9 % (ref 12.0–46.0)
Lymphs Abs: 1.5 10*3/uL (ref 0.7–4.0)
MCHC: 33.9 g/dL (ref 30.0–36.0)
MCV: 94.1 fl (ref 78.0–100.0)
Monocytes Absolute: 0.3 10*3/uL (ref 0.1–1.0)
Monocytes Relative: 5.1 % (ref 3.0–12.0)
Neutro Abs: 4.3 10*3/uL (ref 1.4–7.7)
Neutrophils Relative %: 68.6 % (ref 43.0–77.0)
Platelets: 198 10*3/uL (ref 150.0–400.0)
RBC: 4.66 Mil/uL (ref 3.87–5.11)
RDW: 13.2 % (ref 11.5–15.5)
WBC: 6.3 10*3/uL (ref 4.0–10.5)

## 2021-08-14 LAB — BASIC METABOLIC PANEL
BUN: 15 mg/dL (ref 6–23)
CO2: 28 mEq/L (ref 19–32)
Calcium: 10.2 mg/dL (ref 8.4–10.5)
Chloride: 95 mEq/L — ABNORMAL LOW (ref 96–112)
Creatinine, Ser: 0.7 mg/dL (ref 0.40–1.20)
GFR: 87.66 mL/min (ref >60.00)
Glucose, Bld: 188 mg/dL — ABNORMAL HIGH (ref 70–99)
Potassium: 4.3 mEq/L (ref 3.5–5.1)
Sodium: 133 mEq/L — ABNORMAL LOW (ref 135–145)

## 2021-08-14 LAB — HEPATIC FUNCTION PANEL
ALT: 25 U/L (ref 0–35)
AST: 19 U/L (ref 0–37)
Albumin: 4.5 g/dL (ref 3.5–5.2)
Alkaline Phosphatase: 64 U/L (ref 39–117)
Bilirubin, Direct: 0.2 mg/dL (ref 0.0–0.3)
Total Bilirubin: 0.9 mg/dL (ref 0.2–1.2)
Total Protein: 6.8 g/dL (ref 6.0–8.3)

## 2021-08-14 LAB — LIPID PANEL
Cholesterol: 142 mg/dL (ref 0–200)
HDL: 44.2 mg/dL (ref >39.00)
LDL Cholesterol: 71 mg/dL (ref 0–99)
NonHDL: 98.13
Total CHOL/HDL Ratio: 3
Triglycerides: 136 mg/dL (ref 0.0–149.0)
VLDL: 27.2 mg/dL (ref 0.0–40.0)

## 2021-08-14 LAB — MICROALBUMIN / CREATININE URINE RATIO
Creatinine,U: 16.8 mg/dL
Microalb Creat Ratio: 4.2 mg/g (ref 0.0–30.0)
Microalb, Ur: <0.7 mg/dL (ref 0.0–1.9)

## 2021-08-14 LAB — TSH: TSH: 2.83 u[IU]/mL (ref 0.35–5.50)

## 2021-08-14 LAB — HEMOGLOBIN A1C: Hgb A1c MFr Bld: 8.4 % — ABNORMAL HIGH (ref 4.6–6.5)

## 2021-08-14 MED ORDER — LORAZEPAM 0.5 MG PO TABS: ORAL_TABLET | ORAL | 0 refills | Status: AC

## 2021-08-14 NOTE — Patient Instructions (Addendum)
Consider  increasing the ozempic Stay hydrated .  Lab today .  Will send in  pre procedure  medication.   Plan follow up depending on  medication results . 3-6 months

## 2021-08-14 NOTE — Telephone Encounter (Signed)
Patient was seen today  Please advise

## 2021-08-14 NOTE — Telephone Encounter (Signed)
Rx sent in

## 2021-08-14 NOTE — Telephone Encounter (Signed)
Morgan [name] @ [facility] called to say Pt is waiting for anxiety medication to take before her  appt. And they have not received anything yet.  Please advise  Pharmacy [phone]

## 2021-08-18 ENCOUNTER — Telehealth: Payer: Self-pay

## 2021-08-18 ENCOUNTER — Encounter: Payer: Self-pay | Admitting: Internal Medicine

## 2021-08-18 MED ORDER — OZEMPIC (1 MG/DOSE) 4 MG/3ML ~~LOC~~ SOPN: 1.0000 mg | PEN_INJECTOR | SUBCUTANEOUS | 5 refills | Status: DC

## 2021-08-18 NOTE — Telephone Encounter (Signed)
Semaglutide, 1 MG/DOSE, (OZEMPIC, 1 MG/DOSE,) 4 MG/3ML SOPN  Rx sent to pharmacy

## 2021-08-21 ENCOUNTER — Other Ambulatory Visit: Payer: Self-pay

## 2021-08-21 ENCOUNTER — Telehealth: Payer: Self-pay | Admitting: Internal Medicine

## 2021-08-21 MED ORDER — OZEMPIC (1 MG/DOSE) 4 MG/3ML ~~LOC~~ SOPN: 2.0000 mg | PEN_INJECTOR | SUBCUTANEOUS | 5 refills | Status: DC

## 2021-08-21 NOTE — Telephone Encounter (Signed)
Called in stating she needs a refill on ozempic before Sunday and wants to know if it will be increased to '2mg'$ . The pharmacy where she usually goes is backordered until September. States refill was sent to [facility] on [address] for only '1mg'$ , which is the correct dosage but needs the '2mg'$  sent to  [place], [place] - [place] AT [address] Phone:  [phone]  Fax:  [phone]

## 2021-08-21 NOTE — Telephone Encounter (Signed)
Rx was sent to the pharmacy  

## 2021-08-21 NOTE — Telephone Encounter (Signed)
Jesusita Oka with [facility] is calling the directions does not match qty  Semaglutide, 1 MG/DOSE, (OZEMPIC, 1 MG/DOSE,) 4 MG/3ML SOPN 12 mL 5 08/21/2021    Sig - Route: Inject 2 mg into the skin once a week. - Subcutaneous    [facility] [place], [place] AT [address] [place] Phone:  [phone]  Fax:  [phone]

## 2021-08-21 NOTE — Addendum Note (Signed)
Addended by: Geradine Girt D on: 08/21/2021 01:53 PM   Modules accepted: Orders

## 2021-08-22 NOTE — Telephone Encounter (Signed)
Do not understand . Please investigate  Disp  enough  to do 2 mg per week for 4 weeks with refills   .

## 2021-08-22 NOTE — Telephone Encounter (Signed)
Last Ov 08/14/21 Please advise

## 2021-08-24 ENCOUNTER — Encounter: Payer: Self-pay | Admitting: Internal Medicine

## 2021-08-24 ENCOUNTER — Other Ambulatory Visit: Payer: Self-pay | Admitting: Internal Medicine

## 2021-08-24 MED ORDER — SEMAGLUTIDE (2 MG/DOSE) 8 MG/3ML ~~LOC~~ SOPN: 2.0000 mg | PEN_INJECTOR | SUBCUTANEOUS | 2 refills | Status: DC

## 2021-08-24 NOTE — Addendum Note (Signed)
Addended by: Geradine Girt D on: 08/24/2021 09:15 AM   Modules accepted: Orders

## 2021-08-24 NOTE — Telephone Encounter (Signed)
Rx sent to the pharmacy.

## 2021-08-28 ENCOUNTER — Other Ambulatory Visit: Payer: Self-pay | Admitting: Internal Medicine

## 2021-08-29 ENCOUNTER — Other Ambulatory Visit: Payer: Self-pay | Admitting: Internal Medicine

## 2021-08-29 MED ORDER — SEMAGLUTIDE (2 MG/DOSE) 8 MG/3ML ~~LOC~~ SOPN: 2.0000 mg | PEN_INJECTOR | SUBCUTANEOUS | 2 refills | Status: DC

## 2021-08-29 NOTE — Telephone Encounter (Signed)
So I didn't seet his  message until today was unavailable  4 days    Thought all was taken care of.  Apologies  Im not sure I understand what  happened and why your med was not covered .    We wanted to try increasing dosage  of ozempic to 2 mg    because a1c is 8.4 now  you could again to call  other pharmacies  to see if and where available   We do not know on a  given week what is available and not.  ( Other meds also  with shortages)   Alternatively  we can try changing all together to mounjaro ... should be covered but cannot guarantee.

## 2021-08-29 NOTE — Addendum Note (Signed)
Addended by: Geradine Girt D on: 08/29/2021 04:48 PM   Modules accepted: Orders

## 2021-09-08 DIAGNOSIS — Z1231 Encounter for screening mammogram for malignant neoplasm of breast: Secondary | ICD-10-CM | POA: Diagnosis not present

## 2021-09-08 DIAGNOSIS — Z01419 Encounter for gynecological examination (general) (routine) without abnormal findings: Secondary | ICD-10-CM | POA: Diagnosis not present

## 2021-09-08 DIAGNOSIS — Z6825 Body mass index (BMI) 25.0-25.9, adult: Secondary | ICD-10-CM | POA: Diagnosis not present

## 2021-09-13 ENCOUNTER — Other Ambulatory Visit: Payer: Self-pay | Admitting: Internal Medicine

## 2021-10-17 ENCOUNTER — Ambulatory Visit: Payer: PPO | Admitting: Cardiology

## 2021-11-02 DIAGNOSIS — L700 Acne vulgaris: Secondary | ICD-10-CM | POA: Diagnosis not present

## 2021-11-02 DIAGNOSIS — Z411 Encounter for cosmetic surgery: Secondary | ICD-10-CM | POA: Diagnosis not present

## 2021-11-02 DIAGNOSIS — L821 Other seborrheic keratosis: Secondary | ICD-10-CM | POA: Diagnosis not present

## 2021-11-02 DIAGNOSIS — L810 Postinflammatory hyperpigmentation: Secondary | ICD-10-CM | POA: Diagnosis not present

## 2021-11-02 DIAGNOSIS — L299 Pruritus, unspecified: Secondary | ICD-10-CM | POA: Diagnosis not present

## 2021-11-04 ENCOUNTER — Other Ambulatory Visit: Payer: Self-pay | Admitting: Internal Medicine

## 2021-11-13 ENCOUNTER — Encounter: Payer: Self-pay | Admitting: Internal Medicine

## 2021-11-13 ENCOUNTER — Other Ambulatory Visit: Payer: Self-pay | Admitting: Internal Medicine

## 2021-11-15 NOTE — Telephone Encounter (Signed)
Not sure   Unless pharmacist has  info ( I would   still call around different pharmacies   )  I will ask our pharmacy  person if she has other ideas

## 2021-11-16 ENCOUNTER — Other Ambulatory Visit: Payer: Self-pay

## 2021-11-16 ENCOUNTER — Other Ambulatory Visit (HOSPITAL_BASED_OUTPATIENT_CLINIC_OR_DEPARTMENT_OTHER): Payer: Self-pay

## 2021-11-16 DIAGNOSIS — E1165 Type 2 diabetes mellitus with hyperglycemia: Secondary | ICD-10-CM

## 2021-11-16 MED ORDER — SEMAGLUTIDE (2 MG/DOSE) 8 MG/3ML ~~LOC~~ SOPN
2.0000 mg | PEN_INJECTOR | SUBCUTANEOUS | 2 refills | Status: DC
  Filled 2021-11-16: qty 3, 28d supply, fill #0
  Filled 2021-12-16 – 2022-01-18 (×2): qty 3, 28d supply, fill #1
  Filled 2022-02-08: qty 3, 28d supply, fill #2

## 2021-11-16 NOTE — Telephone Encounter (Signed)
Per pharmacist, Watt Climes. Ozempic '2mg'$  is sent to [place] at [place].

## 2021-11-23 NOTE — Progress Notes (Signed)
HPI: FU CAD. Previous PCI of her RCA following myocardial infarction in 2003. Echocardiogram in 2004 showed normal LV function. Abd ultrasound 11/15 showed no aneurysm. Nuclear study 9/16 showed EF 57 and inability to evaluate inferior wall but otherwise normal perfusion. Carotid Dopplers October 2017 showed 1-39% stenosis. There is note of abnormal thyroid tissue and dedicated thyroid ultrasound suggested. Thyroid ultrasound November 2017 showed borderline enlargement with moderately heterogeneous thyroid with no discrete nodule. Since last seen, she denies dyspnea, chest pain or syncope.  She does have some fatigue.  Current Outpatient Medications  Medication Sig Dispense Refill   acetaminophen (TYLENOL) 500 MG tablet Take 1,000 mg by mouth every 6 (six) hours as needed for moderate pain or headache.     aspirin 81 MG tablet Take 81 mg by mouth daily.       atorvastatin (LIPITOR) 80 MG tablet TAKE 1 TABLET BY MOUTH ONCE DAILY AT 6PM 90 tablet 1   Cholecalciferol (VITAMIN D3) 125 MCG (5000 UT) CAPS Take 5,000 Units by mouth daily.     clindamycin (CLEOCIN T) 1 % lotion Apply 1 application topically daily.     Cranberry-Vitamin C-Vitamin E (CRANBERRY PLUS VITAMIN C) 4200-20-3 MG-MG-UNIT CAPS Take 2 capsules by mouth daily.     escitalopram (LEXAPRO) 10 MG tablet Take 10 mg by mouth daily.     ezetimibe (ZETIA) 10 MG tablet TAKE 1 TABLET BY MOUTH EVERY DAY 90 tablet 1   Ferrous Sulfate (IRON) 325 (65 Fe) MG TABS Take 650 mg by mouth daily.      INVOKANA 300 MG TABS tablet TAKE 1 TABLET BY MOUTH EVERY DAY BEFORE breakfast 90 tablet 0   Lancet Devices (ONE TOUCH DELICA LANCING DEV) MISC Use as directed to check blood sugars twice to four time per day. Dx E11.9 1 each 11   Lancets (ONETOUCH DELICA PLUS SAYTKZ60F) MISC USE TO check blood sugar 2-3 TIMES A DAY 100 each 1   levothyroxine (SYNTHROID) 50 MCG tablet TAKE 1 TABLET BY MOUTH EVERY DAY 90 tablet 1   LORazepam (ATIVAN) 0.5 MG tablet Take  1-2 tabs    ( 0.5-1 mg) pre procedure 8 tablet 0   metFORMIN (GLUCOPHAGE-XR) 500 MG 24 hr tablet TAKE 2 TABLETS BY MOUTH TWICE DAILY WITH MEALS 360 tablet 1   Multiple Vitamins-Minerals (MULTIVITAMIN PO) Take 1 tablet by mouth daily.      Omega-3 Fatty Acids (FISH OIL) 1000 MG CAPS Take 2,000 mg by mouth daily.     omeprazole (PRILOSEC) 40 MG capsule TAKE 1 CAPSULE BY MOUTH 2 TIMES DAILY 180 capsule 1   ONETOUCH ULTRA test strip USE TO check blood sugar 2-3 TIMES A DAY 100 strip 1   PFIZER-BIONT COVID-19 VAC-TRIS SUSP injection      Semaglutide, 2 MG/DOSE, 8 MG/3ML SOPN Inject 2 mg as directed once a week. 3 mL 2   tretinoin (RETIN-A) 0.025 % cream Apply topically.     vitamin B-12 (CYANOCOBALAMIN) 1000 MCG tablet Take 2,000 mcg by mouth daily.     No current facility-administered medications for this visit.     Past Medical History:  Diagnosis Date   Anemia    as a child    CAD, NATIVE VESSEL 05/13/2009   Cataracts, bilateral    MD just watching   Complication of anesthesia    DIABETES MELLITUS, TYPE II 12/09/2009   Gastric ulcer    Gastritis    GERD (gastroesophageal reflux disease)    Hx of abnormal cervical  Pap smear    cryo  but normal since then    HYPERLIPIDEMIA-MIXED 04/14/2008   Intestinal metaplasia of gastric mucosa 2012   Myocardial infarction [facility])    2003   Obesity    Occult blood in stools    Osteopenia    OVERWEIGHT/OBESITY 05/13/2009   Ulcer    Duodenal ulcer by x-ray 40 years ago   Varicose veins    under rx [facility] 2016    Past Surgical History:  Procedure Laterality Date   BIOPSY  09/29/2018   Procedure: BIOPSY;  Surgeon: Irving Copas., MD;  Location: [place];  Service: Gastroenterology;;   BIOPSY  01/14/2020   Procedure: BIOPSY;  Surgeon: Irving Copas., MD;  Location: [place];  Service: Gastroenterology;;   CESAREAN SECTION     x2   CLOSED REDUCTION PROXIMAL TIBIOFIBULAR JOINT [place]  2004   left   COLONOSCOPY      COLONOSCOPY WITH PROPOFOL N/A 09/29/2018   Procedure: COLONOSCOPY WITH PROPOFOL;  Surgeon: Irving Copas., MD;  Location: [place];  Service: Gastroenterology;  Laterality: N/A;   DILATION AND CURETTAGE OF UTERUS     ECTOPIC PREGNANCY SURGERY     ESOPHAGOGASTRODUODENOSCOPY N/A 02/12/2018   Procedure: ESOPHAGOGASTRODUODENOSCOPY (EGD);  Surgeon: Irving Copas., MD;  Location: [facility];  Service: Gastroenterology;  Laterality: N/A;   ESOPHAGOGASTRODUODENOSCOPY N/A 09/29/2018   Procedure: ESOPHAGOGASTRODUODENOSCOPY (EGD);  Surgeon: Irving Copas., MD;  Location: [place];  Service: Gastroenterology;  Laterality: N/A;   ESOPHAGOGASTRODUODENOSCOPY (EGD) WITH PROPOFOL N/A 01/14/2020   Procedure: ESOPHAGOGASTRODUODENOSCOPY (EGD) WITH PROPOFOL;  Surgeon: Rush Landmark Telford Nab., MD;  Location: [place];  Service: Gastroenterology;  Laterality: N/A;   EUS N/A 02/12/2018   Procedure: UPPER ENDOSCOPIC ULTRASOUND (EUS) RADIAL;  Surgeon: Rush Landmark Telford Nab., MD;  Location: [facility];  Service: Gastroenterology;  Laterality: N/A;   FIBULA FRACTURE SURGERY     GANGLION CYST EXCISION     POLYPECTOMY  02/12/2018   Procedure: POLYPECTOMY;  Surgeon: Rush Landmark Telford Nab., MD;  Location: [facility];  Service: Gastroenterology;;   POLYPECTOMY  01/14/2020   Procedure: POLYPECTOMY;  Surgeon: Irving Copas., MD;  Location: [place];  Service: Gastroenterology;;   TONSILLECTOMY     TUBAL LIGATION     One Tube   UPPER GASTROINTESTINAL ENDOSCOPY     08/27/17    Social History   Socioeconomic History   Marital status: Widowed    Spouse name: Not on file   Number of children: 2   Years of education: phd    Highest education level: Not on file  Occupational History   Occupation: Mental Health Counselor    Comment: [employer]   Occupation: Nurse, adult: [employer]  Tobacco Use   Smoking status: Former    Types: Cigarettes     Quit date: 02/05/1994    Years since quitting: 27.8   Smokeless tobacco: Never  Vaping Use   Vaping Use: Never used  Substance and Sexual Activity   Alcohol use: No    Alcohol/week: 0.0 standard drinks of alcohol   Drug use: No   Sexual activity: Not on file  Other Topics Concern   Not on file  Social History Narrative   HH of 2 currently    No tobacco    Separated     Recently widowed   36 14       Fulltime 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 Self employed. PHD education fom [employer].    No firearms    NO reg exercise    Pets- Poodles 2    Sleep 7 hours    Social Determinants of Health   Financial Resource Strain: Low Risk  (11/16/2020)   Overall Financial Resource Strain (CARDIA)    Difficulty of Paying Living Expenses: Not hard at all  Food Insecurity: No Food Insecurity (11/16/2020)   Hunger Vital Sign    Worried About Running Out of Food in the Last Year: Never true    Ran Out of Food in the Last Year: Never true  Transportation Needs: No Transportation Needs (11/16/2020)   PRAPARE - Hydrologist (Medical): No    Lack of Transportation (Non-Medical): No  Physical Activity: Inactive (11/16/2020)   Exercise Vital Sign    Days of Exercise per Week: 0 days    Minutes of Exercise per Session: 0 min  Stress: No Stress Concern Present (11/16/2020)   [place]    Feeling of Stress : Not at all  Social Connections: Moderately Integrated (11/16/2020)   Social Connection and Isolation Panel [NHANES]    Frequency of Communication with Friends and Family: More than three times a week    Frequency of Social Gatherings with Friends and Family: More than three times a week    Attends Religious Services: 1 to 4 times per year    Active Member of [employer] or Organizations: Yes    Attends Archivist Meetings: 1 to 4 times per year    Marital  Status: Widowed  Intimate Partner Violence: Not At Risk (11/16/2020)   Humiliation, Afraid, Rape, and Kick questionnaire    Fear of Current or Ex-Partner: No    Emotionally Abused: No    Physically Abused: No    Sexually Abused: No    Family History  Problem Relation Age of Onset   Emphysema Mother        Smoker   Heart attack Mother        due to head injury   Alcohol abuse Mother    Heart disease Mother    Early death Mother    Varicose Veins Mother    AAA (abdominal aortic aneurysm) Mother    Lung cancer Father    Heart attack Father    Hypertension Father    Alcohol abuse Father    Heart disease Father        before age 56   Aneurysm Father    Hyperlipidemia Father    Alcohol abuse Brother    Diabetes Brother    Heart disease Brother    Heart attack Brother    Diabetes Brother    Aneurysm Paternal Grandmother    Diabetes Other        grandfather   Sudden Cardiac Death Brother    Colon cancer Neg Hx    Esophageal cancer Neg Hx    Stomach cancer Neg Hx    Inflammatory bowel disease Neg Hx    Liver disease Neg Hx    Pancreatic cancer Neg Hx    Rectal cancer Neg Hx     ROS: no fevers or chills, productive cough, hemoptysis, dysphasia, odynophagia, melena, hematochezia, dysuria, hematuria, rash, seizure activity, orthopnea, PND, pedal edema, claudication. Remaining systems are negative.  Physical Exam: Well-developed well-nourished in no acute distress.  Skin is warm and dry.  HEENT is normal.  Neck is  supple.  Chest is clear to auscultation with normal expansion.  Cardiovascular exam is regular rate and rhythm.  Abdominal exam nontender or distended. No masses palpated. Extremities show no edema. neuro grossly intact  ECG-NSR, no ST changes personally reviewed  A/P  1 coronary artery disease-patient remains asymptomatic.  We will continue medical therapy with aspirin and statin.  2 hyperlipidemia-continue Lipitor and Zetia.  3 history of carotid  artery disease-mild on most recent Dopplers.  Kirk Ruths, MD

## 2021-11-29 ENCOUNTER — Ambulatory Visit (INDEPENDENT_AMBULATORY_CARE_PROVIDER_SITE_OTHER): Payer: PPO

## 2021-11-29 ENCOUNTER — Ambulatory Visit: Payer: PPO

## 2021-11-29 VITALS — Ht 65.25 in | Wt 154.0 lb

## 2021-11-29 DIAGNOSIS — Z0000 Encounter for general adult medical examination without abnormal findings: Secondary | ICD-10-CM

## 2021-11-29 NOTE — Patient Instructions (Addendum)
Morgan [name] , Thank you for taking time to come for your Medicare Wellness Visit. I appreciate your ongoing commitment to your health goals. Please review the following plan we discussed and let me know if I can assist you in the future.   These are the goals we discussed:  Goals       Patient Stated (pt-stated)      Stay Healthy.       Patient Stated      Stay healthy        This is a list of the screening recommended for you and due dates:  Health Maintenance  Topic Date Due   Eye exam for diabetics  11/29/2021*   Complete foot exam   11/30/2021*   Colon Cancer Screening  01/23/2022*   COVID-19 Vaccine (5 - Pfizer risk series) 02/14/2022*   Zoster (Shingles) Vaccine (1 of 2) 02/14/2022*   Flu Shot  05/06/2022*   Hemoglobin A1C  02/14/2022   Mammogram  03/07/2022   Yearly kidney function blood test for diabetes  08/15/2022   Yearly kidney health urinalysis for diabetes  08/15/2022   Medicare Annual Wellness Visit  12/30/2022   Tetanus Vaccine  09/23/2024   Pneumonia Vaccine  Completed   DEXA scan (bone density measurement)  Completed   Hepatitis C Screening: USPSTF Recommendation to screen - Ages 31-79 yo.  Completed   HPV Vaccine  Aged Out  *Topic was postponed. The date shown is not the original due date.    Advanced directives: Please bring a copy of your health care power of attorney and living will to the office to be added to your chart at your convenience.   Conditions/risks identified: None  Next appointment: Follow up in one year for your annual wellness visit     Preventive Care 65 Years and Older, Female Preventive care refers to lifestyle choices and visits with your health care provider that can promote health and wellness. What does preventive care include? A yearly physical exam. This is also called an annual well check. Dental exams once or twice a year. Routine eye exams. Ask your health care provider how often you should have your eyes  checked. Personal lifestyle choices, including: Daily care of your teeth and gums. Regular physical activity. Eating a healthy diet. Avoiding tobacco and drug use. Limiting alcohol use. Practicing safe sex. Taking low-dose aspirin every day. Taking vitamin and mineral supplements as recommended by your health care provider. What happens during an annual well check? The services and screenings done by your health care provider during your annual well check will depend on your age, overall health, lifestyle risk factors, and family history of disease. Counseling  Your health care provider may ask you questions about your: Alcohol use. Tobacco use. Drug use. Emotional well-being. Home and relationship well-being. Sexual activity. Eating habits. History of falls. Memory and ability to understand (cognition). Work and work Statistician. Reproductive health. Screening  You may have the following tests or measurements: Height, weight, and BMI. Blood pressure. Lipid and cholesterol levels. These may be checked every 5 years, or more frequently if you are over 70 years old. Skin check. Lung cancer screening. You may have this screening every year starting at age 67 if you have a 30-pack-year history of smoking and currently smoke or have quit within the past 15 years. Fecal occult blood test (FOBT) of the stool. You may have this test every year starting at age 25. Flexible sigmoidoscopy or colonoscopy. You may have a  sigmoidoscopy every 5 years or a colonoscopy every 10 years starting at age 56. Hepatitis C blood test. Hepatitis B blood test. Sexually transmitted disease (STD) testing. Diabetes screening. This is done by checking your blood sugar (glucose) after you have not eaten for a while (fasting). You may have this done every 1-3 years. Bone density scan. This is done to screen for osteoporosis. You may have this done starting at age 21. Mammogram. This may be done every 1-2  years. Talk to your health care provider about how often you should have regular mammograms. Talk with your health care provider about your test results, treatment options, and if necessary, the need for more tests. Vaccines  Your health care provider may recommend certain vaccines, such as: Influenza vaccine. This is recommended every year. Tetanus, diphtheria, and acellular pertussis (Tdap, Td) vaccine. You may need a Td booster every 10 years. Zoster vaccine. You may need this after age 35. Pneumococcal 13-valent conjugate (PCV13) vaccine. One dose is recommended after age 30. Pneumococcal polysaccharide (PPSV23) vaccine. One dose is recommended after age 27. Talk to your health care provider about which screenings and vaccines you need and how often you need them. This information is not intended to replace advice given to you by your health care provider. Make sure you discuss any questions you have with your health care provider. Document Released: 02/18/2015 Document Revised: 10/12/2015 Document Reviewed: 11/23/2014 Elsevier Interactive Patient Education  2017 [place] Prevention in the Home Falls can cause injuries. They can happen to people of all ages. There are many things you can do to make your home safe and to help prevent falls. What can I do on the outside of my home? Regularly fix the edges of walkways and driveways and fix any cracks. Remove anything that might make you trip as you walk through a door, such as a raised step or threshold. Trim any bushes or trees on the path to your home. Use bright outdoor lighting. Clear any walking paths of anything that might make someone trip, such as rocks or tools. Regularly check to see if handrails are loose or broken. Make sure that both sides of any steps have handrails. Any raised decks and porches should have guardrails on the edges. Have any leaves, snow, or ice cleared regularly. Use sand or salt on walking paths  during winter. Clean up any spills in your garage right away. This includes oil or grease spills. What can I do in the bathroom? Use night lights. Install grab bars by the toilet and in the tub and shower. Do not use towel bars as grab bars. Use non-skid mats or decals in the tub or shower. If you need to sit down in the shower, use a plastic, non-slip stool. Keep the floor dry. Clean up any water that spills on the floor as soon as it happens. Remove soap buildup in the tub or shower regularly. Attach bath mats securely with double-sided non-slip rug tape. Do not have throw rugs and other things on the floor that can make you trip. What can I do in the bedroom? Use night lights. Make sure that you have a light by your bed that is easy to reach. Do not use any sheets or blankets that are too big for your bed. They should not hang down onto the floor. Have a firm chair that has side arms. You can use this for support while you get dressed. Do not have throw rugs and other  things on the floor that can make you trip. What can I do in the kitchen? Clean up any spills right away. Avoid walking on wet floors. Keep items that you use a lot in easy-to-reach places. If you need to reach something above you, use a strong step stool that has a grab bar. Keep electrical cords out of the way. Do not use floor polish or wax that makes floors slippery. If you must use wax, use non-skid floor wax. Do not have throw rugs and other things on the floor that can make you trip. What can I do with my stairs? Do not leave any items on the stairs. Make sure that there are handrails on both sides of the stairs and use them. Fix handrails that are broken or loose. Make sure that handrails are as long as the stairways. Check any carpeting to make sure that it is firmly attached to the stairs. Fix any carpet that is loose or worn. Avoid having throw rugs at the top or bottom of the stairs. If you do have throw  rugs, attach them to the floor with carpet tape. Make sure that you have a light switch at the top of the stairs and the bottom of the stairs. If you do not have them, ask someone to add them for you. What else can I do to help prevent falls? Wear shoes that: Do not have high heels. Have rubber bottoms. Are comfortable and fit you well. Are closed at the toe. Do not wear sandals. If you use a stepladder: Make sure that it is fully opened. Do not climb a closed stepladder. Make sure that both sides of the stepladder are locked into place. Ask someone to hold it for you, if possible. Clearly mark and make sure that you can see: Any grab bars or handrails. First and last steps. Where the edge of each step is. Use tools that help you move around (mobility aids) if they are needed. These include: Canes. Walkers. Scooters. Crutches. Turn on the lights when you go into a dark area. Replace any light bulbs as soon as they burn out. Set up your furniture so you have a clear path. Avoid moving your furniture around. If any of your floors are uneven, fix them. If there are any pets around you, be aware of where they are. Review your medicines with your doctor. Some medicines can make you feel dizzy. This can increase your chance of falling. Ask your doctor what other things that you can do to help prevent falls. This information is not intended to replace advice given to you by your health care provider. Make sure you discuss any questions you have with your health care provider. Document Released: 11/18/2008 Document Revised: 06/30/2015 Document Reviewed: 02/26/2014 Elsevier Interactive Patient Education  2017 [employer].

## 2021-11-29 NOTE — Progress Notes (Signed)
Subjective:   Morgan [name] is a 71 y.o. female who presents for Medicare Annual (Subsequent) preventive examination.  Review of Systems    Virtual Visit via Telephone Note  I connected with  Morgan [name] on 11/29/21 at  8:15 AM EDT by telephone and verified that I am speaking with the correct person using two identifiers.  Location: Patient: Home Provider: Office Persons participating in the virtual visit: patient/Nurse Health Advisor   I discussed the limitations, risks, security and privacy concerns of performing an evaluation and management service by telephone and the availability of in person appointments. The patient expressed understanding and agreed to proceed.  Interactive audio and video telecommunications were attempted between this nurse and patient, however failed, due to patient having technical difficulties OR patient did not have access to video capability.  We continued and completed visit with audio only.  Some vital signs may be absent or patient reported.   Criselda Peaches, LPN  Cardiac Risk Factors include: advanced age (>60mn, >>58women);diabetes mellitus     Objective:    Today's Vitals   11/29/21 0834  Weight: 154 lb (69.9 kg)  Height: 5' 5.25" (1.657 m)   Body mass index is 25.43 kg/m.     11/29/2021    8:41 AM 11/16/2020    8:13 AM 01/14/2020    6:59 AM 11/11/2019    8:20 AM 09/29/2018    6:58 AM 02/12/2018    7:59 AM  Advanced Directives  Does Patient Have a Medical Advance Directive? Yes Yes Yes Yes Yes Yes  Type of AParamedicof ADavis CityLiving will [place]iraLiving will  [place]anaLiving will HHavensvilleLiving will HGlenview ManorLiving will  Copy of [place] Chart? No - copy requested No - copy requested  No - copy requested No - copy requested No - copy requested    Current Medications (verified) Outpatient  Encounter Medications as of 11/29/2021  Medication Sig   acetaminophen (TYLENOL) 500 MG tablet Take 1,000 mg by mouth every 6 (six) hours as needed for moderate [name] or headache.   aspirin 81 MG tablet Take 81 mg by mouth daily.     atorvastatin (LIPITOR) 80 MG tablet TAKE 1 TABLET BY MOUTH ONCE DAILY AT 6PM   Cholecalciferol (VITAMIN D3) 125 MCG (5000 UT) CAPS Take 5,000 Units by mouth daily.   clindamycin (CLEOCIN T) 1 % lotion Apply 1 application topically daily.   Cranberry-Vitamin C-Vitamin E (CRANBERRY PLUS VITAMIN C) 4200-20-3 MG-MG-UNIT CAPS Take 2 capsules by mouth daily.   escitalopram (LEXAPRO) 10 MG tablet Take 10 mg by mouth daily.   ezetimibe (ZETIA) 10 MG tablet TAKE 1 TABLET BY MOUTH EVERY DAY   Ferrous Sulfate (IRON) 325 (65 Fe) MG TABS Take 650 mg by mouth daily.    INVOKANA 300 MG TABS tablet TAKE 1 TABLET BY MOUTH EVERY DAY BEFORE breakfast   Lancet Devices (ONE TOUCH DELICA LANCING DEV) MISC Use as directed to check blood sugars twice to four time per day. Dx E11.9   Lancets (ONETOUCH DELICA PLUS LUXNATF57D MISC USE TO check blood sugar 2-3 TIMES A DAY   levothyroxine (SYNTHROID) 50 MCG tablet TAKE 1 TABLET BY MOUTH EVERY DAY   LORazepam (ATIVAN) 0.5 MG tablet Take 1-2 tabs    ( 0.5-1 mg) pre procedure   metFORMIN (GLUCOPHAGE-XR) 500 MG 24 hr tablet TAKE 2 TABLETS BY MOUTH TWICE DAILY WITH MEALS  Multiple Vitamins-Minerals (MULTIVITAMIN PO) Take 1 tablet by mouth daily.    Omega-3 Fatty Acids (FISH OIL) 1000 MG CAPS Take 2,000 mg by mouth daily.   omeprazole (PRILOSEC) 40 MG capsule TAKE 1 CAPSULE BY MOUTH 2 TIMES DAILY   ONETOUCH ULTRA test strip USE TO check blood sugar 2-3 TIMES A DAY   PFIZER-BIONT COVID-19 VAC-TRIS SUSP injection    Semaglutide, 2 MG/DOSE, 8 MG/3ML SOPN Inject 2 mg as directed once a week.   tretinoin (RETIN-A) 0.025 % cream Apply topically.   vitamin B-12 (CYANOCOBALAMIN) 1000 MCG tablet Take 2,000 mcg by mouth daily.   No facility-administered  encounter medications on file as of 11/29/2021.    Allergies (verified) Penicillins   History: Past Medical History:  Diagnosis Date   Anemia    as a child    CAD, NATIVE VESSEL 05/13/2009   Cataracts, bilateral    MD just watching   Complication of anesthesia    DIABETES MELLITUS, TYPE II 12/09/2009   Gastric ulcer    Gastritis    GERD (gastroesophageal reflux disease)    Hx of abnormal cervical Pap smear    cryo  but normal since then    HYPERLIPIDEMIA-MIXED 04/14/2008   Intestinal metaplasia of gastric mucosa 2012   Myocardial infarction ([place])    2003   Obesity    Occult blood in stools    Osteopenia    OVERWEIGHT/OBESITY 05/13/2009   Ulcer    Duodenal ulcer by x-ray 40 years ago   Varicose veins    under rx [place] 2016   Past Surgical History:  Procedure Laterality Date   BIOPSY  09/29/2018   Procedure: BIOPSY;  Surgeon: Irving Copas., MD;  Location: [facility];  Service: Gastroenterology;;   BIOPSY  01/14/2020   Procedure: BIOPSY;  Surgeon: Irving Copas., MD;  Location: [facility];  Service: Gastroenterology;;   CESAREAN SECTION     x2   CLOSED REDUCTION PROXIMAL TIBIOFIBULAR JOINT [place]  2004   left   COLONOSCOPY     COLONOSCOPY WITH PROPOFOL N/A 09/29/2018   Procedure: COLONOSCOPY WITH PROPOFOL;  Surgeon: Irving Copas., MD;  Location: [facility];  Service: Gastroenterology;  Laterality: N/A;   DILATION AND CURETTAGE OF UTERUS     ECTOPIC PREGNANCY SURGERY     ESOPHAGOGASTRODUODENOSCOPY N/A 02/12/2018   Procedure: ESOPHAGOGASTRODUODENOSCOPY (EGD);  Surgeon: Irving Copas., MD;  Location: [facility];  Service: Gastroenterology;  Laterality: N/A;   ESOPHAGOGASTRODUODENOSCOPY N/A 09/29/2018   Procedure: ESOPHAGOGASTRODUODENOSCOPY (EGD);  Surgeon: Irving Copas., MD;  Location: [facility];  Service: Gastroenterology;  Laterality: N/A;   ESOPHAGOGASTRODUODENOSCOPY (EGD) WITH PROPOFOL N/A 01/14/2020   Procedure:  ESOPHAGOGASTRODUODENOSCOPY (EGD) WITH PROPOFOL;  Surgeon: Rush Landmark Telford Nab., MD;  Location: [facility];  Service: Gastroenterology;  Laterality: N/A;   EUS N/A 02/12/2018   Procedure: UPPER ENDOSCOPIC ULTRASOUND (EUS) RADIAL;  Surgeon: Rush Landmark Telford Nab., MD;  Location: [facility];  Service: Gastroenterology;  Laterality: N/A;   FIBULA FRACTURE SURGERY     GANGLION CYST EXCISION     POLYPECTOMY  02/12/2018   Procedure: POLYPECTOMY;  Surgeon: Rush Landmark Telford Nab., MD;  Location: [facility];  Service: Gastroenterology;;   POLYPECTOMY  01/14/2020   Procedure: POLYPECTOMY;  Surgeon: Irving Copas., MD;  Location: [facility];  Service: Gastroenterology;;   TONSILLECTOMY     TUBAL LIGATION     One Tube   UPPER GASTROINTESTINAL ENDOSCOPY     08/27/17   Family History  Problem Relation Age of Onset  Emphysema Mother        Smoker   Heart attack Mother        due to head injury   Alcohol abuse Mother    Heart disease Mother    Early death Mother    Varicose Veins Mother    AAA (abdominal aortic aneurysm) Mother    Lung cancer Father    Heart attack Father    Hypertension Father    Alcohol abuse Father    Heart disease Father        before age 54   Aneurysm Father    Hyperlipidemia Father    Alcohol abuse Brother    Diabetes Brother    Heart disease Brother    Heart attack Brother    Diabetes Brother    Aneurysm Paternal Grandmother    Diabetes Other        grandfather   Sudden Cardiac Death Brother    Colon cancer Neg Hx    Esophageal cancer Neg Hx    Stomach cancer Neg Hx    Inflammatory bowel disease Neg Hx    Liver disease Neg Hx    Pancreatic cancer Neg Hx    Rectal cancer Neg Hx    Social History   Socioeconomic History   Marital status: Widowed    Spouse name: Not on file   Number of children: 2   Years of education: phd    Highest education level: Not on file  Occupational History   Occupation: Mental Health Counselor    Comment:  [employer]   Occupation: Nurse, adult: [employer]  Tobacco Use   Smoking status: Former    Types: Cigarettes    Quit date: 02/05/1994    Years since quitting: 27.8   Smokeless tobacco: Never  Vaping Use   Vaping Use: Never used  Substance and Sexual Activity   Alcohol use: No    Alcohol/week: 0.0 standard drinks of alcohol   Drug use: No   Sexual activity: Not on file  Other Topics Concern   Not on file  Social History Narrative   HH of 2 currently    No tobacco    Separated     Recently widowed   5 14       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    No firearms    NO reg exercise    Pets- Poodles 2    Sleep 7 hours    Social Determinants of Health   Financial Resource Strain: Low Risk  (11/29/2021)   Overall Financial Resource Strain (CARDIA)    Difficulty of Paying Living Expenses: Not hard at all  Food Insecurity: No Food Insecurity (11/29/2021)   Hunger Vital Sign    Worried About Running Out of Food in the Last Year: Never true    Ran Out of Food in the Last Year: Never true  Transportation Needs: No Transportation Needs (11/29/2021)   PRAPARE - Hydrologist (Medical): No    Lack of Transportation (Non-Medical): No  Physical Activity: Insufficiently Active (11/29/2021)   Exercise Vital Sign    Days of Exercise per Week: 2 days    Minutes of Exercise per Session: 30 min  Stress: No Stress Concern Present (11/29/2021)   [place]    Feeling of  Stress : Not at all  Social Connections: Moderately Integrated (11/29/2021)   Social Connection and Isolation Panel [NHANES]    Frequency of Communication with Friends and Family: More than three times a week    Frequency of Social Gatherings with Friends and Family: More than three times a week    Attends  Religious Services: More than 4 times per year    Active Member of [employer] or Organizations: Yes    Attends Archivist Meetings: More than 4 times per year    Marital Status: Widowed    Tobacco Counseling Counseling given: Not Answered   Clinical Intake:  Pre-visit preparation completed: No Nutrition Risk Assessment:  Has the patient had any N/V/D within the last 2 months?  No  Does the patient have any non-healing wounds?  No  Has the patient had any unintentional weight loss or weight gain?  No   Diabetes:  Is the patient diabetic?  Yes  If diabetic, was a CBG obtained today?  Yes CBG 152 Taken by patient Did the patient bring in their glucometer from home?  No  How often do you monitor your CBG's? Daily.   Financial Strains and Diabetes Management:  Are you having any financial strains with the device, your supplies or your medication? No .  Does the patient want to be seen by Chronic Care Management for management of their diabetes?  No  Would the patient like to be referred to a Nutritionist or for Diabetic Management?  No   Diabetic Exams:  Diabetic Eye Exam: Completed No. Overdue for diabetic eye exam. Pt has been advised about the importance in completing this exam. A referral has been placed today. Message sent to referral coordinator for scheduling purposes. Advised pt to expect a call from office referred to regarding appt.  Diabetic Foot Exam: Completed No. Pt has been advised about the importance in completing this exam. Pt is scheduled for diabetic foot exam on Followed by PCP.   [name] : No/denies [name]     BMI - recorded: 25.43 Nutritional Status: BMI 25 -29 Overweight Nutritional Risks: None Diabetes: No  How often do you need to have someone help you when you read instructions, pamphlets, or other written materials from your doctor or pharmacy?: 1 - Never  Diabetic?  Yes  Interpreter Needed?: No  Information entered by :: Rolene Arbour  LPN   Activities of Daily Living    11/29/2021    8:40 AM  In your present state of health, do you have any difficulty performing the following activities:  Hearing? 0  Vision? 0  Difficulty concentrating or making decisions? 0  Walking or climbing stairs? 0  Dressing or bathing? 0  Doing errands, shopping? 0  Preparing Food and eating ? N  Using the Toilet? N  In the past six months, have you accidently leaked urine? N  Do you have problems with loss of bowel control? N  Managing your Medications? N  Managing your Finances? N  Housekeeping or managing your Housekeeping? N    Patient Care Team: Panosh, Standley Brooking, MD as PCP - General (Internal Medicine) Martinique, Amy, MD as Consulting Physician (Dermatology) Dian Queen, MD as Attending Physician (Obstetrics and Gynecology) Stanford Breed Denice Bors, MD as Consulting Physician (Cardiology) Mauri Pole, MD as Consulting Physician (Gastroenterology) Geanie Kenning, MD (Psychiatry)  Indicate any recent Medical Services you may have received from other than [facility] providers in the past year (date may be approximate).  Assessment:   This is a routine wellness examination for [facility].  Hearing/Vision screen Hearing Screening - Comments:: Denies hearing difficulties   Vision Screening - Comments:: Wears rx glasses - up to date with routine eye exams with  Dr Ellie Lunch  Dietary issues and exercise activities discussed: Current Exercise Habits: Home exercise routine, Type of exercise: walking, Time (Minutes): 30, Frequency (Times/Week): 2, Weekly Exercise (Minutes/Week): 60, Intensity: Moderate, Exercise limited by: None identified   Goals Addressed               This Visit's Progress     Patient Stated (pt-stated)        Stay Healthy.        Depression Screen    11/29/2021    8:38 AM 08/14/2021    9:08 AM 01/23/2021    8:46 AM 11/16/2020    8:11 AM 08/01/2020   11:43 AM 11/11/2019    8:15 AM 07/24/2019    8:28  AM  PHQ 2/9 Scores  PHQ - 2 Score 0 0 0 0 0 1 0  PHQ- 9 Score  0 2  2  0    Fall Risk    11/29/2021    8:41 AM 08/14/2021    9:08 AM 01/23/2021    8:46 AM 11/16/2020    8:14 AM 11/11/2019    8:21 AM  [place] in the past year? 0 0 0 0 0  Number falls in past yr: 0 0 1 0 0  Injury with Fall? 0 0  0 0  Risk for fall due to : No Fall Risks No Fall Risks  Impaired vision Impaired vision  Follow up Falls prevention discussed Falls prevention discussed  Falls prevention discussed Falls prevention discussed    FALL RISK PREVENTION PERTAINING TO THE HOME:  Any stairs in or around the home? Yes  If so, are there any without handrails? No  Home free of loose throw rugs in walkways, pet beds, electrical cords, etc? Yes  Adequate lighting in your home to reduce risk of falls? Yes   ASSISTIVE DEVICES UTILIZED TO PREVENT FALLS:  Life alert? No  Use of a cane, walker or w/c? No  Grab bars in the bathroom? No  Shower chair or bench in shower? Yes  Elevated toilet seat or a handicapped toilet? No   TIMED UP AND GO:  Was the test performed? No . Audio Visit   Cognitive Function:        11/29/2021    8:41 AM 11/16/2020    8:16 AM 11/11/2019    8:24 AM  6CIT Screen  What Year? 0 points 0 points 0 points  What month? 0 points 0 points 0 points  What time? 0 points 0 points   Count back from 20 0 points 0 points 0 points  Months in reverse 0 points 0 points 0 points  Repeat phrase 0 points 0 points 0 points  Total Score 0 points 0 points     Immunizations Immunization History  Administered Date(s) Administered   Fluad Quad(high Dose 65+) 10/03/2018, 11/27/2019, 11/15/2020   Hep A / Hep B 04/14/2010, 05/19/2010, 10/27/2010   Influenza Split 10/27/2010   Influenza, High Dose Seasonal PF 12/20/2016   Influenza,inj,Quad PF,6+ Mos 12/12/2015   PFIZER(Purple Top)SARS-COV-2 Vaccination 03/14/2019, 04/06/2019, 11/01/2019   Pneumococcal Conjugate-13 07/09/2016    Pneumococcal Polysaccharide-23 10/27/2004, 04/14/2010, 07/24/2019   Td 07/08/2004   Tdap 09/24/2014    TDAP status: Up to date  Flu Vaccine  status: Up to date  Pneumococcal vaccine status: Up to date  Covid-19 vaccine status: Completed vaccines  Qualifies for Shingles Vaccine? Yes   Zostavax completed No   Shingrix Completed?: No.    Education has been provided regarding the importance of this vaccine. Patient has been advised to call insurance company to determine out of pocket expense if they have not yet received this vaccine. Advised may also receive vaccine at local pharmacy or Health Dept. Verbalized acceptance and understanding.  Screening Tests Health Maintenance  Topic Date Due   OPHTHALMOLOGY EXAM  11/29/2021 (Originally 09/21/2021)   FOOT EXAM  11/30/2021 (Originally 08/01/2021)   COLONOSCOPY (Pts 45-15yr Insurance coverage will need to be confirmed)  01/23/2022 (Originally 09/29/2019)   COVID-19 Vaccine (5 - Pfizer risk series) 02/14/2022 (Originally 12/27/2019)   Zoster Vaccines- Shingrix (1 of 2) 02/14/2022 (Originally 04/02/1969)   INFLUENZA VACCINE  05/06/2022 (Originally 09/05/2021)   HEMOGLOBIN A1C  02/14/2022   MAMMOGRAM  03/07/2022   Diabetic kidney evaluation - GFR measurement  08/15/2022   Diabetic kidney evaluation - Urine ACR  08/15/2022   Medicare Annual Wellness (AWV)  12/30/2022   TETANUS/TDAP  09/23/2024   Pneumonia Vaccine 70 Years old  Completed   DEXA SCAN  Completed   Hepatitis C Screening  Completed   HPV VACCINES  Aged Out    Health Maintenance  There are no preventive care reminders to display for this patient.   Colorectal cancer screening: Type of screening: Colonoscopy. Completed 09/29/18. Repeat every 1 years  Mammogram status: Completed 03/07/20. Repeat every year  Bone Density status: Completed 09/17/14. Results reflect: Bone density results: OSTEOPOROSIS. Repeat every   years.  Lung Cancer Screening: (Low Dose CT Chest recommended if  Age 312-80years, 30 pack-year currently smoking OR have quit w/in 15years.) does not qualify.     Additional Screening:  Hepatitis C Screening: does qualify; Completed 06/20/12  Vision Screening: Recommended annual ophthalmology exams for early detection of glaucoma and other disorders of the eye. Is the patient up to date with their annual eye exam?  Yes  Who is the provider or what is the name of the office in which the patient attends annual eye exams? Dr MEllie LunchIf pt is not established with a provider, would they like to be referred to a provider to establish care? No .   Dental Screening: Recommended annual dental exams for proper oral hygiene  Community Resource Referral / Chronic Care Management:  CRR required this visit?  No   CCM required this visit?  No      Plan:     I have personally reviewed and noted the following in the patient's chart:   Medical and social history Use of alcohol, tobacco or illicit drugs  Current medications and supplements including opioid prescriptions. Patient is not currently taking opioid prescriptions. Functional ability and status Nutritional status Physical activity Advanced directives List of other physicians Hospitalizations, surgeries, and ER visits in previous 12 months Vitals Screenings to include cognitive, depression, and falls Referrals and appointments  In addition, I have reviewed and discussed with patient certain preventive protocols, quality metrics, and best practice recommendations. A written personalized care plan for preventive services as well as general preventive health recommendations were provided to patient.     BCriselda Peaches LPN   [id number]  Nurse Notes: None

## 2021-11-30 ENCOUNTER — Other Ambulatory Visit: Payer: Self-pay | Admitting: Internal Medicine

## 2021-12-04 ENCOUNTER — Telehealth: Payer: Self-pay | Admitting: *Deleted

## 2021-12-04 NOTE — Patient Outreach (Signed)
  Care Coordination   12/04/2021 Name: Morgan [name] MRN: [id number] DOB: 01-Oct-1950   Care Coordination Outreach Attempts:  An unsuccessful telephone outreach was attempted today to offer the patient information about available care coordination services as a benefit of their health plan.   Follow Up Plan:  Additional outreach attempts will be made to offer the patient care coordination information and services.   Encounter Outcome:  No Answer  Care Coordination Interventions Activated:  No   Care Coordination Interventions:  No, not indicated    Raina Mina, RN Care Management Coordinator [place] Office [phone]

## 2021-12-05 ENCOUNTER — Other Ambulatory Visit: Payer: Self-pay | Admitting: Cardiology

## 2021-12-07 ENCOUNTER — Encounter: Payer: Self-pay | Admitting: Cardiology

## 2021-12-07 ENCOUNTER — Ambulatory Visit: Payer: PPO | Attending: Cardiology | Admitting: Cardiology

## 2021-12-07 VITALS — BP 112/64 | HR 68 | Ht 65.0 in | Wt 156.2 lb

## 2021-12-07 DIAGNOSIS — E7800 Pure hypercholesterolemia, unspecified: Secondary | ICD-10-CM | POA: Diagnosis not present

## 2021-12-07 DIAGNOSIS — I2510 Atherosclerotic heart disease of native coronary artery without angina pectoris: Secondary | ICD-10-CM | POA: Diagnosis not present

## 2021-12-07 NOTE — Patient Instructions (Signed)
    Follow-Up: At [facility], you and your health needs are our priority.  As part of our continuing mission to provide you with exceptional heart care, we have created designated Provider Care Teams.  These Care Teams include your primary Cardiologist (physician) and Advanced Practice Providers (APPs -  Physician Assistants and Nurse Practitioners) who all work together to provide you with the care you need, when you need it.  We recommend signing up for the patient portal called "MyChart".  Sign up information is provided on this After Visit Summary.  MyChart is used to connect with patients for Virtual Visits (Telemedicine).  Patients are able to view lab/test results, encounter notes, upcoming appointments, etc.  Non-urgent messages can be sent to your provider as well.   To learn more about what you can do with MyChart, go to [email or web address].    Your next appointment:   12 month(s)  The format for your next appointment:   In Person  Provider:   Brian Crenshaw MD          

## 2021-12-07 NOTE — Addendum Note (Signed)
Addended by: Cristopher Estimable on: 12/07/2021 11:31 AM   Modules accepted: Orders

## 2021-12-18 ENCOUNTER — Other Ambulatory Visit (HOSPITAL_BASED_OUTPATIENT_CLINIC_OR_DEPARTMENT_OTHER): Payer: Self-pay

## 2022-01-01 DIAGNOSIS — F331 Major depressive disorder, recurrent, moderate: Secondary | ICD-10-CM | POA: Diagnosis not present

## 2022-01-18 ENCOUNTER — Other Ambulatory Visit (HOSPITAL_BASED_OUTPATIENT_CLINIC_OR_DEPARTMENT_OTHER): Payer: Self-pay

## 2022-01-31 ENCOUNTER — Telehealth: Payer: PPO | Admitting: Physician Assistant

## 2022-01-31 DIAGNOSIS — B9789 Other viral agents as the cause of diseases classified elsewhere: Secondary | ICD-10-CM

## 2022-01-31 DIAGNOSIS — J019 Acute sinusitis, unspecified: Secondary | ICD-10-CM

## 2022-01-31 MED ORDER — FLUTICASONE PROPIONATE 50 MCG/ACT NA SUSP: 2.0000 | Freq: Every day | NASAL | 0 refills | Status: DC

## 2022-01-31 NOTE — Progress Notes (Signed)
I have spent 5 minutes in review of e-visit questionnaire, review and updating patient chart, medical decision making and response to patient.   Morgan Horrigan Cody Yanisa Goodgame, PA-C    

## 2022-01-31 NOTE — Progress Notes (Signed)

## 2022-02-04 ENCOUNTER — Telehealth: Payer: PPO | Admitting: Family

## 2022-02-04 DIAGNOSIS — J019 Acute sinusitis, unspecified: Secondary | ICD-10-CM

## 2022-02-04 MED ORDER — DOXYCYCLINE HYCLATE 100 MG PO TABS: 100.0000 mg | ORAL_TABLET | Freq: Two times a day (BID) | ORAL | 0 refills | Status: DC

## 2022-02-04 NOTE — Progress Notes (Signed)

## 2022-02-15 ENCOUNTER — Encounter: Payer: Self-pay | Admitting: Internal Medicine

## 2022-02-19 ENCOUNTER — Other Ambulatory Visit: Payer: Self-pay | Admitting: Cardiology

## 2022-02-20 ENCOUNTER — Other Ambulatory Visit: Payer: Self-pay | Admitting: Internal Medicine

## 2022-02-20 ENCOUNTER — Encounter: Payer: Self-pay | Admitting: Internal Medicine

## 2022-02-20 ENCOUNTER — Ambulatory Visit (INDEPENDENT_AMBULATORY_CARE_PROVIDER_SITE_OTHER): Payer: PPO | Admitting: Internal Medicine

## 2022-02-20 VITALS — BP 120/70 | HR 68 | Temp 97.8°F | Ht 65.0 in | Wt 156.4 lb

## 2022-02-20 DIAGNOSIS — E119 Type 2 diabetes mellitus without complications: Secondary | ICD-10-CM

## 2022-02-20 DIAGNOSIS — E782 Mixed hyperlipidemia: Secondary | ICD-10-CM | POA: Diagnosis not present

## 2022-02-20 LAB — POCT GLYCOSYLATED HEMOGLOBIN (HGB A1C): Hemoglobin A1C: 8.3 % — AB (ref 4.0–5.6)

## 2022-02-20 NOTE — Progress Notes (Signed)
Established Patient Office Visit     CC/Reason for Visit: Issues with Ozempic  HPI: Morgan [name] is a 72 y.o. female who is coming in today for the above mentioned reasons. Past Medical History is significant for: Type 2 diabetes and hyperlipidemia among other things.  She has been a diabetic for many years.  She is currently on Ozempic 2 mg, Invokana 300 mg and metformin at 1000 mg twice daily.  She has tried Byetta, Trulicity, Rybelsus and Victoza in the past without much success.  She received a letter from [employer] that they would no longer be covering semaglutide and she would like to know what happens next.   Past Medical/Surgical History: Past Medical History:  Diagnosis Date   Anemia    as a child    CAD, NATIVE VESSEL 05/13/2009   Cataracts, bilateral    MD just watching   Complication of anesthesia    DIABETES MELLITUS, TYPE II 12/09/2009   Gastric ulcer    Gastritis    GERD (gastroesophageal reflux disease)    Hx of abnormal cervical Pap smear    cryo  but normal since then    HYPERLIPIDEMIA-MIXED 04/14/2008   Intestinal metaplasia of gastric mucosa 2012   Myocardial infarction ([place])    2003   Obesity    Occult blood in stools    Osteopenia    OVERWEIGHT/OBESITY 05/13/2009   Ulcer    Duodenal ulcer by x-ray 40 years ago   Varicose veins    under rx [facility] 2016    Past Surgical History:  Procedure Laterality Date   BIOPSY  09/29/2018   Procedure: BIOPSY;  Surgeon: Irving Copas., MD;  Location: [place];  Service: Gastroenterology;;   BIOPSY  01/14/2020   Procedure: BIOPSY;  Surgeon: Irving Copas., MD;  Location: [place];  Service: Gastroenterology;;   CESAREAN SECTION     x2   CLOSED REDUCTION PROXIMAL TIBIOFIBULAR JOINT [place]  2004   left   COLONOSCOPY     COLONOSCOPY WITH PROPOFOL N/A 09/29/2018   Procedure: COLONOSCOPY WITH PROPOFOL;  Surgeon: Irving Copas., MD;  Location: [place];   Service: Gastroenterology;  Laterality: N/A;   DILATION AND CURETTAGE OF UTERUS     ECTOPIC PREGNANCY SURGERY     ESOPHAGOGASTRODUODENOSCOPY N/A 02/12/2018   Procedure: ESOPHAGOGASTRODUODENOSCOPY (EGD);  Surgeon: Irving Copas., MD;  Location: [facility];  Service: Gastroenterology;  Laterality: N/A;   ESOPHAGOGASTRODUODENOSCOPY N/A 09/29/2018   Procedure: ESOPHAGOGASTRODUODENOSCOPY (EGD);  Surgeon: Irving Copas., MD;  Location: [place];  Service: Gastroenterology;  Laterality: N/A;   ESOPHAGOGASTRODUODENOSCOPY (EGD) WITH PROPOFOL N/A 01/14/2020   Procedure: ESOPHAGOGASTRODUODENOSCOPY (EGD) WITH PROPOFOL;  Surgeon: Rush Landmark Telford Nab., MD;  Location: [place];  Service: Gastroenterology;  Laterality: N/A;   EUS N/A 02/12/2018   Procedure: UPPER ENDOSCOPIC ULTRASOUND (EUS) RADIAL;  Surgeon: Rush Landmark Telford Nab., MD;  Location: [facility];  Service: Gastroenterology;  Laterality: N/A;   FIBULA FRACTURE SURGERY     GANGLION CYST EXCISION     POLYPECTOMY  02/12/2018   Procedure: POLYPECTOMY;  Surgeon: Mansouraty, Telford Nab., MD;  Location: [facility];  Service: Gastroenterology;;   POLYPECTOMY  01/14/2020   Procedure: POLYPECTOMY;  Surgeon: Irving Copas., MD;  Location: [place];  Service: Gastroenterology;;   TONSILLECTOMY     TUBAL LIGATION     One Tube   UPPER GASTROINTESTINAL ENDOSCOPY     08/27/17    Social History:  reports that she quit smoking  about 28 years ago. Her smoking use included cigarettes. She has never used smokeless tobacco. She reports that she does not drink alcohol and does not use drugs.  Allergies: Allergies  Allergen Reactions   Penicillins Rash    Did it involve swelling of the face/tongue/throat, SOB, or low BP? No Did it involve sudden or severe rash/hives, skin peeling, or any reaction on the inside of your mouth or nose? No Did you need to seek medical attention at a hospital or doctor's office? No When did it  last happen?      childhood allergy If all above answers are "NO", may proceed with cephalosporin use.     Family History:  Family History  Problem Relation Age of Onset   Emphysema Mother        Smoker   Heart attack Mother        due to head injury   Alcohol abuse Mother    Heart disease Mother    Early death Mother    Varicose Veins Mother    AAA (abdominal aortic aneurysm) Mother    Lung cancer Father    Heart attack Father    Hypertension Father    Alcohol abuse Father    Heart disease Father        before age 11   Aneurysm Father    Hyperlipidemia Father    Alcohol abuse Brother    Diabetes Brother    Heart disease Brother    Heart attack Brother    Diabetes Brother    Aneurysm Paternal Grandmother    Diabetes Other        grandfather   Sudden Cardiac Death Brother    Colon cancer Neg Hx    Esophageal cancer Neg Hx    Stomach cancer Neg Hx    Inflammatory bowel disease Neg Hx    Liver disease Neg Hx    Pancreatic cancer Neg Hx    Rectal cancer Neg Hx      Current Outpatient Medications:    acetaminophen (TYLENOL) 500 MG tablet, Take 1,000 mg by mouth every 6 (six) hours as needed for moderate pain or headache., Disp: , Rfl:    aspirin 81 MG tablet, Take 81 mg by mouth daily.  , Disp: , Rfl:    atorvastatin (LIPITOR) 80 MG tablet, TAKE 1 TABLET BY MOUTH ONCE DAILY AT 6PM, Disp: 90 tablet, Rfl: 1   Cholecalciferol (VITAMIN D3) 125 MCG (5000 UT) CAPS, Take 5,000 Units by mouth daily., Disp: , Rfl:    clindamycin (CLEOCIN T) 1 % lotion, Apply 1 application  topically daily., Disp: , Rfl:    Cranberry-Vitamin C-Vitamin E (CRANBERRY PLUS VITAMIN C) 4200-20-3 MG-MG-UNIT CAPS, Take 2 capsules by mouth daily., Disp: , Rfl:    doxycycline (VIBRA-TABS) 100 MG tablet, Take 1 tablet (100 mg total) by mouth 2 (two) times daily., Disp: 20 tablet, Rfl: 0   escitalopram (LEXAPRO) 10 MG tablet, Take 10 mg by mouth daily., Disp: , Rfl:    ezetimibe (ZETIA) 10 MG tablet, TAKE 1  TABLET BY MOUTH EVERY DAY, Disp: 90 tablet, Rfl: 1   Ferrous Sulfate (IRON) 325 (65 Fe) MG TABS, Take 650 mg by mouth daily. , Disp: , Rfl:    fluticasone (FLONASE) 50 MCG/ACT nasal spray, Place 2 sprays into both nostrils daily., Disp: 16 g, Rfl: 0   INVOKANA 300 MG TABS tablet, TAKE 1 TABLET BY MOUTH EVERY DAY BEFORE breakfast, Disp: 90 tablet, Rfl: 0   Lancet Devices (ONE  TOUCH DELICA LANCING DEV) MISC, Use as directed to check blood sugars twice to four time per day. Dx E11.9, Disp: 1 each, Rfl: 11   Lancets (ONETOUCH DELICA PLUS [id number]) MISC, USE TO check blood sugar 2-3 TIMES A DAY, Disp: 100 each, Rfl: 1   levothyroxine (SYNTHROID) 50 MCG tablet, TAKE 1 TABLET BY MOUTH EVERY DAY, Disp: 90 tablet, Rfl: 1   LORazepam (ATIVAN) 0.5 MG tablet, Take 1-2 tabs    ( 0.5-1 mg) pre procedure, Disp: 8 tablet, Rfl: 0   metFORMIN (GLUCOPHAGE-XR) 500 MG 24 hr tablet, TAKE 2 TABLETS BY MOUTH TWICE DAILY WITH MEALS, Disp: 360 tablet, Rfl: 1   Multiple Vitamins-Minerals (MULTIVITAMIN PO), Take 1 tablet by mouth daily. , Disp: , Rfl:    Omega-3 Fatty Acids (FISH OIL) 1000 MG CAPS, Take 2,000 mg by mouth daily., Disp: , Rfl:    omeprazole (PRILOSEC) 40 MG capsule, TAKE 1 CAPSULE BY MOUTH 2 TIMES DAILY, Disp: 180 capsule, Rfl: 1   ONETOUCH ULTRA test strip, USE TO check blood sugar 2-3 TIMES A DAY, Disp: 100 strip, Rfl: 1   PFIZER-BIONT COVID-19 VAC-TRIS SUSP injection, , Disp: , Rfl:    Semaglutide, 2 MG/DOSE, 8 MG/3ML SOPN, Inject 2 mg as directed once a week., Disp: 3 mL, Rfl: 2   tretinoin (RETIN-A) 0.025 % cream, Apply topically., Disp: , Rfl:    vitamin B-12 (CYANOCOBALAMIN) 1000 MCG tablet, Take 2,000 mcg by mouth daily., Disp: , Rfl:   Review of Systems:  Constitutional: Denies fever, chills, diaphoresis, appetite change and fatigue.  HEENT: Denies photophobia, eye pain, redness, hearing loss, ear pain, congestion, sore throat, rhinorrhea, sneezing, mouth sores, trouble swallowing, neck pain, neck  stiffness and tinnitus.   Respiratory: Denies SOB, DOE, cough, chest tightness,  and wheezing.   Cardiovascular: Denies chest pain, palpitations and leg swelling.  Gastrointestinal: Denies nausea, vomiting, abdominal pain, diarrhea, constipation, blood in stool and abdominal distention.  Genitourinary: Denies dysuria, urgency, frequency, hematuria, flank pain and difficulty urinating.  Endocrine: Denies: hot or cold intolerance, sweats, changes in hair or nails, polyuria, polydipsia. Musculoskeletal: Denies myalgias, back pain, joint swelling, arthralgias and gait problem.  Skin: Denies pallor, rash and wound.  Neurological: Denies dizziness, seizures, syncope, weakness, light-headedness, numbness and headaches.  Hematological: Denies adenopathy. Easy bruising, personal or family bleeding history  Psychiatric/Behavioral: Denies suicidal ideation, mood changes, confusion, nervousness, sleep disturbance and agitation    Physical Exam: Vitals:   02/20/22 0702  BP: 120/70  Pulse: 68  Temp: 97.8 F (36.6 C)  TempSrc: Oral  SpO2: 97%  Weight: 156 lb 7 oz (71 kg)  Height: '5\' 5"'$  (1.651 m)    Body mass index is 26.03 kg/m.   Constitutional: NAD, calm, comfortable Eyes: PERRL, lids and conjunctivae normal, wears corrective lenses ENMT: Mucous membranes are moist.   Psychiatric: Normal judgment and insight. Alert and oriented x 3. Normal mood.    Impression and Plan:  Diabetes mellitus without complication ([place]) - Plan: POCT glycosylated hemoglobin (Hb A1C)  Mixed hyperlipidemia  -She is on maximum dose Ozempic, Invokana and metformin still with an A1c of 8.  3 today. -In addition she has received a letter from [employer] that they will no longer cover semaglutide.  I have advised that she call insurance company to find out next steps.  I am unclear as to whether this needs a prior authorization or a formulary change. -In any case I have suggested referral to  endocrinology as she will probably need  to be started on insulin.  She will call us back with the name of the endocrinologist that a family member sees that she would rather be referred to. -She also has hyperlipidemia her most recent LDL was 71.  She is on atorvastatin 80 mg daily. -Annual wellness visit is up-to-date.  Time spent:31 minutes reviewing chart, interviewing and examining patient and formulating plan of care.     Lelon Frohlich, MD [facility]

## 2022-02-22 ENCOUNTER — Other Ambulatory Visit: Payer: Self-pay | Admitting: Internal Medicine

## 2022-02-27 ENCOUNTER — Other Ambulatory Visit: Payer: Self-pay | Admitting: Internal Medicine

## 2022-02-27 ENCOUNTER — Encounter: Payer: Self-pay | Admitting: Internal Medicine

## 2022-03-13 ENCOUNTER — Other Ambulatory Visit (HOSPITAL_BASED_OUTPATIENT_CLINIC_OR_DEPARTMENT_OTHER): Payer: Self-pay

## 2022-03-13 ENCOUNTER — Other Ambulatory Visit: Payer: Self-pay | Admitting: Internal Medicine

## 2022-03-13 DIAGNOSIS — E1165 Type 2 diabetes mellitus with hyperglycemia: Secondary | ICD-10-CM

## 2022-03-13 MED ORDER — OZEMPIC (2 MG/DOSE) 8 MG/3ML ~~LOC~~ SOPN
2.0000 mg | PEN_INJECTOR | SUBCUTANEOUS | 1 refills | Status: DC
  Filled 2022-03-13: qty 3, 28d supply, fill #0
  Filled 2022-04-07 (×2): qty 3, 28d supply, fill #1

## 2022-04-07 ENCOUNTER — Other Ambulatory Visit: Payer: Self-pay

## 2022-04-07 ENCOUNTER — Other Ambulatory Visit (HOSPITAL_BASED_OUTPATIENT_CLINIC_OR_DEPARTMENT_OTHER): Payer: Self-pay

## 2022-04-11 NOTE — Progress Notes (Signed)
Morgan [name]    UY:[id number]    November 01, 1950  Primary Care Physician:Panosh, Standley Brooking, MD  Referring Physician: Burnis Medin, MD [place],  [place] [address]   Chief complaint:  Chief Complaint  Patient presents with   Colonoscopy    Pt states would like to discuss a colonoscopy and a EGD.     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  Subsequent surveillance EGDs negative for recurrence. She had large adenomatous polyps removed from colon in 2019, surveillance colonoscopy in 2020 was negative.  She was recommended 3-year recall.  I last saw her on 05/31/2020. At that time she was having irregular bowel habits with constipation and diarrhea. She changed her diet, avoided caffeinated drinks with improvement of symptoms. Today, she reports feeling fine. She reports her bowl movements are normal and that she no longer experiences diarrhea. Patient reports being on Ozempic for her diabetes. She reports losing minimal weight on Ozempic as she has been on diabetes medications for a while and therefore it doesn't impact her weight. She reports taking Omeprazole 40 mg 2x a day for her reflux symptoms with adequate control, no breakthrough heartburn or GERD.   She denies any black stool, diarrhea, constipation, nausea, vomiting, bloating, reflux, melena or bright red blood per rectum  She reports having some occasional rib pain on the left side below the bra wire that started 2 weeks ago. She states that her pain resulted from leaning over a metal clipboard. She described her pain as sensitivity.  She reports having had surgery on her left foot.    GI Hx  EGD 01/14/20 - No gross lesions in esophagus proximally. Salmon-colored mucosa tongue noted -biopsied to  rule out Barrett's. - Z-line irregular, 37 cm from the incisors. - 3 cm hiatal hernia. - One gastric polyp - likely hyperplastic or fundic glan. Resected and retrieved. - Scar in the prepyloric to pylorus region. Some protuberance/nodularity within the pyloric channel noted. Region biopsied via cold snare to ensure no evidence of recurrent adenoma. - No other gross lesions in the stomach. - No gross lesions in the duodenal bulb, in the first portion of the duodenum and in the second portion of the duodenum.  A. PRE-PYLORIC EMR SCAR, BIOPSY:  - Gastric antral type mucosa with mild chronic gastritis, reactive  gastropathy and focal intestinal metaplasia.  - Negative for dysplasia.   B. ESOPHAGUS, DISTAL, BIOPSY:  - Squamocolumnar esophageal mucosa with reactive/regenerative changes.  - Negative for intestinal metaplasia (goblet cell metaplasia).   C. STOMACH, POLYPECTOMY:  - Fundic gland polyp.  - Negative for dysplasia.   EGD 09/29/18 - No gross lesions in proximal/middle esophagus. Salmon-colored mucosa suggestive of short-segment Barrett's esophagus. Biopsied. - Small hiatal hernia. - No gross lesions in the stomach. Gastric mapping biopsies performed. - Scar in the prepyloric region of the stomach from previous mucosectomy without gross evidence of residual or recurrent polyp. Biopsied. - No gross lesions in the duodenal bulb, in the first portion of the duodenum and in the second portion of the duodenum.  Colonoscopy 09/29/18 - The examined portion of the ileum was normal. - Normal mucosa in the entire examined colon. - No evidence of recurrent polypoid tissue present on today's examination. - Non-bleeding non-thrombosed internal hemorrhoids.  Colonoscopy August 26, 2017 - One 6 mm polyp in the ascending colon, removed with a cold snare. Resected and retrieved. - One 18 mm polyp in the ascending colon, removed piecemeal using a cold snare. Resected and retrieved. - Moderate  diverticulosis in the sigmoid colon. Peri-diverticular erythema was seen. Biopsied to exclude segmental colitis associated with diverticulosis. - Non-bleeding internal hemorrhoids.    Current Outpatient Medications:    acetaminophen (TYLENOL) 500 MG tablet, Take 1,000 mg by mouth every 6 (six) hours as needed for moderate pain or headache., Disp: , Rfl:    aspirin 81 MG tablet, Take 81 mg by mouth daily.  , Disp: , Rfl:    atorvastatin (LIPITOR) 80 MG tablet, TAKE 1 TABLET BY MOUTH ONCE DAILY AT 6PM, Disp: 90 tablet, Rfl: 0   canagliflozin (INVOKANA) 300 MG TABS tablet, TAKE 1 TABLET BY MOUTH EVERY DAY BEFORE breakfast, Disp: 90 tablet, Rfl: 1   Cholecalciferol (VITAMIN D3) 125 MCG (5000 UT) CAPS, Take 5,000 Units by mouth daily., Disp: , Rfl:    clindamycin (CLEOCIN T) 1 % lotion, Apply 1 application  topically daily., Disp: , Rfl:    Cranberry-Vitamin C-Vitamin E (CRANBERRY PLUS VITAMIN C) 4200-20-3 MG-MG-UNIT CAPS, Take 2 capsules by mouth daily., Disp: , Rfl:    doxycycline (VIBRA-TABS) 100 MG tablet, Take 1 tablet (100 mg total) by mouth 2 (two) times daily., Disp: 20 tablet, Rfl: 0   escitalopram (LEXAPRO) 10 MG tablet, Take 10 mg by mouth daily., Disp: , Rfl:    ezetimibe (ZETIA) 10 MG tablet, TAKE 1 TABLET BY MOUTH EVERY DAY, Disp: 90 tablet, Rfl: 1   Ferrous Sulfate (IRON) 325 (65 Fe) MG TABS, Take 650 mg by mouth daily. , Disp: , Rfl:    fluticasone (FLONASE) 50 MCG/ACT nasal spray, Place 2 sprays into both nostrils daily., Disp: 16 g, Rfl: 0   Lancet Devices (ONE TOUCH DELICA LANCING DEV) MISC, Use as directed to check blood sugars twice to four time per day. Dx E11.9, Disp: 1 each, Rfl: 11   Lancets (ONETOUCH DELICA PLUS 123XX123) MISC, USE TO check blood sugar 2-3 TIMES A DAY, Disp: 100 each, Rfl: 1   levothyroxine (SYNTHROID) 50 MCG tablet, TAKE 1 TABLET BY MOUTH EVERY DAY, Disp: 90 tablet, Rfl: 0   LORazepam (ATIVAN) 0.5 MG tablet, Take 1-2 tabs    ( 0.5-1 mg) pre procedure,  Disp: 8 tablet, Rfl: 0   metFORMIN (GLUCOPHAGE-XR) 500 MG 24 hr tablet, TAKE 2 TABLETS BY MOUTH TWICE DAILY WITH MEALS, Disp: 360 tablet, Rfl: 0   Multiple Vitamins-Minerals (MULTIVITAMIN PO), Take 1 tablet by mouth daily. , Disp: , Rfl:    Omega-3 Fatty Acids (FISH OIL) 1000 MG CAPS, Take 2,000 mg by mouth daily., Disp: , Rfl:    omeprazole (PRILOSEC) 40 MG capsule, TAKE 1 CAPSULE BY MOUTH 2 TIMES DAILY, Disp: 180 capsule, Rfl: 0   ONETOUCH ULTRA test strip, USE TO check blood sugar 2-3 TIMES A DAY, Disp: 100 strip, Rfl: 1   PFIZER-BIONT COVID-19 VAC-TRIS SUSP injection, , Disp: , Rfl:    Semaglutide, 2 MG/DOSE, (OZEMPIC, 2 MG/DOSE,) 8 MG/3ML SOPN, Inject 2 mg as directed once a week., Disp: 3 mL, Rfl: 1   tretinoin (RETIN-A) 0.025 % cream, Apply topically., Disp: , Rfl:    vitamin B-12 (CYANOCOBALAMIN) 1000 MCG tablet, Take 2,000 mcg by mouth daily., Disp: , Rfl:     Allergies as of 04/16/2022 - Review Complete 02/20/2022  Allergen Reaction Noted   Penicillins Rash 02/24/2010  Past Medical History:  Diagnosis Date   Anemia    as a child    CAD, NATIVE VESSEL 05/13/2009   Cataracts, bilateral    MD just watching   Complication of anesthesia    DIABETES MELLITUS, TYPE II 12/09/2009   Gastric ulcer    Gastritis    GERD (gastroesophageal reflux disease)    Hx of abnormal cervical Pap smear    cryo  but normal since then    HYPERLIPIDEMIA-MIXED 04/14/2008   Intestinal metaplasia of gastric mucosa 2012   Myocardial infarction ([place])    2003   Obesity    Occult blood in stools    Osteopenia    OVERWEIGHT/OBESITY 05/13/2009   Ulcer    Duodenal ulcer by x-ray 40 years ago   Varicose veins    under rx [facility] 2016    Past Surgical History:  Procedure Laterality Date   BIOPSY  09/29/2018   Procedure: BIOPSY;  Surgeon: Irving Copas., MD;  Location: [facility];  Service: Gastroenterology;;   BIOPSY  01/14/2020   Procedure: BIOPSY;  Surgeon: Irving Copas., MD;   Location: [facility];  Service: Gastroenterology;;   CESAREAN SECTION     x2   CLOSED REDUCTION PROXIMAL TIBIOFIBULAR JOINT [place]  2004   left   COLONOSCOPY     COLONOSCOPY WITH PROPOFOL N/A 09/29/2018   Procedure: COLONOSCOPY WITH PROPOFOL;  Surgeon: Irving Copas., MD;  Location: [facility];  Service: Gastroenterology;  Laterality: N/A;   DILATION AND CURETTAGE OF UTERUS     ECTOPIC PREGNANCY SURGERY     ESOPHAGOGASTRODUODENOSCOPY N/A 02/12/2018   Procedure: ESOPHAGOGASTRODUODENOSCOPY (EGD);  Surgeon: Irving Copas., MD;  Location: [facility];  Service: Gastroenterology;  Laterality: N/A;   ESOPHAGOGASTRODUODENOSCOPY N/A 09/29/2018   Procedure: ESOPHAGOGASTRODUODENOSCOPY (EGD);  Surgeon: Irving Copas., MD;  Location: [facility];  Service: Gastroenterology;  Laterality: N/A;   ESOPHAGOGASTRODUODENOSCOPY (EGD) WITH PROPOFOL N/A 01/14/2020   Procedure: ESOPHAGOGASTRODUODENOSCOPY (EGD) WITH PROPOFOL;  Surgeon: Rush Landmark Telford Nab., MD;  Location: [facility];  Service: Gastroenterology;  Laterality: N/A;   EUS N/A 02/12/2018   Procedure: UPPER ENDOSCOPIC ULTRASOUND (EUS) RADIAL;  Surgeon: Rush Landmark Telford Nab., MD;  Location: [facility];  Service: Gastroenterology;  Laterality: N/A;   FIBULA FRACTURE SURGERY     GANGLION CYST EXCISION     POLYPECTOMY  02/12/2018   Procedure: POLYPECTOMY;  Surgeon: Mansouraty, Telford Nab., MD;  Location: [facility];  Service: Gastroenterology;;   POLYPECTOMY  01/14/2020   Procedure: POLYPECTOMY;  Surgeon: Irving Copas., MD;  Location: [facility];  Service: Gastroenterology;;   TONSILLECTOMY     TUBAL LIGATION     One Tube   UPPER GASTROINTESTINAL ENDOSCOPY     08/27/17    Family History  Problem Relation Age of Onset   Emphysema Mother        Smoker   Heart attack Mother        due to head injury   Alcohol abuse Mother    Heart disease Mother    Early death Mother    Varicose Veins Mother     AAA (abdominal aortic aneurysm) Mother    Lung cancer Father    Heart attack Father    Hypertension Father    Alcohol abuse Father    Heart disease Father        before age 21   Aneurysm Father    Hyperlipidemia Father    Alcohol abuse Brother    Diabetes Brother    Heart disease  Brother    Heart attack Brother    Diabetes Brother    Aneurysm Paternal Grandmother    Diabetes Other        grandfather   Sudden Cardiac Death Brother    Colon cancer Neg Hx    Esophageal cancer Neg Hx    Stomach cancer Neg Hx    Inflammatory bowel disease Neg Hx    Liver disease Neg Hx    Pancreatic cancer Neg Hx    Rectal cancer Neg Hx     Social History   Socioeconomic History   Marital status: Widowed    Spouse name: Not on file   Number of children: 2   Years of education: phd    Highest education level: Not on file  Occupational History   Occupation: Mental Health Counselor    Comment: [employer]   Occupation: Nurse, adult: [employer]  Tobacco Use   Smoking status: Former    Types: Cigarettes    Quit date: 02/05/1994    Years since quitting: 28.1   Smokeless tobacco: Never  Vaping Use   Vaping Use: Never used  Substance and Sexual Activity   Alcohol use: No    Alcohol/week: 0.0 standard drinks of alcohol   Drug use: No   Sexual activity: Not on file  Other Topics Concern   Not on file  Social History Narrative   HH of 2 currently    No tobacco    Separated     Recently widowed   5 14       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    No firearms    NO reg exercise    Pets- Poodles 2    Sleep 7 hours    Social Determinants of Health   Financial Resource Strain: Low Risk  (11/29/2021)   Overall Financial Resource Strain (CARDIA)    Difficulty of Paying Living Expenses: Not hard at all  Food Insecurity: No Food Insecurity (11/29/2021)   Hunger Vital  Sign    Worried About Running Out of Food in the Last Year: Never true    Ran Out of Food in the Last Year: Never true  Transportation Needs: No Transportation Needs (11/29/2021)   PRAPARE - Hydrologist (Medical): No    Lack of Transportation (Non-Medical): No  Physical Activity: Insufficiently Active (11/29/2021)   Exercise Vital Sign    Days of Exercise per Week: 2 days    Minutes of Exercise per Session: 30 min  Stress: No Stress Concern Present (11/29/2021)   [place]    Feeling of Stress : Not at all  Social Connections: Moderately Integrated (11/29/2021)   Social Connection and Isolation Panel [NHANES]    Frequency of Communication with Friends and Family: More than three times a week    Frequency of Social Gatherings with Friends and Family: More than three times a week    Attends Religious Services: More than 4 times per year    Active Member of [employer] or Organizations: Yes    Attends Archivist Meetings: More than 4 times per year    Marital Status: Widowed  Intimate Partner Violence: Not At Risk (11/29/2021)   Humiliation, Afraid, Rape, and Kick questionnaire    Fear of Current or Ex-Partner: No  Emotionally Abused: No    Physically Abused: No    Sexually Abused: No      Review of systems: Review of Systems  Constitutional:  Negative for unexpected weight change.  HENT:  Negative for trouble swallowing.   Gastrointestinal:  Negative for abdominal distention, abdominal pain, anal bleeding, blood in stool, constipation, diarrhea, nausea, rectal pain and vomiting.  Genitourinary:  Negative for dysuria.  All other systems reviewed and are negative.     Physical Exam: General: well-appearing  Eyes: sclera anicteric, no redness ENT: oral mucosa moist without lesions, no cervical or supraclavicular lymphadenopathy CV: RRR, no JVD, no peripheral edema Resp:  clear to auscultation bilaterally, normal RR and effort noted GI: soft, no tenderness, with active bowel sounds. No guarding or palpable organomegaly noted. Skin; warm and dry, no rash or jaundice noted Neuro: awake, alert and oriented x 3. Normal gross motor function and fluent speech Extremities: mild bilateral ankle edema, left more than right (has left ankle surgery).   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  Recommended repeat EGD 2 to 3 years, will plan for surveillance EGD along with colonoscopy Continue omeprazole 40 mg twice daily Antireflux measures  History of advanced adenomatous colon polyps: Due for surveillance colonoscopy, will schedule it   Left ankle edema > right: History of multiple ankle surgeries and has hardware, patient denies any recent changes.  No shortness of breath or chest pain  Left rib pain likely musculoskeletal, advised patient to monitor and use lidocaine patch as needed.  Follow-up with PMD if symptoms worsen  Iron deficiency anemia: Improved    The patient was provided an opportunity to ask questions and all were answered. The patient agreed with the plan and demonstrated an understanding of the instructions.   I,Safa M Kadhim,acting as a scribe for Harl Bowie, MD.,have documented all relevant documentation on the behalf of Harl Bowie, MD,as directed by  Harl Bowie, MD while in the presence of Harl Bowie, MD.   I, Harl Bowie, MD, have reviewed all documentation for this visit. The documentation on 04/11/22 for the exam, diagnosis, procedures, and orders are all accurate and complete.   Damaris Hippo , MD    CC: Panosh, Standley Brooking, MD

## 2022-04-16 ENCOUNTER — Ambulatory Visit: Payer: PPO | Admitting: Gastroenterology

## 2022-04-16 ENCOUNTER — Encounter: Payer: Self-pay | Admitting: Gastroenterology

## 2022-04-16 VITALS — BP 118/68 | HR 55 | Ht 65.0 in | Wt 156.0 lb

## 2022-04-16 DIAGNOSIS — Z8719 Personal history of other diseases of the digestive system: Secondary | ICD-10-CM | POA: Diagnosis not present

## 2022-04-16 DIAGNOSIS — K31A0 Gastric intestinal metaplasia, unspecified: Secondary | ICD-10-CM

## 2022-04-16 DIAGNOSIS — K219 Gastro-esophageal reflux disease without esophagitis: Secondary | ICD-10-CM

## 2022-04-16 DIAGNOSIS — Z860100 Personal history of colon polyps, unspecified: Secondary | ICD-10-CM

## 2022-04-16 DIAGNOSIS — K3189 Other diseases of stomach and duodenum: Secondary | ICD-10-CM

## 2022-04-16 MED ORDER — NA SULFATE-K SULFATE-MG SULF 17.5-3.13-1.6 GM/177ML PO SOLN: 1.0000 | Freq: Once | ORAL | 0 refills | Status: AC

## 2022-04-16 MED ORDER — NA SULFATE-K SULFATE-MG SULF 17.5-3.13-1.6 GM/177ML PO SOLN: 1.0000 | Freq: Once | ORAL | 0 refills | Status: DC

## 2022-04-16 NOTE — Patient Instructions (Addendum)
You have been scheduled for an endoscopy and colonoscopy. Please follow the written instructions given to you at your visit today. Please pick up your prep supplies at the pharmacy within the next 1-3 days. If you use inhalers (even only as needed), please bring them with you on the day of your procedure.   Continue Omeprazole  Gastroesophageal Reflux Disease, Adult Gastroesophageal reflux (GER) happens when acid from the stomach flows up into the tube that connects the mouth and the stomach (esophagus). Normally, food travels down the esophagus and stays in the stomach to be digested. However, when a person has GER, food and stomach acid sometimes move back up into the esophagus. If this becomes a more serious problem, the person may be diagnosed with a disease called gastroesophageal reflux disease (GERD). GERD occurs when the reflux: Happens often. Causes frequent or severe symptoms. Causes problems such as damage to the esophagus. When stomach acid comes in contact with the esophagus, the acid may cause inflammation in the esophagus. Over time, GERD may create small holes (ulcers) in the lining of the esophagus. What are the causes? This condition is caused by a problem with the muscle between the esophagus and the stomach (lower esophageal sphincter, or LES). Normally, the LES muscle closes after food passes through the esophagus to the stomach. When the LES is weakened or abnormal, it does not close properly, and that allows food and stomach acid to go back up into the esophagus. The LES can be weakened by certain dietary substances, medicines, and medical conditions, including: Tobacco use. Pregnancy. Having a hiatal hernia. Alcohol use. Certain foods and beverages, such as coffee, chocolate, onions, and peppermint. What increases the risk? You are more likely to develop this condition if you: Have an increased body weight. Have a connective tissue disorder. Take NSAIDs, such as  ibuprofen. What are the signs or symptoms? Symptoms of this condition include: Heartburn. Difficult or painful swallowing and the feeling of having a lump in the throat. A bitter taste in the mouth. Bad breath and having a large amount of saliva. Having an upset or bloated stomach and belching. Chest pain. Different conditions can cause chest pain. Make sure you see your health care provider if you experience chest pain. Shortness of breath or wheezing. Ongoing (chronic) cough or a nighttime cough. Wearing away of tooth enamel. Weight loss. How is this diagnosed? This condition may be diagnosed based on a medical history and a physical exam. To determine if you have mild or severe GERD, your health care provider may also monitor how you respond to treatment. You may also have tests, including: A test to examine your stomach and esophagus with a small camera (endoscopy). A test that measures the acidity level in your esophagus. A test that measures how much pressure is on your esophagus. A barium swallow or modified barium swallow test to show the shape, size, and functioning of your esophagus. How is this treated? Treatment for this condition may vary depending on how severe your symptoms are. Your health care provider may recommend: Changes to your diet. Medicine. Surgery. The goal of treatment is to help relieve your symptoms and to prevent complications. Follow these instructions at home: Eating and drinking  Follow a diet as recommended by your health care provider. This may involve avoiding foods and drinks such as: Coffee and tea, with or without caffeine. Drinks that contain alcohol. Energy drinks and sports drinks. Carbonated drinks or sodas. Chocolate and cocoa. Peppermint and mint flavorings.  Garlic and onions. Horseradish. Spicy and acidic foods, including peppers, chili powder, curry powder, vinegar, hot sauces, and barbecue sauce. Citrus fruit juices and citrus  fruits, such as oranges, lemons, and limes. Tomato-based foods, such as red sauce, chili, salsa, and pizza with red sauce. Fried and fatty foods, such as donuts, french fries, potato chips, and high-fat dressings. High-fat meats, such as hot dogs and fatty cuts of red and white meats, such as rib eye steak, sausage, ham, and bacon. High-fat dairy items, such as whole milk, butter, and cream cheese. Eat small, frequent meals instead of large meals. Avoid drinking large amounts of liquid with your meals. Avoid eating meals during the 2-3 hours before bedtime. Avoid lying down right after you eat. Do not exercise right after you eat. Lifestyle  Do not use any products that contain nicotine or tobacco. These products include cigarettes, chewing tobacco, and vaping devices, such as e-cigarettes. If you need help quitting, ask your health care provider. Try to reduce your stress by using methods such as yoga or meditation. If you need help reducing stress, ask your health care provider. If you are overweight, reduce your weight to an amount that is healthy for you. Ask your health care provider for guidance about a safe weight loss goal. General instructions Pay attention to any changes in your symptoms. Take over-the-counter and prescription medicines only as told by your health care provider. Do not take aspirin, ibuprofen, or other NSAIDs unless your health care provider told you to take these medicines. Wear loose-fitting clothing. Do not wear anything tight around your waist that causes pressure on your abdomen. Raise (elevate) the head of your bed about 6 inches (15 cm). You can use a wedge to do this. Avoid bending over if this makes your symptoms worse. Keep all follow-up visits. This is important. Contact a health care provider if: You have: New symptoms. Unexplained weight loss. Difficulty swallowing or it hurts to swallow. Wheezing or a persistent cough. A hoarse voice. Your  symptoms do not improve with treatment. Get help right away if: You have sudden pain in your arms, neck, jaw, teeth, or back. You suddenly feel sweaty, dizzy, or light-headed. You have chest pain or shortness of breath. You vomit and the vomit is green, yellow, or black, or it looks like blood or coffee grounds. You faint. You have stool that is red, bloody, or black. You cannot swallow, drink, or eat. These symptoms may represent a serious problem that is an emergency. Do not wait to see if the symptoms will go away. Get medical help right away. Call your local emergency services (911 in the [place] Do not drive yourself to the hospital. Summary Gastroesophageal reflux happens when acid from the stomach flows up into the esophagus. GERD is a disease in which the reflux happens often, causes frequent or severe symptoms, or causes problems such as damage to the esophagus. Treatment for this condition may vary depending on how severe your symptoms are. Your health care provider may recommend diet and lifestyle changes, medicine, or surgery. Contact a health care provider if you have new or worsening symptoms. Take over-the-counter and prescription medicines only as told by your health care provider. Do not take aspirin, ibuprofen, or other NSAIDs unless your health care provider told you to do so. Keep all follow-up visits as told by your health care provider. This is important.  Earvin Hansen information is not intended to replace advice given to you by your health care provider.  Make sure you discuss any questions you have with your health care provider. Document Revised: 08/03/2019 Document Reviewed: 08/03/2019 Elsevier Patient Education  [place].  .Due to recent changes in healthcare laws, you may see the results of your imaging and laboratory studies on MyChart before your provider has had a chance to review them.  We understand that in some cases there may be results that are  confusing or concerning to you. Not all laboratory results come back in the same time frame and the provider may be waiting for multiple results in order to interpret others.  Please give [place] 48 hours in order for your provider to thoroughly review all the results before contacting the office for clarification of your results.   Thank you for choosing [facility]  Kavitha [name],MD    NO ORAL DIABETIC MEDICATIONS THE AM OF YOUR PROCEDURE  HOLD OZEMPIC  3 DAYS BEFORE PROCEDURE

## 2022-05-10 ENCOUNTER — Encounter: Payer: Self-pay | Admitting: Gastroenterology

## 2022-05-11 ENCOUNTER — Other Ambulatory Visit: Payer: Self-pay | Admitting: Internal Medicine

## 2022-05-11 DIAGNOSIS — E1165 Type 2 diabetes mellitus with hyperglycemia: Secondary | ICD-10-CM

## 2022-05-13 ENCOUNTER — Other Ambulatory Visit (HOSPITAL_BASED_OUTPATIENT_CLINIC_OR_DEPARTMENT_OTHER): Payer: Self-pay

## 2022-05-13 MED ORDER — OZEMPIC (2 MG/DOSE) 8 MG/3ML ~~LOC~~ SOPN
2.0000 mg | PEN_INJECTOR | SUBCUTANEOUS | 1 refills | Status: DC
  Filled 2022-05-13: qty 3, 28d supply, fill #0
  Filled 2022-06-07: qty 3, 28d supply, fill #1

## 2022-05-14 ENCOUNTER — Other Ambulatory Visit (HOSPITAL_BASED_OUTPATIENT_CLINIC_OR_DEPARTMENT_OTHER): Payer: Self-pay

## 2022-05-14 DIAGNOSIS — F331 Major depressive disorder, recurrent, moderate: Secondary | ICD-10-CM | POA: Diagnosis not present

## 2022-05-21 ENCOUNTER — Encounter: Payer: Self-pay | Admitting: Gastroenterology

## 2022-05-21 ENCOUNTER — Ambulatory Visit (AMBULATORY_SURGERY_CENTER): Payer: PPO | Admitting: Gastroenterology

## 2022-05-21 ENCOUNTER — Other Ambulatory Visit: Payer: Self-pay | Admitting: Cardiology

## 2022-05-21 VITALS — BP 115/55 | HR 73 | Temp 97.3°F | Resp 10 | Ht 65.0 in | Wt 156.0 lb

## 2022-05-21 DIAGNOSIS — K2950 Unspecified chronic gastritis without bleeding: Secondary | ICD-10-CM | POA: Diagnosis not present

## 2022-05-21 DIAGNOSIS — K317 Polyp of stomach and duodenum: Secondary | ICD-10-CM | POA: Diagnosis not present

## 2022-05-21 DIAGNOSIS — I252 Old myocardial infarction: Secondary | ICD-10-CM | POA: Diagnosis not present

## 2022-05-21 DIAGNOSIS — K229 Disease of esophagus, unspecified: Secondary | ICD-10-CM | POA: Diagnosis not present

## 2022-05-21 DIAGNOSIS — K219 Gastro-esophageal reflux disease without esophagitis: Secondary | ICD-10-CM

## 2022-05-21 DIAGNOSIS — Z86010 Personal history of colonic polyps: Secondary | ICD-10-CM | POA: Diagnosis not present

## 2022-05-21 DIAGNOSIS — E119 Type 2 diabetes mellitus without complications: Secondary | ICD-10-CM | POA: Diagnosis not present

## 2022-05-21 DIAGNOSIS — E669 Obesity, unspecified: Secondary | ICD-10-CM | POA: Diagnosis not present

## 2022-05-21 DIAGNOSIS — I2510 Atherosclerotic heart disease of native coronary artery without angina pectoris: Secondary | ICD-10-CM | POA: Diagnosis not present

## 2022-05-21 DIAGNOSIS — Z09 Encounter for follow-up examination after completed treatment for conditions other than malignant neoplasm: Secondary | ICD-10-CM | POA: Diagnosis not present

## 2022-05-21 MED ORDER — SODIUM CHLORIDE 0.9 % IV SOLN: 500.0000 mL | Freq: Once | INTRAVENOUS | Status: DC

## 2022-05-21 NOTE — Progress Notes (Signed)
[facility] History and Physical   Primary Care Physician:  Madelin Headings, MD   Reason for Procedure:  Gastric nodule with intestinal metaplasia, History of adenomatous colon polyps  Plan:    Surveillance EGD and colonoscopy with possible interventions as needed     HPI: Morgan [name] is a very pleasant 71 y.o. female here for surveillance EGD and colonoscopy. Please refer to office visit note 04/16/22 for additional details   The risks and benefits as well as alternatives of endoscopic procedure(s) have been discussed and reviewed. All questions answered. The patient agrees to proceed.    Past Medical History:  Diagnosis Date   Anemia    as a child    CAD, NATIVE VESSEL 05/13/2009   Cataracts, bilateral    MD just watching   Complication of anesthesia    DIABETES MELLITUS, TYPE II 12/09/2009   Gastric ulcer    Gastritis    GERD (gastroesophageal reflux disease)    Hx of abnormal cervical Pap smear    cryo  but normal since then    HYPERLIPIDEMIA-MIXED 04/14/2008   Intestinal metaplasia of gastric mucosa 2012   Myocardial infarction    2003   Obesity    Occult blood in stools    Osteopenia    OVERWEIGHT/OBESITY 05/13/2009   Thyroid disease    Ulcer    Duodenal ulcer by x-ray 40 years ago   Varicose veins    under rx lawson 2016    Past Surgical History:  Procedure Laterality Date   BIOPSY  09/29/2018   Procedure: BIOPSY;  Surgeon: Lemar Lofty., MD;  Location: [facility] ENDOSCOPY;  Service: Gastroenterology;;   BIOPSY  01/14/2020   Procedure: BIOPSY;  Surgeon: Lemar Lofty., MD;  Location: [facility] ENDOSCOPY;  Service: Gastroenterology;;   CESAREAN SECTION     x2   CLOSED REDUCTION PROXIMAL TIBIOFIBULAR JOINT DISLOCATON  2004   left   COLONOSCOPY     COLONOSCOPY WITH PROPOFOL N/A 09/29/2018   Procedure: COLONOSCOPY WITH PROPOFOL;  Surgeon: Lemar Lofty., MD;  Location: [facility] ENDOSCOPY;  Service: Gastroenterology;  Laterality:  N/A;   DILATION AND CURETTAGE OF UTERUS     ECTOPIC PREGNANCY SURGERY     ESOPHAGOGASTRODUODENOSCOPY N/A 02/12/2018   Procedure: ESOPHAGOGASTRODUODENOSCOPY (EGD);  Surgeon: Lemar Lofty., MD;  Location: [facility] ENDOSCOPY;  Service: Gastroenterology;  Laterality: N/A;   ESOPHAGOGASTRODUODENOSCOPY N/A 09/29/2018   Procedure: ESOPHAGOGASTRODUODENOSCOPY (EGD);  Surgeon: Lemar Lofty., MD;  Location: [facility] ENDOSCOPY;  Service: Gastroenterology;  Laterality: N/A;   ESOPHAGOGASTRODUODENOSCOPY (EGD) WITH PROPOFOL N/A 01/14/2020   Procedure: ESOPHAGOGASTRODUODENOSCOPY (EGD) WITH PROPOFOL;  Surgeon: Meridee Score Netty Starring., MD;  Location: [facility] ENDOSCOPY;  Service: Gastroenterology;  Laterality: N/A;   EUS N/A 02/12/2018   Procedure: UPPER ENDOSCOPIC ULTRASOUND (EUS) RADIAL;  Surgeon: Meridee Score Netty Starring., MD;  Location: [facility] ENDOSCOPY;  Service: Gastroenterology;  Laterality: N/A;   FIBULA FRACTURE SURGERY     GANGLION CYST EXCISION     POLYPECTOMY  02/12/2018   Procedure: POLYPECTOMY;  Surgeon: Mansouraty, Netty Starring., MD;  Location: [facility] ENDOSCOPY;  Service: Gastroenterology;;   POLYPECTOMY  01/14/2020   Procedure: POLYPECTOMY;  Surgeon: Lemar Lofty., MD;  Location: [facility] ENDOSCOPY;  Service: Gastroenterology;;   TONSILLECTOMY     TUBAL LIGATION     One Tube   UPPER GASTROINTESTINAL ENDOSCOPY     08/27/17    Prior to Admission medications   Medication Sig Start Date End Date Taking? Authorizing Provider  aspirin 81 MG tablet Take 81 mg  by mouth daily.     Yes [provider]  atorvastatin (LIPITOR) 80 MG tablet TAKE 1 TABLET BY MOUTH ONCE DAILY AT 6PM 02/22/22  Yes Panosh, Neta Mends, MD  canagliflozin (INVOKANA) 300 MG TABS tablet TAKE 1 TABLET BY MOUTH EVERY DAY BEFORE breakfast 02/27/22  Yes Panosh, Neta Mends, MD  Cholecalciferol (VITAMIN D3) 125 MCG (5000 UT) CAPS Take 5,000 Units by mouth daily.   Yes [provider]  Cranberry-Vitamin C-Vitamin E (CRANBERRY PLUS VITAMIN  C) 4200-20-3 MG-MG-UNIT CAPS Take 2 capsules by mouth daily.   Yes [provider]  escitalopram (LEXAPRO) 10 MG tablet Take 10 mg by mouth daily. 03/17/20  Yes [provider]  ezetimibe (ZETIA) 10 MG tablet TAKE 1 TABLET BY MOUTH EVERY DAY 02/19/22  Yes Lewayne Bunting, MD  levothyroxine (SYNTHROID) 50 MCG tablet TAKE 1 TABLET BY MOUTH EVERY DAY 02/22/22  Yes Panosh, Neta Mends, MD  metFORMIN (GLUCOPHAGE-XR) 500 MG 24 hr tablet TAKE 2 TABLETS BY MOUTH TWICE DAILY WITH MEALS 02/22/22  Yes Panosh, Neta Mends, MD  Multiple Vitamins-Minerals (MULTIVITAMIN PO) Take 1 tablet by mouth daily.    Yes [provider]  Omega-3 Fatty Acids (FISH OIL) 1000 MG CAPS Take 2,000 mg by mouth daily.   Yes [provider]  omeprazole (PRILOSEC) 40 MG capsule TAKE 1 CAPSULE BY MOUTH 2 TIMES DAILY 02/22/22  Yes Panosh, Neta Mends, MD  vitamin B-12 (CYANOCOBALAMIN) 1000 MCG tablet Take 2,000 mcg by mouth daily.   Yes [provider]  acetaminophen (TYLENOL) 500 MG tablet Take 1,000 mg by mouth every 6 (six) hours as needed for moderate pain or headache.    [provider]  clindamycin (CLEOCIN T) 1 % lotion Apply 1 application  topically daily. 06/02/18   [provider]  doxycycline (VIBRA-TABS) 100 MG tablet Take 1 tablet (100 mg total) by mouth 2 (two) times daily. Patient not taking: Reported on 05/21/2022 02/04/22   Jannifer Rodney A, FNP  Ferrous Sulfate (IRON) 325 (65 Fe) MG TABS Take 650 mg by mouth daily.     [provider]  fluticasone (FLONASE) 50 MCG/ACT nasal spray Place 2 sprays into both nostrils daily. Patient not taking: Reported on 05/21/2022 01/31/22   Waldon Merl, PA-C  Lancet Devices (ONE TOUCH DELICA LANCING DEV) MISC Use as directed to check blood sugars twice to four time per day. Dx E11.9 10/25/17   Panosh, Neta Mends, MD  Lancets [facility] DELICA PLUS [place]) MISC USE TO check blood sugar 2-3 TIMES A DAY 11/17/21   Panosh, Neta Mends,  MD  LORazepam (ATIVAN) 0.5 MG tablet Take 1-2 tabs    ( 0.5-1 mg) pre procedure 08/14/21   Panosh, Neta Mends, MD  [facility] ULTRA test strip USE TO check blood sugar 2-3 TIMES A DAY 11/17/21   Panosh, Neta Mends, MD  PFIZER-BIONT COVID-19 VAC-TRIS SUSP injection  06/18/20   [provider]  Semaglutide, 2 MG/DOSE, (OZEMPIC, 2 MG/DOSE,) 8 MG/3ML SOPN Inject 2 mg as directed once a week. 05/13/22   Worthy Rancher B, FNP  tretinoin (RETIN-A) 0.025 % cream Apply topically. Patient not taking: Reported on 05/21/2022 08/02/21   [provider]    Current Outpatient Medications  Medication Sig Dispense Refill   aspirin 81 MG tablet Take 81 mg by mouth daily.       atorvastatin (LIPITOR) 80 MG tablet TAKE 1 TABLET BY MOUTH ONCE DAILY AT 6PM 90 tablet 0   canagliflozin (INVOKANA) 300 MG TABS  tablet TAKE 1 TABLET BY MOUTH EVERY DAY BEFORE breakfast 90 tablet 1   Cholecalciferol (VITAMIN D3) 125 MCG (5000 UT) CAPS Take 5,000 Units by mouth daily.     Cranberry-Vitamin C-Vitamin E (CRANBERRY PLUS VITAMIN C) 4200-20-3 MG-MG-UNIT CAPS Take 2 capsules by mouth daily.     escitalopram (LEXAPRO) 10 MG tablet Take 10 mg by mouth daily.     ezetimibe (ZETIA) 10 MG tablet TAKE 1 TABLET BY MOUTH EVERY DAY 90 tablet 1   levothyroxine (SYNTHROID) 50 MCG tablet TAKE 1 TABLET BY MOUTH EVERY DAY 90 tablet 0   metFORMIN (GLUCOPHAGE-XR) 500 MG 24 hr tablet TAKE 2 TABLETS BY MOUTH TWICE DAILY WITH MEALS 360 tablet 0   Multiple Vitamins-Minerals (MULTIVITAMIN PO) Take 1 tablet by mouth daily.      Omega-3 Fatty Acids (FISH OIL) 1000 MG CAPS Take 2,000 mg by mouth daily.     omeprazole (PRILOSEC) 40 MG capsule TAKE 1 CAPSULE BY MOUTH 2 TIMES DAILY 180 capsule 0   vitamin B-12 (CYANOCOBALAMIN) 1000 MCG tablet Take 2,000 mcg by mouth daily.     acetaminophen (TYLENOL) 500 MG tablet Take 1,000 mg by mouth every 6 (six) hours as needed for moderate pain or headache.     clindamycin (CLEOCIN T) 1 % lotion Apply 1  application  topically daily.     doxycycline (VIBRA-TABS) 100 MG tablet Take 1 tablet (100 mg total) by mouth 2 (two) times daily. (Patient not taking: Reported on 05/21/2022) 20 tablet 0   Ferrous Sulfate (IRON) 325 (65 Fe) MG TABS Take 650 mg by mouth daily.      fluticasone (FLONASE) 50 MCG/ACT nasal spray Place 2 sprays into both nostrils daily. (Patient not taking: Reported on 05/21/2022) 16 g 0   Lancet Devices (ONE TOUCH DELICA LANCING DEV) MISC Use as directed to check blood sugars twice to four time per day. Dx E11.9 1 each 11   Lancets (ONETOUCH DELICA PLUS LANCET33G) MISC USE TO check blood sugar 2-3 TIMES A DAY 100 each 1   LORazepam (ATIVAN) 0.5 MG tablet Take 1-2 tabs    ( 0.5-1 mg) pre procedure 8 tablet 0   ONETOUCH ULTRA test strip USE TO check blood sugar 2-3 TIMES A DAY 100 strip 1   PFIZER-BIONT COVID-19 VAC-TRIS SUSP injection      Semaglutide, 2 MG/DOSE, (OZEMPIC, 2 MG/DOSE,) 8 MG/3ML SOPN Inject 2 mg as directed once a week. 3 mL 1   tretinoin (RETIN-A) 0.025 % cream Apply topically. (Patient not taking: Reported on 05/21/2022)     Current Facility-Administered Medications  Medication Dose Route Frequency Provider Last Rate Last Admin   0.9 %  sodium chloride infusion  500 mL Intravenous Once Napoleon Form, MD        Allergies as of 05/21/2022 - Review Complete 05/21/2022  Allergen Reaction Noted   Penicillins Rash 02/24/2010    Family History  Problem Relation Age of Onset   Emphysema Mother        Smoker   Heart attack Mother        due to head injury   Alcohol abuse Mother    Heart disease Mother    Early death Mother    Varicose Veins Mother    AAA (abdominal aortic aneurysm) Mother    Lung cancer Father    Heart attack Father    Hypertension Father    Alcohol abuse Father    Heart disease Father        before  age 68   Aneurysm Father    Hyperlipidemia Father    Alcohol abuse Brother    Diabetes Brother    Heart disease Brother    Heart  attack Brother    Diabetes Brother    Aneurysm Paternal Grandmother    Diabetes Other        grandfather   Sudden Cardiac Death Brother    Colon cancer Neg Hx    Esophageal cancer Neg Hx    Stomach cancer Neg Hx    Inflammatory bowel disease Neg Hx    Liver disease Neg Hx    Pancreatic cancer Neg Hx    Rectal cancer Neg Hx     Social History   Socioeconomic History   Marital status: Widowed    Spouse name: Not on file   Number of children: 2   Years of education: phd    Highest education level: Not on file  Occupational History   Occupation: Mental Health Counselor    Comment: [employer]   Occupation: Glass blower/designer: [employer]  Tobacco Use   Smoking status: Former    Types: Cigarettes    Quit date: 02/05/1994    Years since quitting: 28.3   Smokeless tobacco: Never  Vaping Use   Vaping Use: Never used  Substance and Sexual Activity   Alcohol use: No    Alcohol/week: 0.0 standard drinks of alcohol   Drug use: No   Sexual activity: Not on file  Other Topics Concern   Not on file  Social History Narrative   HH of 2 currently    No tobacco    Separated     Recently widowed   5 14       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    No firearms    NO reg exercise    Pets- Poodles 2    Sleep 7 hours    Social Determinants of Health   Financial Resource Strain: Low Risk  (11/29/2021)   Overall Financial Resource Strain (CARDIA)    Difficulty of Paying Living Expenses: Not hard at all  Food Insecurity: No Food Insecurity (11/29/2021)   Hunger Vital Sign    Worried About Running Out of Food in the Last Year: Never true    Ran Out of Food in the Last Year: Never true  Transportation Needs: No Transportation Needs (11/29/2021)   PRAPARE - Administrator, Civil Service (Medical): No    Lack of Transportation (Non-Medical): No  Physical  Activity: Insufficiently Active (11/29/2021)   Exercise Vital Sign    Days of Exercise per Week: 2 days    Minutes of Exercise per Session: 30 min  Stress: No Stress Concern Present (11/29/2021)   [employer] of Occupational Health - Occupational Stress Questionnaire    Feeling of Stress : Not at all  Social Connections: Moderately Integrated (11/29/2021)   Social Connection and Isolation Panel [NHANES]    Frequency of Communication with Friends and Family: More than three times a week    Frequency of Social Gatherings with Friends and Family: More than three times a week    Attends Religious Services: More than 4 times per year    Active Member of [employer] or Organizations: Yes    Attends Banker Meetings: More than 4 times per year    Marital  Status: Widowed  Intimate Partner Violence: Not At Risk (11/29/2021)   Humiliation, Afraid, Rape, and Kick questionnaire    Fear of Current or Ex-Partner: No    Emotionally Abused: No    Physically Abused: No    Sexually Abused: No    Review of Systems:  All other review of systems negative except as mentioned in the HPI.  Physical Exam: Vital signs in last 24 hours: Blood Pressure (Abnormal) 118/58   Pulse 73   Temperature (Abnormal) 97.3 F (36.3 C)   Respiration 12   Height 5\' 5"  (1.651 m)   Weight 156 lb (70.8 kg)   Last Menstrual Period  (LMP Unknown)   Oxygen Saturation 100%   Body Mass Index 25.96 kg/m  General:   Alert, NAD Lungs:  Clear .   Heart:  Regular rate and rhythm Abdomen:  Soft, nontender and nondistended. Neuro/Psych:  Alert and cooperative. Normal mood and affect. A and O x 3  Reviewed labs, radiology imaging, old records and pertinent past GI work up  Patient is appropriate for planned procedure(s) and anesthesia in an ambulatory setting   K. Scherry Ran , MD [phone]

## 2022-05-21 NOTE — Progress Notes (Signed)
Patient coughing with transient O2 desaturation during EGD; oropharynx suctioned with scant amount of clear secretions. Nothing gastric noted. Uneventful anesthetic. Report to pacu rn. Vss. Care resumed by rn.

## 2022-05-21 NOTE — Op Note (Signed)
[facility] Patient Name: Morgan [name] Procedure Date: 05/21/2022 7:30 AM MRN: [id number] Endoscopist: Napoleon Form , MD, [id number] Age: 72 Referring MD:  Date of Birth: May 19, 1950 Gender: Female Account #: [id number] Procedure:                Colonoscopy Indications:              High risk colon cancer surveillance: Personal                            history of colonic polyps, High risk colon cancer                            surveillance: Personal history of adenoma (10 mm or                            greater in size), High risk colon cancer                            surveillance: Personal history of sessile serrated                            colon polyp (10 mm or greater in size) Medicines:                Monitored Anesthesia Care Procedure:                Pre-Anesthesia Assessment:                           - Prior to the procedure, a History and Physical                            was performed, and patient medications and                            allergies were reviewed. The patient's tolerance of                            previous anesthesia was also reviewed. The risks                            and benefits of the procedure and the sedation                            options and risks were discussed with the patient.                            All questions were answered, and informed consent                            was obtained. Prior Anticoagulants: The patient has                            taken no anticoagulant or antiplatelet agents. ASA  Grade Assessment: II - A patient with mild systemic                            disease. After reviewing the risks and benefits,                            the patient was deemed in satisfactory condition to                            undergo the procedure.                           After obtaining informed consent, the colonoscope                            was passed under  direct vision. Throughout the                            procedure, the patient's blood pressure, pulse, and                            oxygen saturations were monitored continuously. The                            Olympus [id number]-[id number] (#[id number]) Colonoscope was                            introduced through the anus and advanced to the the                            cecum, identified by appendiceal orifice and                            ileocecal valve. The colonoscopy was performed                            without difficulty. The patient tolerated the                            procedure well. The quality of the bowel                            preparation was good. The ileocecal valve,                            appendiceal orifice, and rectum were photographed. Scope In: 7:55:03 AM Scope Out: 8:09:20 AM Scope Withdrawal Time: 0 hours 7 minutes 21 seconds  Total Procedure Duration: 0 hours 14 minutes 17 seconds  Findings:                 The perianal and digital rectal examinations were                            normal.  A few small-mouthed diverticula were found in the                            sigmoid colon.                           Anal papilla(e) were hypertrophied.                           Non-bleeding external and internal hemorrhoids were                            found during retroflexion. The hemorrhoids were                            small.                           The exam was otherwise without abnormality. Complications:            No immediate complications. Estimated Blood Loss:     Estimated blood loss was minimal. Impression:               - Diverticulosis in the sigmoid colon.                           - Anal papilla(e) were hypertrophied.                           - Non-bleeding external and internal hemorrhoids.                           - The examination was otherwise normal.                           - No specimens collected.                            - The GI Genius (intelligent endoscopy module),                            computer-aided polyp detection system powered by AI                            was utilized to detect colorectal polyps through                            enhanced visualization during colonoscopy. Recommendation:           - Resume previous diet.                           - Continue present medications.                           - Repeat colonoscopy in 5 years for surveillance.                           -  Return to GI clinic in 6 months. Napoleon Form, MD 05/21/2022 8:15:24 AM This report has been signed electronically.

## 2022-05-21 NOTE — Op Note (Signed)
[facility] Patient Name: Morgan [name] Procedure Date: 05/21/2022 7:30 AM MRN: [id number] Endoscopist: Napoleon Form , MD, [id number] Age: 72 Referring MD:  Date of Birth: [birth year] Gender: Female Account #: [id number] Procedure:                Upper GI endoscopy Indications:              Follow-up of gastric polyps, Follow-up of                            intestinal metaplasia, h/o gastric nodule with                            dysplasia s/p resection Medicines:                Monitored Anesthesia Care Procedure:                Pre-Anesthesia Assessment:                           - Prior to the procedure, a History and Physical                            was performed, and patient medications and                            allergies were reviewed. The patient's tolerance of                            previous anesthesia was also reviewed. The risks                            and benefits of the procedure and the sedation                            options and risks were discussed with the patient.                            All questions were answered, and informed consent                            was obtained. Prior Anticoagulants: The patient has                            taken no anticoagulant or antiplatelet agents. ASA                            Grade Assessment: II - A patient with mild systemic                            disease. After reviewing the risks and benefits,                            the patient was deemed in satisfactory condition to  undergo the procedure.                           After obtaining informed consent, the endoscope was                            passed under direct vision. Throughout the                            procedure, the patient's blood pressure, pulse, and                            oxygen saturations were monitored continuously. The                            Olympus Scope [id number] was  introduced through the                            mouth, and advanced to the second part of duodenum.                            The upper GI endoscopy was accomplished without                            difficulty. The patient tolerated the procedure                            well. Scope In: Scope Out: Findings:                 There were esophageal mucosal changes suspicious                            for short-segment Barrett's esophagus present at                            the gastroesophageal junction. The maximum                            longitudinal extent of these mucosal changes was 2                            cm in length. Mucosa was biopsied with a cold                            forceps for histology in a targeted manner at 37                            and 38 cm from the incisors. One specimen bottle                            was sent to pathology.                           A 3 cm hiatal hernia was  present.                           Patchy mild inflammation characterized by                            congestion (edema), erythema and friability was                            found in the entire examined stomach. Biopsies were                            taken with a cold forceps for histology. Biopsies                            were taken with a cold forceps for Helicobacter                            pylori testing.                           The cardia and gastric fundus were normal on                            retroflexion.                           The examined duodenum was normal. Complications:            No immediate complications. Estimated Blood Loss:     Estimated blood loss was minimal. Impression:               - Esophageal mucosal changes suspicious for                            short-segment Barrett's esophagus. Biopsied.                           - 3 cm hiatal hernia.                           - Gastritis. Biopsied.                           - Normal  examined duodenum. Recommendation:           - Patient has a contact number available for                            emergencies. The signs and symptoms of potential                            delayed complications were discussed with the                            patient. Return to normal activities tomorrow.  Written discharge instructions were provided to the                            patient.                           - Resume previous diet.                           - Continue present medications.                           - Await pathology results.                           - Repeat upper endoscopy based on pathology results                            for surveillance. Napoleon Form, MD 05/21/2022 8:20:03 AM This report has been signed electronically.

## 2022-05-21 NOTE — Progress Notes (Signed)
Called to room to assist during endoscopic procedure.  Patient ID and intended procedure confirmed with present staff. Received instructions for my participation in the procedure from the performing physician.  

## 2022-05-21 NOTE — Patient Instructions (Addendum)
Thank you for letting us take care of your healthcare needs today. Please see handouts given to you on Diverticulosis, Gastritis and Hiatal Hernia.    YOU HAD AN ENDOSCOPIC PROCEDURE TODAY AT THE [facility]:   Refer to the procedure report that was given to you for any specific questions about what was found during the examination.  If the procedure report does not answer your questions, please call your gastroenterologist to clarify.  If you requested that your care partner not be given the details of your procedure findings, then the procedure report has been included in a sealed envelope for you to review at your convenience later.  YOU SHOULD EXPECT: Some feelings of bloating in the abdomen. Passage of more gas than usual.  Walking can help get rid of the air that was put into your GI tract during the procedure and reduce the bloating. If you had a lower endoscopy (such as a colonoscopy or flexible sigmoidoscopy) you may notice spotting of blood in your stool or on the toilet paper. If you underwent a bowel prep for your procedure, you may not have a normal bowel movement for a few days.  Please Note:  You might notice some irritation and congestion in your nose or some drainage.  This is from the oxygen used during your procedure.  There is no need for concern and it should clear up in a day or so.  SYMPTOMS TO REPORT IMMEDIATELY:  Following lower endoscopy (colonoscopy or flexible sigmoidoscopy):  Excessive amounts of blood in the stool  Significant tenderness or worsening of abdominal pains  Swelling of the abdomen that is new, acute  Fever of 100F or higher  Following upper endoscopy (EGD)  Vomiting of blood or coffee ground material  New chest pain or pain under the shoulder blades  Painful or persistently difficult swallowing  New shortness of breath  Fever of 100F or higher  Black, tarry-looking stools  For urgent or emergent issues, a gastroenterologist can be  reached at any hour by calling [phone]. Do not use MyChart messaging for urgent concerns.    DIET:  We do recommend a small meal at first, but then you may proceed to your regular diet.  Drink plenty of fluids but you should avoid alcoholic beverages for 24 hours.  ACTIVITY:  You should plan to take it easy for the rest of today and you should NOT DRIVE or use heavy machinery until tomorrow (because of the sedation medicines used during the test).    FOLLOW UP: Our staff will call the number listed on your records the next business day following your procedure.  We will call around 7:15- 8:00 am to check on you and address any questions or concerns that you may have regarding the information given to you following your procedure. If we do not reach you, we will leave a message.     If any biopsies were taken you will be contacted by phone or by letter within the next 1-3 weeks.  Please call us at [phone] if you have not heard about the biopsies in 3 weeks.    SIGNATURES/CONFIDENTIALITY: You and/or your care partner have signed paperwork which will be entered into your electronic medical record.  These signatures attest to the fact that that the information above on your After Visit Summary has been reviewed and is understood.  Full responsibility of the confidentiality of this discharge information lies with you and/or your care-partner.

## 2022-05-22 ENCOUNTER — Telehealth: Payer: Self-pay

## 2022-05-22 NOTE — Telephone Encounter (Signed)
  Follow up Call-     05/21/2022    7:12 AM  Call back number  Post procedure Call Back phone  # [phone]  Permission to leave phone message Yes     Patient questions:  Do you have a fever, pain , or abdominal swelling? No. Pain Score  0 *  Have you tolerated food without any problems? Yes.    Have you been able to return to your normal activities? Yes.    Do you have any questions about your discharge instructions: Diet   No. Medications  No. Follow up visit  No.  Do you have questions or concerns about your Care? No.  Actions: * If pain score is 4 or above: No action needed, pain <4.

## 2022-05-24 ENCOUNTER — Other Ambulatory Visit: Payer: Self-pay | Admitting: Internal Medicine

## 2022-06-05 ENCOUNTER — Encounter: Payer: Self-pay | Admitting: Gastroenterology

## 2022-06-07 ENCOUNTER — Other Ambulatory Visit (HOSPITAL_BASED_OUTPATIENT_CLINIC_OR_DEPARTMENT_OTHER): Payer: Self-pay

## 2022-07-15 ENCOUNTER — Other Ambulatory Visit: Payer: Self-pay | Admitting: Family

## 2022-07-15 DIAGNOSIS — E1165 Type 2 diabetes mellitus with hyperglycemia: Secondary | ICD-10-CM

## 2022-07-18 ENCOUNTER — Other Ambulatory Visit (HOSPITAL_BASED_OUTPATIENT_CLINIC_OR_DEPARTMENT_OTHER): Payer: Self-pay

## 2022-07-18 ENCOUNTER — Other Ambulatory Visit: Payer: Self-pay | Admitting: Family

## 2022-07-18 ENCOUNTER — Other Ambulatory Visit: Payer: Self-pay | Admitting: Internal Medicine

## 2022-07-18 DIAGNOSIS — E1165 Type 2 diabetes mellitus with hyperglycemia: Secondary | ICD-10-CM

## 2022-07-18 MED ORDER — ESCITALOPRAM OXALATE 10 MG PO TABS: 10.0000 mg | ORAL_TABLET | Freq: Every day | ORAL | 1 refills | Status: DC

## 2022-07-19 ENCOUNTER — Other Ambulatory Visit (HOSPITAL_BASED_OUTPATIENT_CLINIC_OR_DEPARTMENT_OTHER): Payer: Self-pay

## 2022-07-19 MED ORDER — OZEMPIC (2 MG/DOSE) 8 MG/3ML ~~LOC~~ SOPN
2.0000 mg | PEN_INJECTOR | SUBCUTANEOUS | 1 refills | Status: DC
Start: 2022-07-19 — End: 2022-08-24
  Filled 2022-07-19: qty 3, 28d supply, fill #0
  Filled 2022-08-12: qty 3, 28d supply, fill #1

## 2022-07-19 NOTE — Telephone Encounter (Signed)
Pt called and is very frustrated.   Pt states she has never had so many issues getting refills, or trying to speak with or get an appointment with her MD.  Pt states if this continues, she will have to get another provider.   Pt is asking for this refill ASAP, as she needs to take her next shot by Sunday.

## 2022-07-23 ENCOUNTER — Other Ambulatory Visit: Payer: Self-pay | Admitting: Internal Medicine

## 2022-08-10 ENCOUNTER — Other Ambulatory Visit: Payer: Self-pay | Admitting: Family

## 2022-08-12 ENCOUNTER — Other Ambulatory Visit (HOSPITAL_BASED_OUTPATIENT_CLINIC_OR_DEPARTMENT_OTHER): Payer: Self-pay

## 2022-08-14 ENCOUNTER — Telehealth: Payer: Self-pay | Admitting: Internal Medicine

## 2022-08-14 DIAGNOSIS — E1165 Type 2 diabetes mellitus with hyperglycemia: Secondary | ICD-10-CM

## 2022-08-14 NOTE — Telephone Encounter (Signed)
Okay to refill until pt can see endo?

## 2022-08-14 NOTE — Telephone Encounter (Signed)
Pt states she has been taking Ozempic, but was referred to an Endocrinologist.   Pt states Endocrinologist cannot see her until November.  Pt is asking if MD could please send a refill, until she can see Endocrinologist in November?

## 2022-08-17 ENCOUNTER — Other Ambulatory Visit: Payer: Self-pay | Admitting: Family

## 2022-08-24 ENCOUNTER — Other Ambulatory Visit (HOSPITAL_BASED_OUTPATIENT_CLINIC_OR_DEPARTMENT_OTHER): Payer: Self-pay

## 2022-08-24 MED ORDER — OZEMPIC (2 MG/DOSE) 8 MG/3ML ~~LOC~~ SOPN
2.0000 mg | PEN_INJECTOR | SUBCUTANEOUS | 2 refills | Status: AC
Start: 2022-08-24 — End: ?
  Filled 2022-08-24 – 2022-09-10 (×2): qty 3, 28d supply, fill #0
  Filled 2022-10-07: qty 3, 28d supply, fill #1

## 2022-08-24 NOTE — Telephone Encounter (Signed)
Yes

## 2022-08-24 NOTE — Telephone Encounter (Signed)
Rx sent. Attempted to reach pt. To call us back if have any question.

## 2022-09-10 ENCOUNTER — Other Ambulatory Visit (HOSPITAL_BASED_OUTPATIENT_CLINIC_OR_DEPARTMENT_OTHER): Payer: Self-pay

## 2022-09-20 ENCOUNTER — Encounter (INDEPENDENT_AMBULATORY_CARE_PROVIDER_SITE_OTHER): Payer: Self-pay

## 2022-09-27 ENCOUNTER — Encounter: Payer: PPO | Admitting: Internal Medicine

## 2022-10-08 ENCOUNTER — Other Ambulatory Visit (HOSPITAL_BASED_OUTPATIENT_CLINIC_OR_DEPARTMENT_OTHER): Payer: Self-pay

## 2022-10-09 ENCOUNTER — Other Ambulatory Visit (HOSPITAL_BASED_OUTPATIENT_CLINIC_OR_DEPARTMENT_OTHER): Payer: Self-pay

## 2022-10-12 ENCOUNTER — Ambulatory Visit (INDEPENDENT_AMBULATORY_CARE_PROVIDER_SITE_OTHER): Payer: PPO

## 2022-10-12 VITALS — Ht 65.0 in | Wt 156.0 lb

## 2022-10-12 DIAGNOSIS — Z0000 Encounter for general adult medical examination without abnormal findings: Secondary | ICD-10-CM | POA: Diagnosis not present

## 2022-10-12 NOTE — Progress Notes (Signed)
Subjective:   Morgan [name] is a 72 y.o. female who presents for Medicare Annual (Subsequent) preventive examination.  Visit Complete: Virtual  I connected with  [name] on 10/12/22 by a audio enabled telemedicine application and verified that I am speaking with the correct person using two identifiers.  Patient Location: Home  Provider Location: Home Office  I discussed the limitations of evaluation and management by telemedicine. The patient expressed understanding and agreed to proceed.  Patient Medicare AWV questionnaire was completed by the patient on 10/12/22; I have confirmed that all information answered by patient is correct and no changes since this date.  Review of Systems  Cardiac Risk Factors include: advanced age (>36men, >23 women);diabetes mellitusVital Signs: Unable to obtain new vitals due to this being a telehealth visit.      Objective:    Today's Vitals   10/12/22 0819  Weight: 156 lb (70.8 kg)  Height: 5\' 5"  (1.651 m)   Body mass index is 25.96 kg/m.     10/12/2022    8:29 AM 11/29/2021    8:41 AM 11/16/2020    8:13 AM 01/14/2020    6:59 AM 11/11/2019    8:20 AM 09/29/2018    6:58 AM 02/12/2018    7:59 AM  Advanced Directives  Does Patient Have a Medical Advance Directive? Yes Yes Yes Yes Yes Yes Yes  Type of Estate agent of [place];Living will Healthcare Power of [place];Living will Healthcare Power of [employer];Living will  Healthcare Power of [place];Living will Healthcare Power of [place];Living will Healthcare Power of [place];Living will  Copy of Healthcare Power of Attorney in Chart? No - copy requested No - copy requested No - copy requested  No - copy requested No - copy requested No - copy requested    Current Medications (verified) Outpatient Encounter Medications as of 10/12/2022  Medication Sig   acetaminophen (TYLENOL) 500 MG tablet Take 1,000 mg by mouth every 6 (six) hours as needed for moderate pain  or headache.   aspirin 81 MG tablet Take 81 mg by mouth daily.     atorvastatin (LIPITOR) 80 MG tablet TAKE 1 TABLET BY MOUTH ONCE DAILY AT 6PM   Cholecalciferol (VITAMIN D3) 125 MCG (5000 UT) CAPS Take 5,000 Units by mouth daily.   clindamycin (CLEOCIN T) 1 % lotion Apply 1 application  topically daily.   Cranberry-Vitamin C-Vitamin E (CRANBERRY PLUS VITAMIN C) 4200-20-3 MG-MG-UNIT CAPS Take 2 capsules by mouth daily.   doxycycline (VIBRA-TABS) 100 MG tablet Take 1 tablet (100 mg total) by mouth 2 (two) times daily. (Patient not taking: Reported on 05/21/2022)   escitalopram (LEXAPRO) 10 MG tablet Take 1 tablet (10 mg total) by mouth daily.   ezetimibe (ZETIA) 10 MG tablet TAKE 1 TABLET BY MOUTH EVERY DAY   Ferrous Sulfate (IRON) 325 (65 Fe) MG TABS Take 650 mg by mouth daily.    fluticasone (FLONASE) 50 MCG/ACT nasal spray Place 2 sprays into both nostrils daily. (Patient not taking: Reported on 05/21/2022)   INVOKANA 300 MG TABS tablet TAKE 1 TABLET BY MOUTH EVERY DAY BEFORE BREAKFAST   Lancet Devices (ONE TOUCH DELICA LANCING DEV) MISC Use as directed to check blood sugars twice to four time per day. Dx E11.9   Lancets (ONETOUCH DELICA PLUS LANCET33G) MISC USE TO check blood sugar 2-3 TIMES A DAY   levothyroxine (SYNTHROID) 50 MCG tablet TAKE 1 TABLET BY MOUTH EVERY DAY   LORazepam (ATIVAN) 0.5 MG tablet Take 1-2 tabs    (  0.5-1 mg) pre procedure   metFORMIN (GLUCOPHAGE-XR) 500 MG 24 hr tablet TAKE 2 TABLETS BY MOUTH TWICE DAILY WITH MEALS   Multiple Vitamins-Minerals (MULTIVITAMIN PO) Take 1 tablet by mouth daily.    Omega-3 Fatty Acids (FISH OIL) 1000 MG CAPS Take 2,000 mg by mouth daily.   omeprazole (PRILOSEC) 40 MG capsule TAKE 1 CAPSULE BY MOUTH 2 TIMES DAILY   ONETOUCH ULTRA test strip USE TO check blood sugar 2-3 TIMES A DAY   PFIZER-BIONT COVID-19 VAC-TRIS SUSP injection    Semaglutide, 2 MG/DOSE, (OZEMPIC, 2 MG/DOSE,) 8 MG/3ML SOPN Inject 2 mg as directed once a week.   tretinoin  (RETIN-A) 0.025 % cream Apply topically. (Patient not taking: Reported on 05/21/2022)   vitamin B-12 (CYANOCOBALAMIN) 1000 MCG tablet Take 2,000 mcg by mouth daily.   No facility-administered encounter medications on file as of 10/12/2022.    Allergies (verified) Penicillins   History: Past Medical History:  Diagnosis Date   Anemia    as a child    CAD, NATIVE VESSEL 05/13/2009   Cataracts, bilateral    MD just watching   Complication of anesthesia    DIABETES MELLITUS, TYPE II 12/09/2009   Gastric ulcer    Gastritis    GERD (gastroesophageal reflux disease)    Hx of abnormal cervical Pap smear    cryo  but normal since then    HYPERLIPIDEMIA-MIXED 04/14/2008   Intestinal metaplasia of gastric mucosa 2012   Myocardial infarction (HCC)    2003   Obesity    Occult blood in stools    Osteopenia    OVERWEIGHT/OBESITY 05/13/2009   Thyroid disease    Ulcer    Duodenal ulcer by x-ray 40 years ago   Varicose veins    under rx [facility] 2016   Past Surgical History:  Procedure Laterality Date   BIOPSY  09/29/2018   Procedure: BIOPSY;  Surgeon: Lemar Lofty., MD;  Location: [facility] ENDOSCOPY;  Service: Gastroenterology;;   BIOPSY  01/14/2020   Procedure: BIOPSY;  Surgeon: Lemar Lofty., MD;  Location: [facility] END[facility]COPY;  Service: Gastroenterology;;   CESAREAN SECTION     x2   CLOSED REDUCTION PROXIMAL TIBIOFIBULAR JOINT DISLOCATON  2004   left   COLONOSCOPY     COLONOSCOPY WITH PROPOFOL N/A 09/29/2018   Procedure: COLONOSCOPY WITH PROPOFOL;  Surgeon: Lemar Lofty., MD;  Location: [facility] ENDOSCOPY;  Service: Gastroenterology;  Laterality: N/A;   DILATION AND CURETTAGE OF UTERUS     ECTOPIC PREGNANCY SURGERY     ESOPHAGOGASTRODUODENOSCOPY N/A 02/12/2018   Procedure: ESOPHAGOGASTRODUODENOSCOPY (EGD);  Surgeon: Lemar Lofty., MD;  Location: [facility];  Service: Gastroenterology;  Laterality: N/A;   ESOPHAGOGASTRODUODENOSCOPY N/A 09/29/2018   Procedure:  ESOPHAGOGASTRODUODENOSCOPY (EGD);  Surgeon: Lemar Lofty., MD;  Location: [facility] ENDOSCOPY;  Service: Gastroenterology;  Laterality: N/A;   ESOPHAGOGASTRODUODENOSCOPY (EGD) WITH PROPOFOL N/A 01/14/2020   Procedure: ESOPHAGOGASTRODUODENOSCOPY (EGD) WITH PROPOFOL;  Surgeon: Meridee Score Netty Starring., MD;  Location: [facility] ENDOSCOPY;  Service: Gastroenterology;  Laterality: N/A;   EUS N/A 02/12/2018   Procedure: UPPER ENDOSCOPIC ULTRASOUND (EUS) RADIAL;  Surgeon: Meridee Score Netty Starring., MD;  Location: [facility] ENDOSCOPY;  Service: Gastroenterology;  Laterality: N/A;   FIBULA FRACTURE SURGERY     GANGLION CYST EXCISION     POLYPECTOMY  02/12/2018   Procedure: POLYPECTOMY;  Surgeon: Meridee Score Netty Starring., MD;  Location: [facility];  Service: Gastroenterology;;   POLYPECTOMY  01/14/2020   Procedure: POLYPECTOMY;  Surgeon: Lemar Lofty., MD;  Location: [facility] ENDOSCOPY;  Service: Gastroenterology;;   TONSILLECTOMY     TUBAL LIGATION     One Tube   UPPER GASTROINTESTINAL ENDOSCOPY     08/27/17   Family History  Problem Relation Age of Onset   Emphysema Mother        Smoker   Heart attack Mother        due to head injury   Alcohol abuse Mother    Heart disease Mother    Early death Mother    Varicose Veins Mother    AAA (abdominal aortic aneurysm) Mother    Lung cancer Father    Heart attack Father    Hypertension Father    Alcohol abuse Father    Heart disease Father        before age 30   Aneurysm Father    Hyperlipidemia Father    Alcohol abuse Brother    Diabetes Brother    Heart disease Brother    Heart attack Brother    Diabetes Brother    Aneurysm Paternal Grandmother    Diabetes Other        grandfather   Sudden Cardiac Death Brother    Colon cancer Neg Hx    Esophageal cancer Neg Hx    Stomach cancer Neg Hx    Inflammatory bowel disease Neg Hx    Liver disease Neg Hx    Pancreatic cancer Neg Hx    Rectal cancer Neg Hx    Social History   Socioeconomic History    Marital status: Widowed    Spouse name: Not on file   Number of children: 2   Years of education: phd    Highest education level: Not on file  Occupational History   Occupation: Mental Health Counselor    Comment: [employer]   Occupation: Glass blower/designer: [employer]  Tobacco Use   Smoking status: Former    Current packs/day: 0.00    Types: Cigarettes    Quit date: 02/05/1994    Years since quitting: 28.7   Smokeless tobacco: Never  Vaping Use   Vaping status: Never Used  Substance and Sexual Activity   Alcohol use: No    Alcohol/week: 0.0 standard drinks of alcohol   Drug use: No   Sexual activity: Not on file  Other Topics Concern   Not on file  Social History Narrative   HH of 2 currently    No tobacco    Separated     Recently widowed   5 14       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    No firearms    NO reg exercise    Pets- Poodles 2    Sleep 7 hours    Social Determinants of Health   Financial Resource Strain: Low Risk  (10/12/2022)   Overall Financial Resource Strain (CARDIA)    Difficulty of Paying Living Expenses: Not hard at all  Food Insecurity: No Food Insecurity (10/12/2022)   Hunger Vital Sign    Worried About Running Out of Food in the Last Year: Never true    Ran Out of Food in the Last Year: Never true  Transportation Needs: No Transportation Needs (10/12/2022)   PRAPARE - Administrator, Civil Service (Medical): No    Lack of Transportation (Non-Medical): No  Physical Activity: Insufficiently Active (10/12/2022)   Exercise  Vital Sign    Days of Exercise per Week: 1 day    Minutes of Exercise per Session: 30 min  Stress: No Stress Concern Present (10/12/2022)   [employer] of Occupational Health - Occupational Stress Questionnaire    Feeling of Stress : Not at all  Social Connections: Moderately Integrated (10/12/2022)    Social Connection and Isolation Panel [NHANES]    Frequency of Communication with Friends and Family: More than three times a week    Frequency of Social Gatherings with Friends and Family: Twice a week    Attends Religious Services: More than 4 times per year    Active Member of [employer] or Organizations: Yes    Attends Banker Meetings: 1 to 4 times per year    Marital Status: Widowed    Tobacco Counseling Counseling given: Not Answered   Clinical Intake:  Pre-visit preparation completed: Yes  Pain : No/denies pain     BMI - recorded: 25.96 Nutritional Status: BMI 25 -29 Overweight Nutritional Risks: None Diabetes: Yes CBG done?: Yes CBG resulted in Enter/ Edit results?: Yes (CBG 177 Per patient) Did pt. bring in CBG monitor from home?: No  How often do you need to have someone help you when you read instructions, pamphlets, or other written materials from your doctor or pharmacy?: 1 - Never  Interpreter Needed?: No  Information entered by :: Theresa Mulligan LPN   Activities of Daily Living    10/12/2022    8:15 AM 11/29/2021    8:40 AM  In your present state of health, do you have any difficulty performing the following activities:  Hearing? 0 0  Vision? 0 0  Difficulty concentrating or making decisions? 0 0  Walking or climbing stairs? 0 0  Dressing or bathing? 0 0  Doing errands, shopping? 0 0  Preparing Food and eating ? N N  Using the Toilet? N N  In the past six months, have you accidently leaked urine? N N  Do you have problems with loss of bowel control? N N  Managing your Medications? N N  Managing your Finances? N N  Housekeeping or managing your Housekeeping? N N    Patient Care Team: Panosh, Neta Mends, MD as PCP - General (Internal Medicine) Jens Som Madolyn Frieze, MD as PCP - Cardiology (Cardiology) [place], Amy, MD as Consulting Physician (Dermatology) Marcelle Overlie, MD as Attending Physician (Obstetrics and Gynecology) Jens Som Madolyn Frieze, MD as Consulting Physician (Cardiology) Napoleon Form, MD as Consulting Physician (Gastroenterology) Lowanda Foster, MD (Psychiatry)  Indicate any recent Medical Services you may have received from other than [facility] providers in the past year (date may be approximate).     Assessment:   This is a routine wellness examination for [facility].  Hearing/Vision screen Hearing Screening - Comments:: Denies hearing difficulties   Vision Screening - Comments:: Wears rx glasses - up to date with routine eye exams with  [facility]   Goals Addressed               This Visit's Progress     Patient Stated (pt-stated)        Stay Healthy.        Depression Screen    10/12/2022    8:26 AM 02/20/2022    7:09 AM 11/29/2021    8:38 AM 08/14/2021    9:08 AM 01/23/2021    8:46 AM 11/16/2020    8:11 AM 08/01/2020   11:43 AM  PHQ 2/9 Scores  PHQ - 2 Score 0 0 0 0 0 0 0  PHQ- 9 Score    0 2  2    Fall Risk    10/12/2022    8:15 AM 02/20/2022    7:09 AM 11/29/2021    8:41 AM 08/14/2021    9:08 AM 01/23/2021    8:46 AM  Fall Risk   Falls in the past year? 0 0 0 0 0  Number falls in past yr: 0 0 0 0 1  Injury with Fall? 0 0 0 0   Risk for fall due to :  No Fall Risks No Fall Risks No Fall Risks   Follow up  Falls evaluation completed Falls prevention discussed Falls prevention discussed     MEDICARE RISK AT HOME: Medicare Risk at Home Any stairs in or around the home?: Yes If so, are there any without handrails?: No Home free of loose throw rugs in walkways, pet beds, electrical cords, etc?: Yes Adequate lighting in your home to reduce risk of falls?: Yes Life alert?: No Use of a cane, walker or w/c?: No Grab bars in the bathroom?: No Shower chair or bench in shower?: No Elevated toilet seat or a handicapped toilet?: No  TIMED UP AND GO:  Was the test performed?  No    Cognitive Function:        10/12/2022    8:29 AM 11/29/2021    8:41 AM 11/16/2020    8:16 AM  11/11/2019    8:24 AM  6CIT Screen  What Year? 0 points 0 points 0 points 0 points  What month? 0 points 0 points 0 points 0 points  What time? 0 points 0 points 0 points   Count back from 20 0 points 0 points 0 points 0 points  Months in reverse 0 points 0 points 0 points 0 points  Repeat phrase 0 points 0 points 0 points 0 points  Total Score 0 points 0 points 0 points     Immunizations Immunization History  Administered Date(s) Administered   Fluad Quad(high Dose 65+) 10/03/2018, 11/27/2019, 11/15/2020   Hep A / Hep B 04/14/2010, 05/19/2010, 10/27/2010   Influenza Split 10/27/2010   Influenza, High Dose Seasonal PF 12/20/2016   Influenza,inj,Quad PF,6+ Mos 12/12/2015   PFIZER(Purple Top)SARS-COV-2 Vaccination 03/14/2019, 04/06/2019, 11/01/2019   Pneumococcal Conjugate-13 07/09/2016   Pneumococcal Polysaccharide-23 10/27/2004, 04/14/2010, 07/24/2019   Td 07/08/2004   Tdap 09/24/2014    TDAP status: Up to date  Flu Vaccine status: Due, Education has been provided regarding the importance of this vaccine. Advised may receive this vaccine at local pharmacy or Health Dept. Aware to provide a copy of the vaccination record if obtained from local pharmacy or Health Dept. Verbalized acceptance and understanding.  Pneumococcal vaccine status: Up to date  Covid-19 vaccine status: Declined, Education has been provided regarding the importance of this vaccine but patient still declined. Advised may receive this vaccine at local pharmacy or Health Dept.or vaccine clinic. Aware to provide a copy of the vaccination record if obtained from local pharmacy or Health Dept. Verbalized acceptance and understanding.  Qualifies for Shingles Vaccine? Yes   Zostavax completed No   Shingrix Completed?: No.    Education has been provided regarding the importance of this vaccine. Patient has been advised to call insurance company to determine out of pocket expense if they have not yet received this  vaccine. Advised may also receive vaccine at local pharmacy or Health Dept. Verbalized acceptance and understanding.  Screening Tests Health Maintenance  Topic Date Due   FOOT EXAM  08/01/2021   OPHTHALMOLOGY EXAM  09/21/2021   MAMMOGRAM  03/07/2022   Diabetic kidney evaluation - eGFR measurement  08/15/2022   Diabetic kidney evaluation - Urine ACR  08/15/2022   HEMOGLOBIN A1C  08/21/2022   INFLUENZA VACCINE  09/06/2022   COVID-19 Vaccine (4 - 2023-24 season) 10/07/2022   Zoster Vaccines- Shingrix (1 of 2) 02/08/2023 (Originally 04/02/1969)   Medicare Annual Wellness (AWV)  10/12/2023   DTaP/Tdap/Td (3 - Td or Tdap) 09/23/2024   Colonoscopy  05/21/2027   Pneumonia Vaccine 50+ Years old  Completed   DEXA SCAN  Completed   Hepatitis C Screening  Completed   HPV VACCINES  Aged Out    Health Maintenance  Health Maintenance Due  Topic Date Due   FOOT EXAM  08/01/2021   OPHTHALMOLOGY EXAM  09/21/2021   MAMMOGRAM  03/07/2022   Diabetic kidney evaluation - eGFR measurement  08/15/2022   Diabetic kidney evaluation - Urine ACR  08/15/2022   HEMOGLOBIN A1C  08/21/2022   INFLUENZA VACCINE  09/06/2022   COVID-19 Vaccine (4 - 2023-24 season) 10/07/2022    Colorectal cancer screening: Type of screening: Colonoscopy. Completed 05/21/22. Repeat every 5 years  Mammogram status: Ordered Patient deferred. Pt provided with contact info and advised to call to schedule appt.   Bone Density status: Completed 09/17/14. Results reflect: Bone density results: OSTEOPENIA. Repeat every   years.  Lung Cancer Screening: (Low Dose CT Chest recommended if Age 62-80 years, 20 pack-year currently smoking OR have quit w/in 15years.) does not qualify.     Additional Screening:  Hepatitis C Screening: does qualify; Completed 06/20/12  Vision Screening: Recommended annual ophthalmology exams for early detection of glaucoma and other disorders of the eye. Is the patient up to date with their annual eye exam?   Yes  Who is the provider or what is the name of the office in which the patient attends annual eye exams? [facility] If pt is not established with a provider, would they like to be referred to a provider to establish care? No .   Dental Screening: Recommended annual dental exams for proper oral hygiene  Diabetic Foot Exam: Diabetic Foot Exam: Overdue, Pt has been advised about the importance in completing this exam. Pt is scheduled for diabetic foot exam on Followed by PCP.  Community Resource Referral / Chronic Care Management:  CRR required this visit?  No   CCM required this visit?  No     Plan:     I have personally reviewed and noted the following in the patient's chart:   Medical and social history Use of alcohol, tobacco or illicit drugs  Current medications and supplements including opioid prescriptions. Patient is not currently taking opioid prescriptions. Functional ability and status Nutritional status Physical activity Advanced directives List of other physicians Hospitalizations, surgeries, and ER visits in previous 12 months Vitals Screenings to include cognitive, depression, and falls Referrals and appointments  In addition, I have reviewed and discussed with patient certain preventive protocols, quality metrics, and best practice recommendations. A written personalized care plan for preventive services as well as general preventive health recommendations were provided to patient.     Tillie Rung, LPN   08/06/5364   After Visit Summary: (MyChart) Due to this being a telephonic visit, the after visit summary with patients personalized plan was offered to patient via MyChart   Nurse Notes: None

## 2022-10-12 NOTE — Patient Instructions (Addendum)
Ms. Stankowski , Thank you for taking time to come for your Medicare Wellness Visit. I appreciate your ongoing commitment to your health goals. Please review the following plan we discussed and let me know if I can assist you in the future.   Referrals/Orders/Follow-Ups/Clinician Recommendations:   This is a list of the screening recommended for you and due dates:  Health Maintenance  Topic Date Due   Complete foot exam   08/01/2021   Eye exam for diabetics  09/21/2021   Mammogram  03/07/2022   Yearly kidney function blood test for diabetes  08/15/2022   Yearly kidney health urinalysis for diabetes  08/15/2022   Hemoglobin A1C  08/21/2022   Flu Shot  09/06/2022   COVID-19 Vaccine (4 - 2023-24 season) 10/07/2022   Zoster (Shingles) Vaccine (1 of 2) 02/08/2023*   Medicare Annual Wellness Visit  10/12/2023   DTaP/Tdap/Td vaccine (3 - Td or Tdap) 09/23/2024   Colon Cancer Screening  05/21/2027   Pneumonia Vaccine  Completed   DEXA scan (bone density measurement)  Completed   Hepatitis C Screening  Completed   HPV Vaccine  Aged Out  *Topic was postponed. The date shown is not the original due date.    Advanced directives: (Copy Requested) Please bring a copy of your health care power of attorney and living will to the office to be added to your chart at your convenience.  Next Medicare Annual Wellness Visit scheduled for next year: Yes

## 2022-10-16 NOTE — Progress Notes (Unsigned)
No chief complaint on file.   HPI: Patient  Morgan [name]  72 y.o. comes in today for Preventive Health Care visit  And med disease evaluation DM HT   Health Maintenance  Topic Date Due   FOOT EXAM  08/01/2021   OPHTHALMOLOGY EXAM  09/21/2021   MAMMOGRAM  03/07/2022   Diabetic kidney evaluation - eGFR measurement  08/15/2022   Diabetic kidney evaluation - Urine ACR  08/15/2022   HEMOGLOBIN A1C  08/21/2022   INFLUENZA VACCINE  09/06/2022   COVID-19 Vaccine (4 - 2023-24 season) 10/07/2022   Zoster Vaccines- Shingrix (1 of 2) 02/08/2023 (Originally 04/02/1969)   Medicare Annual Wellness (AWV)  10/12/2023   DTaP/Tdap/Td (3 - Td or Tdap) 09/23/2024   Colonoscopy  05/21/2027   Pneumonia Vaccine 51+ Years old  Completed   DEXA SCAN  Completed   Hepatitis C Screening  Completed   HPV VACCINES  Aged Out   Health Maintenance Review LIFESTYLE:  Exercise:   Tobacco/ETS: Alcohol:  Sugar beverages: Sleep: Drug use: no HH of  Work:    ROS:  GEN/ HEENT: No fever, significant weight changes sweats headaches vision problems hearing changes, CV/ PULM; No chest pain shortness of breath cough, syncope,edema  change in exercise tolerance. GI /GU: No adominal pain, vomiting, change in bowel habits. No blood in the stool. No significant GU symptoms. SKIN/HEME: ,no acute skin rashes suspicious lesions or bleeding. No lymphadenopathy, nodules, masses.  NEURO/ PSYCH:  No neurologic signs such as weakness numbness. No depression anxiety. IMM/ Allergy: No unusual infections.  Allergy .   REST of 12 system review negative except as per HPI   Past Medical History:  Diagnosis Date   Anemia    as a child    CAD, NATIVE VESSEL 05/13/2009   Cataracts, bilateral    MD just watching   Complication of anesthesia    DIABETES MELLITUS, TYPE II 12/09/2009   Gastric ulcer    Gastritis    GERD (gastroesophageal reflux disease)    Hx of abnormal cervical Pap smear    cryo  but normal  since then    HYPERLIPIDEMIA-MIXED 04/14/2008   Intestinal metaplasia of gastric mucosa 2012   Myocardial infarction (HCC)    2003   Obesity    Occult blood in stools    Osteopenia    OVERWEIGHT/OBESITY 05/13/2009   Thyroid disease    Ulcer    Duodenal ulcer by x-ray 40 years ago   Varicose veins    under rx [facility] 2016    Past Surgical History:  Procedure Laterality Date   BIOPSY  09/29/2018   Procedure: BIOPSY;  Surgeon: Lemar Lofty., MD;  Location: [facility] ENDOSCOPY;  Service: Gastroenterology;;   BIOPSY  01/14/2020   Procedure: BIOPSY;  Surgeon: Lemar Lofty., MD;  Location: [facility] ENDOSCOPY;  Service: Gastroenterology;;   CESAREAN SECTION     x2   CLOSED REDUCTION PROXIMAL TIBIOFIBULAR JOINT DISLOCATON  2004   left   COLONOSCOPY     COLONOSCOPY WITH PROPOFOL N/A 09/29/2018   Procedure: COLONOSCOPY WITH PROPOFOL;  Surgeon: Lemar Lofty., MD;  Location: [facility] ENDOSCOPY;  Service: Gastroenterology;  Laterality: N/A;   DILATION AND CURETTAGE OF UTERUS     ECTOPIC PREGNANCY SURGERY     ESOPHAGOGASTRODUODENOSCOPY N/A 02/12/2018   Procedure: ESOPHAGOGASTRODUODENOSCOPY (EGD);  Surgeon: Lemar Lofty., MD;  Location: [facility] ENDOSCOPY;  Service: Gastroenterology;  Laterality: N/A;   ESOPHAGOGASTRODUODENOSCOPY N/A 09/29/2018   Procedure: ESOPHAGOGASTRODUODENOSCOPY (EGD);  Surgeon: Lemar Lofty.,  MD;  Location: [facility] ENDOSCOPY;  Service: Gastroenterology;  Laterality: N/A;   ESOPHAGOGASTRODUODENOSCOPY (EGD) WITH PROPOFOL N/A 01/14/2020   Procedure: ESOPHAGOGASTRODUODENOSCOPY (EGD) WITH PROPOFOL;  Surgeon: Meridee Score Netty Starring., MD;  Location: [facility] ENDOSCOPY;  Service: Gastroenterology;  Laterality: N/A;   EUS N/A 02/12/2018   Procedure: UPPER ENDOSCOPIC ULTRASOUND (EUS) RADIAL;  Surgeon: Meridee Score Netty Starring., MD;  Location: [facility] ENDOSCOPY;  Service: Gastroenterology;  Laterality: N/A;   FIBULA FRACTURE SURGERY     GANGLION CYST EXCISION     POLYPECTOMY   02/12/2018   Procedure: POLYPECTOMY;  Surgeon: Mansouraty, Netty Starring., MD;  Location: [facility] ENDOSCOPY;  Service: Gastroenterology;;   POLYPECTOMY  01/14/2020   Procedure: POLYPECTOMY;  Surgeon: Lemar Lofty., MD;  Location[facility] ENDOSCOPY;  Service: Gastroenterology;;   TONSILLECTOMY     TUBAL LIGATION     One Tube   UPPER GASTROINTESTINAL ENDOSCOPY     08/27/17    Family History  Problem Relation Age of Onset   Emphysema Mother        Smoker   Heart attack Mother        due to head injury   Alcohol abuse Mother    Heart disease Mother    Early death Mother    Varicose Veins Mother    AAA (abdominal aortic aneurysm) Mother    Lung cancer Father    Heart attack Father    Hypertension Father    Alcohol abuse Father    Heart disease Father        before age 29   Aneurysm Father    Hyperlipidemia Father    Alcohol abuse Brother    Diabetes Brother    Heart disease Brother    Heart attack Brother    Diabetes Brother    Aneurysm Paternal Grandmother    Diabetes Other        grandfather   Sudden Cardiac Death Brother    Colon cancer Neg Hx    Esophageal cancer Neg Hx    Stomach cancer Neg Hx    Inflammatory bowel disease Neg Hx    Liver disease Neg Hx    Pancreatic cancer Neg Hx    Rectal cancer Neg Hx     Social History   Socioeconomic History   Marital status: Widowed    Spouse name: Not on file   Number of children: 2   Years of education: phd    Highest education level: Not on file  Occupational History   Occupation: Mental Health Counselor    Comment: [employer]   Occupation: Glass blower/designer: [employer]  Tobacco Use   Smoking status: Former    Current packs/day: 0.00    Types: Cigarettes    Quit date: 02/05/1994    Years since quitting: 28.7   Smokeless tobacco: Never  Vaping Use   Vaping status: Never Used  Substance and Sexual Activity   Alcohol use: No    Alcohol/week: 0.0 standard drinks of alcohol   Drug use: No    Sexual activity: Not on file  Other Topics Concern   Not on file  Social History Narrative   HH of 2 currently    No tobacco    Separated     Recently widowed   5 14       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    No firearms  NO reg exercise    Pets- Poodles 2    Sleep 7 hours    Social Determinants of Health   Financial Resource Strain: Low Risk  (10/12/2022)   Overall Financial Resource Strain (CARDIA)    Difficulty of Paying Living Expenses: Not hard at all  Food Insecurity: No Food Insecurity (10/12/2022)   Hunger Vital Sign    Worried About Running Out of Food in the Last Year: Never true    Ran Out of Food in the Last Year: Never true  Transportation Needs: No Transportation Needs (10/12/2022)   PRAPARE - Administrator, Civil Service (Medical): No    Lack of Transportation (Non-Medical): No  Physical Activity: Insufficiently Active (10/12/2022)   Exercise Vital Sign    Days of Exercise per Week: 1 day    Minutes of Exercise per Session: 30 min  Stress: No Stress Concern Present (10/12/2022)   [employer] of Occupational Health - Occupational Stress Questionnaire    Feeling of Stress : Not at all  Social Connections: Moderately Integrated (10/12/2022)   Social Connection and Isolation Panel [NHANES]    Frequency of Communication with Friends and Family: More than three times a week    Frequency of Social Gatherings with Friends and Family: Twice a week    Attends Religious Services: More than 4 times per year    Active Member of [employer] or Organizations: Yes    Attends Banker Meetings: 1 to 4 times per year    Marital Status: Widowed    Outpatient Medications Prior to Visit  Medication Sig Dispense Refill   acetaminophen (TYLENOL) 500 MG tablet Take 1,000 mg by mouth every 6 (six) hours as needed for moderate pain or headache.     aspirin 81 MG tablet  Take 81 mg by mouth daily.       atorvastatin (LIPITOR) 80 MG tablet TAKE 1 TABLET BY MOUTH ONCE DAILY AT 6PM 90 tablet 0   Cholecalciferol (VITAMIN D3) 125 MCG (5000 UT) CAPS Take 5,000 Units by mouth daily.     clindamycin (CLEOCIN T) 1 % lotion Apply 1 application  topically daily.     Cranberry-Vitamin C-Vitamin E (CRANBERRY PLUS VITAMIN C) 4200-20-3 MG-MG-UNIT CAPS Take 2 capsules by mouth daily.     doxycycline (VIBRA-TABS) 100 MG tablet Take 1 tablet (100 mg total) by mouth 2 (two) times daily. (Patient not taking: Reported on 05/21/2022) 20 tablet 0   escitalopram (LEXAPRO) 10 MG tablet Take 1 tablet (10 mg total) by mouth daily. 30 tablet 1   ezetimibe (ZETIA) 10 MG tablet TAKE 1 TABLET BY MOUTH EVERY DAY 90 tablet 1   Ferrous Sulfate (IRON) 325 (65 Fe) MG TABS Take 650 mg by mouth daily.      fluticasone (FLONASE) 50 MCG/ACT nasal spray Place 2 sprays into both nostrils daily. (Patient not taking: Reported on 05/21/2022) 16 g 0   INVOKANA 300 MG TABS tablet TAKE 1 TABLET BY MOUTH EVERY DAY BEFORE BREAKFAST 90 tablet 1   Lancet Devices (ONE TOUCH DELICA LANCING DEV) MISC Use as directed to check blood sugars twice to four time per day. Dx E11.9 1 each 11   Lancets (ONETOUCH DELICA PLUS LANCET33G) MISC USE TO check blood sugar 2-3 TIMES A DAY 100 each 1   levothyroxine (SYNTHROID) 50 MCG tablet TAKE 1 TABLET BY MOUTH EVERY DAY 90 tablet 0   LORazepam (ATIVAN) 0.5 MG tablet Take 1-2 tabs    ( 0.5-1 mg) pre procedure  8 tablet 0   metFORMIN (GLUCOPHAGE-XR) 500 MG 24 hr tablet TAKE 2 TABLETS BY MOUTH TWICE DAILY WITH MEALS 360 tablet 0   Multiple Vitamins-Minerals (MULTIVITAMIN PO) Take 1 tablet by mouth daily.      Omega-3 Fatty Acids (FISH OIL) 1000 MG CAPS Take 2,000 mg by mouth daily.     omeprazole (PRILOSEC) 40 MG capsule TAKE 1 CAPSULE BY MOUTH 2 TIMES DAILY 180 capsule 0   ONETOUCH ULTRA test strip USE TO check blood sugar 2-3 TIMES A DAY 100 strip 1   PFIZER-BIONT COVID-19 VAC-TRIS  SUSP injection      Semaglutide, 2 MG/DOSE, (OZEMPIC, 2 MG/DOSE,) 8 MG/3ML SOPN Inject 2 mg as directed once a week. 3 mL 2   tretinoin (RETIN-A) 0.025 % cream Apply topically. (Patient not taking: Reported on 05/21/2022)     vitamin B-12 (CYANOCOBALAMIN) 1000 MCG tablet Take 2,000 mcg by mouth daily.     No facility-administered medications prior to visit.     EXAM:  LMP  (LMP Unknown)   There is no height or weight on file to calculate BMI. Wt Readings from Last 3 Encounters:  10/12/22 156 lb (70.8 kg)  05/21/22 156 lb (70.8 kg)  04/16/22 156 lb (70.8 kg)    Physical Exam: Vital signs reviewed ZOX:WRUE is a well-developed well-nourished alert cooperative    who appearsr stated age in no acute distress.  HEENT: normocephalic atraumatic , Eyes: PERRL EOM's full, conjunctiva clear, Nares: paten,t no deformity discharge or tenderness., Ears: no deformity EAC's clear TMs with normal landmarks. Mouth: clear OP, no lesions, edema.  Moist mucous membranes. Dentition in adequate repair. NECK: supple without masses, thyromegaly or bruits. CHEST/PULM:  Clear to auscultation and percussion breath sounds equal no wheeze , rales or rhonchi. No chest wall deformities or tenderness. Breast: normal by inspection . No dimpling, discharge, masses, tenderness or discharge . CV: PMI is nondisplaced, S1 S2 no gallops, murmurs, rubs. Peripheral pulses are full without delay.No JVD .  ABDOMEN: Bowel sounds normal nontender  No guard or rebound, no hepato splenomegal no CVA tenderness.  No hernia. Extremtities:  No clubbing cyanosis or edema, no acute joint swelling or redness no focal atrophy NEURO:  Oriented x3, cranial nerves 3-12 appear to be intact, no obvious focal weakness,gait within normal limits no abnormal reflexes or asymmetrical SKIN: No acute rashes normal turgor, color, no bruising or petechiae. PSYCH: Oriented, good eye contact, no obvious depression anxiety, cognition and judgment appear  normal. LN: no cervical axillary inguinal adenopathy  Lab Results  Component Value Date   WBC 6.3 08/14/2021   HGB 14.9 08/14/2021   HCT 43.8 08/14/2021   PLT 198.0 08/14/2021   GLUCOSE 188 (H) 08/14/2021   CHOL 142 08/14/2021   TRIG 136.0 08/14/2021   HDL 44.20 08/14/2021   LDLCALC 71 08/14/2021   ALT 25 08/14/2021   AST 19 08/14/2021   NA 133 (L) 08/14/2021   K 4.3 08/14/2021   CL 95 (L) 08/14/2021   CREATININE 0.70 08/14/2021   BUN 15 08/14/2021   CO2 28 08/14/2021   TSH 2.83 08/14/2021   INR 0.9 01/13/2018   HGBA1C 8.3 (A) 02/20/2022   MICROALBUR <0.7 08/14/2021    BP Readings from Last 3 Encounters:  05/21/22 (!) 115/55  04/16/22 118/68  02/20/22 120/70    Lab results reviewed with patient   ASSESSMENT AND PLAN:  Discussed the following assessment and plan:    ICD-10-CM   1. Medication management  [phone]  2. Visit for preventive health examination  Z00.00     3. Controlled type 2 diabetes mellitus with complication, without long-term current use of insulin (HCC)  E11.8     4. Mixed hyperlipidemia  E78.2     5. Subclinical hypothyroidism  E03.8      No follow-ups on file.  Patient Care Team: Lateshia Schmoker, Neta Mends, MD as PCP - General (Internal Medicine) Jens Som Madolyn Frieze, MD as PCP - Cardiology (Cardiology) [place], Amy, MD as Consulting Physician (Dermatology) Marcelle Overlie, MD as Attending Physician (Obstetrics and Gynecology) Jens Som Madolyn Frieze, MD as Consulting Physician (Cardiology) Napoleon Form, MD as Consulting Physician (Gastroenterology) Lowanda Foster, MD (Psychiatry) There are no Patient Instructions on file for this visit.  Neta Mends. Merced Brougham M.D.

## 2022-10-17 ENCOUNTER — Encounter: Payer: Self-pay | Admitting: Internal Medicine

## 2022-10-17 ENCOUNTER — Ambulatory Visit (INDEPENDENT_AMBULATORY_CARE_PROVIDER_SITE_OTHER): Payer: PPO | Admitting: Internal Medicine

## 2022-10-17 VITALS — BP 118/70 | HR 56 | Temp 97.8°F | Ht 65.0 in | Wt 160.2 lb

## 2022-10-17 DIAGNOSIS — E038 Other specified hypothyroidism: Secondary | ICD-10-CM

## 2022-10-17 DIAGNOSIS — Z79899 Other long term (current) drug therapy: Secondary | ICD-10-CM

## 2022-10-17 DIAGNOSIS — E782 Mixed hyperlipidemia: Secondary | ICD-10-CM

## 2022-10-17 DIAGNOSIS — E1165 Type 2 diabetes mellitus with hyperglycemia: Secondary | ICD-10-CM

## 2022-10-17 DIAGNOSIS — Z23 Encounter for immunization: Secondary | ICD-10-CM

## 2022-10-17 DIAGNOSIS — Z7984 Long term (current) use of oral hypoglycemic drugs: Secondary | ICD-10-CM

## 2022-10-17 DIAGNOSIS — Z7985 Long-term (current) use of injectable non-insulin antidiabetic drugs: Secondary | ICD-10-CM | POA: Diagnosis not present

## 2022-10-17 DIAGNOSIS — I2510 Atherosclerotic heart disease of native coronary artery without angina pectoris: Secondary | ICD-10-CM

## 2022-10-17 DIAGNOSIS — E118 Type 2 diabetes mellitus with unspecified complications: Secondary | ICD-10-CM

## 2022-10-17 DIAGNOSIS — Z0000 Encounter for general adult medical examination without abnormal findings: Secondary | ICD-10-CM

## 2022-10-17 LAB — CBC WITH DIFFERENTIAL/PLATELET
Basophils Absolute: 0 10*3/uL (ref 0.0–0.1)
Basophils Relative: 0.4 % (ref 0.0–3.0)
Eosinophils Absolute: 0.1 10*3/uL (ref 0.0–0.7)
Eosinophils Relative: 1.5 % (ref 0.0–5.0)
HCT: 45.6 % (ref 36.0–46.0)
Hemoglobin: 15.1 g/dL — ABNORMAL HIGH (ref 12.0–15.0)
Lymphocytes Relative: 26.7 % (ref 12.0–46.0)
Lymphs Abs: 2.2 10*3/uL (ref 0.7–4.0)
MCHC: 33 g/dL (ref 30.0–36.0)
MCV: 94.2 fl (ref 78.0–100.0)
Monocytes Absolute: 0.5 10*3/uL (ref 0.1–1.0)
Monocytes Relative: 5.8 % (ref 3.0–12.0)
Neutro Abs: 5.3 10*3/uL (ref 1.4–7.7)
Neutrophils Relative %: 65.6 % (ref 43.0–77.0)
Platelets: 217 10*3/uL (ref 150.0–400.0)
RBC: 4.84 Mil/uL (ref 3.87–5.11)
RDW: 13.1 % (ref 11.5–15.5)
WBC: 8.1 10*3/uL (ref 4.0–10.5)

## 2022-10-17 LAB — LIPID PANEL
Cholesterol: 129 mg/dL (ref 0–200)
HDL: 54.5 mg/dL (ref >39.00)
LDL Cholesterol: 58 mg/dL (ref 0–99)
NonHDL: 74.83
Total CHOL/HDL Ratio: 2
Triglycerides: 86 mg/dL (ref 0.0–149.0)
VLDL: 17.2 mg/dL (ref 0.0–40.0)

## 2022-10-17 LAB — COMPREHENSIVE METABOLIC PANEL
ALT: 27 U/L (ref 0–35)
AST: 23 U/L (ref 0–37)
Albumin: 4.4 g/dL (ref 3.5–5.2)
Alkaline Phosphatase: 64 U/L (ref 39–117)
BUN: 10 mg/dL (ref 6–23)
CO2: 29 meq/L (ref 19–32)
Calcium: 10 mg/dL (ref 8.4–10.5)
Chloride: 96 meq/L (ref 96–112)
Creatinine, Ser: 0.67 mg/dL (ref 0.40–1.20)
GFR: 87.86 mL/min (ref >60.00)
Glucose, Bld: 141 mg/dL — ABNORMAL HIGH (ref 70–99)
Potassium: 4.5 meq/L (ref 3.5–5.1)
Sodium: 133 meq/L — ABNORMAL LOW (ref 135–145)
Total Bilirubin: 1 mg/dL (ref 0.2–1.2)
Total Protein: 6.8 g/dL (ref 6.0–8.3)

## 2022-10-17 LAB — MICROALBUMIN / CREATININE URINE RATIO
Creatinine,U: 21.3 mg/dL
Microalb Creat Ratio: 3.3 mg/g (ref 0.0–30.0)
Microalb, Ur: <0.7 mg/dL (ref 0.0–1.9)

## 2022-10-17 LAB — TSH: TSH: 3.31 u[IU]/mL (ref 0.35–5.50)

## 2022-10-17 LAB — HEMOGLOBIN A1C: Hgb A1c MFr Bld: 8.5 % — ABNORMAL HIGH (ref 4.6–6.5)

## 2022-10-17 MED ORDER — ATORVASTATIN CALCIUM 80 MG PO TABS: ORAL_TABLET | ORAL | 3 refills | Status: DC

## 2022-10-17 NOTE — Addendum Note (Signed)
Addended byVickii Chafe on: 10/17/2022 10:39 AM   Modules accepted: Orders

## 2022-10-17 NOTE — Patient Instructions (Signed)
Glad you are doing well.  Non fasting labs today  will share with Dr Jens Som . Optimize physical activity .

## 2022-10-22 NOTE — Progress Notes (Signed)
the A1c is  still above 8  as you expected  we can try changing to mounjaro instead of ozempic. ( Dont know if will be better)  and or add   a dose of long acting insulin per day . OK to wait until you see endo if you wish

## 2022-10-23 ENCOUNTER — Telehealth: Payer: Self-pay | Admitting: Internal Medicine

## 2022-10-23 NOTE — Telephone Encounter (Signed)
Pt called, returning CMA's call. CMA was with a patient Pt asked that CMA call back at her earliest convenience.

## 2022-10-24 NOTE — Telephone Encounter (Signed)
Spoke to pt. See lab result note.

## 2022-11-05 ENCOUNTER — Other Ambulatory Visit (HOSPITAL_BASED_OUTPATIENT_CLINIC_OR_DEPARTMENT_OTHER): Payer: Self-pay

## 2022-11-05 ENCOUNTER — Other Ambulatory Visit: Payer: Self-pay

## 2022-11-05 MED ORDER — TIRZEPATIDE 2.5 MG/0.5ML ~~LOC~~ SOAJ
2.5000 mg | SUBCUTANEOUS | 0 refills | Status: DC
  Filled 2022-11-05 – 2022-11-08 (×2): qty 2, 28d supply, fill #0

## 2022-11-07 ENCOUNTER — Other Ambulatory Visit (HOSPITAL_BASED_OUTPATIENT_CLINIC_OR_DEPARTMENT_OTHER): Payer: Self-pay

## 2022-11-08 ENCOUNTER — Other Ambulatory Visit (HOSPITAL_BASED_OUTPATIENT_CLINIC_OR_DEPARTMENT_OTHER): Payer: Self-pay

## 2022-11-08 ENCOUNTER — Telehealth: Payer: Self-pay | Admitting: Internal Medicine

## 2022-11-08 NOTE — Telephone Encounter (Signed)
PA was sent to Covermymeds today.  And approved.   Madelaine Etienne (Key: [id number]) PA Case ID #: [id number] Rx #: [id number]  Outcome: Approved today by [employer] 2017 03-OCT-24:03-OCT-25 Mounjaro 2.5MG /0.5ML [place] SOAJ Quantity:2;

## 2022-11-08 NOTE — Telephone Encounter (Signed)
Reach out to [facility] pharmacy and update them. Spoke to [place], she states the Rx went through with $0 co-pay. It will be ready for pt to be pick up after 11 am today.    Contacted pt and update her of inform above. Pt thank for the quick call and good news. No further action is needed at this time.

## 2022-11-08 NOTE — Telephone Encounter (Signed)
Prescription Request  11/08/2022  LOV: 10/17/2022  What is the name of the medication or equipment? tirzepatide tirzepatide [facility]) 2.5 MG/0.5ML Pen  *Pt is completely out of her medication. *Pt states Pharmacy is waiting for the approval. *Pt added that she is leaving town tomorrow and needs this Rx today.  Have you contacted your pharmacy to request a refill? Yes   Which pharmacy would you like this sent to? [facility] Phone: [phone]  Fax: [phone]       Patient notified that their request is being sent to the clinical staff for review and that they should receive a response within 2-3 business days.   Please advise at Mobile [phone] (mobile)

## 2022-11-15 ENCOUNTER — Other Ambulatory Visit: Payer: Self-pay | Admitting: Internal Medicine

## 2022-11-19 ENCOUNTER — Other Ambulatory Visit (HOSPITAL_BASED_OUTPATIENT_CLINIC_OR_DEPARTMENT_OTHER): Payer: Self-pay

## 2022-11-19 DIAGNOSIS — K219 Gastro-esophageal reflux disease without esophagitis: Secondary | ICD-10-CM | POA: Diagnosis not present

## 2022-11-19 DIAGNOSIS — E039 Hypothyroidism, unspecified: Secondary | ICD-10-CM | POA: Diagnosis not present

## 2022-11-19 DIAGNOSIS — Z6826 Body mass index (BMI) 26.0-26.9, adult: Secondary | ICD-10-CM | POA: Diagnosis not present

## 2022-11-19 DIAGNOSIS — Z124 Encounter for screening for malignant neoplasm of cervix: Secondary | ICD-10-CM | POA: Diagnosis not present

## 2022-11-19 DIAGNOSIS — N958 Other specified menopausal and perimenopausal disorders: Secondary | ICD-10-CM | POA: Diagnosis not present

## 2022-11-19 DIAGNOSIS — Z1231 Encounter for screening mammogram for malignant neoplasm of breast: Secondary | ICD-10-CM | POA: Diagnosis not present

## 2022-11-20 ENCOUNTER — Other Ambulatory Visit: Payer: Self-pay | Admitting: Cardiology

## 2022-11-20 DIAGNOSIS — L700 Acne vulgaris: Secondary | ICD-10-CM | POA: Diagnosis not present

## 2022-11-20 DIAGNOSIS — L821 Other seborrheic keratosis: Secondary | ICD-10-CM | POA: Diagnosis not present

## 2022-11-20 DIAGNOSIS — L819 Disorder of pigmentation, unspecified: Secondary | ICD-10-CM | POA: Diagnosis not present

## 2022-11-29 ENCOUNTER — Other Ambulatory Visit: Payer: Self-pay

## 2022-11-29 MED ORDER — OMEPRAZOLE 40 MG PO CPDR: 40.0000 mg | DELAYED_RELEASE_CAPSULE | Freq: Two times a day (BID) | ORAL | 0 refills | Status: DC

## 2022-11-29 MED ORDER — LEVOTHYROXINE SODIUM 50 MCG PO TABS: 50.0000 ug | ORAL_TABLET | Freq: Every day | ORAL | 2 refills | Status: DC

## 2022-11-29 MED ORDER — METFORMIN HCL ER 500 MG PO TB24: ORAL_TABLET | ORAL | 2 refills | Status: DC

## 2022-11-30 ENCOUNTER — Encounter: Payer: Self-pay | Admitting: Internal Medicine

## 2022-12-03 ENCOUNTER — Other Ambulatory Visit (HOSPITAL_BASED_OUTPATIENT_CLINIC_OR_DEPARTMENT_OTHER): Payer: Self-pay

## 2022-12-03 MED ORDER — MOUNJARO 5 MG/0.5ML ~~LOC~~ SOAJ
5.0000 mg | SUBCUTANEOUS | 0 refills | Status: DC
  Filled 2022-12-03: qty 2, 28d supply, fill #0

## 2022-12-04 ENCOUNTER — Other Ambulatory Visit: Payer: Self-pay | Admitting: Internal Medicine

## 2022-12-12 ENCOUNTER — Telehealth: Payer: Self-pay

## 2022-12-12 NOTE — Telephone Encounter (Signed)
Attempted to reach pt regarding to [facility] Ultra Strips.   It was sent in on 11/15/2022 to [facility].   Received a fax from [facility].   Wants to clarify with pt. Left a voicemail to call us back.

## 2022-12-13 NOTE — Telephone Encounter (Signed)
Pt returning call

## 2022-12-17 NOTE — Telephone Encounter (Signed)
Attempted to reach pt. Left a voicemail to call us back.  

## 2022-12-17 NOTE — Telephone Encounter (Signed)
Spoke to pt.  Pt states Rx is good at where it is at right now.   Will be seeing endo next Monday. Sure not if she would need it. But will let us know if she needs [place] to send the Rx to [facility].

## 2022-12-21 ENCOUNTER — Encounter: Payer: Self-pay | Admitting: Gastroenterology

## 2022-12-24 ENCOUNTER — Encounter: Payer: Self-pay | Admitting: Internal Medicine

## 2022-12-24 ENCOUNTER — Other Ambulatory Visit (HOSPITAL_BASED_OUTPATIENT_CLINIC_OR_DEPARTMENT_OTHER): Payer: Self-pay

## 2022-12-24 ENCOUNTER — Other Ambulatory Visit: Payer: Self-pay

## 2022-12-24 ENCOUNTER — Ambulatory Visit: Payer: PPO | Admitting: Internal Medicine

## 2022-12-24 VITALS — BP 124/70 | HR 62 | Ht 65.0 in | Wt 160.0 lb

## 2022-12-24 DIAGNOSIS — E119 Type 2 diabetes mellitus without complications: Secondary | ICD-10-CM | POA: Insufficient documentation

## 2022-12-24 DIAGNOSIS — E1159 Type 2 diabetes mellitus with other circulatory complications: Secondary | ICD-10-CM

## 2022-12-24 DIAGNOSIS — E1165 Type 2 diabetes mellitus with hyperglycemia: Secondary | ICD-10-CM | POA: Insufficient documentation

## 2022-12-24 DIAGNOSIS — Z7985 Long-term (current) use of injectable non-insulin antidiabetic drugs: Secondary | ICD-10-CM

## 2022-12-24 DIAGNOSIS — Z7984 Long term (current) use of oral hypoglycemic drugs: Secondary | ICD-10-CM

## 2022-12-24 DIAGNOSIS — E785 Hyperlipidemia, unspecified: Secondary | ICD-10-CM | POA: Insufficient documentation

## 2022-12-24 LAB — POCT GLYCOSYLATED HEMOGLOBIN (HGB A1C): Hemoglobin A1C: 8.8 % — AB (ref 4.0–5.6)

## 2022-12-24 MED ORDER — CANAGLIFLOZIN 300 MG PO TABS: 300.0000 mg | ORAL_TABLET | Freq: Every day | ORAL | 2 refills | Status: DC

## 2022-12-24 MED ORDER — TIRZEPATIDE 7.5 MG/0.5ML ~~LOC~~ SOAJ
7.5000 mg | SUBCUTANEOUS | 2 refills | Status: DC
  Filled 2022-12-24: qty 6, 84d supply, fill #0

## 2022-12-24 MED ORDER — GLIMEPIRIDE 1 MG PO TABS: 1.0000 mg | ORAL_TABLET | Freq: Every day | ORAL | 2 refills | Status: DC

## 2022-12-24 MED ORDER — CANAGLIFLOZIN 300 MG PO TABS
300.0000 mg | ORAL_TABLET | Freq: Every day | ORAL | 2 refills | Status: DC
  Filled 2022-12-24: qty 90, 90d supply, fill #0

## 2022-12-24 MED ORDER — METFORMIN HCL ER 500 MG PO TB24
1000.0000 mg | ORAL_TABLET | Freq: Two times a day (BID) | ORAL | 2 refills | Status: DC
  Filled 2022-12-24: qty 360, 90d supply, fill #0

## 2022-12-24 MED ORDER — GLIMEPIRIDE 1 MG PO TABS
1.0000 mg | ORAL_TABLET | Freq: Every day | ORAL | 2 refills | Status: DC
  Filled 2022-12-24: qty 90, 90d supply, fill #0

## 2022-12-24 NOTE — Telephone Encounter (Signed)
No problem.

## 2022-12-24 NOTE — Patient Instructions (Signed)
Start Glimepiride 1 mg , 1 tablet before Breakfast  Continue Metformin 500 mg XR, 2 tablets twice a day  Invokana 300 mg, 1 tablet every morning  Increase Mounjaro 7.5 mg weekly     HOW TO TREAT LOW BLOOD SUGARS (Blood sugar LESS THAN 70 MG/DL) Please follow the RULE OF 15 for the treatment of hypoglycemia treatment (when your (blood sugars are less than 70 mg/dL)   STEP 1: Take 15 grams of carbohydrates when your blood sugar is low, which includes:  3-4 GLUCOSE TABS  OR 3-4 OZ OF JUICE OR REGULAR SODA OR ONE TUBE OF GLUCOSE GEL    STEP 2: RECHECK blood sugar in 15 MINUTES STEP 3: If your blood sugar is still low at the 15 minute recheck --> then, go back to STEP 1 and treat AGAIN with another 15 grams of carbohydrates.

## 2022-12-24 NOTE — Progress Notes (Signed)
Name: Morgan [name]  MRN/ DOB: [id number], 08-Jun-1950   Age/ Sex: 72 y.o., female    PCP: Madelin Headings, MD   Reason for Endocrinology Evaluation: Type 2 Diabetes Mellitus     Date of Initial Endocrinology Visit: 12/24/2022     PATIENT IDENTIFIER: [name] is a 72 y.o. female with a past medical history of DM, CAD, dyslipidemia. The patient presented for initial endocrinology clinic visit on 12/24/2022 for consultative assistance with her diabetes management.    HPI: [name] was    Diagnosed with DM 2004 Prior Medications tried/Intolerance: Byetta-hyperglycemia, Trulicity-hyperglycemia, Rybelsus-hyperglycemia, Victoza-hypoglycemia, Ozempic-hyperglycemia.  Started [facility] 10/2022 Currently checking blood sugars 1 x / day Hypoglycemia episodes : no               Hemoglobin A1c has ranged from 6.7% in 2022, peaking at 8.5% in 2024.  In terms of diet, the patient eats 3 meals a day, snacking is variable , avoids sugar-sweetened beverages   Denies nausea or vomiting  Denies constipation or diarrhea   Maternal grandfather with DM , brothers with DM   HOME DIABETES REGIMEN: Metformin 500 mg XR, 2 tabs BID Invokana 300 mg daily Mounjaro 5 mg weekly   Statin: Yes ACE-I/ARB: No   METER DOWNLOAD SUMMARY: Date range evaluated: 10/20-11/18/2024 Fingerstick Blood Glucose Tests = 31 Average Number Tests/Day = 1 Overall Mean FS Glucose = 212 Standard Deviation = 27  BG Ranges: Low = 178 High = 286   Hypoglycemic Events/30 Days: BG < 50 = 0 Episodes of symptomatic severe hypoglycemia = 0   DIABETIC COMPLICATIONS: Microvascular complications:  Denies: CKD, DR, neuropathy  Last eye exam: Completed 12/2021  Macrovascular complications:  CAD Denies:  PVD, CVA   PAST HISTORY: Past Medical History:  Past Medical History:  Diagnosis Date   Anemia    as a child    CAD, NATIVE VESSEL 05/13/2009   Cataracts, bilateral    MD just watching    Complication of anesthesia    DIABETES MELLITUS, TYPE II 12/09/2009   Gastric ulcer    Gastritis    GERD (gastroesophageal reflux disease)    Hx of abnormal cervical Pap smear    cryo  but normal since then    HYPERLIPIDEMIA-MIXED 04/14/2008   Intestinal metaplasia of gastric mucosa 2012   Myocardial infarction (HCC)    2003   Obesity    Occult blood in stools    Osteopenia    OVERWEIGHT/OBESITY 05/13/2009   Thyroid disease    Ulcer    Duodenal ulcer by x-ray 40 years ago   Varicose veins    under rx [place]son 2016   Past Surgical History:  Past Surgical History:  Procedure Laterality Date   BIOPSY  09/29/2018   Procedure: BIOPSY;  Surgeon: Lemar Lofty., MD;  Location: [facility] ENDOSCOPY;  Service: Gastroenterology;;   BIOPSY  01/14/2020   Procedure: BIOPSY;  Surgeon: Lemar Lofty., MD;  Location: [facility] ENDOSCOPY;  Service: Gastroenterology;;   CESAREAN SECTION     x2   CLOSED REDUCTION PROXIMAL TIBIOFIBULAR JOINT DISLOCATON  2004   left   COLONOSCOPY     COLONOSCOPY WITH PROPOFOL N/A 09/29/2018   Procedure: COLONOSCOPY WITH PROPOFOL;  Surgeon: Lemar Lofty., MD;  Location: [facility] ENDOSCOPY;  Service: Gastroenterology;  Laterality: N/A;   DILATION AND CURETTAGE OF UTERUS     ECTOPIC PREGNANCY SURGERY     ESOPHAGOGASTRODUODENOSCOPY N/A 02/12/2018   Procedure: ESOPHAGOGASTRODUODENOSCOPY (EGD);  Surgeon: Lemar Lofty., MD;  Location: [facility] ENDOSCOPY;  Service: Gastroenterology;  Laterality: N/A;   ESOPHAGOGASTRODUODENOSCOPY N/A 09/29/2018   Procedure: ESOPHAGOGASTRODUODENOSCOPY (EGD);  Surgeon: Lemar Lofty., MD;  Location: [facility] ENDOSCOPY;  Service: Gastroenterology;  Laterality: N/A;   ESOPHAGOGASTRODUODENOSCOPY (EGD) WITH PROPOFOL N/A 01/14/2020   Procedure: ESOPHAGOGASTRODUODENOSCOPY (EGD) WITH PROPOFOL;  Surgeon: Meridee Score Netty Starring., MD;  Location[facility] ENDOSCOPY;  Service: Gastroenterology;  Laterality: N/A;   EUS N/A 02/12/2018   Procedure: UPPER  ENDOSCOPIC ULTRASOUND (EUS) RADIAL;  Surgeon: Meridee Score Netty Starring., MD;  Location: [facility] ENDOSCOPY;  Service: Gastroenterology;  Laterality: N/A;   FIBULA FRACTURE SURGERY     GANGLION CYST EXCISION     POLYPECTOMY  02/12/2018   Procedure: POLYPECTOMY;  Surgeon: Mansouraty, Netty Starring., MD;  Location: [facility] ENDOSCOPY;  Service: Gastroenterology;;   POLYPECTOMY  01/14/2020   Procedure: POLYPECTOMY;  Surgeon: Lemar Lofty., MD;  Location: [facility] ENDOSCOPY;  Service: Gastroenterology;;   TONSILLECTOMY     TUBAL LIGATION     One Tube   UPPER GASTROINTESTINAL ENDOSCOPY     08/27/17    Social History:  reports that she quit smoking about 28 years ago. Her smoking use included cigarettes. She has never used smokeless tobacco. She reports that she does not drink alcohol and does not use drugs. Family History:  Family History  Problem Relation Age of Onset   Emphysema Mother        Smoker   Heart attack Mother        due to head injury   Alcohol abuse Mother    Heart disease Mother    Early death Mother    Varicose Veins Mother    AAA (abdominal aortic aneurysm) Mother    Lung cancer Father    Heart attack Father    Hypertension Father    Alcohol abuse Father    Heart disease Father        before age 55   Aneurysm Father    Hyperlipidemia Father    Alcohol abuse Brother    Diabetes Brother    Heart disease Brother    Heart attack Brother    Diabetes Brother    Aneurysm Paternal Grandmother    Diabetes Other        grandfather   Sudden Cardiac Death Brother    Colon cancer Neg Hx    Esophageal cancer Neg Hx    Stomach cancer Neg Hx    Inflammatory bowel disease Neg Hx    Liver disease Neg Hx    Pancreatic cancer Neg Hx    Rectal cancer Neg Hx      HOME MEDICATIONS: Allergies as of 12/24/2022       Reactions   Penicillins Rash   Did it involve swelling of the face/tongue/throat, SOB, or low BP? No Did it involve sudden or severe rash/hives, skin peeling, or any reaction  on the inside of your mouth or nose? No Did you need to seek medical attention at a hospital or doctor's office? No When did it last happen?      childhood allergy If all above answers are "NO", may proceed with cephalosporin use.        Medication List        Accurate as of December 24, 2022  7:45 AM. If you have any questions, ask your nurse or doctor.          STOP taking these medications    fluticasone 50 MCG/ACT nasal spray Commonly known as: FLONASE Stopped by: Scarlette Shorts  TAKE these medications    acetaminophen 500 MG tablet Commonly known as: TYLENOL Take 1,000 mg by mouth every 6 (six) hours as needed for moderate pain or headache.   aspirin 81 MG tablet Take 81 mg by mouth daily.   atorvastatin 80 MG tablet Commonly known as: LIPITOR TAKE 1 TABLET BY MOUTH ONCE DAILY AT 6PM   Cranberry Plus Vitamin C 4200-20-3 MG-MG-UNIT Caps Generic drug: Cranberry-Vitamin C-Vitamin E Take 2 capsules by mouth daily.   cyanocobalamin 1000 MCG tablet Commonly known as: VITAMIN B12 Take 2,000 mcg by mouth daily.   escitalopram 10 MG tablet Commonly known as: LEXAPRO Take 1 tablet (10 mg total) by mouth daily.   ezetimibe 10 MG tablet Commonly known as: ZETIA TAKE 1 TABLET BY MOUTH EVERY DAY   Fish Oil 1000 MG Caps Take 2,000 mg by mouth daily.   Invokana 300 MG Tabs tablet Generic drug: canagliflozin TAKE 1 TABLET BY MOUTH EVERY DAY BEFORE BREAKFAST   Iron 325 (65 Fe) MG Tabs Take 650 mg by mouth daily.   levothyroxine 50 MCG tablet Commonly known as: SYNTHROID Take 1 tablet (50 mcg total) by mouth daily.   LORazepam 0.5 MG tablet Commonly known as: ATIVAN Take 1-2 tabs    ( 0.5-1 mg) pre procedure   metFORMIN 500 MG 24 hr tablet Commonly known as: GLUCOPHAGE-XR TAKE 2 TABLETS BY MOUTH TWICE DAILY WITH MEALS   Mounjaro 5 MG/0.5ML Pen Generic drug: tirzepatide Inject 5 mg into the skin once a week. What changed: Another medication  with the same name was removed. Continue taking this medication, and follow the directions you see here. Changed by: Johnney Ou Malik Paar   MULTIVITAMIN PO Take 1 tablet by mouth daily.   omeprazole 40 MG capsule Commonly known as: PRILOSEC TAKE 1 CAPSULE BY MOUTH 2 TIMES DAILY   ONE TOUCH DELICA LANCING DEV Misc Use as directed to check blood sugars twice to four time per day. Dx E11.9   OneTouch Delica Plus Lancet33G Misc USE TO check blood sugar 2 TO 3 TIMES DAILY   OneTouch Ultra test strip Generic drug: glucose blood USE TO check blood sugar 2 TO 3 TIMES DAILY   Pfizer-BioNT COVID-19 Vac-TriS Susp injection Generic drug: COVID-19 mRNA Vac-TriS (Pfizer)   tretinoin 0.025 % cream Commonly known as: RETIN-A Apply topically.   Vitamin D3 125 MCG (5000 UT) Caps Take 5,000 Units by mouth daily.         ALLERGIES: Allergies  Allergen Reactions   Penicillins Rash    Did it involve swelling of the face/tongue/throat, SOB, or low BP? No Did it involve sudden or severe rash/hives, skin peeling, or any reaction on the inside of your mouth or nose? No Did you need to seek medical attention at a hospital or doctor's office? No When did it last happen?      childhood allergy If all above answers are "NO", may proceed with cephalosporin use.      REVIEW OF SYSTEMS: A comprehensive ROS was conducted with the patient and is negative except as per HPI    OBJECTIVE:   VITAL SIGNS: BP 124/70 (BP Location: Right Arm, Patient Position: Sitting, Cuff Size: Small)   Pulse 62   Ht 5\' 5"  (1.651 m)   Wt 160 lb (72.6 kg)   LMP  (LMP Unknown)   SpO2 99%   BMI 26.63 kg/m    PHYSICAL EXAM:  General: Pt appears well and is in NAD  Neck: General: Supple without adenopathy  or carotid bruits. Thyroid: Thyroid size normal.  No goiter or nodules appreciated.   Lungs: Clear with good BS bilat   Heart: RRR   Abdomen:  soft, nontender  Extremities:  Lower extremities - No pretibial  edema.   Neuro: MS is good with appropriate affect, pt is alert and Ox3    DM foot exam: 12/24/2022  The skin of the feet is intact without sores or ulcerations. The pedal pulses are 2+ on right and 2+ on left. The sensation is intact to a screening 5.07, 10 gram monofilament bilaterally    DATA REVIEWED:  Lab Results  Component Value Date   HGBA1C 8.5 (H) 10/17/2022   HGBA1C 8.3 (A) 02/20/2022   HGBA1C 8.4 (H) 08/14/2021    Latest Reference Range & Units 10/17/22 10:24  Sodium 135 - 145 mEq/L 133 (L)  Potassium 3.5 - 5.1 mEq/L 4.5  Chloride 96 - 112 mEq/L 96  CO2 19 - 32 mEq/L 29  Glucose 70 - 99 mg/dL 829 (H)  BUN 6 - 23 mg/dL 10  Creatinine 5.62 - 1.30 mg/dL 8.65  Calcium 8.4 - 78.4 mg/dL 69.6  Alkaline Phosphatase 39 - 117 U/L 64  Albumin 3.5 - 5.2 g/dL 4.4  AST 0 - 37 U/L 23  ALT 0 - 35 U/L 27  Total Protein 6.0 - 8.3 g/dL 6.8  Total Bilirubin 0.2 - 1.2 mg/dL 1.0  GFR >29.52 mL/min 87.86  Total CHOL/HDL Ratio  2  Cholesterol 0 - 200 mg/dL 841  HDL Cholesterol >32.44 mg/dL 01.02  LDL (calc) 0 - 99 mg/dL 58  MICROALB/CREAT RATIO 0.0 - 30.0 mg/g 3.3  NonHDL  74.83  Triglycerides 0.0 - 149.0 mg/dL 72.5  VLDL 0.0 - 36.6 mg/dL 44.0    Latest Reference Range & Units 10/17/22 10:24  Creatinine,U mg/dL 34.7  Microalb, Ur 0.0 - 1.9 mg/dL <4.2  MICROALB/CREAT RATIO 0.0 - 30.0 mg/g 3.3     ASSESSMENT / PLAN / RECOMMENDATIONS:   1) Type 2 Diabetes Mellitus, Poorly controlled, With macrovascular complications - Most recent A1c of 8.8 %. Goal A1c < 7.0 %.    Plan: GENERAL: I have discussed with the patient the pathophysiology of diabetes. We went over the natural progression of the disease. We stressed the importance of lifestyle changes. I explained the complications associated with diabetes including retinopathy, nephropathy, neuropathy as well as increased risk of cardiovascular disease. We went over the benefit seen with glycemic control.   I explained to the  patient that diabetic patients are at higher than normal risk for amputations.  The patient has been on Byetta, Trulicity, Victoza, with persistent hyperglycemia She was also on Ozempic 2 mg with an A1c of 8.3%, the patient has been noted worsening glycemic control, but I did explain to her this is due to transition from Ozempic to [place], I did also reassure the patient that she is on a small dose of Mounjaro and we have plenty of room to increase the dose and she will eventually see some benefit, we discussed the importance of gradually increasing Mounjaro dose to prevent side effects, in the meantime I have recommended that she takes sulfonylurea before the first meal of the day, caution against hypoglycemia Patient was given the option to contact us in 2 months if hypoglycemia persists, with no side effects to [facility], so I would increase the dose  MEDICATIONS: Start glimepiride 1 mg, 1 tablet before breakfast Continue metformin 500 mg XR, 2 tablets twice daily Continue Invokana 300 mg  daily Increase Mounjaro 7.5 mg weekly  EDUCATION / INSTRUCTIONS: BG monitoring instructions: Patient is instructed to check her blood sugars 1 times a day. Call [facility] if: BG persistently < 70  I reviewed the Rule of 15 for the treatment of hypoglycemia in detail with the patient. Literature supplied.   2) Diabetic complications:  Eye: Does not have known diabetic retinopathy.  Neuro/ Feet: Does not have known diabetic peripheral neuropathy. Renal: Patient does not have known baseline CKD. She is not on an ACEI/ARB at present.  3) Dyslipidemia:  -LDL and TG at goal - Patient on atorvastatin and Zetia  Follow-up in 3 months  Signed electronically by: Lyndle Herrlich, MD  [facility]  [facility] [address] [place] [place], [place] [address] Phone: [phone] FAX: [phone]   CC: Madelin Headings, MD [address]  [place] [address] Phone: [phone]  Fax: [phone]    Return to Endocrinology clinic as below: Future Appointments  Date Time Provider Department Center  02/15/2023  8:00 AM Lewayne Bunting, MD [facility] None  06/20/2023  8:30 AM Panosh, Neta Mends, MD [facility]  10/18/2023  8:15 AM [facility] WELLNESS VISIT [facility]

## 2022-12-25 ENCOUNTER — Other Ambulatory Visit (HOSPITAL_BASED_OUTPATIENT_CLINIC_OR_DEPARTMENT_OTHER): Payer: Self-pay

## 2022-12-25 ENCOUNTER — Other Ambulatory Visit: Payer: Self-pay

## 2022-12-25 MED ORDER — TIRZEPATIDE 7.5 MG/0.5ML ~~LOC~~ SOAJ: 7.5000 mg | SUBCUTANEOUS | 2 refills | Status: DC

## 2022-12-27 ENCOUNTER — Telehealth: Payer: Self-pay

## 2022-12-27 ENCOUNTER — Other Ambulatory Visit (HOSPITAL_BASED_OUTPATIENT_CLINIC_OR_DEPARTMENT_OTHER): Payer: Self-pay

## 2022-12-27 NOTE — Patient Outreach (Signed)
Attempted to contact patient regarding care gaps. Left voicemail for patient to return my call at [phone].  Nicholes Rough, CMA Care Guide [facility] [employer]

## 2023-02-11 NOTE — Progress Notes (Signed)
 HPI: FU CAD. Previous PCI of her RCA following myocardial infarction in 2003. Echocardiogram in 2004 showed normal LV function. Abd ultrasound 11/15 showed no aneurysm. Nuclear study 9/16 showed EF 57 and inability to evaluate inferior wall but otherwise normal perfusion. Carotid Dopplers October 2017 showed 1-39% stenosis. There is note of abnormal thyroid  tissue and dedicated thyroid  ultrasound suggested. Thyroid  ultrasound November 2017 showed borderline enlargement with moderately heterogeneous thyroid  with no discrete nodule. Since last seen, the patient denies any dyspnea on exertion, orthopnea, PND, pedal edema, palpitations, syncope or chest pain.   Current Outpatient Medications  Medication Sig Dispense Refill   acetaminophen (TYLENOL) 500 MG tablet Take 1,000 mg by mouth every 6 (six) hours as needed for moderate pain or headache.     aspirin 81 MG tablet Take 81 mg by mouth daily.       atorvastatin  (LIPITOR) 80 MG tablet TAKE 1 TABLET BY MOUTH ONCE DAILY AT 6PM 90 tablet 3   canagliflozin  (INVOKANA ) 300 MG TABS tablet Take 1 tablet (300 mg total) by mouth daily before breakfast. 90 tablet 2   Cholecalciferol (VITAMIN D3) 125 MCG (5000 UT) CAPS Take 5,000 Units by mouth daily.     Cranberry-Vitamin C-Vitamin E (CRANBERRY PLUS VITAMIN C) 4200-20-3 MG-MG-UNIT CAPS Take 2 capsules by mouth daily.     escitalopram  (LEXAPRO ) 10 MG tablet Take 1 tablet (10 mg total) by mouth daily. 30 tablet 1   ezetimibe  (ZETIA ) 10 MG tablet Take 1 tablet (10 mg total) by mouth daily. Please keep scheduled appointment for future refills. Thank you. 30 tablet 0   Ferrous Sulfate (IRON) 325 (65 Fe) MG TABS Take 650 mg by mouth daily.      glimepiride  (AMARYL ) 1 MG tablet Take 1 tablet (1 mg total) by mouth daily with breakfast. 90 tablet 2   Lancet Devices (ONE TOUCH DELICA LANCING DEV) MISC Use as directed to check blood sugars twice to four time per day. Dx E11.9 1 each 11   Lancets (ONETOUCH DELICA  PLUS LANCET33G) MISC USE TO check blood sugar 2 TO 3 TIMES DAILY 100 each 1   levothyroxine  (SYNTHROID ) 50 MCG tablet Take 1 tablet (50 mcg total) by mouth daily. 90 tablet 2   LORazepam  (ATIVAN ) 0.5 MG tablet Take 1-2 tabs    ( 0.5-1 mg) pre procedure 8 tablet 0   metFORMIN  (GLUCOPHAGE -XR) 500 MG 24 hr tablet Take 2 tablets (1,000 mg total) by mouth 2 (two) times daily with a meal. 360 tablet 2   Multiple Vitamins-Minerals (MULTIVITAMIN PO) Take 1 tablet by mouth daily.      Omega-3 Fatty Acids (FISH OIL) 1000 MG CAPS Take 2,000 mg by mouth daily.     omeprazole  (PRILOSEC) 40 MG capsule TAKE 1 CAPSULE BY MOUTH 2 TIMES DAILY 180 capsule 0   ONETOUCH ULTRA test strip USE TO check blood sugar 2 TO 3 TIMES DAILY 100 strip 1   PFIZER-BIONT COVID-19 VAC-TRIS SUSP injection      tirzepatide  (MOUNJARO ) 7.5 MG/0.5ML Pen Inject 7.5 mg into the skin once a week. 6 mL 2   tretinoin (RETIN-A) 0.025 % cream Apply topically.     vitamin B-12 (CYANOCOBALAMIN ) 1000 MCG tablet Take 2,000 mcg by mouth daily.     No current facility-administered medications for this visit.     Past Medical History:  Diagnosis Date   Anemia    as a child    CAD, NATIVE VESSEL 05/13/2009   Cataracts, bilateral  MD just watching   Complication of anesthesia    DIABETES MELLITUS, TYPE II 12/09/2009   Gastric ulcer    Gastritis    GERD (gastroesophageal reflux disease)    Hx of abnormal cervical Pap smear    cryo  but normal since then    HYPERLIPIDEMIA-MIXED 04/14/2008   Intestinal metaplasia of gastric mucosa 2012   Myocardial infarction [facility])    2003   Obesity    Occult blood in stools    Osteopenia    OVERWEIGHT/OBESITY 05/13/2009   Thyroid  disease    Ulcer    Duodenal ulcer by x-ray 40 years ago   Varicose veins    under rx [facility]son 2016    Past Surgical History:  Procedure Laterality Date   BIOPSY  09/29/2018   Procedure: BIOPSY;  Surgeon: Wilhelmenia Aloha Raddle., MD;  Location: [facility] ENDOSCOPY;  Service:  Gastroenterology;;   BIOPSY  01/14/2020   Procedure: BIOPSY;  Surgeon: Wilhelmenia Aloha Raddle., MD;  Location: [facility] ENDOSCOPY;  Service: Gastroenterology;;   CESAREAN SECTION     x2   CLOSED REDUCTION PROXIMAL TIBIOFIBULAR JOINT DISLOCATON  2004   left   COLONOSCOPY     COLONOSCOPY WITH PROPOFOL  N/A 09/29/2018   Procedure: COLONOSCOPY WITH PROPOFOL ;  Surgeon: Wilhelmenia Aloha Raddle., MD;  Location: [facility] ENDOSCOPY;  Service: Gastroenterology;  Laterality: N/A;   DILATION AND CURETTAGE OF UTERUS     ECTOPIC PREGNANCY SURGERY     ESOPHAGOGASTRODUODENOSCOPY N/A 02/12/2018   Procedure: ESOPHAGOGASTRODUODENOSCOPY (EGD);  Surgeon: Wilhelmenia Aloha Raddle., MD;  Location: [facility] ENDOSCOPY;  Service: Gastroenterology;  Laterality: N/A;   ESOPHAGOGASTRODUODENOSCOPY N/A 09/29/2018   Procedure: ESOPHAGOGASTRODUODENOSCOPY (EGD);  Surgeon: Wilhelmenia Aloha Raddle., MD;  Location: [facility] ENDOSCOPY;  Service: Gastroenterology;  Laterality: N/A;   ESOPHAGOGASTRODUODENOSCOPY (EGD) WITH PROPOFOL  N/A 01/14/2020   Procedure: ESOPHAGOGASTRODUODENOSCOPY (EGD) WITH PROPOFOL ;  Surgeon: Wilhelmenia Aloha Raddle., MD;  Location: [facility] ENDOSCOPY;  Service: Gastroenterology;  Laterality: N/A;   EUS N/A 02/12/2018   Procedure: UPPER ENDOSCOPIC ULTRASOUND (EUS) RADIAL;  Surgeon: Wilhelmenia Aloha Raddle., MD;  Location: [facility] ENDOSCOPY;  Service: Gastroenterology;  Laterality: N/A;   FIBULA FRACTURE SURGERY     GANGLION CYST EXCISION     POLYPECTOMY  02/12/2018   Procedure: POLYPECTOMY;  Surgeon: Wilhelmenia Aloha Raddle., MD;  Location: [facility] ENDOSCOPY;  Service: Gastroenterology;;   POLYPECTOMY  01/14/2020   Procedure: POLYPECTOMY;  Surgeon: Wilhelmenia Aloha Raddle., MD;  Location: [facility] ENDOSCOPY;  Service: Gastroenterology;;   TONSILLECTOMY     TUBAL LIGATION     One Tube   UPPER GASTROINTESTINAL ENDOSCOPY     08/27/17    Social History   Socioeconomic History   Marital status: Widowed    Spouse name: Not on file   Number of children: 2   Years of  education: phd    Highest education level: Not on file  Occupational History   Occupation: Mental Health Counselor    Comment: [employer]   Occupation: Glass Blower/designer: [employer]  Tobacco Use   Smoking status: Former    Current packs/day: 0.00    Types: Cigarettes    Quit date: 02/05/1994    Years since quitting: 29.0   Smokeless tobacco: Never  Vaping Use   Vaping status: Never Used  Substance and Sexual Activity   Alcohol use: No    Alcohol/week: 0.0 standard drinks of alcohol   Drug use: No   Sexual activity: Not on file  Other Topics Concern   Not on file  Social  History Narrative   HH of 2 currently    No tobacco    Separated     Recently widowed   5 14       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    No firearms    NO reg exercise    Pets- Poodles 2    Sleep 7 hours    Social Drivers of Corporate Investment Banker Strain: Low Risk  (10/12/2022)   Overall Financial Resource Strain (CARDIA)    Difficulty of Paying Living Expenses: Not hard at all  Food Insecurity: No Food Insecurity (10/12/2022)   Hunger Vital Sign    Worried About Running Out of Food in the Last Year: Never true    Ran Out of Food in the Last Year: Never true  Transportation Needs: No Transportation Needs (10/12/2022)   PRAPARE - Administrator, Civil Service (Medical): No    Lack of Transportation (Non-Medical): No  Physical Activity: Insufficiently Active (10/12/2022)   Exercise Vital Sign    Days of Exercise per Week: 1 day    Minutes of Exercise per Session: 30 min  Stress: No Stress Concern Present (10/12/2022)   [employer] of Occupational Health - Occupational Stress Questionnaire    Feeling of Stress : Not at all  Social Connections: Unknown (10/12/2022)   Social Connection and Isolation Panel [NHANES]    Frequency of Communication with Friends and Family: More  than three times a week    Frequency of Social Gatherings with Friends and Family: Twice a week    Attends Religious Services: Not on Insurance Claims Handler of Clubs or Organizations: Yes    Attends Banker Meetings: 1 to 4 times per year    Marital Status: Widowed  Intimate Partner Violence: Not At Risk (10/12/2022)   Humiliation, Afraid, Rape, and Kick questionnaire    Fear of Current or Ex-Partner: No    Emotionally Abused: No    Physically Abused: No    Sexually Abused: No    Family History  Problem Relation Age of Onset   Emphysema Mother        Smoker   Heart attack Mother        due to head injury   Alcohol abuse Mother    Heart disease Mother    Early death Mother    Varicose Veins Mother    AAA (abdominal aortic aneurysm) Mother    Lung cancer Father    Heart attack Father    Hypertension Father    Alcohol abuse Father    Heart disease Father        before age 2   Aneurysm Father    Hyperlipidemia Father    Alcohol abuse Brother    Diabetes Brother    Heart disease Brother    Heart attack Brother    Diabetes Brother    Aneurysm Paternal Grandmother    Diabetes Other        grandfather   Sudden Cardiac Death Brother    Colon cancer Neg Hx    Esophageal cancer Neg Hx    Stomach cancer Neg Hx    Inflammatory bowel disease Neg Hx    Liver disease Neg Hx    Pancreatic cancer Neg Hx    Rectal cancer Neg Hx     ROS: no fevers or chills,  productive cough, hemoptysis, dysphasia, odynophagia, melena, hematochezia, dysuria, hematuria, rash, seizure activity, orthopnea, PND, pedal edema, claudication. Remaining systems are negative.  Physical Exam: Well-developed well-nourished in no acute distress.  Skin is warm and dry.  HEENT is normal.  Neck is supple.  Chest is clear to auscultation with normal expansion.  Cardiovascular exam is regular rate and rhythm.  Abdominal exam nontender or distended. No masses palpated. Extremities show no  edema. neuro grossly intact  EKG Interpretation Date/Time:  Friday February 15 2023 08:13:40 EST Ventricular Rate:  55 PR Interval:  234 QRS Duration:  96 QT Interval:  432 QTC Calculation: 413 R Axis:   89  Text Interpretation: Sinus bradycardia with 1st degree A-V block When compared with ECG of 11-Mar-2002 14:21, PR interval has increased Vent. rate has decreased BY  30 BPM Confirmed by Pietro Rogue ([id number]) on 02/15/2023 8:15:38 AM    A/P  1 coronary artery disease-patient denies chest pain.  Continue aspirin and statin.  2 hyperlipidemia-continue present medications (Zetia  and Lipitor).  3 carotid artery disease-mild on most recent Dopplers.  Continue statin.  Rogue Pietro, MD

## 2023-02-12 ENCOUNTER — Other Ambulatory Visit: Payer: Self-pay | Admitting: Family

## 2023-02-12 ENCOUNTER — Other Ambulatory Visit: Payer: Self-pay | Admitting: Cardiology

## 2023-02-15 ENCOUNTER — Other Ambulatory Visit: Payer: Self-pay

## 2023-02-15 ENCOUNTER — Encounter: Payer: Self-pay | Admitting: Cardiology

## 2023-02-15 ENCOUNTER — Ambulatory Visit: Payer: PPO | Attending: Cardiology | Admitting: Cardiology

## 2023-02-15 VITALS — BP 110/72 | HR 55 | Ht 65.0 in | Wt 158.8 lb

## 2023-02-15 DIAGNOSIS — E7800 Pure hypercholesterolemia, unspecified: Secondary | ICD-10-CM | POA: Diagnosis not present

## 2023-02-15 DIAGNOSIS — I2510 Atherosclerotic heart disease of native coronary artery without angina pectoris: Secondary | ICD-10-CM | POA: Diagnosis not present

## 2023-02-15 MED ORDER — ONETOUCH DELICA PLUS LANCET33G MISC: 1 refills | Status: DC

## 2023-02-15 MED ORDER — ONETOUCH ULTRA VI STRP: ORAL_STRIP | 1 refills | Status: DC

## 2023-02-15 NOTE — Patient Instructions (Signed)
 Medication Instructions:  Your physician recommends that you continue on your current medications as directed. Please refer to the Current Medication list given to you today.  *If you need a refill on your cardiac medications before your next appointment, please call your pharmacy*   Follow-Up: At [facility], you and your health needs are our priority.  As part of our continuing mission to provide you with exceptional heart care, we have created designated Provider Care Teams.  These Care Teams include your primary Cardiologist (physician) and Advanced Practice Providers (APPs -  Physician Assistants and Nurse Practitioners) who all work together to provide you with the care you need, when you need it.  We recommend signing up for the patient portal called MyChart.  Sign up information is provided on this After Visit Summary.  MyChart is used to connect with patients for Virtual Visits (Telemedicine).  Patients are able to view lab/test results, encounter notes, upcoming appointments, etc.  Non-urgent messages can be sent to your provider as well.   To learn more about what you can do with MyChart, go to [email or web address].    Your next appointment:   12 month(s)  Provider:   Redell Shallow, MD     Other Instructions

## 2023-02-26 DIAGNOSIS — H524 Presbyopia: Secondary | ICD-10-CM | POA: Diagnosis not present

## 2023-02-26 DIAGNOSIS — E119 Type 2 diabetes mellitus without complications: Secondary | ICD-10-CM | POA: Diagnosis not present

## 2023-02-26 DIAGNOSIS — Z961 Presence of intraocular lens: Secondary | ICD-10-CM | POA: Diagnosis not present

## 2023-02-26 LAB — HM DIABETES EYE EXAM

## 2023-03-25 ENCOUNTER — Encounter: Payer: Self-pay | Admitting: Internal Medicine

## 2023-03-25 ENCOUNTER — Ambulatory Visit: Payer: PPO | Admitting: Internal Medicine

## 2023-03-25 VITALS — BP 120/70 | HR 68 | Ht 65.0 in | Wt 158.4 lb

## 2023-03-25 DIAGNOSIS — E1159 Type 2 diabetes mellitus with other circulatory complications: Secondary | ICD-10-CM

## 2023-03-25 DIAGNOSIS — Z7985 Long-term (current) use of injectable non-insulin antidiabetic drugs: Secondary | ICD-10-CM | POA: Diagnosis not present

## 2023-03-25 DIAGNOSIS — Z7984 Long term (current) use of oral hypoglycemic drugs: Secondary | ICD-10-CM | POA: Diagnosis not present

## 2023-03-25 LAB — POCT GLYCOSYLATED HEMOGLOBIN (HGB A1C): Hemoglobin A1C: 7 % — AB (ref 4.0–5.6)

## 2023-03-25 NOTE — Progress Notes (Signed)
Name: Morgan [name]  MRN/ DOB: [id number], [birth year]   Age/ Sex: 73 y.o., female    PCP: Madelin Headings, MD   Reason for Endocrinology Evaluation: Type 2 Diabetes Mellitus     Date of Initial Endocrinology Visit: 12/24/2022    PATIENT IDENTIFIER: [name] is a 73 y.o. female with a past medical history of DM, CAD, dyslipidemia. The patient presented for initial endocrinology clinic visit on 12/24/2022 for consultative assistance with her diabetes management.    HPI: Morgan [name] was    Diagnosed with DM 2004 Prior Medications tried/Intolerance: Byetta-hyperglycemia, Trulicity-hyperglycemia, Rybelsus-hyperglycemia, Victoza-hypoglycemia, Ozempic-hyperglycemia.  Started [facility] 10/2022              Hemoglobin A1c has ranged from 6.7% in 2022, peaking at 8.5% in 2024.   Maternal grandfather with DM , brothers with DM   On her initial visit to our clinic she had an A1c of 8.8%, she was on metformin, Invokana, and Mounjaro.  I increase Mounjaro and started her on glimepiride  SUBJECTIVE:   During the last visit (12/24/2022): A1c 8.8%  Today (03/25/23): Morgan [name] is here for follow-up on diabetes management.  She checks her blood sugars 2 times daily. The patient has not had hypoglycemic episodes since the last clinic visit.  Rare changes in bowel movements  Denies nausea or vomiting   HOME DIABETES REGIMEN: Metformin 500 mg XR, 2 tabs BID Glimepiride 1 mg daily Invokana 300 mg daily Mounjaro 7.5 mg weekly   Statin: Yes ACE-I/ARB: No   METER DOWNLOAD SUMMARY: Date range evaluated: 2/4-2/17/2025 Fingerstick Blood Glucose Tests = 24 Average Number Tests/Day = 2 Overall Mean FS Glucose = 151 Standard Deviation = 16  BG Ranges: Low = 116 High = 174   Hypoglycemic Events/30 Days: BG < 50 = 0 Episodes of symptomatic severe hypoglycemia = 0   DIABETIC COMPLICATIONS: Microvascular complications:  Denies: CKD, DR, neuropathy  Last eye exam:  Completed 12/2021  Macrovascular complications:  CAD Denies:  PVD, CVA   PAST HISTORY: Past Medical History:  Past Medical History:  Diagnosis Date   Anemia    as a child    CAD, NATIVE VESSEL 05/13/2009   Cataracts, bilateral    MD just watching   Complication of anesthesia    DIABETES MELLITUS, TYPE II 12/09/2009   Gastric ulcer    Gastritis    GERD (gastroesophageal reflux disease)    Hx of abnormal cervical Pap smear    cryo  but normal since then    HYPERLIPIDEMIA-MIXED 04/14/2008   Intestinal metaplasia of gastric mucosa 2012   Myocardial infarction (HCC)    2003   Obesity    Occult blood in stools    Osteopenia    OVERWEIGHT/OBESITY 05/13/2009   Thyroid disease    Ulcer    Duodenal ulcer by x-ray 40 years ago   Varicose veins    under rx lawson 2016   Past Surgical History:  Past Surgical History:  Procedure Laterality Date   BIOPSY  09/29/2018   Procedure: BIOPSY;  Surgeon: Lemar Lofty., MD;  Location: [facility] ENDOSCOPY;  Service: Gastroenterology;;   BIOPSY  01/14/2020   Procedure: BIOPSY;  Surgeon: Lemar Lofty., MD;  Location: [facility] ENDOSCOPY;  Service: Gastroenterology;;   CESAREAN SECTION     x2   CLOSED REDUCTION PROXIMAL TIBIOFIBULAR JOINT DISLOCATON  2004   left   COLONOSCOPY     COLONOSCOPY WITH PROPOFOL N/A 09/29/2018   Procedure: COLONOSCOPY WITH PROPOFOL;  Surgeon: Lemar Lofty., MD;  Location: [facility] ENDOSCOPY;  Service: Gastroenterology;  Laterality: N/A;   DILATION AND CURETTAGE OF UTERUS     ECTOPIC PREGNANCY SURGERY     ESOPHAGOGASTRODUODENOSCOPY N/A 02/12/2018   Procedure: ESOPHAGOGASTRODUODENOSCOPY (EGD);  Surgeon: Lemar Lofty., MD;  Location: [facility] ENDOSCOPY;  Service: Gastroenterology;  Laterality: N/A;   ESOPHAGOGASTRODUODENOSCOPY N/A 09/29/2018   Procedure: ESOPHAGOGASTRODUODENOSCOPY (EGD);  Surgeon: Lemar Lofty., MD;  Location: [facility] ENDOSCOPY;  Service: Gastroenterology;  Laterality: N/A;    ESOPHAGOGASTRODUODENOSCOPY (EGD) WITH PROPOFOL N/A 01/14/2020   Procedure: ESOPHAGOGASTRODUODENOSCOPY (EGD) WITH PROPOFOL;  Surgeon: Meridee Score Netty Starring., MD;  Location: [facility] ENDOSCOPY;  Service: Gastroenterology;  Laterality: N/A;   EUS N/A 02/12/2018   Procedure: UPPER ENDOSCOPIC ULTRASOUND (EUS) RADIAL;  Surgeon: Meridee Score Netty Starring., MD;  Location: [facility] ENDOSCOPY;  Service: Gastroenterology;  Laterality: N/A;   FIBULA FRACTURE SURGERY     GANGLION CYST EXCISION     POLYPECTOMY  02/12/2018   Procedure: POLYPECTOMY;  Surgeon: Mansouraty, Netty Starring., MD;  Location: [facility] ENDOSCOPY;  Service: Gastroenterology;;   POLYPECTOMY  01/14/2020   Procedure: POLYPECTOMY;  Surgeon: Lemar Lofty., MD;  Location: [facility] ENDOSCOPY;  Service: Gastroenterology;;   TONSILLECTOMY     TUBAL LIGATION     One Tube   UPPER GASTROINTESTINAL ENDOSCOPY     08/27/17    Social History:  reports that she quit smoking about 29 years ago. Her smoking use included cigarettes. She has never used smokeless tobacco. She reports that she does not drink alcohol and does not use drugs. Family History:  Family History  Problem Relation Age of Onset   Emphysema Mother        Smoker   Heart attack Mother        due to head injury   Alcohol abuse Mother    Heart disease Mother    Early death Mother    Varicose Veins Mother    AAA (abdominal aortic aneurysm) Mother    Lung cancer Father    Heart attack Father    Hypertension Father    Alcohol abuse Father    Heart disease Father        before age 81   Aneurysm Father    Hyperlipidemia Father    Alcohol abuse Brother    Diabetes Brother    Heart disease Brother    Heart attack Brother    Diabetes Brother    Aneurysm Paternal Grandmother    Diabetes Other        grandfather   Sudden Cardiac Death Brother    Colon cancer Neg Hx    Esophageal cancer Neg Hx    Stomach cancer Neg Hx    Inflammatory bowel disease Neg Hx    Liver disease Neg Hx    Pancreatic cancer  Neg Hx    Rectal cancer Neg Hx      HOME MEDICATIONS: Allergies as of 03/25/2023       Reactions   Penicillins Rash   Did it involve swelling of the face/tongue/throat, SOB, or low BP? No Did it involve sudden or severe rash/hives, skin peeling, or any reaction on the inside of your mouth or nose? No Did you need to seek medical attention at a hospital or doctor's office? No When did it last happen?      childhood allergy If all above answers are "NO", may proceed with cephalosporin use.        Medication List        Accurate as  of March 25, 2023  9:23 AM. If you have any questions, ask your nurse or doctor.          acetaminophen 500 MG tablet Commonly known as: TYLENOL Take 1,000 mg by mouth every 6 (six) hours as needed for moderate pain or headache.   aspirin 81 MG tablet Take 81 mg by mouth daily.   atorvastatin 80 MG tablet Commonly known as: LIPITOR TAKE 1 TABLET BY MOUTH ONCE DAILY AT 6PM   canagliflozin 300 MG Tabs tablet Commonly known as: Invokana Take 1 tablet (300 mg total) by mouth daily before breakfast.   Cranberry Plus Vitamin C 4200-20-3 MG-MG-UNIT Caps Generic drug: Cranberry-Vitamin C-Vitamin E Take 2 capsules by mouth daily.   cyanocobalamin 1000 MCG tablet Commonly known as: VITAMIN B12 Take 2,000 mcg by mouth daily.   escitalopram 10 MG tablet Commonly known as: LEXAPRO Take 1 tablet (10 mg total) by mouth daily.   ezetimibe 10 MG tablet Commonly known as: ZETIA Take 1 tablet (10 mg total) by mouth daily. Please keep scheduled appointment for future refills. Thank you.   Fish Oil 1000 MG Caps Take 2,000 mg by mouth daily.   glimepiride 1 MG tablet Commonly known as: AMARYL Take 1 tablet (1 mg total) by mouth daily with breakfast.   Iron 325 (65 Fe) MG Tabs Take 650 mg by mouth daily.   levothyroxine 50 MCG tablet Commonly known as: SYNTHROID Take 1 tablet (50 mcg total) by mouth daily.   LORazepam 0.5 MG  tablet Commonly known as: ATIVAN Take 1-2 tabs    ( 0.5-1 mg) pre procedure   metFORMIN 500 MG 24 hr tablet Commonly known as: GLUCOPHAGE-XR Take 2 tablets (1,000 mg total) by mouth 2 (two) times daily with a meal.   MULTIVITAMIN PO Take 1 tablet by mouth daily.   omeprazole 40 MG capsule Commonly known as: PRILOSEC TAKE 1 CAPSULE BY MOUTH 2 TIMES DAILY   ONE TOUCH DELICA LANCING DEV Misc Use as directed to check blood sugars twice to four time per day. Dx E11.9   OneTouch Delica Plus Lancet33G Misc USE TO check blood sugar 2 TO 3 TIMES DAILY   OneTouch Ultra test strip Generic drug: glucose blood Use as instructed   Pfizer-BioNT COVID-19 Vac-TriS Susp injection Generic drug: COVID-19 mRNA Vac-TriS (Pfizer)   tirzepatide 7.5 MG/0.5ML Pen Commonly known as: MOUNJARO Inject 7.5 mg into the skin once a week.   tretinoin 0.025 % cream Commonly known as: RETIN-A Apply topically.   Vitamin D3 125 MCG (5000 UT) Caps Take 5,000 Units by mouth daily.         ALLERGIES: Allergies  Allergen Reactions   Penicillins Rash    Did it involve swelling of the face/tongue/throat, SOB, or low BP? No Did it involve sudden or severe rash/hives, skin peeling, or any reaction on the inside of your mouth or nose? No Did you need to seek medical attention at a hospital or doctor's office? No When did it last happen?      childhood allergy If all above answers are "NO", may proceed with cephalosporin use.      REVIEW OF SYSTEMS: A comprehensive ROS was conducted with the patient and is negative except as per HPI    OBJECTIVE:   VITAL SIGNS: BP 120/70 (BP Location: Left Arm, Patient Position: Sitting, Cuff Size: Small)   Pulse 68   Ht 5\' 5"  (1.651 m)   Wt 158 lb 6.4 oz (71.8 kg)   LMP  (  LMP Unknown)   SpO2 98%   BMI 26.36 kg/m    PHYSICAL EXAM:  General: Pt appears well and is in NAD  Neck: General: Supple without adenopathy or carotid bruits. Thyroid: Thyroid size  normal.  No goiter or nodules appreciated.   Lungs: Clear with good BS bilat   Heart: RRR   Abdomen:  soft, nontender  Extremities:  Lower extremities - No pretibial edema.   Neuro: MS is good with appropriate affect, pt is alert and Ox3    DM foot exam: 12/24/2022  The skin of the feet is intact without sores or ulcerations. The pedal pulses are 2+ on right and 2+ on left. The sensation is intact to a screening 5.07, 10 gram monofilament bilaterally    DATA REVIEWED:  Lab Results  Component Value Date   HGBA1C 8.8 (A) 12/24/2022   HGBA1C 8.5 (H) 10/17/2022   HGBA1C 8.3 (A) 02/20/2022    Latest Reference Range & Units 10/17/22 10:24  Sodium 135 - 145 mEq/L 133 (L)  Potassium 3.5 - 5.1 mEq/L 4.5  Chloride 96 - 112 mEq/L 96  CO2 19 - 32 mEq/L 29  Glucose 70 - 99 mg/dL 960 (H)  BUN 6 - 23 mg/dL 10  Creatinine 4.54 - 0.98 mg/dL 1.19  Calcium 8.4 - 14.7 mg/dL 82.9  Alkaline Phosphatase 39 - 117 U/L 64  Albumin 3.5 - 5.2 g/dL 4.4  AST 0 - 37 U/L 23  ALT 0 - 35 U/L 27  Total Protein 6.0 - 8.3 g/dL 6.8  Total Bilirubin 0.2 - 1.2 mg/dL 1.0  GFR >56.21 mL/min 87.86  Total CHOL/HDL Ratio  2  Cholesterol 0 - 200 mg/dL 308  HDL Cholesterol >65.78 mg/dL 46.96  LDL (calc) 0 - 99 mg/dL 58  MICROALB/CREAT RATIO 0.0 - 30.0 mg/g 3.3  NonHDL  74.83  Triglycerides 0.0 - 149.0 mg/dL 29.5  VLDL 0.0 - 28.4 mg/dL 13.2    Latest Reference Range & Units 10/17/22 10:24  Creatinine,U mg/dL 44.0  Microalb, Ur 0.0 - 1.9 mg/dL <1.0  MICROALB/CREAT RATIO 0.0 - 30.0 mg/g 3.3     ASSESSMENT / PLAN / RECOMMENDATIONS:   1) Type 2 Diabetes Mellitus, Optimally  controlled, With macrovascular complications - Most recent A1c of 7.0 %. Goal A1c < 7.0 %.    -A1c has trended down from 8.8% to 7.0% -The patient has been on Byetta, Trulicity, Victoza, with persistent hyperglycemia - She was also on Ozempic 2 mg with an A1c of 8.3% -She is currently on Mounjaro 7.5 mg dose, she would like to remain  on the current dose at this time -No other changes  MEDICATIONS: Continue glimepiride 1 mg, 1 tablet before breakfast Continue metformin 500 mg XR, 2 tablets twice daily Continue Invokana 300 mg daily Continue Mounjaro 7.5 mg weekly  EDUCATION / INSTRUCTIONS: BG monitoring instructions: Patient is instructed to check her blood sugars 1 times a day. Call [facility] if: BG persistently < 70  I reviewed the Rule of 15 for the treatment of hypoglycemia in detail with the patient. Literature supplied.   2) Diabetic complications:  Eye: Does not have known diabetic retinopathy.  Neuro/ Feet: Does not have known diabetic peripheral neuropathy. Renal: Patient does not have known baseline CKD. She is not on an ACEI/ARB at present.  3) Dyslipidemia:  -LDL and TG at goal - Patient on atorvastatin and Zetia  Follow-up in 4 months  Signed electronically by: Lyndle Herrlich, MD  [facility]  [facility] [address] [place], [place] [address] Phone: [phone] FAX: [phone]   CC: Madelin Headings, MD [address] [place] [address] Phone: [phone]  Fax: [phone]    Return to Endocrinology clinic as below: Future Appointments  Date Time Provider Department Center  04/16/2023  8:30 AM Napoleon Form, MD [facility]  06/20/2023  8:30 AM Panosh, Neta Mends, MD [facility]  10/18/2023  8:00 AM [facility]NUAL WELLNESS VISIT [facility]

## 2023-03-25 NOTE — Patient Instructions (Addendum)
Continue Glimepiride 1 mg , 1 tablet before Breakfast  Continue Metformin 500 mg XR, 2 tablets twice a day  Invokana 300 mg, 1 tablet every morning  Continue Mounjaro 7.5 mg weekly     HOW TO TREAT LOW BLOOD SUGARS (Blood sugar LESS THAN 70 MG/DL) Please follow the RULE OF 15 for the treatment of hypoglycemia treatment (when your (blood sugars are less than 70 mg/dL)   STEP 1: Take 15 grams of carbohydrates when your blood sugar is low, which includes:  3-4 GLUCOSE TABS  OR 3-4 OZ OF JUICE OR REGULAR SODA OR ONE TUBE OF GLUCOSE GEL    STEP 2: RECHECK blood sugar in 15 MINUTES STEP 3: If your blood sugar is still low at the 15 minute recheck --> then, go back to STEP 1 and treat AGAIN with another 15 grams of carbohydrates.

## 2023-03-28 ENCOUNTER — Other Ambulatory Visit: Payer: Self-pay | Admitting: Internal Medicine

## 2023-04-16 ENCOUNTER — Ambulatory Visit: Payer: PPO | Admitting: Gastroenterology

## 2023-05-07 ENCOUNTER — Other Ambulatory Visit: Payer: Self-pay | Admitting: Internal Medicine

## 2023-05-08 NOTE — Telephone Encounter (Signed)
 Ok to change  in short run  because of availability

## 2023-05-13 ENCOUNTER — Other Ambulatory Visit: Payer: Self-pay | Admitting: Cardiology

## 2023-05-13 ENCOUNTER — Other Ambulatory Visit: Payer: Self-pay | Admitting: Internal Medicine

## 2023-05-13 NOTE — Telephone Encounter (Signed)
 Copied from CRM [id number]. Topic: Clinical - Medication Refill >> May 13, 2023 10:55 AM Mickle Plumb wrote: Most Recent Primary Care Visit:  Provider: Berniece Andreas K  Department: [facility]  Visit Type: PHYSICAL  Date: 10/17/2022  Medication: escitalopram (LEXAPRO) 10 MG tablet  Has the patient contacted their pharmacy? Yes (Agent: If no, request that the patient contact the pharmacy for the refill. If patient does not wish to contact the pharmacy document the reason why and proceed with request.) (Agent: If yes, when and what did the pharmacy advise?)  Is this the correct pharmacy for this prescription? Yes If no, delete pharmacy and type the correct one.  This is the patient's preferred pharmacy:  [facility] [place], [place] - [address] [place] [address] Phone: [phone] Fax: [phone]  [id number] [place],  - [address] AT [facility] [address] [facility] [address] [place] [address] Phone: [phone] Fax: [phone]  [facility] [address] [place] [address] Phone: [phone] Fax: [phone]   Has the prescription been filled recently? Yes  Is the patient out of the medication? Yes  Has the patient been seen for an appointment in the last year OR does the patient have an upcoming appointment? Yes  Can we respond through MyChart? Yes  Agent: Please be advised that Rx refills may take up to 3 business days. We ask that you follow-up with your pharmacy.

## 2023-05-14 ENCOUNTER — Other Ambulatory Visit: Payer: Self-pay | Admitting: Internal Medicine

## 2023-05-21 ENCOUNTER — Telehealth: Payer: Self-pay | Admitting: *Deleted

## 2023-05-21 ENCOUNTER — Other Ambulatory Visit: Payer: Self-pay | Admitting: Internal Medicine

## 2023-05-21 MED ORDER — OMEPRAZOLE 40 MG PO CPDR: 40.0000 mg | DELAYED_RELEASE_CAPSULE | Freq: Two times a day (BID) | ORAL | 0 refills | Status: DC

## 2023-05-21 NOTE — Telephone Encounter (Signed)
 Rx sent.   Spoke to Morgan [name]. She reports they received the Rx and they have filled it. No further action is needed.

## 2023-05-21 NOTE — Telephone Encounter (Signed)
 Copied from CRM [id number]. Topic: Clinical - Medication Refill >> May 21, 2023 11:28 AM Deaijah H wrote: Most Recent Primary Care Visit:  Provider: Daphine Eagle K  Department: [facility]  Visit Type: PHYSICAL  Date: 10/17/2022  Medication: omeprazole (PRILOSEC) 40 MG capsule  Has the patient contacted their pharmacy? Yes (Agent: If no, request that the patient contact the pharmacy for the refill. If patient does not wish to contact the pharmacy document the reason why and proceed with request.) (Agent: If yes, when and what did the pharmacy advise?)  Is this the correct pharmacy for this prescription? Yes If no, delete pharmacy and type the correct one.  This is the patient's preferred pharmacy:  [facility] [place], [place] - [address] [place] [address] Phone: [phone] Fax: [phone]    Has the prescription been filled recently? No  Is the patient out of the medication? Yes  Has the patient been seen for an appointment in the last year OR does the patient have an upcoming appointment? Yes  Can we respond through MyChart? Yes  Agent: Please be advised that Rx refills may take up to 3 business days. We ask that you follow-up with your pharmacy.

## 2023-05-21 NOTE — Telephone Encounter (Signed)
 Copied from CRM [id number]. Topic: Clinical - Medication Question >> May 21, 2023 10:32 AM Luane Rumps D wrote: Reason for CRM: Latoya from [facility] calling to check on the status of a medication refill request that they had sent a week ago for  Morgan [name] for omeprazole (PRILOSEC) 40 MG capsule, she can be reached at [phone].

## 2023-05-23 DIAGNOSIS — M25572 Pain in left ankle and joints of left foot: Secondary | ICD-10-CM | POA: Diagnosis not present

## 2023-05-23 DIAGNOSIS — M25562 Pain in left knee: Secondary | ICD-10-CM | POA: Diagnosis not present

## 2023-05-23 DIAGNOSIS — M25472 Effusion, left ankle: Secondary | ICD-10-CM | POA: Diagnosis not present

## 2023-05-28 ENCOUNTER — Encounter: Payer: Self-pay | Admitting: Gastroenterology

## 2023-05-28 ENCOUNTER — Ambulatory Visit: Admitting: Gastroenterology

## 2023-05-28 VITALS — BP 110/60 | HR 60 | Ht 65.0 in | Wt 159.1 lb

## 2023-05-28 DIAGNOSIS — Z8719 Personal history of other diseases of the digestive system: Secondary | ICD-10-CM

## 2023-05-28 DIAGNOSIS — Z860100 Personal history of colon polyps, unspecified: Secondary | ICD-10-CM | POA: Diagnosis not present

## 2023-05-28 DIAGNOSIS — K219 Gastro-esophageal reflux disease without esophagitis: Secondary | ICD-10-CM

## 2023-05-28 DIAGNOSIS — R194 Change in bowel habit: Secondary | ICD-10-CM

## 2023-05-28 DIAGNOSIS — K31A0 Gastric intestinal metaplasia, unspecified: Secondary | ICD-10-CM | POA: Diagnosis not present

## 2023-05-28 NOTE — Progress Notes (Signed)
 Chief Complaint:follow-up irregular bowel movements Primary GI Doctor:Dr. 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   Subsequent surveillance EGDs negative for recurrence. She had large adenomatous polyps removed from colon in 2019, surveillance colonoscopy in 2020 was negative.  She was recommended 3-year recall.   I last saw her on 05/31/2020. At that time she was having irregular bowel habits with constipation and diarrhea. She changed her diet, avoided caffeinated drinks with improvement of symptoms. 04/16/22 today, she reports feeling fine. She reports her bowl movements are normal and that she no longer experiences diarrhea. Patient reports being on Ozempic  for her diabetes. She reports losing minimal weight on Ozempic  as she has been on diabetes medications for a while and therefore it doesn't impact her weight. She reports taking Omeprazole  40 mg 2x a day for her reflux symptoms with adequate control, no breakthrough heartburn or GERD.    She denies any black stool, diarrhea, constipation, nausea, vomiting, bloating, reflux, melena or bright red blood per rectum   She reports having some occasional rib pain on the left side below the bra wire that started 2 weeks ago. She states that her pain resulted from leaning over a metal clipboard. She described her pain as sensitivity.   She reports having had surgery on her left foot.   GI HX 05/21/22 patient in [facility] for EGD and colonoscopy for gastric nodule with intestinal metaplasia and history of adenomatous colon polyps EGD Impression: - Esophageal mucosal changes suspicious for short- segment Barrett' s esophagus. Biopsied. - 3 cm hiatal hernia. - Gastritis. Biopsied. - Normal examined duodenum. Path: Diagnosis 1. Surgical [P],  gastric antrum REACTIVE GASTROPATHY WITH CHRONIC GASTRITIS HELICOBACTER STAIN NEGATIVE ([facility], ADEQUATE CONTROL) NEGATIVE FOR INTESTINAL METAPLASIA, DYSPLASIA AND CARCINOMA 2. Surgical [P], gastric body HYPERPLASTIC POLYP CHRONIC GASTRITIS HELICOBACTER STAIN NEGATIVE ([facility], ADEQUATE CONTROL) NEGATIVE FOR INTESTINAL METAPLASIA, DYSPLASIA CARCINOMA 3. Surgical [P], esophagus bxs REACTIVE SQUAMOUS MUCOSA NEGATIVE FOR GLANDULAR EPITHELIUM, EOSINOPHILS, DYSPLASIA AND CARCINOMA Colonoscopy Impression: - Diverticulosis in the sigmoid colon. - Anal papilla( e) were hypertrophied. - Non- bleeding external and internal hemorrhoids. - The examination was otherwise normal. - No specimens collected. - The GI Genius ( intelligent endoscopy module) , computer- aided polyp detection system powered by AI was utilized to detect colorectal polyps through enhanced visualization during colonoscopy.  EGD 01/14/20 - No gross lesions in esophagus proximally. Salmon-colored mucosa tongue noted -biopsied to rule out Barrett's. - Z-line irregular, 37 cm from the incisors. - 3 cm hiatal hernia. - One gastric polyp - likely hyperplastic or fundic glan. Resected and retrieved. - Scar in the prepyloric to pylorus region. Some protuberance/nodularity within the pyloric channel noted. Region biopsied via cold snare to ensure no evidence of recurrent adenoma. - No other gross lesions in the stomach. - No gross lesions in the duodenal bulb, in the first portion of the duodenum and in the second portion of the duodenum.   A. PRE-PYLORIC EMR SCAR, BIOPSY:  - Gastric antral type mucosa with mild chronic gastritis, reactive  gastropathy and focal intestinal metaplasia.  - Negative for dysplasia.   B. ESOPHAGUS, DISTAL, BIOPSY:  - Squamocolumnar esophageal mucosa with reactive/regenerative changes.  - Negative for intestinal metaplasia (goblet cell metaplasia).   C. STOMACH, POLYPECTOMY:  - Fundic gland polyp.  -  Negative for dysplasia.    EGD 09/29/18 - No  gross lesions in proximal/middle esophagus. Salmon-colored mucosa suggestive of short-segment Barrett's esophagus. Biopsied. - Small hiatal hernia. - No gross lesions in the stomach. Gastric mapping biopsies performed. - Scar in the prepyloric region of the stomach from previous mucosectomy without gross evidence of residual or recurrent polyp. Biopsied. - No gross lesions in the duodenal bulb, in the first portion of the duodenum and in the second portion of the duodenum.   Colonoscopy 09/29/18 - The examined portion of the ileum was normal. - Normal mucosa in the entire examined colon. - No evidence of recurrent polypoid tissue present on today's examination. - Non-bleeding non-thrombosed internal hemorrhoids.   Colonoscopy August 26, 2017 - One 6 mm polyp in the ascending colon, removed with a cold snare. Resected and retrieved. - One 18 mm polyp in the ascending colon, removed piecemeal using a cold snare. Resected and retrieved. - Moderate diverticulosis in the sigmoid colon. Peri-diverticular erythema was seen. Biopsied to exclude segmental colitis associated with diverticulosis. - Non-bleeding internal hemorrhoids.    Interval History    Patient returns for follow-up on altered bowel movements.Patient started Mounjaro  in November 2025 for DM and reports since then her bowels have been more regular. She has history of chronic constipation. She reports now she has regular BM most days 1-2 per day.  Patient also has GERD that is controlled on Omeprazole  40 mg po daily. No dysphagia. Overall, she is doing very well. No new complaints.  Wt Readings from Last 3 Encounters:  05/28/23 159 lb 2 oz (72.2 kg)  03/25/23 158 lb 6.4 oz (71.8 kg)  02/15/23 158 lb 12.8 oz (72 kg)    Past Medical History:  Diagnosis Date   Anemia    as a child    Arthritis    CAD, NATIVE VESSEL 05/13/2009   Cataracts, bilateral    MD just watching    Complication of anesthesia    DIABETES MELLITUS, TYPE II 12/09/2009   Gastric ulcer    Gastritis    GERD (gastroesophageal reflux disease)    Hx of abnormal cervical Pap smear    cryo  but normal since then    HYPERLIPIDEMIA-MIXED 04/14/2008   Intestinal metaplasia of gastric mucosa 2012   Myocardial infarction (HCC)    2003   Obesity    Occult blood in stools    Osteopenia    OVERWEIGHT/OBESITY 05/13/2009   Thyroid  disease    Ulcer    Duodenal ulcer by x-ray 40 years ago   Varicose veins    under rx lawson 2016    Past Surgical History:  Procedure Laterality Date   BIOPSY  09/29/2018   Procedure: BIOPSY;  Surgeon: Normie Becton., MD;  Location: [facility] ENDOSCOPY;  Service: Gastroenterology;;   BIOPSY  01/14/2020   Procedure: BIOPSY;  Surgeon: Normie Becton., MD;  Location: [facility] ENDOSCOPY;  Service: Gastroenterology;;   CESAREAN SECTION     x2   CLOSED REDUCTION PROXIMAL TIBIOFIBULAR JOINT DISLOCATON  2004   left   COLONOSCOPY     COLONOSCOPY WITH PROPOFOL  N/A 09/29/2018   Procedure: COLONOSCOPY WITH PROPOFOL ;  Surgeon: Normie Becton., MD;  Location: [facility] ENDOSCOPY;  Service: Gastroenterology;  Laterality: N/A;   DILATION AND CURETTAGE OF UTERUS     ECTOPIC PREGNANCY SURGERY     ESOPHAGOGASTRODUODENOSCOPY N/A 02/12/2018   Procedure: ESOPHAGOGASTRODUODENOSCOPY (EGD);  Surgeon: Normie Becton., MD;  Location: [facility] ENDOSCOPY;  Service: Gastroenterology;  Laterality: N/A;   ESOPHAGOGASTRODUODENOSCOPY N/A 09/29/2018   Procedure: ESOPHAGOGASTRODUODENOSCOPY (EGD);  Surgeon:  Mansouraty, Albino Alu., MD;  Location: [facility] ENDOSCOPY;  Service: Gastroenterology;  Laterality: N/A;   ESOPHAGOGASTRODUODENOSCOPY (EGD) WITH PROPOFOL  N/A 01/14/2020   Procedure: ESOPHAGOGASTRODUODENOSCOPY (EGD) WITH PROPOFOL ;  Surgeon: Brice Campi Albino Alu., MD;  Location: [facility] ENDOSCOPY;  Service: Gastroenterology;  Laterality: N/A;   EUS N/A 02/12/2018   Procedure: UPPER ENDOSCOPIC ULTRASOUND  (EUS) RADIAL;  Surgeon: Brice Campi Albino Alu., MD;  Location: [facility] ENDOSCOPY;  Service: Gastroenterology;  Laterality: N/A;   FIBULA FRACTURE SURGERY     GANGLION CYST EXCISION     POLYPECTOMY  02/12/2018   Procedure: POLYPECTOMY;  Surgeon: Mansouraty, Albino Alu., MD;  Location: [facility] ENDOSCOPY;  Service: Gastroenterology;;   POLYPECTOMY  01/14/2020   Procedure: POLYPECTOMY;  Surgeon: Normie Becton., MD;  Location: [facility] ENDOSCOPY;  Service: Gastroenterology;;   TONSILLECTOMY     TUBAL LIGATION     One Tube   UPPER GASTROINTESTINAL ENDOSCOPY     08/27/17    Current Outpatient Medications  Medication Sig Dispense Refill   acetaminophen (TYLENOL) 500 MG tablet Take 1,000 mg by mouth every 6 (six) hours as needed for moderate pain or headache.     aspirin 81 MG tablet Take 81 mg by mouth daily.       atorvastatin  (LIPITOR) 80 MG tablet TAKE 1 TABLET BY MOUTH ONCE DAILY AT 6PM 90 tablet 3   canagliflozin  (INVOKANA ) 300 MG TABS tablet Take 1 tablet (300 mg total) by mouth daily before breakfast. 90 tablet 2   Cholecalciferol (VITAMIN D3) 125 MCG (5000 UT) CAPS Take 5,000 Units by mouth daily.     Cranberry-Vitamin C-Vitamin E (CRANBERRY PLUS VITAMIN C) 4200-20-3 MG-MG-UNIT CAPS Take 2 capsules by mouth daily.     escitalopram  (LEXAPRO ) 10 MG tablet TAKE 1 TABLET BY MOUTH AT BEDTIME 90 tablet 1   ezetimibe  (ZETIA ) 10 MG tablet TAKE 1 TABLET BY MOUTH ONCE DAILY 90 tablet 2   Ferrous Sulfate (IRON) 325 (65 Fe) MG TABS Take 650 mg by mouth daily.      glimepiride  (AMARYL ) 1 MG tablet TAKE 1 TABLET BY MOUTH DAILY WITH BREAKFAST 90 tablet 2   glucose blood (ONETOUCH ULTRA) test strip Use as instructed 100 strip 1   Lancet Devices (ONE TOUCH DELICA LANCING DEV) MISC Use as directed to check blood sugars twice to four time per day. Dx E11.9 1 each 11   Lancets (ONETOUCH DELICA PLUS LANCET33G) MISC USE TO CHECK BLOOD SUGAR 2-3 TIMES DAILY 100 each 1   levothyroxine  (SYNTHROID ) 50 MCG tablet TAKE 1  TABLET BY MOUTH ONCE DAILY 90 tablet 3   LORazepam  (ATIVAN ) 0.5 MG tablet Take 1-2 tabs    ( 0.5-1 mg) pre procedure 8 tablet 0   metFORMIN  (GLUCOPHAGE -XR) 500 MG 24 hr tablet TAKE 2 TABLETS BY MOUTH TWICE DAILY WITH MEALS 360 tablet 2   MOUNJARO  7.5 MG/0.5ML Pen Inject 7.5 mg into the skin once a week. 6 mL 2   Multiple Vitamins-Minerals (MULTIVITAMIN PO) Take 1 tablet by mouth daily.      Omega-3 Fatty Acids (FISH OIL) 1000 MG CAPS Take 2,000 mg by mouth daily.     omeprazole  (PRILOSEC) 40 MG capsule Take 1 capsule (40 mg total) by mouth 2 (two) times daily. 180 capsule 0   PFIZER-BIONT COVID-19 VAC-TRIS SUSP injection      tretinoin (RETIN-A) 0.025 % cream Apply topically.     vitamin B-12 (CYANOCOBALAMIN ) 1000 MCG tablet Take 2,000 mcg by mouth daily.     No current facility-administered medications for this visit.  Allergies as of 05/28/2023 - Review Complete 05/28/2023  Allergen Reaction Noted   Penicillins Rash 02/24/2010    Family History  Problem Relation Age of Onset   Emphysema Mother        Smoker   Heart attack Mother        due to head injury   Alcohol abuse Mother    Heart disease Mother    Early death Mother    Varicose Veins Mother    AAA (abdominal aortic aneurysm) Mother    Lung cancer Father    Heart attack Father    Hypertension Father    Alcohol abuse Father    Heart disease Father        before age 7   Aneurysm Father    Hyperlipidemia Father    Alcohol abuse Brother    Diabetes Brother    Heart disease Brother    Heart attack Brother    Diabetes Brother    Aneurysm Paternal Grandmother    Diabetes Other        grandfather   Sudden Cardiac Death Brother    Colon cancer Neg Hx    Esophageal cancer Neg Hx    Stomach cancer Neg Hx    Inflammatory bowel disease Neg Hx    Liver disease Neg Hx    Pancreatic cancer Neg Hx    Rectal cancer Neg Hx     Review of Systems:    Constitutional: No weight loss, fever, chills, weakness or  fatigue HEENT: Eyes: No change in vision               Ears, Nose, Throat:  No change in hearing or congestion Skin: No rash or itching Cardiovascular: No chest pain, chest pressure or palpitations   Respiratory: No SOB or cough Gastrointestinal: See HPI and otherwise negative Genitourinary: No dysuria or change in urinary frequency Neurological: No headache, dizziness or syncope Musculoskeletal: No new muscle or joint pain Hematologic: No bleeding or bruising Psychiatric: No history of depression or anxiety    Physical Exam:  Vital signs: BP 110/60 (BP Location: Left Arm, Patient Position: Sitting, Cuff Size: Normal)   Pulse 60   Ht 5\' 5"  (1.651 m)   Wt 159 lb 2 oz (72.2 kg)   LMP  (LMP Unknown)   BMI 26.48 kg/m   Constitutional: Pleasant  female appears to be in NAD, Well developed, Well nourished, alert and cooperative Throat: Oral cavity and pharynx without inflammation, swelling or lesion.  Respiratory: Respirations even and unlabored. Lungs clear to auscultation bilaterally.   No wheezes, crackles, or rhonchi.  Cardiovascular: Normal S1, S2. Regular rate and rhythm. No peripheral edema, cyanosis or pallor.  Gastrointestinal:  Soft, nondistended, nontender. No rebound or guarding. Normal bowel sounds. No appreciable masses or hepatomegaly. Rectal:  Not performed.  Msk:  Symmetrical without gross deformities. Without edema, no deformity or joint abnormality.  Neurologic:  Alert and  oriented x4;  grossly normal neurologically.  Skin:   Dry and intact without significant lesions or rashes. Psychiatric: Oriented to person, place and time. Demonstrates good judgement and reason without abnormal affect or behaviors.  RELEVANT LABS AND IMAGING: CBC    Latest Ref Rng & Units 10/17/2022   10:24 AM 08/14/2021    9:48 AM 07/22/2020    8:17 AM  CBC  WBC 4.0 - 10.5 K/uL 8.1  6.3  3.7   Hemoglobin 12.0 - 15.0 g/dL 45.4  09.8  11.9   Hematocrit 36.0 - 46.0 % 45.6  43.8  41.5    Platelets 150.0 - 400.0 K/uL 217.0  198.0  189.0      CMP     Latest Ref Rng & Units 10/17/2022   10:24 AM 08/14/2021    9:48 AM 07/22/2020    8:17 AM  CMP  Glucose 70 - 99 mg/dL 295  284  132   BUN 6 - 23 mg/dL 10  15  7    Creatinine 0.40 - 1.20 mg/dL 4.40  1.02  7.25   Sodium 135 - 145 mEq/L 133  133  138   Potassium 3.5 - 5.1 mEq/L 4.5  4.3  4.2   Chloride 96 - 112 mEq/L 96  95  99   CO2 19 - 32 mEq/L 29  28  30    Calcium  8.4 - 10.5 mg/dL 36.6  44.0  9.6   Total Protein 6.0 - 8.3 g/dL 6.8  6.8  6.4   Total Bilirubin 0.2 - 1.2 mg/dL 1.0  0.9  0.7   Alkaline Phos 39 - 117 U/L 64  64  54   AST 0 - 37 U/L 23  19  16    ALT 0 - 35 U/L 27  25  19       Lab Results  Component Value Date   TSH 3.31 10/17/2022     Assessment: Encounter Diagnoses  Name Primary?   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 Surveillance EGD in 2021 was negative for recurrence. Surveillance EGD 05/2022 showed mild chronic gastritis with negative bx.  Patients altered bowel habits have regulated with starting GLP-1 and no longer having issues. Her GERD is also managed with current Omeprazole  40mg  po daily. No changes needed at this time.  Plan: - Continue Prilosec 40 mg po daily -follow-up as needed -History of advanced adenomatous colon polyps: recall colonoscopy 05/2027  Thank you for the courtesy of this consult. Please call me with any questions or concerns.   Lamaya Hyneman, FNP-C [facility] 05/28/2023, 4:06 PM  Cc: Panosh, Wanda K, MD

## 2023-06-03 ENCOUNTER — Encounter: Payer: Self-pay | Admitting: Internal Medicine

## 2023-06-04 NOTE — Telephone Encounter (Signed)
 Not sure   if rheumatology is the best way to go  yet  ...takes a while to get in and usually need previous evaluation  to review    all .  What did the ortho team suggest/  did they they plan a fu  evaluation for the knee  or think back issues could be related ?  Did they suggest a rheum assessment?

## 2023-06-10 NOTE — Telephone Encounter (Signed)
Agree with her plan

## 2023-06-11 DIAGNOSIS — M25562 Pain in left knee: Secondary | ICD-10-CM | POA: Diagnosis not present

## 2023-06-18 ENCOUNTER — Other Ambulatory Visit: Payer: Self-pay | Admitting: Internal Medicine

## 2023-06-19 NOTE — Progress Notes (Unsigned)
 No chief complaint on file.   HPI: Morgan [name] 73 y.o. come in for Chronic disease management  Feels well   now retired  Dm :   on 7.5 mounjara and low dose amaryl  now and no lows doing very well.  GERD  reflux  doing ok  Morgan [name]  SKIN stable  using topical  Thyroid :  no change  HLD atorvastatin   Lexapro :  doing well wants to remain on curren meds  Ortho knee and sciatica  topical nsaid  to do pt   Sleep 8- 9  hours  CAD   sees  dr Audery Blazing yearly  Grewal did dexa and "ok"  ROS: See pertinent positives and negatives per HPI.  Past Medical History:  Diagnosis Date   Anemia    as a child    Arthritis    CAD, NATIVE VESSEL 05/13/2009   Cataracts, bilateral    MD just watching   Complication of anesthesia    DIABETES MELLITUS, TYPE II 12/09/2009   Gastric ulcer    Gastritis    GERD (gastroesophageal reflux disease)    Hx of abnormal cervical Pap smear    cryo  but normal since then    HYPERLIPIDEMIA-MIXED 04/14/2008   Intestinal metaplasia of gastric mucosa 2012   Myocardial infarction (HCC)    2003   Obesity    Occult blood in stools    Osteopenia    OVERWEIGHT/OBESITY 05/13/2009   Thyroid  disease    Ulcer    Duodenal ulcer by x-ray 40 years ago   Varicose veins    under rx lawson 2016    Family History  Problem Relation Age of Onset   Emphysema Mother        Smoker   Heart attack Mother        due to head injury   Alcohol abuse Mother    Heart disease Mother    Early death Mother    Varicose Veins Mother    AAA (abdominal aortic aneurysm) Mother    Lung cancer Father    Heart attack Father    Hypertension Father    Alcohol abuse Father    Heart disease Father        before age 32   Aneurysm Father    Hyperlipidemia Father    Alcohol abuse Brother    Diabetes Brother    Heart disease Brother    Heart attack Brother    Diabetes Brother    Aneurysm Paternal Grandmother    Diabetes Other        grandfather   Sudden Cardiac Death Brother     Colon cancer Neg Hx    Esophageal cancer Neg Hx    Stomach cancer Neg Hx    Inflammatory bowel disease Neg Hx    Liver disease Neg Hx    Pancreatic cancer Neg Hx    Rectal cancer Neg Hx     Social History   Socioeconomic History   Marital status: Widowed    Spouse name: Not on file   Number of children: 2   Years of education: phd    Highest education level: Not on file  Occupational History   Occupation: Mental Health Counselor    Comment: [employer]   Occupation: Glass blower/designer: [employer]  Tobacco Use   Smoking status: Former    Current packs/day: 0.00    Types: Cigarettes    Quit date: 02/05/1994    Years since quitting:  29.3   Smokeless tobacco: Never  Vaping Use   Vaping status: Never Used  Substance and Sexual Activity   Alcohol use: No    Alcohol/week: 0.0 standard drinks of alcohol   Drug use: No   Sexual activity: Not on file  Other Topics Concern   Not on file  Social History Narrative   HH of 2 currently    No tobacco    Separated     Recently widowed   5 14       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    No firearms    NO reg exercise    Pets- Poodles 2    Sleep 7 hours    Social Drivers of Corporate investment banker Strain: Low Risk  (10/12/2022)   Overall Financial Resource Strain (CARDIA)    Difficulty of Paying Living Expenses: Not hard at all  Food Insecurity: No Food Insecurity (10/12/2022)   Hunger Vital Sign    Worried About Running Out of Food in the Last Year: Never true    Ran Out of Food in the Last Year: Never true  Transportation Needs: No Transportation Needs (10/12/2022)   PRAPARE - Administrator, Civil Service (Medical): No    Lack of Transportation (Non-Medical): No  Physical Activity: Insufficiently Active (10/12/2022)   Exercise Vital Sign    Days of Exercise per Week: 1 day    Minutes of Exercise  per Session: 30 min  Stress: No Stress Concern Present (10/12/2022)   [employer] of Occupational Health - Occupational Stress Questionnaire    Feeling of Stress : Not at all  Social Connections: Unknown (10/12/2022)   Social Connection and Isolation Panel [NHANES]    Frequency of Communication with Friends and Family: More than three times a week    Frequency of Social Gatherings with Friends and Family: Twice a week    Attends Religious Services: Not on Insurance claims handler of Clubs or Organizations: Yes    Attends Banker Meetings: 1 to 4 times per year    Marital Status: Widowed    Outpatient Medications Prior to Visit  Medication Sig Dispense Refill   acetaminophen (TYLENOL) 500 MG tablet Take 1,000 mg by mouth every 6 (six) hours as needed for moderate pain or headache.     aspirin 81 MG tablet Take 81 mg by mouth daily.       atorvastatin  (LIPITOR) 80 MG tablet TAKE 1 TABLET BY MOUTH ONCE DAILY AT 6PM 90 tablet 3   canagliflozin  (INVOKANA ) 300 MG TABS tablet Take 1 tablet (300 mg total) by mouth daily before breakfast. 90 tablet 2   Cholecalciferol (VITAMIN D3) 125 MCG (5000 UT) CAPS Take 5,000 Units by mouth daily.     Cranberry-Vitamin C-Vitamin E (CRANBERRY PLUS VITAMIN C) 4200-20-3 MG-MG-UNIT CAPS Take 2 capsules by mouth daily.     escitalopram  (LEXAPRO ) 10 MG tablet TAKE 1 TABLET BY MOUTH AT BEDTIME 90 tablet 1   ezetimibe  (ZETIA ) 10 MG tablet TAKE 1 TABLET BY MOUTH ONCE DAILY 90 tablet 2   Ferrous Sulfate (IRON) 325 (65 Fe) MG TABS Take 650 mg by mouth daily.      glimepiride  (AMARYL ) 1 MG tablet TAKE 1 TABLET BY MOUTH DAILY WITH BREAKFAST 90 tablet 2   glucose blood (ONETOUCH ULTRA) test strip Use to test blood  sugar two to three times daily. 100 each 0   Lancet Devices (ONE TOUCH DELICA LANCING DEV) MISC Use as directed to check blood sugars twice to four time per day. Dx E11.9 1 each 11   Lancets (ONETOUCH DELICA PLUS LANCET33G) MISC USE TO CHECK BLOOD  SUGAR 2-3 TIMES DAILY 100 each 1   levothyroxine  (SYNTHROID ) 50 MCG tablet TAKE 1 TABLET BY MOUTH ONCE DAILY 90 tablet 3   LORazepam  (ATIVAN ) 0.5 MG tablet Take 1-2 tabs    ( 0.5-1 mg) pre procedure 8 tablet 0   metFORMIN  (GLUCOPHAGE -XR) 500 MG 24 hr tablet TAKE 2 TABLETS BY MOUTH TWICE DAILY WITH MEALS 360 tablet 2   MOUNJARO  7.5 MG/0.5ML Pen Inject 7.5 mg into the skin once a week. 6 mL 2   Multiple Vitamins-Minerals (MULTIVITAMIN PO) Take 1 tablet by mouth daily.      Omega-3 Fatty Acids (FISH OIL) 1000 MG CAPS Take 2,000 mg by mouth daily.     omeprazole  (PRILOSEC) 40 MG capsule Take 1 capsule (40 mg total) by mouth 2 (two) times daily. 180 capsule 0   PFIZER-BIONT COVID-19 VAC-TRIS SUSP injection      tretinoin (RETIN-A) 0.025 % cream Apply topically.     vitamin B-12 (CYANOCOBALAMIN ) 1000 MCG tablet Take 2,000 mcg by mouth daily.     No facility-administered medications prior to visit.     EXAM:  BP 108/60 (BP Location: Left Arm, Patient Position: Sitting, Cuff Size: Normal)   Pulse 61   Temp (!) 97.5 F (36.4 C) (Oral)   Wt 154 lb 9.6 oz (70.1 kg)   LMP  (LMP Unknown)   SpO2 97%   BMI 25.73 kg/m   Body mass index is 25.73 kg/m.  GENERAL: vitals reviewed and listed above, alert, oriented, appears well hydrated and in no acute distress HEENT: atraumatic, conjunctiva  clear, no obvious abnormalities on inspection of external nose and ears  tms clear  NECK: no obvious masses on inspection palpation  LUNGS: clear to auscultation bilaterally, no wheezes, rales or rhonchi, good air movement CV: HRRR, no clubbing cyanosis or  peripheral edema nl cap refill  Abdomen:  Sof,t normal bowel sounds without hepatosplenomegaly, no guarding rebound or masses no CVA tenderness MS: moves all extremities without noticeable focal  abnormality Skin sks some  and vv  but no acute findings  PSYCH: pleasant and cooperative, no obvious depression or anxiety Lab Results  Component Value Date    WBC 8.1 10/17/2022   HGB 15.1 (H) 10/17/2022   HCT 45.6 10/17/2022   PLT 217.0 10/17/2022   GLUCOSE 141 (H) 10/17/2022   CHOL 129 10/17/2022   TRIG 86.0 10/17/2022   HDL 54.50 10/17/2022   LDLCALC 58 10/17/2022   ALT 27 10/17/2022   AST 23 10/17/2022   NA 133 (L) 10/17/2022   K 4.5 10/17/2022   CL 96 10/17/2022   CREATININE 0.67 10/17/2022   BUN 10 10/17/2022   CO2 29 10/17/2022   TSH 3.31 10/17/2022   INR 0.9 01/13/2018   HGBA1C 7.0 (A) 03/25/2023   MICROALBUR <0.7 10/17/2022   BP Readings from Last 3 Encounters:  06/20/23 108/60  05/28/23 110/60  03/25/23 120/70    ASSESSMENT AND PLAN:  Discussed the following assessment and plan:  Subclinical hypothyroidism - Plan: Vitamin B12, Basic metabolic panel with GFR, CBC with Differential/Platelet, Hemoglobin A1c, Hepatic function panel, Lipid panel, TSH, Microalbumin / creatinine urine ratio  Medication management - cont lexapro  - Plan: Vitamin B12, Basic metabolic panel  with GFR, CBC with Differential/Platelet, Hemoglobin A1c, Hepatic function panel, Lipid panel, TSH, Microalbumin / creatinine urine ratio  Mixed hyperlipidemia - Plan: Vitamin B12, Basic metabolic panel with GFR, CBC with Differential/Platelet, Hemoglobin A1c, Hepatic function panel, Lipid panel, TSH, Microalbumin / creatinine urine ratio  Type 2 diabetes mellitus with hyperglycemia, without long-term current use of insulin  (HCC) - Plan: Vitamin B12, Basic metabolic panel with GFR, CBC with Differential/Platelet, Hemoglobin A1c, Hepatic function panel, Lipid panel, TSH, Microalbumin / creatinine urine ratio  Atherosclerosis of native coronary artery of native heart without angina pectoris - Plan: Vitamin B12, Basic metabolic panel with GFR, CBC with Differential/Platelet, Hemoglobin A1c, Hepatic function panel, Lipid panel, TSH, Microalbumin / creatinine urine ratio Doing well med monitoring  Conditions  controlled   if ok can do yearly since in care with  endocrine and cardiology. Continue meds  .  -Patient advised to return or notify health care team  if  new concerns arise.  Patient Instructions  Good to see  you today  Get fasting lab appt  Will share info with medical team .  If all ok then yearly check . Continue lifestyle intervention healthy eating and exercise .    Brinn Westby K. Knight Oelkers M.D.

## 2023-06-20 ENCOUNTER — Ambulatory Visit: Payer: PPO | Admitting: Internal Medicine

## 2023-06-20 ENCOUNTER — Encounter: Payer: Self-pay | Admitting: Internal Medicine

## 2023-06-20 VITALS — BP 108/60 | HR 61 | Temp 97.5°F | Wt 154.6 lb

## 2023-06-20 DIAGNOSIS — I2510 Atherosclerotic heart disease of native coronary artery without angina pectoris: Secondary | ICD-10-CM | POA: Diagnosis not present

## 2023-06-20 DIAGNOSIS — E1165 Type 2 diabetes mellitus with hyperglycemia: Secondary | ICD-10-CM | POA: Diagnosis not present

## 2023-06-20 DIAGNOSIS — E038 Other specified hypothyroidism: Secondary | ICD-10-CM | POA: Diagnosis not present

## 2023-06-20 DIAGNOSIS — E782 Mixed hyperlipidemia: Secondary | ICD-10-CM | POA: Diagnosis not present

## 2023-06-20 DIAGNOSIS — Z79899 Other long term (current) drug therapy: Secondary | ICD-10-CM | POA: Diagnosis not present

## 2023-06-20 NOTE — Patient Instructions (Signed)
 Good to see  you today  Get fasting lab appt  Will share info with medical team .  If all ok then yearly check . Continue lifestyle intervention healthy eating and exercise .

## 2023-06-26 ENCOUNTER — Ambulatory Visit: Payer: Self-pay | Admitting: Internal Medicine

## 2023-06-26 ENCOUNTER — Other Ambulatory Visit (INDEPENDENT_AMBULATORY_CARE_PROVIDER_SITE_OTHER)

## 2023-06-26 DIAGNOSIS — I2510 Atherosclerotic heart disease of native coronary artery without angina pectoris: Secondary | ICD-10-CM | POA: Diagnosis not present

## 2023-06-26 DIAGNOSIS — E1165 Type 2 diabetes mellitus with hyperglycemia: Secondary | ICD-10-CM

## 2023-06-26 DIAGNOSIS — E038 Other specified hypothyroidism: Secondary | ICD-10-CM

## 2023-06-26 DIAGNOSIS — E782 Mixed hyperlipidemia: Secondary | ICD-10-CM | POA: Diagnosis not present

## 2023-06-26 DIAGNOSIS — Z79899 Other long term (current) drug therapy: Secondary | ICD-10-CM | POA: Diagnosis not present

## 2023-06-26 LAB — CBC WITH DIFFERENTIAL/PLATELET
Basophils Absolute: 0 10*3/uL (ref 0.0–0.1)
Basophils Relative: 0.4 % (ref 0.0–3.0)
Eosinophils Absolute: 0 10*3/uL (ref 0.0–0.7)
Eosinophils Relative: 0.7 % (ref 0.0–5.0)
HCT: 42 % (ref 36.0–46.0)
Hemoglobin: 14.3 g/dL (ref 12.0–15.0)
Lymphocytes Relative: 27.4 % (ref 12.0–46.0)
Lymphs Abs: 1.6 10*3/uL (ref 0.7–4.0)
MCHC: 34 g/dL (ref 30.0–36.0)
MCV: 91.9 fl (ref 78.0–100.0)
Monocytes Absolute: 0.3 10*3/uL (ref 0.1–1.0)
Monocytes Relative: 5.8 % (ref 3.0–12.0)
Neutro Abs: 3.9 10*3/uL (ref 1.4–7.7)
Neutrophils Relative %: 65.7 % (ref 43.0–77.0)
Platelets: 214 10*3/uL (ref 150.0–400.0)
RBC: 4.57 Mil/uL (ref 3.87–5.11)
RDW: 13.5 % (ref 11.5–15.5)
WBC: 5.9 10*3/uL (ref 4.0–10.5)

## 2023-06-26 LAB — HEPATIC FUNCTION PANEL
ALT: 20 U/L (ref 0–35)
AST: 17 U/L (ref 0–37)
Albumin: 4.4 g/dL (ref 3.5–5.2)
Alkaline Phosphatase: 56 U/L (ref 39–117)
Bilirubin, Direct: 0.2 mg/dL (ref 0.0–0.3)
Total Bilirubin: 0.8 mg/dL (ref 0.2–1.2)
Total Protein: 6.4 g/dL (ref 6.0–8.3)

## 2023-06-26 LAB — BASIC METABOLIC PANEL WITH GFR
BUN: 10 mg/dL (ref 6–23)
CO2: 24 meq/L (ref 19–32)
Calcium: 9.2 mg/dL (ref 8.4–10.5)
Chloride: 99 meq/L (ref 96–112)
Creatinine, Ser: 0.57 mg/dL (ref 0.40–1.20)
GFR: 90.91 mL/min (ref >60.00)
Glucose, Bld: 162 mg/dL — ABNORMAL HIGH (ref 70–99)
Potassium: 3.8 meq/L (ref 3.5–5.1)
Sodium: 134 meq/L — ABNORMAL LOW (ref 135–145)

## 2023-06-26 LAB — MICROALBUMIN / CREATININE URINE RATIO
Creatinine,U: 55.2 mg/dL
Microalb Creat Ratio: UNDETERMINED mg/g (ref 0.0–30.0)
Microalb, Ur: <0.7 mg/dL

## 2023-06-26 LAB — HEMOGLOBIN A1C: Hgb A1c MFr Bld: 6.7 % — ABNORMAL HIGH (ref 4.6–6.5)

## 2023-06-26 LAB — LIPID PANEL
Cholesterol: 128 mg/dL (ref 0–200)
HDL: 46 mg/dL (ref >39.00)
LDL Cholesterol: 66 mg/dL (ref 0–99)
NonHDL: 82.13
Total CHOL/HDL Ratio: 3
Triglycerides: 79 mg/dL (ref 0.0–149.0)
VLDL: 15.8 mg/dL (ref 0.0–40.0)

## 2023-06-26 LAB — TSH: TSH: 2.85 u[IU]/mL (ref 0.35–5.50)

## 2023-06-26 LAB — VITAMIN B12: Vitamin B-12: 1258 pg/mL — ABNORMAL HIGH (ref 211–911)

## 2023-06-26 NOTE — Progress Notes (Signed)
 A1c improving , serum sodium is  still boderline low  could be the lexapro   should be followed of med trial off   consider.. rest of results are ok or in range at goal   vit b12 high so can decrease days taking. Sharing with dr  Rosalea Collin

## 2023-06-27 DIAGNOSIS — M25562 Pain in left knee: Secondary | ICD-10-CM | POA: Diagnosis not present

## 2023-07-11 DIAGNOSIS — M00162 Pneumococcal arthritis, left knee: Secondary | ICD-10-CM | POA: Diagnosis not present

## 2023-07-11 DIAGNOSIS — M5416 Radiculopathy, lumbar region: Secondary | ICD-10-CM | POA: Diagnosis not present

## 2023-07-22 ENCOUNTER — Ambulatory Visit: Payer: PPO | Admitting: Internal Medicine

## 2023-07-22 ENCOUNTER — Encounter: Payer: Self-pay | Admitting: Internal Medicine

## 2023-07-22 VITALS — BP 110/70 | HR 60 | Ht 65.0 in | Wt 152.0 lb

## 2023-07-22 DIAGNOSIS — E1159 Type 2 diabetes mellitus with other circulatory complications: Secondary | ICD-10-CM | POA: Diagnosis not present

## 2023-07-22 DIAGNOSIS — Z7985 Long-term (current) use of injectable non-insulin antidiabetic drugs: Secondary | ICD-10-CM

## 2023-07-22 DIAGNOSIS — Z7984 Long term (current) use of oral hypoglycemic drugs: Secondary | ICD-10-CM | POA: Diagnosis not present

## 2023-07-22 DIAGNOSIS — E785 Hyperlipidemia, unspecified: Secondary | ICD-10-CM

## 2023-07-22 MED ORDER — TIRZEPATIDE 10 MG/0.5ML ~~LOC~~ SOAJ: 10.0000 mg | SUBCUTANEOUS | 3 refills | Status: DC

## 2023-07-22 NOTE — Progress Notes (Signed)
 Name: Morgan [name]  MRN/ DOB: [id number], 1950/02/19   Age/ Sex: 73 y.o., female    PCP: Reginal Capra, MD   Reason for Endocrinology Evaluation: Type 2 Diabetes Mellitus     Date of Initial Endocrinology Visit: 12/24/2022    PATIENT IDENTIFIER: Morgan [name] is a 73 y.o. female with a past medical history of DM, CAD, dyslipidemia. The patient presented for initial endocrinology clinic visit on 12/24/2022 for consultative assistance with her diabetes management.    HPI: Morgan [name] was    Diagnosed with DM 2004 Prior Medications tried/Intolerance: Byetta -hyperglycemia, Trulicity-hyperglycemia, Rybelsus -hyperglycemia, Victoza -hypoglycemia, Ozempic -hyperglycemia.  Started Mounjaro  10/2022              Hemoglobin A1c has ranged from 6.7% in 2022, peaking at 8.5% in 2024.   Maternal grandfather with DM , brothers with DM   On her initial visit to our clinic she had an A1c of 8.8%, she was on metformin , Invokana , and Mounjaro .  I increase Mounjaro  and started her on glimepiride    SUBJECTIVE:   During the last visit (03/25/2023): A1c 7.0%    Today (07/22/23): Morgan [name] is here for follow-up on diabetes management.  She checks her blood sugars 2 times daily. The patient has not had hypoglycemic episodes since the last clinic visit.   Patient had a follow-up with GI for chronic gastritis with gastric ulcer, intestinal metaplasia, s/p  removal of 15 mm gastric prepyloric nodule with atypia.  Patient has been noted weight loss Has rare constipation Denies nausea or vomiting    HOME DIABETES REGIMEN: Metformin  500 mg XR, 2 tabs BID Glimepiride  1 mg daily Invokana  300 mg daily Mounjaro  7.5 mg weekly   Statin: Yes ACE-I/ARB: No   METER DOWNLOAD SUMMARY: Date range evaluated: 5/18-6/16/2025 Fingerstick Blood Glucose Tests = 30 Average Number Tests/Day = 1 Overall Mean FS Glucose = 130 Standard Deviation = 10  BG Ranges: Low = 115 High =  150   Hypoglycemic Events/30 Days: BG < 50 = 0 Episodes of symptomatic severe hypoglycemia = 0   DIABETIC COMPLICATIONS: Microvascular complications:  Denies: CKD, DR, neuropathy  Last eye exam: Completed 12/2021  Macrovascular complications:  CAD Denies:  PVD, CVA   PAST HISTORY: Past Medical History:  Past Medical History:  Diagnosis Date   Anemia    as a child    Arthritis    CAD, NATIVE VESSEL 05/13/2009   Cataracts, bilateral    MD just watching   Complication of anesthesia    DIABETES MELLITUS, TYPE II 12/09/2009   Gastric ulcer    Gastritis    GERD (gastroesophageal reflux disease)    Hx of abnormal cervical Pap smear    cryo  but normal since then    HYPERLIPIDEMIA-MIXED 04/14/2008   Intestinal metaplasia of gastric mucosa 2012   Myocardial infarction (HCC)    2003   Obesity    Occult blood in stools    Osteopenia    OVERWEIGHT/OBESITY 05/13/2009   Thyroid  disease    Ulcer    Duodenal ulcer by x-ray 40 years ago   Varicose veins    under rx lawson 2016   Past Surgical History:  Past Surgical History:  Procedure Laterality Date   BIOPSY  09/29/2018   Procedure: BIOPSY;  Surgeon: Normie Becton., MD;  Location: [facility] ENDOSCOPY;  Service: Gastroenterology;;   BIOPSY  01/14/2020   Procedure: BIOPSY;  Surgeon: Normie Becton., MD;  Location: [facility] ENDOSCOPY;  Service: Gastroenterology;;   CESAREAN  SECTION     x2   CLOSED REDUCTION PROXIMAL TIBIOFIBULAR JOINT DISLOCATON  2004   left   COLONOSCOPY     COLONOSCOPY WITH PROPOFOL  N/A 09/29/2018   Procedure: COLONOSCOPY WITH PROPOFOL ;  Surgeon: Mansouraty, Albino Alu., MD;  Location: [facility] ENDOSCOPY;  Service: Gastroenterology;  Laterality: N/A;   DILATION AND CURETTAGE OF UTERUS     ECTOPIC PREGNANCY SURGERY     ESOPHAGOGASTRODUODENOSCOPY N/A 02/12/2018   Procedure: ESOPHAGOGASTRODUODENOSCOPY (EGD);  Surgeon: Normie Becton., MD;  Location: [facility] ENDOSCOPY;  Service: Gastroenterology;   Laterality: N/A;   ESOPHAGOGASTRODUODENOSCOPY N/A 09/29/2018   Procedure: ESOPHAGOGASTRODUODENOSCOPY (EGD);  Surgeon: Normie Becton., MD;  Location: [facility] ENDOSCOPY;  Service: Gastroenterology;  Laterality: N/A;   ESOPHAGOGASTRODUODENOSCOPY (EGD) WITH PROPOFOL  N/A 01/14/2020   Procedure: ESOPHAGOGASTRODUODENOSCOPY (EGD) WITH PROPOFOL ;  Surgeon: Brice Campi Albino Alu., MD;  Location: [facility] ENDOSCOPY;  Service: Gastroenterology;  Laterality: N/A;   EUS N/A 02/12/2018   Procedure: UPPER ENDOSCOPIC ULTRASOUND (EUS) RADIAL;  Surgeon: Brice Campi Albino Alu., MD;  Location: [facility] ENDOSCOPY;  Service: Gastroenterology;  Laterality: N/A;   FIBULA FRACTURE SURGERY     GANGLION CYST EXCISION     POLYPECTOMY  02/12/2018   Procedure: POLYPECTOMY;  Surgeon: Mansouraty, Albino Alu., MD;  Location: [facility] ENDOSCOPY;  Service: Gastroenterology;;   POLYPECTOMY  01/14/2020   Procedure: POLYPECTOMY;  Surgeon: Normie Becton., MD;  Location: [facility] ENDOSCOPY;  Service: Gastroenterology;;   TONSILLECTOMY     TUBAL LIGATION     One Tube   UPPER GASTROINTESTINAL ENDOSCOPY     08/27/17    Social History:  reports that she quit smoking about 29 years ago. Her smoking use included cigarettes. She has never used smokeless tobacco. She reports that she does not drink alcohol and does not use drugs. Family History:  Family History  Problem Relation Age of Onset   Emphysema Mother        Smoker   Heart attack Mother        due to head injury   Alcohol abuse Mother    Heart disease Mother    Early death Mother    Varicose Veins Mother    AAA (abdominal aortic aneurysm) Mother    Lung cancer Father    Heart attack Father    Hypertension Father    Alcohol abuse Father    Heart disease Father        before age 1   Aneurysm Father    Hyperlipidemia Father    Alcohol abuse Brother    Diabetes Brother    Heart disease Brother    Heart attack Brother    Diabetes Brother    Aneurysm Paternal Grandmother    Diabetes  Other        grandfather   Sudden Cardiac Death Brother    Colon cancer Neg Hx    Esophageal cancer Neg Hx    Stomach cancer Neg Hx    Inflammatory bowel disease Neg Hx    Liver disease Neg Hx    Pancreatic cancer Neg Hx    Rectal cancer Neg Hx      HOME MEDICATIONS: Allergies as of 07/22/2023       Reactions   Penicillins Rash   Did it involve swelling of the face/tongue/throat, SOB, or low BP? No Did it involve sudden or severe rash/hives, skin peeling, or any reaction on the inside of your mouth or nose? No Did you need to seek medical attention at a hospital or doctor's office? No When did it  last happen?      childhood allergy If all above answers are "NO", may proceed with cephalosporin use.        Medication List        Accurate as of July 22, 2023  9:59 AM. If you have any questions, ask your nurse or doctor.          acetaminophen 500 MG tablet Commonly known as: TYLENOL Take 1,000 mg by mouth every 6 (six) hours as needed for moderate pain or headache.   aspirin 81 MG tablet Take 81 mg by mouth daily.   atorvastatin  80 MG tablet Commonly known as: LIPITOR TAKE 1 TABLET BY MOUTH ONCE DAILY AT 6PM   canagliflozin  300 MG Tabs tablet Commonly known as: Invokana  Take 1 tablet (300 mg total) by mouth daily before breakfast.   Cranberry Plus Vitamin C 4200-20-3 MG-MG-UNIT Caps Generic drug: Cranberry-Vitamin C-Vitamin E Take 2 capsules by mouth daily.   cyanocobalamin  1000 MCG tablet Commonly known as: VITAMIN B12 Take 2,000 mcg by mouth daily.   escitalopram  10 MG tablet Commonly known as: LEXAPRO  TAKE 1 TABLET BY MOUTH AT BEDTIME What changed: how much to take   ezetimibe  10 MG tablet Commonly known as: ZETIA  TAKE 1 TABLET BY MOUTH ONCE DAILY   Fish Oil 1000 MG Caps Take 2,000 mg by mouth daily.   glimepiride  1 MG tablet Commonly known as: AMARYL  TAKE 1 TABLET BY MOUTH DAILY WITH BREAKFAST   Iron 325 (65 Fe) MG Tabs Take 650 mg by mouth  daily.   levothyroxine  50 MCG tablet Commonly known as: SYNTHROID  TAKE 1 TABLET BY MOUTH ONCE DAILY   LORazepam  0.5 MG tablet Commonly known as: ATIVAN  Take 1-2 tabs    ( 0.5-1 mg) pre procedure   metFORMIN  500 MG 24 hr tablet Commonly known as: GLUCOPHAGE -XR TAKE 2 TABLETS BY MOUTH TWICE DAILY WITH MEALS   Mounjaro  7.5 MG/0.5ML Pen Generic drug: tirzepatide  Inject 7.5 mg into the skin once a week.   MULTIVITAMIN PO Take 1 tablet by mouth daily.   omeprazole  40 MG capsule Commonly known as: PRILOSEC Take 1 capsule (40 mg total) by mouth 2 (two) times daily.   ONE TOUCH DELICA LANCING DEV Misc Use as directed to check blood sugars twice to four time per day. Dx E11.9   OneTouch Delica Plus Lancet33G Misc USE TO CHECK BLOOD SUGAR 2-3 TIMES DAILY   OneTouch Ultra test strip Generic drug: glucose blood Use to test blood sugar two to three times daily.   Pfizer-BioNT COVID-19 Vac-TriS Susp injection Generic drug: COVID-19 mRNA Vac-TriS (Pfizer)   tretinoin 0.025 % cream Commonly known as: RETIN-A Apply topically.   Vitamin D3 125 MCG (5000 UT) Caps Take 5,000 Units by mouth daily.         ALLERGIES: Allergies  Allergen Reactions   Penicillins Rash    Did it involve swelling of the face/tongue/throat, SOB, or low BP? No Did it involve sudden or severe rash/hives, skin peeling, or any reaction on the inside of your mouth or nose? No Did you need to seek medical attention at a hospital or doctor's office? No When did it last happen?      childhood allergy If all above answers are "NO", may proceed with cephalosporin use.      REVIEW OF SYSTEMS: A comprehensive ROS was conducted with the patient and is negative except as per HPI    OBJECTIVE:   VITAL SIGNS: BP 110/70 (BP Location: Left Arm, Patient Position:  Sitting, Cuff Size: Normal)   Pulse 60   Ht 5' 5 (1.651 m)   Wt 152 lb (68.9 kg)   LMP  (LMP Unknown)   SpO2 98%   BMI 25.29 kg/m      Filed  Weights   07/22/23 0952  Weight: 152 lb (68.9 kg)    PHYSICAL EXAM:  General: Pt appears well and is in NAD  Lungs: Clear with good BS bilat   Heart: RRR   Extremities:  Lower extremities - No pretibial edema.   Neuro: MS is good with appropriate affect, pt is alert and Ox3    DM foot exam: 12/24/2022  The skin of the feet is intact without sores or ulcerations. The pedal pulses are 2+ on right and 2+ on left. The sensation is intact to a screening 5.07, 10 gram monofilament bilaterally    DATA REVIEWED:  Lab Results  Component Value Date   HGBA1C 6.7 (H) 06/26/2023   HGBA1C 7.0 (A) 03/25/2023   HGBA1C 8.8 (A) 12/24/2022     Latest Reference Range & Units 06/26/23 08:22  Sodium 135 - 145 mEq/L 134 (L)  Potassium 3.5 - 5.1 mEq/L 3.8  Chloride 96 - 112 mEq/L 99  CO2 19 - 32 mEq/L 24  Glucose 70 - 99 mg/dL 161 (H)  BUN 6 - 23 mg/dL 10  Creatinine 0.96 - 0.45 mg/dL 4.09  Calcium  8.4 - 10.5 mg/dL 9.2  Alkaline Phosphatase 39 - 117 U/L 56  Albumin 3.5 - 5.2 g/dL 4.4  AST 0 - 37 U/L 17  ALT 0 - 35 U/L 20  Total Protein 6.0 - 8.3 g/dL 6.4  Bilirubin, Direct 0.0 - 0.3 mg/dL 0.2  Total Bilirubin 0.2 - 1.2 mg/dL 0.8  GFR >81.19 mL/min 90.91  Total CHOL/HDL Ratio  3  Cholesterol 0 - 200 mg/dL 147  HDL Cholesterol >82.95 mg/dL 62.13  LDL (calc) 0 - 99 mg/dL 66  NonHDL  08.65  Triglycerides 0.0 - 149.0 mg/dL 78.4  VLDL 0.0 - 69.6 mg/dL 29.5    Latest Reference Range & Units 06/26/23 08:33  Creatinine,U mg/dL 28.4  Microalb, Ur mg/dL <1.3  MICROALB/CREAT RATIO 0.0 - 30.0 mg/g Unable to calculate       ASSESSMENT / PLAN / RECOMMENDATIONS:   1) Type 2 Diabetes Mellitus, Optimally  controlled, With macrovascular complications - Most recent A1c of 6.7 %. Goal A1c < 7.0 %.    -A1c is optimal at 6.7% -The patient has been on Byetta , Trulicity, Victoza , with persistent hyperglycemia - She was also on Ozempic  2 mg with an A1c of 8.3% -She is currently on Mounjaro  7.5  mg dose, without any side effects, we have opted to increase the dose as below and discontinue glimepiride    MEDICATIONS: Stop Glimepiride  1 mg, 1 tablet before breakfast Continue metformin  500 mg XR, 2 tablets twice daily Continue Invokana  300 mg daily Increase Mounjaro  10 mg weekly  EDUCATION / INSTRUCTIONS: BG monitoring instructions: Patient is instructed to check her blood sugars 1 times a day. Call [place] [facility] if: BG persistently < 70  I reviewed the Rule of 15 for the treatment of hypoglycemia in detail with the patient. Literature supplied.   2) Diabetic complications:  Eye: Does not have known diabetic retinopathy.  Neuro/ Feet: Does not have known diabetic peripheral neuropathy. Renal: Patient does not have known baseline CKD. She is not on an ACEI/ARB at present.  3) Dyslipidemia:  - Recent lipid panel reviewed with optimal LDL, Tg -  Patient on atorvastatin  and Zetia   Follow-up in 4 months  Signed electronically by: Natale Bail, MD  [facility]  [facility] [address] [place] [place], [place] [address] Phone: [phone] FAX: [phone]   CC: Reginal Capra, MD [address] [place] [address] Phone: [phone]  Fax: [phone]    Return to Endocrinology clinic as below: Future Appointments  Date Time Provider Department Center  10/18/2023  8:00 AM [facility]-ANNUAL WELLNESS VISIT [facility]

## 2023-07-22 NOTE — Patient Instructions (Addendum)
 Stop Glimepiride  1 mg , 1 tablet before Breakfast  Continue Metformin  500 mg XR, 2 tablets twice a day  Continue Invokana  300 mg, 1 tablet every morning  Increase  Mounjaro  10 mg weekly    HOW TO TREAT LOW BLOOD SUGARS (Blood sugar LESS THAN 70 MG/DL) Please follow the RULE OF 15 for the treatment of hypoglycemia treatment (when your (blood sugars are less than 70 mg/dL)   STEP 1: Take 15 grams of carbohydrates when your blood sugar is low, which includes:  3-4 GLUCOSE TABS  OR 3-4 OZ OF JUICE OR REGULAR SODA OR ONE TUBE OF GLUCOSE GEL    STEP 2: RECHECK blood sugar in 15 MINUTES STEP 3: If your blood sugar is still low at the 15 minute recheck --> then, go back to STEP 1 and treat AGAIN with another 15 grams of carbohydrates.

## 2023-07-25 DIAGNOSIS — M5416 Radiculopathy, lumbar region: Secondary | ICD-10-CM | POA: Diagnosis not present

## 2023-08-05 ENCOUNTER — Other Ambulatory Visit: Payer: Self-pay | Admitting: Internal Medicine

## 2023-08-07 NOTE — Telephone Encounter (Signed)
 Follow up with pt. Pt reports she is weaning off of the medication. Currently is doing 5mg  then later off of it. She states she got plenty of the medication. She does not need a refill. No further action is needed at this time.

## 2023-08-08 ENCOUNTER — Encounter: Payer: Self-pay | Admitting: Internal Medicine

## 2023-08-15 ENCOUNTER — Other Ambulatory Visit: Payer: Self-pay | Admitting: Internal Medicine

## 2023-08-17 NOTE — Telephone Encounter (Signed)
 I dont have a particular   but    Can check into [facility]  speech and hearing dept  they have a good program and take medicare .  And have grad students and faculty .   Appointments  [facility]  Let me know if you need a referral

## 2023-09-12 ENCOUNTER — Encounter: Payer: Self-pay | Admitting: Internal Medicine

## 2023-10-02 ENCOUNTER — Other Ambulatory Visit: Payer: Self-pay | Admitting: Internal Medicine

## 2023-10-18 ENCOUNTER — Ambulatory Visit (INDEPENDENT_AMBULATORY_CARE_PROVIDER_SITE_OTHER): Payer: PPO

## 2023-10-18 VITALS — Ht 65.0 in | Wt 145.0 lb

## 2023-10-18 DIAGNOSIS — Z0000 Encounter for general adult medical examination without abnormal findings: Secondary | ICD-10-CM | POA: Diagnosis not present

## 2023-10-18 NOTE — Patient Instructions (Addendum)
 Morgan [name],  Thank you for taking the time for your Medicare Wellness Visit. I appreciate your continued commitment to your health goals. Please review the care plan we discussed, and feel free to reach out if I can assist you further.  Medicare recommends these wellness visits once per year to help you and your care team stay ahead of potential health issues. These visits are designed to focus on prevention, allowing your provider to concentrate on managing your acute and chronic conditions during your regular appointments.  Please note that Annual Wellness Visits do not include a physical exam. Some assessments may be limited, especially if the visit was conducted virtually. If needed, we may recommend a separate in-person follow-up with your provider.  Ongoing Care Seeing your primary care provider every 3 to 6 months helps us  monitor your health and provide consistent, personalized care.   Referrals If a referral was made during today's visit and you haven't received any updates within two weeks, please contact the referred provider directly to check on the status.  Recommended Screenings:  Health Maintenance  Topic Date Due   Zoster (Shingles) Vaccine (1 of 2) Never done   Breast Cancer Screening  03/07/2022   Flu Shot  09/06/2023   COVID-19 Vaccine (7 - 2025-26 season) 10/07/2023   Complete foot exam   12/24/2023   Hemoglobin A1C  12/27/2023   Eye exam for diabetics  02/26/2024   Yearly kidney function blood test for diabetes  06/25/2024   Yearly kidney health urinalysis for diabetes  06/25/2024   DTaP/Tdap/Td vaccine (3 - Td or Tdap) 09/23/2024   Medicare Annual Wellness Visit  10/17/2024   Colon Cancer Screening  05/21/2027   Pneumococcal Vaccine for age over 72  Completed   DEXA scan (bone density measurement)  Completed   Hepatitis C Screening  Completed   HPV Vaccine  Aged Out   Meningitis B Vaccine  Aged Out   Hepatitis B Vaccine  Discontinued       10/18/2023     8:22 AM  Advanced Directives  Does Patient Have a Medical Advance Directive? Yes  Type of Estate agent of [place];Living will  Copy of Healthcare Power of Attorney in Chart? No - copy requested   Advance Care Planning is important because it: Ensures you receive medical care that aligns with your values, goals, and preferences. Provides guidance to your family and loved ones, reducing the emotional burden of decision-making during critical moments.  Vision: Annual vision screenings are recommended for early detection of glaucoma, cataracts, and diabetic retinopathy. These exams can also reveal signs of chronic conditions such as diabetes and high blood pressure.  Dental: Annual dental screenings help detect early signs of oral cancer, gum disease, and other conditions linked to overall health, including heart disease and diabetes.  Please see the attached documents for additional preventive care recommendations.

## 2023-10-18 NOTE — Progress Notes (Signed)
 Subjective:   Morgan [name] is a 73 y.o. who presents for a Medicare Wellness preventive visit.  As a reminder, Annual Wellness Visits don't include a physical exam, and some assessments may be limited, especially if this visit is performed virtually. We may recommend an in-person follow-up visit with your provider if needed.  Visit Complete: Virtual I connected with  Morgan [name] on 10/18/23 by a audio enabled telemedicine application and verified that I am speaking with the correct person using two identifiers.  Patient Location: Home  Provider Location: Home Office  I discussed the limitations of evaluation and management by telemedicine. The patient expressed understanding and agreed to proceed.  Vital Signs: Because this visit was a virtual/telehealth visit, some criteria may be missing or patient reported. Any vitals not documented were not able to be obtained and vitals that have been documented are patient reported.    Persons Participating in Visit: Patient.  AWV Questionnaire: No: Patient Medicare AWV questionnaire was not completed prior to this visit.  Cardiac Risk Factors include: advanced age (>14men, >6 women);diabetes mellitus     Objective:    Today's Vitals   10/18/23 0814  Weight: 145 lb (65.8 kg)  Height: 5' 5 (1.651 m)   Body mass index is 24.13 kg/m.     10/18/2023    8:22 AM 10/12/2022    8:29 AM 11/29/2021    8:41 AM 11/16/2020    8:13 AM 01/14/2020    6:59 AM 11/11/2019    8:20 AM 09/29/2018    6:58 AM  Advanced Directives  Does Patient Have a Medical Advance Directive? Yes Yes Yes Yes Yes Yes Yes  Type of Estate agent of [place];Living will Healthcare Power of [place];Living will Healthcare Power of [place];Living will Healthcare Power of [place];Living will  Healthcare Power of [place];Living will Healthcare Power of [place];Living will  Copy of Healthcare Power of Attorney in Chart? No - copy requested No -  copy requested No - copy requested No - copy requested  No - copy requested No - copy requested      Data saved with a previous flowsheet row definition    Current Medications (verified) Outpatient Encounter Medications as of 10/18/2023  Medication Sig   acetaminophen (TYLENOL) 500 MG tablet Take 1,000 mg by mouth every 6 (six) hours as needed for moderate pain or headache.   aspirin 81 MG tablet Take 81 mg by mouth daily.     atorvastatin  (LIPITOR) 80 MG tablet TAKE 1 TABLET BY MOUTH ONCE DAILY AT 6 PM   Cholecalciferol (VITAMIN D3) 125 MCG (5000 UT) CAPS Take 5,000 Units by mouth daily.   Cranberry-Vitamin C-Vitamin E (CRANBERRY PLUS VITAMIN C) 4200-20-3 MG-MG-UNIT CAPS Take 2 capsules by mouth daily.   escitalopram  (LEXAPRO ) 10 MG tablet TAKE 1 TABLET BY MOUTH AT BEDTIME (Patient taking differently: Take 5 mg by mouth at bedtime.)   ezetimibe  (ZETIA ) 10 MG tablet TAKE 1 TABLET BY MOUTH ONCE DAILY   Ferrous Sulfate (IRON) 325 (65 Fe) MG TABS Take 650 mg by mouth daily.    glucose blood (ONETOUCH ULTRA) test strip Use to test blood sugar two to three times daily.   INVOKANA  300 MG TABS tablet TAKE 1 TABLET BY MOUTH EVERY DAY BEFORE BREAKFAST   Lancet Devices (ONE TOUCH DELICA LANCING DEV) MISC Use as directed to check blood sugars twice to four time per day. Dx E11.9   Lancets (ONETOUCH DELICA PLUS LANCET33G) MISC USE TO CHECK BLOOD SUGAR 2-3  TIMES DAILY   levothyroxine  (SYNTHROID ) 50 MCG tablet TAKE 1 TABLET BY MOUTH ONCE DAILY   LORazepam  (ATIVAN ) 0.5 MG tablet Take 1-2 tabs    ( 0.5-1 mg) pre procedure   metFORMIN  (GLUCOPHAGE -XR) 500 MG 24 hr tablet TAKE 2 TABLETS BY MOUTH TWICE DAILY WITH MEALS   Multiple Vitamins-Minerals (MULTIVITAMIN PO) Take 1 tablet by mouth daily.    Omega-3 Fatty Acids (FISH OIL) 1000 MG CAPS Take 2,000 mg by mouth daily.   omeprazole  (PRILOSEC) 40 MG capsule TAKE 1 CAPSULE BY MOUTH 2 TIMES DAILY   PFIZER-BIONT COVID-19 VAC-TRIS SUSP injection    tirzepatide   (MOUNJARO ) 10 MG/0.5ML Pen Inject 10 mg into the skin once a week.   tretinoin (RETIN-A) 0.025 % cream Apply topically.   vitamin B-12 (CYANOCOBALAMIN ) 1000 MCG tablet Take 2,000 mcg by mouth daily.   No facility-administered encounter medications on file as of 10/18/2023.    Allergies (verified) Penicillins   History: Past Medical History:  Diagnosis Date   Anemia    as a child    Arthritis    CAD, NATIVE VESSEL 05/13/2009   Cataracts, bilateral    MD just watching   Complication of anesthesia    DIABETES MELLITUS, TYPE II 12/09/2009   Gastric ulcer    Gastritis    GERD (gastroesophageal reflux disease)    Hx of abnormal cervical Pap smear    cryo  but normal since then    HYPERLIPIDEMIA-MIXED 04/14/2008   Intestinal metaplasia of gastric mucosa 2012   Myocardial infarction (HCC)    2003   Obesity    Occult blood in stools    Osteopenia    OVERWEIGHT/OBESITY 05/13/2009   Thyroid  disease    Ulcer    Duodenal ulcer by x-ray 40 years ago   Varicose veins    under rx [facility] 2016   Past Surgical History:  Procedure Laterality Date   BIOPSY  09/29/2018   Procedure: BIOPSY;  Surgeon: Wilhelmenia Aloha Raddle., MD;  Location: [facility] ENDOSCOPY;  Service: Gastroenterology;;   BIOPSY  01/14/2020   Procedure: BIOPSY;  Surgeon: Wilhelmenia Aloha Raddle., MD;  Location: [facility] ENDOSCOPY;  Service: Gastroenterology;;   CESAREAN SECTION     x2   CLOSED REDUCTION PROXIMAL TIBIOFIBULAR JOINT DISLOCATON  2004   left   COLONOSCOPY     COLONOSCOPY WITH PROPOFOL  N/A 09/29/2018   Procedure: COLONOSCOPY WITH PROPOFOL ;  Surgeon: Wilhelmenia Aloha Raddle., MD;  Location: [facility] ENDOSCOPY;  Service: Gastroenterology;  Laterality: N/A;   DILATION AND CURETTAGE OF UTERUS     ECTOPIC PREGNANCY SURGERY     ESOPHAGOGASTRODUODENOSCOPY N/A 02/12/2018   Procedure: ESOPHAGOGASTRODUODENOSCOPY (EGD);  Surgeon: Wilhelmenia Aloha Raddle., MD;  Location: [facility] ENDOSCOPY;  Service: Gastroenterology;  Laterality: N/A;    ESOPHAGOGASTRODUODENOSCOPY N/A 09/29/2018   Procedure: ESOPHAGOGASTRODUODENOSCOPY (EGD);  Surgeon: Wilhelmenia Aloha Raddle., MD;  Location: [facility] ENDOSCOPY;  Service: Gastroenterology;  Laterality: N/A;   ESOPHAGOGASTRODUODENOSCOPY (EGD) WITH PROPOFOL  N/A 01/14/2020   Procedure: ESOPHAGOGASTRODUODENOSCOPY (EGD) WITH PROPOFOL ;  Surgeon: Wilhelmenia Aloha Raddle., MD;  Location: [facility] ENDOSCOPY;  Service: Gastroenterology;  Laterality: N/A;   EUS N/A 02/12/2018   Procedure: UPPER ENDOSCOPIC ULTRASOUND (EUS) RADIAL;  Surgeon: Wilhelmenia Aloha Raddle., MD;  Location: [facility] ENDOSCOPY;  Service: Gastroenterology;  Laterality: N/A;   FIBULA FRACTURE SURGERY     GANGLION CYST EXCISION     POLYPECTOMY  02/12/2018   Procedure: POLYPECTOMY;  Surgeon: Wilhelmenia Aloha Raddle., MD;  Location: [facility] ENDOSCOPY;  Service: Gastroenterology;;   POLYPECTOMY  01/14/2020   Procedure: POLYPECTOMY;  Surgeon: Wilhelmenia Aloha Raddle., MD;  Location[facility] ENDOSCOPY;  Service: Gastroenterology;;   TONSILLECTOMY     TUBAL LIGATION     One Tube   UPPER GASTROINTESTINAL ENDOSCOPY     08/27/17   Family History  Problem Relation Age of Onset   Emphysema Mother        Smoker   Heart attack Mother        due to head injury   Alcohol abuse Mother    Heart disease Mother    Early death Mother    Varicose Veins Mother    AAA (abdominal aortic aneurysm) Mother    Lung cancer Father    Heart attack Father    Hypertension Father    Alcohol abuse Father    Heart disease Father        before age 98   Aneurysm Father    Hyperlipidemia Father    Alcohol abuse Brother    Diabetes Brother    Heart disease Brother    Heart attack Brother    Diabetes Brother    Aneurysm Paternal Grandmother    Diabetes Other        grandfather   Sudden Cardiac Death Brother    Colon cancer Neg Hx    Esophageal cancer Neg Hx    Stomach cancer Neg Hx    Inflammatory bowel disease Neg Hx    Liver disease Neg Hx    Pancreatic cancer Neg Hx    Rectal cancer  Neg Hx    Social History   Socioeconomic History   Marital status: Widowed    Spouse name: Not on file   Number of children: 2   Years of education: phd    Highest education level: Not on file  Occupational History   Occupation: Mental Health Counselor    Comment: [employer]   Occupation: Glass blower/designer: [employer]  Tobacco Use   Smoking status: Former    Current packs/day: 0.00    Types: Cigarettes    Quit date: 02/05/1994    Years since quitting: 29.7   Smokeless tobacco: Never  Vaping Use   Vaping status: Never Used  Substance and Sexual Activity   Alcohol use: No    Alcohol/week: 0.0 standard drinks of alcohol   Drug use: No   Sexual activity: Not on file  Other Topics Concern   Not on file  Social History Narrative   HH of 2 currently    No tobacco    Separated     Recently widowed   5 14       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    No firearms    NO reg exercise    Pets- Poodles 2    Sleep 7 hours    Social Drivers of Corporate investment banker Strain: Low Risk  (10/18/2023)   Overall Financial Resource Strain (CARDIA)    Difficulty of Paying Living Expenses: Not hard at all  Food Insecurity: No Food Insecurity (10/18/2023)   Hunger Vital Sign    Worried About Running Out of Food in the Last Year: Never true    Ran Out of Food in the Last Year: Never true  Transportation Needs: No Transportation Needs (10/18/2023)   PRAPARE - Administrator, Civil Service (Medical): No    Lack of Transportation (Non-Medical):  No  Physical Activity: Insufficiently Active (10/18/2023)   Exercise Vital Sign    Days of Exercise per Week: 5 days    Minutes of Exercise per Session: 20 min  Stress: No Stress Concern Present (10/18/2023)   [employer] of Occupational Health - Occupational Stress Questionnaire    Feeling of Stress: Not at all   Social Connections: Moderately Integrated (10/18/2023)   Social Connection and Isolation Panel    Frequency of Communication with Friends and Family: More than three times a week    Frequency of Social Gatherings with Friends and Family: More than three times a week    Attends Religious Services: More than 4 times per year    Active Member of [employer] or Organizations: Yes    Attends Banker Meetings: More than 4 times per year    Marital Status: Widowed    Tobacco Counseling Counseling given: Not Answered    Clinical Intake:  Pre-visit preparation completed: Yes  Pain : No/denies pain     BMI - recorded: 24.13 Nutritional Status: BMI of 19-24  Normal Nutritional Risks: None Diabetes: Yes CBG done?: Yes (CBG 165 Per patient) CBG resulted in Enter/ Edit results?: Yes Did pt. bring in CBG monitor from home?: No  Lab Results  Component Value Date   HGBA1C 6.7 (H) 06/26/2023   HGBA1C 7.0 (A) 03/25/2023   HGBA1C 8.8 (A) 12/24/2022     How often do you need to have someone help you when you read instructions, pamphlets, or other written materials from your doctor or pharmacy?: 1 - Never  Interpreter Needed?: No  Information entered by :: Rojelio Blush LPN   Activities of Daily Living     10/18/2023    8:20 AM  In your present state of health, do you have any difficulty performing the following activities:  Hearing? 0  Vision? 0  Difficulty concentrating or making decisions? 0  Walking or climbing stairs? 0  Dressing or bathing? 0  Doing errands, shopping? 0  Preparing Food and eating ? N  Using the Toilet? N  In the past six months, have you accidently leaked urine? N  Do you have problems with loss of bowel control? N  Managing your Medications? N  Managing your Finances? N  Housekeeping or managing your Housekeeping? N    Patient Care Team: Panosh, Apolinar POUR, MD as PCP - General (Internal Medicine) Pietro Redell RAMAN, MD as PCP - Cardiology  (Cardiology) [place], Amy, MD as Consulting Physician (Dermatology) Mat Browning, MD as Attending Physician (Obstetrics and Gynecology) Pietro Redell RAMAN, MD as Consulting Physician (Cardiology) [name], Kavitha V, MD as Consulting Physician (Gastroenterology) Joshua Rojelio, MD (Psychiatry) Leslee Reusing, MD as Consulting Physician (Ophthalmology)  I have updated your Care Teams any recent Medical Services you may have received from other providers in the past year.     Assessment:   This is a routine wellness examination for [facility]  Hearing/Vision screen Hearing Screening - Comments:: Denies hearing difficulties   Vision Screening - Comments:: Wears rx glasses - up to date with routine eye exams with  Dr Godfrey   Goals Addressed               This Visit's Progress     Continue physical activity (pt-stated)        Remain active       Depression Screen     10/18/2023    8:19 AM 10/12/2022    8:26 AM 02/20/2022    7:09  AM 11/29/2021    8:38 AM 08/14/2021    9:08 AM 01/23/2021    8:46 AM 11/16/2020    8:11 AM  PHQ 2/9 Scores  PHQ - 2 Score 0 0 0 0 0 0 0  PHQ- 9 Score     0 2     Fall Risk     10/18/2023    8:21 AM 10/12/2022    8:15 AM 02/20/2022    7:09 AM 11/29/2021    8:41 AM 08/14/2021    9:08 AM  Fall Risk   Falls in the past year? 1 0 0 0 0  Number falls in past yr: 0 0 0 0 0  Injury with Fall? 0 0 0 0 0  Risk for fall due to : No Fall Risks  No Fall Risks No Fall Risks No Fall Risks  Follow up Falls evaluation completed  Falls evaluation completed  Falls prevention discussed  Falls prevention discussed      Data saved with a previous flowsheet row definition    MEDICARE RISK AT HOME:  Medicare Risk at Home Any stairs in or around the home?: Yes If so, are there any without handrails?: No Home free of loose throw rugs in walkways, pet beds, electrical cords, etc?: Yes Adequate lighting in your home to reduce risk of falls?: Yes Life alert?:  No Use of a cane, walker or w/c?: No Grab bars in the bathroom?: Yes Shower chair or bench in shower?: Yes Elevated toilet seat or a handicapped toilet?: No  TIMED UP AND GO:  Was the test performed?  No  Cognitive Function: 6CIT completed        10/18/2023    8:23 AM 10/12/2022    8:29 AM 11/29/2021    8:41 AM 11/16/2020    8:16 AM 11/11/2019    8:24 AM  6CIT Screen  What Year? 0 points 0 points 0 points 0 points 0 points  What month? 0 points 0 points 0 points 0 points 0 points  What time? 0 points 0 points 0 points 0 points   Count back from 20 0 points 0 points 0 points 0 points 0 points  Months in reverse 0 points 0 points 0 points 0 points 0 points  Repeat phrase 0 points 0 points 0 points 0 points 0 points  Total Score 0 points 0 points 0 points 0 points     Immunizations Immunization History  Administered Date(s) Administered   Fluad Quad(high Dose 65+) 10/03/2018, 11/27/2019, 11/15/2020   Fluad Trivalent(High Dose 65+) 10/17/2022   Hep A / Hep B 04/14/2010, 05/19/2010, 10/27/2010   INFLUENZA, HIGH DOSE SEASONAL PF 12/20/2016   Influenza Split 10/27/2010   Influenza,inj,Quad PF,6+ Mos 12/12/2015   PFIZER(Purple Top)SARS-COV-2 Vaccination 03/14/2019, 04/06/2019, 11/01/2019   Pfizer Covid-19 Vaccine Bivalent Booster 85yrs & up 11/26/2020   Pneumococcal Conjugate-13 07/09/2016   Pneumococcal Polysaccharide-23 10/27/2004, 04/14/2010, 07/24/2019   RSV,unspecified 12/24/2021   Td 07/08/2004   Tdap 09/24/2014   Unspecified SARS-COV-2 Vaccination 06/18/2020, 12/24/2021    Screening Tests Health Maintenance  Topic Date Due   Zoster Vaccines- Shingrix (1 of 2) Never done   Mammogram  03/07/2022   Influenza Vaccine  09/06/2023   COVID-19 Vaccine (7 - 2025-26 season) 10/07/2023   FOOT EXAM  12/24/2023   HEMOGLOBIN A1C  12/27/2023   OPHTHALMOLOGY EXAM  02/26/2024   Diabetic kidney evaluation - eGFR measurement  06/25/2024   Diabetic kidney evaluation - Urine ACR   06/25/2024   DTaP/Tdap/Td (  3 - Td or Tdap) 09/23/2024   Medicare Annual Wellness (AWV)  10/17/2024   Colonoscopy  05/21/2027   Pneumococcal Vaccine: 50+ Years  Completed   DEXA SCAN  Completed   Hepatitis C Screening  Completed   HPV VACCINES  Aged Out   Meningococcal B Vaccine  Aged Out   Hepatitis B Vaccines 19-59 Average Risk  Discontinued    Health Maintenance Items Addressed: Mammogram deferred  Additional Screening:  Vision Screening: Recommended annual ophthalmology exams for early detection of glaucoma and other disorders of the eye. Is the patient up to date with their annual eye exam?  Yes  Who is the provider or what is the name of the office in which the patient attends annual eye exams? [facility].  Dental Screening: Recommended annual dental exams for proper oral hygiene  Community Resource Referral / Chronic Care Management: CRR required this visit?  No   CCM required this visit?  No   Plan:    I have personally reviewed and noted the following in the patient's chart:   Medical and social history Use of alcohol, tobacco or illicit drugs  Current medications and supplements including opioid prescriptions. Patient is not currently taking opioid prescriptions. Functional ability and status Nutritional status Physical activity Advanced directives List of other physicians Hospitalizations, surgeries, and ER visits in previous 12 months Vitals Screenings to include cognitive, depression, and falls Referrals and appointments  In addition, I have reviewed and discussed with patient certain preventive protocols, quality metrics, and best practice recommendations. A written personalized care plan for preventive services as well as general preventive health recommendations were provided to patient.   Rojelio LELON Blush, LPN   [id number]   After Visit Summary: (MyChart) Due to this being a telephonic visit, the after visit summary with patients personalized  plan was offered to patient via MyChart   Notes: Nothing significant to report at this time.

## 2023-10-29 ENCOUNTER — Other Ambulatory Visit: Payer: Self-pay | Admitting: Internal Medicine

## 2023-10-30 DIAGNOSIS — H903 Sensorineural hearing loss, bilateral: Secondary | ICD-10-CM | POA: Diagnosis not present

## 2023-11-08 ENCOUNTER — Other Ambulatory Visit: Payer: Self-pay | Admitting: Internal Medicine

## 2023-11-20 ENCOUNTER — Ambulatory Visit: Admitting: Internal Medicine

## 2023-11-20 ENCOUNTER — Telehealth: Payer: Self-pay | Admitting: Pharmacy Technician

## 2023-11-20 ENCOUNTER — Other Ambulatory Visit (HOSPITAL_COMMUNITY): Payer: Self-pay

## 2023-11-20 ENCOUNTER — Encounter: Payer: Self-pay | Admitting: Internal Medicine

## 2023-11-20 VITALS — BP 110/78 | HR 76 | Ht 65.0 in | Wt 145.0 lb

## 2023-11-20 DIAGNOSIS — Z7985 Long-term (current) use of injectable non-insulin antidiabetic drugs: Secondary | ICD-10-CM | POA: Diagnosis not present

## 2023-11-20 DIAGNOSIS — E1159 Type 2 diabetes mellitus with other circulatory complications: Secondary | ICD-10-CM

## 2023-11-20 DIAGNOSIS — Z7984 Long term (current) use of oral hypoglycemic drugs: Secondary | ICD-10-CM

## 2023-11-20 LAB — POCT GLYCOSYLATED HEMOGLOBIN (HGB A1C): Hemoglobin A1C: 7 % — AB (ref 4.0–5.6)

## 2023-11-20 MED ORDER — TIRZEPATIDE 12.5 MG/0.5ML ~~LOC~~ SOAJ: 12.5000 mg | SUBCUTANEOUS | 3 refills | Status: DC

## 2023-11-20 NOTE — Progress Notes (Signed)
 Name: Morgan [name]  MRN/ DOB: [id number], 14-Apr-1950   Age/ Sex: 73 y.o., female    PCP: Charlett Apolinar POUR, MD   Reason for Endocrinology Evaluation: Type 2 Diabetes Mellitus     Date of Initial Endocrinology Visit: 12/24/2022    PATIENT IDENTIFIER: [name] is a 73 y.o. female with a past medical history of DM, CAD, dyslipidemia. The patient presented for initial endocrinology clinic visit on 12/24/2022 for consultative assistance with her diabetes management.    HPI: Morgan [name] was    Diagnosed with DM 2004 Prior Medications tried/Intolerance: Byetta -hyperglycemia, Trulicity-hyperglycemia, Rybelsus -hyperglycemia, Victoza -hypoglycemia, Ozempic -hyperglycemia.  Started Mounjaro  10/2022              Hemoglobin A1c has ranged from 6.7% in 2022, peaking at 8.5% in 2024.   Maternal grandfather with DM , brothers with DM   On her initial visit to our clinic she had an A1c of 8.8%, she was on metformin , Invokana , and Mounjaro .  I increase Mounjaro  and started her on glimepiride    I discontinued glimepiride   by June, 2025 with an A1c of 6.7%   SUBJECTIVE:   During the last visit (07/22/2023): A1c 6.7%    Today (11/20/23): Morgan [name] is here for follow-up on diabetes management.  She checks her blood sugars 1 times daily. The patient has not had hypoglycemic episodes since the last clinic visit.   Patient had a follow-up with GI for chronic gastritis with gastric ulcer, intestinal metaplasia, s/p  removal of 15 mm gastric prepyloric nodule with atypia.  Patient continues weight loss No constipation  She does believe she with open mouth, because she wakes up in the morning with dry mouth Denies nausea or vomiting    HOME DIABETES REGIMEN: Metformin  500 mg XR, 2 tabs BID Invokana  300 mg daily Mounjaro  10 mg weekly   Statin: Yes ACE-I/ARB: No   METER DOWNLOAD SUMMARY: Date range evaluated: 9/16-10/15/2025 Fingerstick Blood Glucose Tests = 28 Average  Number Tests/Day = 1 Overall Mean FS Glucose = 148 Standard Deviation = 11  BG Ranges: Low = 129 High = 173   Hypoglycemic Events/30 Days: BG < 50 = 0 Episodes of symptomatic severe hypoglycemia = 0   DIABETIC COMPLICATIONS: Microvascular complications:  Denies: CKD, DR, neuropathy  Last eye exam: Completed 02/26/2023  Macrovascular complications:  CAD Denies:  PVD, CVA   PAST HISTORY: Past Medical History:  Past Medical History:  Diagnosis Date   Anemia    as a child    Arthritis    CAD, NATIVE VESSEL 05/13/2009   Cataracts, bilateral    MD just watching   Complication of anesthesia    DIABETES MELLITUS, TYPE II 12/09/2009   Gastric ulcer    Gastritis    GERD (gastroesophageal reflux disease)    Hx of abnormal cervical Pap smear    cryo  but normal since then    HYPERLIPIDEMIA-MIXED 04/14/2008   Intestinal metaplasia of gastric mucosa 2012   Myocardial infarction (HCC)    2003   Obesity    Occult blood in stools    Osteopenia    OVERWEIGHT/OBESITY 05/13/2009   Thyroid  disease    Ulcer    Duodenal ulcer by x-ray 40 years ago   Varicose veins    under rx lawson 2016   Past Surgical History:  Past Surgical History:  Procedure Laterality Date   BIOPSY  09/29/2018   Procedure: BIOPSY;  Surgeon: Wilhelmenia Aloha Raddle., MD;  Location: [facility] ENDOSCOPY;  Service: Gastroenterology;;  BIOPSY  01/14/2020   Procedure: BIOPSY;  Surgeon: Wilhelmenia Aloha Raddle., MD;  Location: [facility] ENDOSCOPY;  Service: Gastroenterology;;   CESAREAN SECTION     x2   CLOSED REDUCTION PROXIMAL TIBIOFIBULAR JOINT DISLOCATON  2004   left   COLONOSCOPY     COLONOSCOPY WITH PROPOFOL  N/A 09/29/2018   Procedure: COLONOSCOPY WITH PROPOFOL ;  Surgeon: Wilhelmenia Aloha Raddle., MD;  Location: [facility] ENDOSCOPY;  Service: Gastroenterology;  Laterality: N/A;   DILATION AND CURETTAGE OF UTERUS     ECTOPIC PREGNANCY SURGERY     ESOPHAGOGASTRODUODENOSCOPY N/A 02/12/2018   Procedure:  ESOPHAGOGASTRODUODENOSCOPY (EGD);  Surgeon: Wilhelmenia Aloha Raddle., MD;  Location: [facility] ENDOSCOPY;  Service: Gastroenterology;  Laterality: N/A;   ESOPHAGOGASTRODUODENOSCOPY N/A 09/29/2018   Procedure: ESOPHAGOGASTRODUODENOSCOPY (EGD);  Surgeon: Wilhelmenia Aloha Raddle., MD;  Location: [facility] ENDOSCOPY;  Service: Gastroenterology;  Laterality: N/A;   ESOPHAGOGASTRODUODENOSCOPY (EGD) WITH PROPOFOL  N/A 01/14/2020   Procedure: ESOPHAGOGASTRODUODENOSCOPY (EGD) WITH PROPOFOL ;  Surgeon: Wilhelmenia Aloha Raddle., MD;  Location: [facility] ENDOSCOPY;  Service: Gastroenterology;  Laterality: N/A;   EUS N/A 02/12/2018   Procedure: UPPER ENDOSCOPIC ULTRASOUND (EUS) RADIAL;  Surgeon: Wilhelmenia Aloha Raddle., MD;  Location: [facility] ENDOSCOPY;  Service: Gastroenterology;  Laterality: N/A;   FIBULA FRACTURE SURGERY     GANGLION CYST EXCISION     POLYPECTOMY  02/12/2018   Procedure: POLYPECTOMY;  Surgeon: Mansouraty, Aloha Raddle., MD;  Location: [facility] ENDOSCOPY;  Service: Gastroenterology;;   POLYPECTOMY  01/14/2020   Procedure: POLYPECTOMY;  Surgeon: Wilhelmenia Aloha Raddle., MD;  Location: [facility] ENDOSCOPY;  Service: Gastroenterology;;   TONSILLECTOMY     TUBAL LIGATION     One Tube   UPPER GASTROINTESTINAL ENDOSCOPY     08/27/17    Social History:  reports that she quit smoking about 29 years ago. Her smoking use included cigarettes. She has never used smokeless tobacco. She reports that she does not drink alcohol and does not use drugs. Family History:  Family History  Problem Relation Age of Onset   Emphysema Mother        Smoker   Heart attack Mother        due to head injury   Alcohol abuse Mother    Heart disease Mother    Early death Mother    Varicose Veins Mother    AAA (abdominal aortic aneurysm) Mother    Lung cancer Father    Heart attack Father    Hypertension Father    Alcohol abuse Father    Heart disease Father        before age 58   Aneurysm Father    Hyperlipidemia Father    Alcohol abuse Brother    Diabetes  Brother    Heart disease Brother    Heart attack Brother    Diabetes Brother    Aneurysm Paternal Grandmother    Diabetes Other        grandfather   Sudden Cardiac Death Brother    Colon cancer Neg Hx    Esophageal cancer Neg Hx    Stomach cancer Neg Hx    Inflammatory bowel disease Neg Hx    Liver disease Neg Hx    Pancreatic cancer Neg Hx    Rectal cancer Neg Hx      HOME MEDICATIONS: Allergies as of 11/20/2023       Reactions   Penicillins Rash   Did it involve swelling of the face/tongue/throat, SOB, or low BP? No Did it involve sudden or severe rash/hives, skin peeling, or any reaction on the inside  of your mouth or nose? No Did you need to seek medical attention at a hospital or doctor's office? No When did it last happen?      childhood allergy If all above answers are "NO", may proceed with cephalosporin use.        Medication List        Accurate as of November 20, 2023 10:24 AM. If you have any questions, ask your nurse or doctor.          acetaminophen 500 MG tablet Commonly known as: TYLENOL Take 1,000 mg by mouth every 6 (six) hours as needed for moderate pain or headache.   aspirin 81 MG tablet Take 81 mg by mouth daily.   atorvastatin  80 MG tablet Commonly known as: LIPITOR TAKE 1 TABLET BY MOUTH ONCE DAILY AT 6 PM   Cranberry Plus Vitamin C 4200-20-3 MG-MG-UNIT Caps Generic drug: Cranberry-Vitamin C-Vitamin E Take 2 capsules by mouth daily.   cyanocobalamin  1000 MCG tablet Commonly known as: VITAMIN B12 Take 2,000 mcg by mouth daily.   escitalopram  10 MG tablet Commonly known as: LEXAPRO  TAKE 1 TABLET BY MOUTH AT BEDTIME What changed: how much to take   ezetimibe  10 MG tablet Commonly known as: ZETIA  TAKE 1 TABLET BY MOUTH ONCE DAILY   Fish Oil 1000 MG Caps Take 2,000 mg by mouth daily.   Invokana  300 MG Tabs tablet Generic drug: canagliflozin  TAKE 1 TABLET BY MOUTH EVERY DAY BEFORE BREAKFAST   Iron 325 (65 Fe) MG Tabs Take  650 mg by mouth daily.   levothyroxine  50 MCG tablet Commonly known as: SYNTHROID  TAKE 1 TABLET BY MOUTH ONCE DAILY   LORazepam  0.5 MG tablet Commonly known as: ATIVAN  Take 1-2 tabs    ( 0.5-1 mg) pre procedure   metFORMIN  500 MG 24 hr tablet Commonly known as: GLUCOPHAGE -XR TAKE 2 TABLETS BY MOUTH TWICE DAILY WITH MEALS   MULTIVITAMIN PO Take 1 tablet by mouth daily.   omeprazole  40 MG capsule Commonly known as: PRILOSEC TAKE 1 CAPSULE BY MOUTH 2 TIMES DAILY   ONE TOUCH DELICA LANCING DEV Misc Use as directed to check blood sugars twice to four time per day. Dx E11.9   OneTouch Delica Plus Lancet33G Misc USE TO CHECK BLOOD SUGAR 2-3 TIMES DAILY   OneTouch Ultra test strip Generic drug: glucose blood Use to test blood sugar two to three times daily.   Pfizer-BioNT COVID-19 Vac-TriS Susp injection Generic drug: COVID-19 mRNA Vac-TriS (Pfizer)   tirzepatide  10 MG/0.5ML Pen Commonly known as: MOUNJARO  Inject 10 mg into the skin once a week.   tretinoin 0.025 % cream Commonly known as: RETIN-A Apply topically.   Vitamin D3 125 MCG (5000 UT) Caps Take 5,000 Units by mouth daily.         ALLERGIES: Allergies  Allergen Reactions   Penicillins Rash    Did it involve swelling of the face/tongue/throat, SOB, or low BP? No Did it involve sudden or severe rash/hives, skin peeling, or any reaction on the inside of your mouth or nose? No Did you need to seek medical attention at a hospital or doctor's office? No When did it last happen?      childhood allergy If all above answers are "NO", may proceed with cephalosporin use.      REVIEW OF SYSTEMS: A comprehensive ROS was conducted with the patient and is negative except as per HPI    OBJECTIVE:   VITAL SIGNS: BP 110/78 (BP Location: Left Arm, Patient Position: Sitting,  Cuff Size: Normal)   Pulse 76   Ht 5' 5 (1.651 m)   Wt 145 lb (65.8 kg)   LMP  (LMP Unknown)   SpO2 98%   BMI 24.13 kg/m      Filed  Weights   11/20/23 1021  Weight: 145 lb (65.8 kg)     PHYSICAL EXAM:  General: Pt appears well and is in NAD  Lungs: Clear with good BS bilat   Heart: RRR   Extremities:  Lower extremities - No pretibial edema.   Neuro: MS is good with appropriate affect, pt is alert and Ox3    DM foot exam: 11/20/2023  The skin of the feet is intact without sores or ulcerations. The pedal pulses are 1+ on right and 1+ on left. The sensation is intact to a screening 5.07, 10 gram monofilament bilaterally    DATA REVIEWED:  Lab Results  Component Value Date   HGBA1C 6.7 (H) 06/26/2023   HGBA1C 7.0 (A) 03/25/2023   HGBA1C 8.8 (A) 12/24/2022     Latest Reference Range & Units 06/26/23 08:22  Sodium 135 - 145 mEq/L 134 (L)  Potassium 3.5 - 5.1 mEq/L 3.8  Chloride 96 - 112 mEq/L 99  CO2 19 - 32 mEq/L 24  Glucose 70 - 99 mg/dL 837 (H)  BUN 6 - 23 mg/dL 10  Creatinine 9.59 - 8.79 mg/dL 9.42  Calcium  8.4 - 10.5 mg/dL 9.2  Alkaline Phosphatase 39 - 117 U/L 56  Albumin 3.5 - 5.2 g/dL 4.4  AST 0 - 37 U/L 17  ALT 0 - 35 U/L 20  Total Protein 6.0 - 8.3 g/dL 6.4  Bilirubin, Direct 0.0 - 0.3 mg/dL 0.2  Total Bilirubin 0.2 - 1.2 mg/dL 0.8  GFR >39.99 mL/min 90.91  Total CHOL/HDL Ratio  3  Cholesterol 0 - 200 mg/dL 871  HDL Cholesterol >60.99 mg/dL 53.99  LDL (calc) 0 - 99 mg/dL 66  NonHDL  17.86  Triglycerides 0.0 - 149.0 mg/dL 20.9  VLDL 0.0 - 59.9 mg/dL 84.1    Latest Reference Range & Units 06/26/23 08:33  Creatinine,U mg/dL 44.7  Microalb, Ur mg/dL <9.2  MICROALB/CREAT RATIO 0.0 - 30.0 mg/g Unable to calculate     ASSESSMENT / PLAN / RECOMMENDATIONS:   1) Type 2 Diabetes Mellitus, Optimally  controlled, With macrovascular complications - Most recent A1c of 7.0 %. Goal A1c < 7.0 %.    -A1c remains optimal  -The patient has been on Byetta , Trulicity, Victoza , with persistent hyperglycemia - She was also on Ozempic  2 mg with an A1c of 8.3% - We discontinued glimepiride  with an  A1c of 6.7% and to prevent hypoglycemia - I have recommended increasing Mounjaro    MEDICATIONS: Continue metformin  500 mg XR, 2 tablets twice daily Continue Invokana  300 mg daily Increase Mounjaro  12.5 mg weekly  EDUCATION / INSTRUCTIONS: BG monitoring instructions: Patient is instructed to check her blood sugars 1 times a day. Call [facility] if: BG persistently < 70  I reviewed the Rule of 15 for the treatment of hypoglycemia in detail with the patient. Literature supplied.   2) Diabetic complications:  Eye: Does not have known diabetic retinopathy.  Neuro/ Feet: Does not have known diabetic peripheral neuropathy. Renal: Patient does not have known baseline CKD. She is not on an ACEI/ARB at present.  3) Dyslipidemia:  - Recent lipid panel reviewed with optimal LDL, Tg - Patient on atorvastatin  and Zetia   Follow-up in 6 months  Signed electronically by: Abby Jaralla  Sam, MD  [facility] Endocrinology  [facility] [address] [place], [place] [address] Phone: [phone] FAX: [phone]   CC: Charlett Apolinar POUR, MD [address] [place] [address] Phone: [phone]  Fax: [phone]    Return to Endocrinology clinic as below: Future Appointments  Date Time Provider Department Center  10/23/2024  8:00 AM [facility]-ANNUAL WELLNESS VISIT [facility] [address]

## 2023-11-20 NOTE — Telephone Encounter (Signed)
 Pharmacy Patient Advocate Encounter   Received notification from CoverMyMeds that prior authorization for Mounjaro  12.5MG /0.5ML auto-injectors  is required/requested.   Insurance verification completed.   The patient is insured through [facility] ADVANTAGE/RX ADVANCE.   Per test claim: PA required and submitted KEY/EOC/Request #: [id number] APPROVED from 11/20/23 to 11/19/24. Ran test claim, Copay is $0.00. This test claim was processed through [facility]- copay amounts may vary at other pharmacies due to pharmacy/plan contracts, or as the patient moves through the different stages of their insurance plan.

## 2023-11-20 NOTE — Patient Instructions (Addendum)
  Continue Metformin  500 mg XR, 2 tablets twice a day  Continue Invokana  300 mg, 1 tablet every morning  Increase  Mounjaro  12.5 mg weekly    HOW TO TREAT LOW BLOOD SUGARS (Blood sugar LESS THAN 70 MG/DL) Please follow the RULE OF 15 for the treatment of hypoglycemia treatment (when your (blood sugars are less than 70 mg/dL)   STEP 1: Take 15 grams of carbohydrates when your blood sugar is low, which includes:  3-4 GLUCOSE TABS  OR 3-4 OZ OF JUICE OR REGULAR SODA OR ONE TUBE OF GLUCOSE GEL    STEP 2: RECHECK blood sugar in 15 MINUTES STEP 3: If your blood sugar is still low at the 15 minute recheck --> then, go back to STEP 1 and treat AGAIN with another 15 grams of carbohydrates.

## 2023-12-02 ENCOUNTER — Encounter: Payer: Self-pay | Admitting: Internal Medicine

## 2023-12-05 ENCOUNTER — Other Ambulatory Visit: Payer: Self-pay

## 2023-12-05 DIAGNOSIS — E1159 Type 2 diabetes mellitus with other circulatory complications: Secondary | ICD-10-CM

## 2023-12-05 MED ORDER — TIRZEPATIDE 12.5 MG/0.5ML ~~LOC~~ SOAJ: 12.5000 mg | SUBCUTANEOUS | 3 refills | Status: AC

## 2023-12-06 ENCOUNTER — Other Ambulatory Visit: Payer: Self-pay | Admitting: Internal Medicine

## 2023-12-06 ENCOUNTER — Telehealth: Payer: Self-pay

## 2023-12-06 MED ORDER — INVOKANA 300 MG PO TABS: 300.0000 mg | ORAL_TABLET | Freq: Every day | ORAL | 3 refills | Status: DC

## 2023-12-06 NOTE — Telephone Encounter (Signed)
 Copied from CRM [id number]. Topic: Clinical - Medical Advice >> Dec 06, 2023 11:55 AM [name] wrote: Reason for CRM: pt would like to know when her last set of shingles vax was done. The pharmacy needs this information. Please call back [phone]

## 2023-12-11 DIAGNOSIS — L700 Acne vulgaris: Secondary | ICD-10-CM | POA: Diagnosis not present

## 2023-12-11 DIAGNOSIS — L219 Seborrheic dermatitis, unspecified: Secondary | ICD-10-CM | POA: Diagnosis not present

## 2023-12-11 DIAGNOSIS — L570 Actinic keratosis: Secondary | ICD-10-CM | POA: Diagnosis not present

## 2023-12-11 NOTE — Telephone Encounter (Signed)
 A message was sent to pt's mychart yesterday regarding to this.   Last read by Morgan [name] at 8:28AM on 12/10/2023.   Please see mychart message encounter.

## 2023-12-17 ENCOUNTER — Other Ambulatory Visit: Payer: Self-pay | Admitting: Internal Medicine

## 2024-01-08 DIAGNOSIS — Z01419 Encounter for gynecological examination (general) (routine) without abnormal findings: Secondary | ICD-10-CM | POA: Diagnosis not present

## 2024-01-08 DIAGNOSIS — Z1231 Encounter for screening mammogram for malignant neoplasm of breast: Secondary | ICD-10-CM | POA: Diagnosis not present

## 2024-01-08 DIAGNOSIS — Z6823 Body mass index (BMI) 23.0-23.9, adult: Secondary | ICD-10-CM | POA: Diagnosis not present

## 2024-01-15 ENCOUNTER — Telehealth: Admitting: Family Medicine

## 2024-01-15 DIAGNOSIS — J019 Acute sinusitis, unspecified: Secondary | ICD-10-CM | POA: Diagnosis not present

## 2024-01-15 DIAGNOSIS — B9689 Other specified bacterial agents as the cause of diseases classified elsewhere: Secondary | ICD-10-CM | POA: Diagnosis not present

## 2024-01-15 MED ORDER — AZITHROMYCIN 250 MG PO TABS: ORAL_TABLET | ORAL | 0 refills | Status: AC

## 2024-01-15 NOTE — Patient Instructions (Addendum)
 Comer VEAR Aran, thank you for joining [name], NP for today's virtual visit.  While this provider is not your primary care provider (PCP), if your PCP is located in our provider database this encounter information will be shared with them immediately following your visit.   A [facility] MyChart account gives you access to today's visit and all your visits, tests, and labs performed at [facility]  click here if you don't have a [facility] MyChart account or go to [email or web address]  Consent: (Patient) Comer VEAR Aran provided verbal consent for this virtual visit at the beginning of the encounter.  Current Medications:  Current Outpatient Medications:    azithromycin (ZITHROMAX) 250 MG tablet, Take 2 tablets on day 1, then 1 tablet daily on days 2 through 5, Disp: 6 tablet, Rfl: 0   acetaminophen (TYLENOL) 500 MG tablet, Take 1,000 mg by mouth every 6 (six) hours as needed for moderate pain or headache., Disp: , Rfl:    aspirin 81 MG tablet, Take 81 mg by mouth daily.  , Disp: , Rfl:    atorvastatin  (LIPITOR) 80 MG tablet, TAKE 1 TABLET BY MOUTH ONCE DAILY AT 6 PM, Disp: 90 tablet, Rfl: 3   canagliflozin  (INVOKANA ) 300 MG TABS tablet, Take 1 tablet (300 mg total) by mouth daily before breakfast., Disp: 90 tablet, Rfl: 3   Cholecalciferol (VITAMIN D3) 125 MCG (5000 UT) CAPS, Take 5,000 Units by mouth daily., Disp: , Rfl:    Cranberry-Vitamin C-Vitamin E (CRANBERRY PLUS VITAMIN C) 4200-20-3 MG-MG-UNIT CAPS, Take 2 capsules by mouth daily., Disp: , Rfl:    escitalopram  (LEXAPRO ) 10 MG tablet, TAKE 1 TABLET BY MOUTH AT BEDTIME (Patient taking differently: Take 5 mg by mouth at bedtime.), Disp: 90 tablet, Rfl: 1   ezetimibe  (ZETIA ) 10 MG tablet, TAKE 1 TABLET BY MOUTH ONCE DAILY, Disp: 90 tablet, Rfl: 2   Ferrous Sulfate (IRON) 325 (65 Fe) MG TABS, Take 650 mg by mouth daily. , Disp: , Rfl:    glucose blood (ONETOUCH ULTRA) test strip, USE TO CHECK BLOOD SUGAR 2  TO 3 TIMES DAILY, Disp: 100 strip, Rfl: 1   Lancet Devices (ONE TOUCH DELICA LANCING DEV) MISC, Use as directed to check blood sugars twice to four time per day. Dx E11.9, Disp: 1 each, Rfl: 11   Lancets (ONETOUCH DELICA PLUS LANCET33G) MISC, USE TO CHECK BLOOD SUGAR 2-3 TIMES DAILY, Disp: 100 each, Rfl: 1   levothyroxine  (SYNTHROID ) 50 MCG tablet, TAKE 1 TABLET BY MOUTH ONCE DAILY, Disp: 90 tablet, Rfl: 3   LORazepam  (ATIVAN ) 0.5 MG tablet, Take 1-2 tabs    ( 0.5-1 mg) pre procedure, Disp: 8 tablet, Rfl: 0   metFORMIN  (GLUCOPHAGE -XR) 500 MG 24 hr tablet, TAKE 2 TABLETS BY MOUTH TWICE DAILY WITH MEALS, Disp: 360 tablet, Rfl: 2   Multiple Vitamins-Minerals (MULTIVITAMIN PO), Take 1 tablet by mouth daily. , Disp: , Rfl:    Omega-3 Fatty Acids (FISH OIL) 1000 MG CAPS, Take 2,000 mg by mouth daily., Disp: , Rfl:    omeprazole  (PRILOSEC) 40 MG capsule, TAKE 1 CAPSULE BY MOUTH 2 TIMES DAILY, Disp: 180 capsule, Rfl: 0   PFIZER-BIONT COVID-19 VAC-TRIS SUSP injection, , Disp: , Rfl:    tirzepatide  (MOUNJARO ) 12.5 MG/0.5ML Pen, Inject 12.5 mg into the skin once a week., Disp: 6 mL, Rfl: 3   tretinoin (RETIN-A) 0.025 % cream, Apply topically., Disp: , Rfl:    vitamin B-12 (CYANOCOBALAMIN ) 1000 MCG tablet, Take 2,000 mcg by mouth  daily., Disp: , Rfl:    Medications ordered in this encounter:  Meds ordered this encounter  Medications   azithromycin (ZITHROMAX) 250 MG tablet    Sig: Take 2 tablets on day 1, then 1 tablet daily on days 2 through 5    Dispense:  6 tablet    Refill:  0    Supervising Provider:   LAMPTEY, PHILIP O [phone]     *If you need refills on other medications prior to your next appointment, please contact your pharmacy*  Follow-Up: Call back or seek an in-person evaluation if the symptoms worsen or if the condition fails to improve as anticipated.  [facility] [phone]  Other Instructions  - Increased rest - Increasing Fluids - Acetaminophen / ibuprofen  as needed for fever/pain.   - Saline nasal spray if congestion or if nasal passages feel dry. - Humidifying the air.    If you have been instructed to have an in-person evaluation today at a local Urgent Care facility, please use the link below. It will take you to a list of all of our available [facility], including address, phone number and hours of operation. Please do not delay care.  [facility]  If you or a family member do not have a primary care provider, use the link below to schedule a visit and establish care. When you choose a [facility] primary care physician or advanced practice provider, you gain a long-term partner in health. Find a Primary Care Provider  Learn more about [facility]'s in-office and virtual care options: [place] - Get Care Now

## 2024-01-15 NOTE — Progress Notes (Signed)
 Virtual Visit Consent   Morgan [name], you are scheduled for a virtual visit with a [facility] provider today. Just as with appointments in the office, your consent must be obtained to participate. Your consent will be active for this visit and any virtual visit you may have with one of our providers in the next 365 days. If you have a MyChart account, a copy of this consent can be sent to you electronically.  As this is a virtual visit, video technology does not allow for your provider to perform a traditional examination. This may limit your provider's ability to fully assess your condition. If your provider identifies any concerns that need to be evaluated in person or the need to arrange testing (such as labs, EKG, etc.), we will make arrangements to do so. Although advances in technology are sophisticated, we cannot ensure that it will always work on either your end or our end. If the connection with a video visit is poor, the visit may have to be switched to a telephone visit. With either a video or telephone visit, we are not always able to ensure that we have a secure connection.  By engaging in this virtual visit, you consent to the provision of healthcare and authorize for your insurance to be billed (if applicable) for the services provided during this visit. Depending on your insurance coverage, you may receive a charge related to this service.  I need to obtain your verbal consent now. Are you willing to proceed with your visit today? Morgan [name] has provided verbal consent on 01/15/2024 for a virtual visit (video or telephone). Morgan CHRISTELLA Barefoot, NP  Date: 01/15/2024 12:27 PM   Virtual Visit via Video Note   I, Morgan [name], connected with  Morgan [name]  ([id number], May 10, 1950) on 01/15/24 at 12:30 PM EST by a video-enabled telemedicine application and verified that I am speaking with the correct person using two identifiers.  Location: Patient: Virtual Visit Location  Patient: Home Provider: Virtual Visit Location Provider: Home Office   I discussed the limitations of evaluation and management by telemedicine and the availability of in person appointments. The patient expressed understanding and agreed to proceed.    History of Present Illness: Morgan [name] is a 73 y.o. who identifies as a female who was assigned female at birth, and is being seen today for sinus infection  Onset was a week ago- thought it was just a cold- seemed like a head cold starting up- but never really improved, and has progressively gotten worsen in last 1-2 days. Nasal congestion has a lot of yellow green mucus, mostly head pressure.  Associated symptoms are some sneezing and some coughing to clear post nasal drip. And mild headache  Modifying factors are tylenol- twice in a day for the last 4-5 days.  Robitussin cough syrup at night- but not so much for cough, but more for congestion aspect Denies chest pain, shortness of breath, fevers, chills, ear pain, sore throat  Exposure to sick contacts- unknown- but did visit grandchildren at week back prior to on set of symptoms COVID test: no  Vaccines: none   Problems:  Patient Active Problem List   Diagnosis Date Noted   Type 2 diabetes mellitus with hyperglycemia, without long-term current use of insulin  (HCC) 12/24/2022   Diabetes mellitus (HCC) 12/24/2022   Dyslipidemia 12/24/2022   Gastric adenoma 01/10/2018   Gastric polyp 01/10/2018   Abnormal findings on esophagogastroduodenoscopy (EGD) 01/10/2018   Iron deficiency  02/26/2017   Diabetes mellitus without complication (HCC) 08/25/2015   Breast symptom left 11/17/2013   Breast discharge left 11/17/2013   Recurrent UTI 09/25/2013   Varicose veins of bilateral lower extremities with other complications 08/31/2013   Medication management 06/20/2012   First degree AV block 01/17/2012   Dermatophytosis of foot 11/30/2011   Atrophic gastritis 08/25/2010   Gastric  dysplasia 08/25/2010   Polyp at cervical os 04/14/2010   GERD without esophagitis 03/06/2010   Adult acne 03/06/2010   GERD 03/06/2010   Other acne 03/06/2010   BLOOD IN STOOL 02/24/2010   DUODENAL ULCER, HX OF 02/24/2010   DIABETES MELLITUS, TYPE II 12/09/2009   Overweight 05/13/2009   CAD, NATIVE VESSEL 05/13/2009   HLD (hyperlipidemia) 04/14/2008    Allergies:  Allergies  Allergen Reactions   Penicillins Rash    Did it involve swelling of the face/tongue/throat, SOB, or low BP? No Did it involve sudden or severe rash/hives, skin peeling, or any reaction on the inside of your mouth or nose? No Did you need to seek medical attention at a hospital or doctor's office? No When did it last happen?      childhood allergy If all above answers are NO, may proceed with cephalosporin use.    Medications:  Current Outpatient Medications:    acetaminophen (TYLENOL) 500 MG tablet, Take 1,000 mg by mouth every 6 (six) hours as needed for moderate pain or headache., Disp: , Rfl:    aspirin 81 MG tablet, Take 81 mg by mouth daily.  , Disp: , Rfl:    atorvastatin  (LIPITOR) 80 MG tablet, TAKE 1 TABLET BY MOUTH ONCE DAILY AT 6 PM, Disp: 90 tablet, Rfl: 3   canagliflozin  (INVOKANA ) 300 MG TABS tablet, Take 1 tablet (300 mg total) by mouth daily before breakfast., Disp: 90 tablet, Rfl: 3   Cholecalciferol (VITAMIN D3) 125 MCG (5000 UT) CAPS, Take 5,000 Units by mouth daily., Disp: , Rfl:    Cranberry-Vitamin C-Vitamin E (CRANBERRY PLUS VITAMIN C) 4200-20-3 MG-MG-UNIT CAPS, Take 2 capsules by mouth daily., Disp: , Rfl:    escitalopram  (LEXAPRO ) 10 MG tablet, TAKE 1 TABLET BY MOUTH AT BEDTIME (Patient taking differently: Take 5 mg by mouth at bedtime.), Disp: 90 tablet, Rfl: 1   ezetimibe  (ZETIA ) 10 MG tablet, TAKE 1 TABLET BY MOUTH ONCE DAILY, Disp: 90 tablet, Rfl: 2   Ferrous Sulfate (IRON) 325 (65 Fe) MG TABS, Take 650 mg by mouth daily. , Disp: , Rfl:    glucose blood (ONETOUCH ULTRA) test strip,  USE TO CHECK BLOOD SUGAR 2 TO 3 TIMES DAILY, Disp: 100 strip, Rfl: 1   Lancet Devices (ONE TOUCH DELICA LANCING DEV) MISC, Use as directed to check blood sugars twice to four time per day. Dx E11.9, Disp: 1 each, Rfl: 11   Lancets (ONETOUCH DELICA PLUS LANCET33G) MISC, USE TO CHECK BLOOD SUGAR 2-3 TIMES DAILY, Disp: 100 each, Rfl: 1   levothyroxine  (SYNTHROID ) 50 MCG tablet, TAKE 1 TABLET BY MOUTH ONCE DAILY, Disp: 90 tablet, Rfl: 3   LORazepam  (ATIVAN ) 0.5 MG tablet, Take 1-2 tabs    ( 0.5-1 mg) pre procedure, Disp: 8 tablet, Rfl: 0   metFORMIN  (GLUCOPHAGE -XR) 500 MG 24 hr tablet, TAKE 2 TABLETS BY MOUTH TWICE DAILY WITH MEALS, Disp: 360 tablet, Rfl: 2   Multiple Vitamins-Minerals (MULTIVITAMIN PO), Take 1 tablet by mouth daily. , Disp: , Rfl:    Omega-3 Fatty Acids (FISH OIL) 1000 MG CAPS, Take 2,000 mg by mouth daily., Disp: ,  Rfl:    omeprazole  (PRILOSEC) 40 MG capsule, TAKE 1 CAPSULE BY MOUTH 2 TIMES DAILY, Disp: 180 capsule, Rfl: 0   PFIZER-BIONT COVID-19 VAC-TRIS SUSP injection, , Disp: , Rfl:    tirzepatide  (MOUNJARO ) 12.5 MG/0.5ML Pen, Inject 12.5 mg into the skin once a week., Disp: 6 mL, Rfl: 3   tretinoin (RETIN-A) 0.025 % cream, Apply topically., Disp: , Rfl:    vitamin B-12 (CYANOCOBALAMIN ) 1000 MCG tablet, Take 2,000 mcg by mouth daily., Disp: , Rfl:   Observations/Objective: Patient is well-developed, well-nourished in no acute distress.  Resting comfortably  at home.  Head is normocephalic, atraumatic.  No labored breathing.  Speech is clear and coherent with logical content.  Patient is alert and oriented at baseline.    Assessment and Plan:  1. Acute bacterial sinusitis (Primary)  - azithromycin (ZITHROMAX) 250 MG tablet; Take 2 tablets on day 1, then 1 tablet daily on days 2 through 5  Dispense: 6 tablet; Refill: 0   URI recommendations: - Increased rest - Increasing Fluids - Acetaminophen / ibuprofen as needed for fever/pain.   - Saline nasal spray if congestion  or if nasal passages feel dry. - Humidifying the air.     Reviewed side effects, risks and benefits of medication.    Patient acknowledged agreement and understanding of the plan.   Past Medical, Surgical, Social History, Allergies, and Medications have been Reviewed.    Follow Up Instructions: I discussed the assessment and treatment plan with the patient. The patient was provided an opportunity to ask questions and all were answered. The patient agreed with the plan and demonstrated an understanding of the instructions.  A copy of instructions were sent to the patient via MyChart unless otherwise noted below.    The patient was advised to call back or seek an in-person evaluation if the symptoms worsen or if the condition fails to improve as anticipated.    Morgan CHRISTELLA Barefoot, NP

## 2024-01-24 ENCOUNTER — Other Ambulatory Visit: Payer: Self-pay | Admitting: Internal Medicine

## 2024-02-05 ENCOUNTER — Ambulatory Visit: Admitting: Internal Medicine

## 2024-02-12 ENCOUNTER — Encounter: Payer: Self-pay | Admitting: Cardiology

## 2024-02-12 ENCOUNTER — Other Ambulatory Visit: Payer: Self-pay | Admitting: Internal Medicine

## 2024-02-13 ENCOUNTER — Other Ambulatory Visit: Payer: Self-pay | Admitting: Internal Medicine

## 2024-02-13 ENCOUNTER — Other Ambulatory Visit: Payer: Self-pay | Admitting: Cardiology

## 2024-02-28 LAB — OPHTHALMOLOGY REPORT-SCANNED

## 2024-03-18 ENCOUNTER — Ambulatory Visit: Admitting: Cardiology

## 2024-05-20 ENCOUNTER — Ambulatory Visit: Admitting: Internal Medicine

## 2024-10-23 ENCOUNTER — Ambulatory Visit
